# Patient Record
Sex: Male | Born: 1960 | Race: White | Hispanic: No | Marital: Married
Health system: Southern US, Community
[De-identification: ages and names within clinical notes are randomized; demographics above are authoritative.]

## PROBLEM LIST (undated history)

## (undated) DIAGNOSIS — C189 Malignant neoplasm of colon, unspecified: Secondary | ICD-10-CM

## (undated) DIAGNOSIS — E668 Other obesity: Secondary | ICD-10-CM

## (undated) DIAGNOSIS — E669 Obesity, unspecified: Secondary | ICD-10-CM

## (undated) DIAGNOSIS — M1990 Unspecified osteoarthritis, unspecified site: Secondary | ICD-10-CM

## (undated) DIAGNOSIS — I10 Essential (primary) hypertension: Secondary | ICD-10-CM

## (undated) DIAGNOSIS — K219 Gastro-esophageal reflux disease without esophagitis: Secondary | ICD-10-CM

## (undated) DIAGNOSIS — Z9289 Personal history of other medical treatment: Secondary | ICD-10-CM

## (undated) DIAGNOSIS — Z87442 Personal history of urinary calculi: Secondary | ICD-10-CM

## (undated) DIAGNOSIS — D869 Sarcoidosis, unspecified: Secondary | ICD-10-CM

## (undated) DIAGNOSIS — G629 Polyneuropathy, unspecified: Secondary | ICD-10-CM

## (undated) DIAGNOSIS — D709 Neutropenia, unspecified: Secondary | ICD-10-CM

## (undated) DIAGNOSIS — R109 Unspecified abdominal pain: Secondary | ICD-10-CM

## (undated) DIAGNOSIS — M545 Low back pain: Secondary | ICD-10-CM

## (undated) DIAGNOSIS — D500 Iron deficiency anemia secondary to blood loss (chronic): Secondary | ICD-10-CM

## (undated) DIAGNOSIS — G64 Other disorders of peripheral nervous system: Secondary | ICD-10-CM

## (undated) DIAGNOSIS — C4490 Unspecified malignant neoplasm of skin, unspecified: Secondary | ICD-10-CM

## (undated) DIAGNOSIS — Z9889 Other specified postprocedural states: Secondary | ICD-10-CM

## (undated) DIAGNOSIS — R112 Nausea with vomiting, unspecified: Secondary | ICD-10-CM

## (undated) HISTORY — DX: Other obesity: E66.8

## (undated) HISTORY — DX: Low back pain: M54.5

## (undated) HISTORY — DX: Sarcoidosis, unspecified: D86.9

## (undated) HISTORY — PX: CARDIAC CATHETERIZATION: SHX172

## (undated) HISTORY — DX: Obesity, unspecified: E66.9

## (undated) HISTORY — DX: Other disorders of peripheral nervous system: G64

---

## 2012-04-26 ENCOUNTER — Ambulatory Visit: Payer: Self-pay | Admitting: Cardiology

## 2012-10-14 DIAGNOSIS — D869 Sarcoidosis, unspecified: Secondary | ICD-10-CM

## 2012-10-14 HISTORY — DX: Sarcoidosis, unspecified: D86.9

## 2012-11-06 ENCOUNTER — Emergency Department: Payer: Self-pay | Admitting: Emergency Medicine

## 2012-11-06 LAB — COMPREHENSIVE METABOLIC PANEL
Albumin: 3.1 g/dL — ABNORMAL LOW (ref 3.4–5.0)
Anion Gap: 9 (ref 7–16)
BUN: 8 mg/dL (ref 7–18)
Bilirubin,Total: 0.7 mg/dL (ref 0.2–1.0)
Calcium, Total: 8.3 mg/dL — ABNORMAL LOW (ref 8.5–10.1)
Co2: 24 mmol/L (ref 21–32)
EGFR (African American): >60
EGFR (Non-African Amer.): >60
Glucose: 95 mg/dL (ref 65–99)
Osmolality: 276 (ref 275–301)
Potassium: 3 mmol/L — ABNORMAL LOW (ref 3.5–5.1)
SGPT (ALT): 29 U/L (ref 12–78)

## 2012-11-06 LAB — URINALYSIS, COMPLETE
Bacteria: NONE SEEN
Bilirubin,UR: NEGATIVE
Blood: NEGATIVE
Leukocyte Esterase: NEGATIVE
Protein: 30
Specific Gravity: 1.015 (ref 1.003–1.030)
Squamous Epithelial: <1
WBC UR: 3 /HPF (ref 0–5)

## 2012-11-06 LAB — CBC WITH DIFFERENTIAL/PLATELET
Basophil #: 0.1 10*3/uL (ref 0.0–0.1)
Basophil %: 0.6 %
Eosinophil #: 0.1 10*3/uL (ref 0.0–0.7)
Eosinophil %: 0.6 %
HCT: 39 % — ABNORMAL LOW (ref 40.0–52.0)
Lymphocyte #: 0.9 10*3/uL — ABNORMAL LOW (ref 1.0–3.6)
Lymphocyte %: 8.9 %
MCHC: 34.3 g/dL (ref 32.0–36.0)
MCV: 91 fL (ref 80–100)
Monocyte #: 1.1 x10 3/mm — ABNORMAL HIGH (ref 0.2–1.0)
Neutrophil %: 78.2 %
RBC: 4.28 10*6/uL — ABNORMAL LOW (ref 4.40–5.90)
RDW: 12.8 % (ref 11.5–14.5)
WBC: 9.7 10*3/uL (ref 3.8–10.6)

## 2012-11-20 ENCOUNTER — Inpatient Hospital Stay: Payer: Self-pay | Admitting: Internal Medicine

## 2012-11-20 LAB — CBC
HCT: 38.6 % — ABNORMAL LOW (ref 40.0–52.0)
MCH: 30.7 pg (ref 26.0–34.0)
MCHC: 34.1 g/dL (ref 32.0–36.0)
Platelet: 368 10*3/uL (ref 150–440)
RDW: 12.6 % (ref 11.5–14.5)
WBC: 8.7 10*3/uL (ref 3.8–10.6)

## 2012-11-20 LAB — CK TOTAL AND CKMB (NOT AT ARMC): CK-MB: <0.5 ng/mL — ABNORMAL LOW (ref 0.5–3.6)

## 2012-11-20 LAB — BASIC METABOLIC PANEL
Anion Gap: 7 (ref 7–16)
Calcium, Total: 8.1 mg/dL — ABNORMAL LOW (ref 8.5–10.1)
Chloride: 104 mmol/L (ref 98–107)
Co2: 27 mmol/L (ref 21–32)
Creatinine: 0.91 mg/dL (ref 0.60–1.30)
EGFR (Non-African Amer.): >60
Potassium: 3.2 mmol/L — ABNORMAL LOW (ref 3.5–5.1)
Sodium: 138 mmol/L (ref 136–145)

## 2012-11-20 LAB — HEPATIC FUNCTION PANEL A (ARMC)
Alkaline Phosphatase: 123 U/L (ref 50–136)
SGOT(AST): 24 U/L (ref 15–37)
SGPT (ALT): 38 U/L (ref 12–78)

## 2012-11-20 LAB — SEDIMENTATION RATE: Erythrocyte Sed Rate: 69 mm/hr — ABNORMAL HIGH (ref 0–20)

## 2012-11-29 ENCOUNTER — Ambulatory Visit: Payer: Self-pay | Admitting: Internal Medicine

## 2012-12-01 LAB — PATHOLOGY REPORT

## 2012-12-20 LAB — CULTURE, FUNGUS WITHOUT SMEAR

## 2012-12-21 DIAGNOSIS — D869 Sarcoidosis, unspecified: Secondary | ICD-10-CM | POA: Insufficient documentation

## 2012-12-21 DIAGNOSIS — E668 Other obesity: Secondary | ICD-10-CM

## 2012-12-21 DIAGNOSIS — E669 Obesity, unspecified: Secondary | ICD-10-CM

## 2012-12-21 HISTORY — DX: Sarcoidosis, unspecified: D86.9

## 2012-12-21 HISTORY — DX: Obesity, unspecified: E66.9

## 2012-12-21 HISTORY — DX: Other obesity: E66.8

## 2013-04-26 DIAGNOSIS — G4733 Obstructive sleep apnea (adult) (pediatric): Secondary | ICD-10-CM | POA: Insufficient documentation

## 2013-04-26 DIAGNOSIS — R21 Rash and other nonspecific skin eruption: Secondary | ICD-10-CM | POA: Insufficient documentation

## 2013-09-25 DIAGNOSIS — H04129 Dry eye syndrome of unspecified lacrimal gland: Secondary | ICD-10-CM | POA: Insufficient documentation

## 2014-05-28 DIAGNOSIS — M545 Low back pain, unspecified: Secondary | ICD-10-CM

## 2014-05-28 DIAGNOSIS — G64 Other disorders of peripheral nervous system: Secondary | ICD-10-CM

## 2014-05-28 HISTORY — DX: Other disorders of peripheral nervous system: G64

## 2014-05-28 HISTORY — DX: Low back pain, unspecified: M54.50

## 2014-11-28 ENCOUNTER — Inpatient Hospital Stay: Payer: Self-pay | Admitting: Surgery

## 2014-12-17 ENCOUNTER — Ambulatory Visit
Admit: 2014-12-17 | Disposition: A | Discharge status: Home / Self Care | Payer: Self-pay | Attending: Surgery | Admitting: Surgery

## 2015-01-02 ENCOUNTER — Other Ambulatory Visit
Admit: 2015-01-02 | Disposition: A | Discharge status: Home / Self Care | Payer: Self-pay | Attending: Surgery | Admitting: Surgery

## 2015-01-02 LAB — CBC WITH DIFFERENTIAL/PLATELET
BASOS ABS: 0.1 10*3/uL (ref 0.0–0.1)
Basophil %: 1 %
Eosinophil #: 0.1 10*3/uL (ref 0.0–0.7)
Eosinophil %: 0.8 %
HCT: 34.7 % — ABNORMAL LOW (ref 40.0–52.0)
HGB: 10.6 g/dL — ABNORMAL LOW (ref 13.0–18.0)
Lymphocyte #: 2.7 10*3/uL (ref 1.0–3.6)
Lymphocyte %: 20.7 %
MCH: 22.7 pg — ABNORMAL LOW (ref 26.0–34.0)
MCHC: 30.5 g/dL — ABNORMAL LOW (ref 32.0–36.0)
MCV: 75 fL — AB (ref 80–100)
MONOS PCT: 9.2 %
Monocyte #: 1.2 x10 3/mm — ABNORMAL HIGH (ref 0.2–1.0)
NEUTROS ABS: 8.9 10*3/uL — AB (ref 1.4–6.5)
Neutrophil %: 68.3 %
Platelet: 507 10*3/uL — ABNORMAL HIGH (ref 150–440)
RBC: 4.65 10*6/uL (ref 4.40–5.90)
RDW: 20.9 % — AB (ref 11.5–14.5)
WBC: 13 10*3/uL — ABNORMAL HIGH (ref 3.8–10.6)

## 2015-01-02 LAB — COMPREHENSIVE METABOLIC PANEL
ALK PHOS: 125 U/L
Albumin: 4 g/dL
Anion Gap: 7 (ref 7–16)
BILIRUBIN TOTAL: 0.5 mg/dL
BUN: 9 mg/dL
CO2: 22 mmol/L
Calcium, Total: 8.8 mg/dL — ABNORMAL LOW
Chloride: 109 mmol/L
Creatinine: 1.04 mg/dL
EGFR (African American): >60
EGFR (Non-African Amer.): >60
GLUCOSE: 110 mg/dL — AB
Potassium: 3.7 mmol/L
SGOT(AST): 39 U/L
SGPT (ALT): 55 U/L
Sodium: 138 mmol/L
TOTAL PROTEIN: 8.2 g/dL — AB

## 2015-01-03 ENCOUNTER — Ambulatory Visit
Admit: 2015-01-03 | Disposition: A | Discharge status: Home / Self Care | Payer: Self-pay | Attending: Surgery | Admitting: Surgery

## 2015-01-03 NOTE — Consult Note (Signed)
Brief Consult Note: Orders entered.   Comments: subacute illness with DOE, pulmonary infiltrates, adenopathy, fevers,violaceous nodules over ant leg and left triceps c/w e nodosum, elevated esr(neg RF and FANA)    Lymphoma vs sarcoid vs other malignancy vs chronic infection. ....Marland KitchenMarland Kitchen rec lung biopsy.  Electronic Signatures: Leeanne Mannan., Eugene Gavia (MD)  (Signed 11-Mar-14 17:08)  Authored: Brief Consult Note   Last Updated: 11-Mar-14 17:08 by Leeanne Mannan., Eugene Gavia (MD)

## 2015-01-03 NOTE — Consult Note (Signed)
PATIENT NAME:  [name], [name] MR#:  [id number] DATE OF BIRTH:  03-22-1961  DATE OF CONSULTATION:  11/21/2012  REFERRING PHYSICIAN:  Sona A. Posey Pronto, MD CONSULTING PHYSICIAN:  Emmaline Kluver., MD  REASON FOR CONSULTATION: Rule out connective tissue disease.   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 Over the last year, he has had shortness of breath and fatigue. He was seen by Dr. Kary Kos and then by Dr. Ubaldo Glassing, had catheterization which was unremarkable. He has had some dyspnea with exertion to where he has had to back off his work. He had a recent episode of fever with some cough in February, went to 104. He took amoxicillin, then Z-Pak and then Tamiflu. Around that time, he had fevers and sweats with some nausea and vomiting. He developed erythematous violaceous rash on his shin and over his left elbow. Has had even new rash since then. Went to the Emergency Room. Thought he had infection, erythema nodosum. He had a mildly elevated sedimentation rate, was seen by Dr. Kary Kos. Rheumatoid factor, ANA negative. He lost power recently. Had been taking doxycycline. Had to go to [place] so he would not be cold and developed more nausea and vomiting. Had a good deal of coughing. Developed erythema of the left eye, which ophthalmology said it was just from coughing, not an inflammatory in illness. With more dyspnea, he came to the Emergency Room where he had infiltrate on chest x-ray but on CT had adenopathy, both peripherally and centrally, with hilar adenopathy. He was given some steroid with improvement. Has not had fever since. Off of any antibiotic. Liver functions normal. Calcium down at 18.1. White count 8000, hemoglobin 13. Sedimentation rate 69. Oxygenation 95. Recent urinalysis showed 4 red cells, 3 white cells, protein of 13. He said he had his joints hurt when he had fever, but he has not really had any joint swelling. Has not had any numbness or tingling. Feels like his glands  in his axilla have been swelling.   PAST MEDICAL HISTORY: Hypertension, depression.   SOCIAL HISTORY: No recent cigarettes or alcohol.   FAMILY HISTORY: Negative for connective tissue disease.   REVIEW OF SYSTEMS: As above.   PHYSICAL EXAMINATION:  GENERAL: Pleasant male, no acute distress.  VITAL SIGNS: Blood pressure 133/81, pulse 90, temperature 97, oxygen 95 (not on oxygen).  SKIN: He has erythematous macules over the forearm. Almost looks like he has a  blister, but he has nodules that are violaceous over the left elbow, over the triceps on the extensor surface and then several on his shin down to his ankles and a cluster around his left knee, not particularly tender.  HEENT: Left sclera is mildly injected. Oropharynx clear.  LYMPHATIC: No cervical adenopathy. Fullness in the axilla. I do not feel a discrete node. LUNGS: Distant lung sounds.  HEART: No murmur.  ABDOMEN: No visceromegaly.  EXTREMITIES: No significant edema.  MUSCULOSKELETAL: Good range of motion of neck and shoulders. Hands, knees, ankles and toes without synovitis. Symmetric reflexes.   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 Differential with his constitutional symptoms of fevers, sweats and dyspnea would include lymphoma, other malignancy, chronic infection or sarcoid.   RECOMMENDATIONS: Will send ANCA level, but this is less likely a vasculitis. Agree with lung biopsy. I do not think other labs are going to help sort this out without tissue.   ____________________________ Emmaline Kluver., MD gwk:jm D: 11/21/2012 17:14:11 ET T: 11/21/2012 17:38:12 ET JOB#:  [id number]  cc: Emmaline Kluver., MD, <Dictator> Irven Easterly. Kary Kos, MD Javier Docker Ubaldo Glassing, MD Heinz Knuckles Blocker, MD Ovidio Hanger MD ELECTRONICALLY SIGNED 11/22/2012 16:40
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
## 2015-01-03 NOTE — Consult Note (Signed)
Impression:     54yo male w/o sig PMHX admitted with prolonged SOB and recent fevers, chills and malaise and probable drug rash.     He has had SOB and easy fatiguability for about a year.  His symptoms have worsened over the past 3 months.  His CT shows some LAN and pleural based infiltrates.  He has been started on steroids and is doing much better.    He developed a rash over the legs and arms after taking tamiflu and azithromycin.  This is likely a drug reaction rather than related to his pulmonary disease.    Dermatology to see him, but a biopsy of the skin would not likely provide evidence for the pulmonary process.  Will likely need a tissue diagnosis from the lung.    Chronic viral infections, such as Hep B, Hep C and HIV, could cause the LAN, fever and malaise.  His only risk factors are tattoos, but he has had HIV testing since having the tattoos.  Will send serologies.    Fungal infection would be unlikely, but he has had extensive traveling.  Will send fungal serology and Histo Ag. 6)      Sarcoidosis, rheumatologic or malignancy are more likely than infectious etiologies.    Would hold antibiotics for now.  Electronic Signatures: Blocker MPH, Heinz Knuckles (MD) (Signed on 11-Mar-14 15:40)  Authored   Last Updated: 11-Mar-14 15:55 by Blocker MPH, Heinz Knuckles (MD)

## 2015-01-03 NOTE — H&P (Signed)
PATIENT NAME:  [name], [name] MR#:  [id number] DATE OF BIRTH:  Jul 23, 1961  DATE OF ADMISSION:  11/20/2012  PRIMARY CARE PHYSICIAN:  Irven Easterly. Kary Kos, MD  CHIEF COMPLAINT:  Shortness of breath, cough and fever with sweats for the past 3 weeks.   HISTORY OF PRESENT ILLNESS:  The patient is a pleasant 54 year old morbidly obese Caucasian gentleman with significant history of hypertension not on any medications currently who comes to the Emergency Room after he started having about 3 to 4 weeks of cough which is mainly dry with occasional phlegm and increasing shortness of breath and arthralgias with body aches. He was diagnosed with possible bronchitis along with possible flu treated with Z-Pak. He completed the Z-Pak and thereafter, he started having a patchy papular rash over his lower extremities, was seen in the Emergency Room and was diagnosed "with erythema nodosum and possible pneumonia" and given a course of doxycycline. The patient continued to get worse in the form of his breathing was worsening to the point he was not able to complete his sentence without having bouts of coughing along with fever with sweats and arthralgias. He also continued to have more of the patchy rash on his lower extremities which spread to the upper extremities and came to the Emergency Room. Next in the ER, the patient's D-dimer was elevated to 1.73. His sats were in the lower 90s. He underwent CT scan of the chest which was negative for PE; however, it showed bilateral hilar bulky lymphadenopathy with bilateral patchy involvement of the lungs. His ESR was elevated to 69. He is being admitted for further evaluation for respiratory distress and bilateral hilar lymphadenopathy.   PAST MEDICAL HISTORY:   1.  Hypertension.  2.  Anxiety.   MEDICATIONS:  Systane Balance 1 drop to left eye 4 times a day.   ALLERGIES:  No known drug allergies.   FAMILY HISTORY:  Father died from massive CVA. Mother has an irregular heart  rhythm.   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 He does have some exposure to asbestos many years ago. He used to drink alcohol initially for a few years however quit and does not smoke. He lives with his wife.   REVIEW OF SYSTEMS:  CONSTITUTIONAL: Positive for fever, fatigue, weakness.  EYES: No blurred or double vision. Positive for inflammation in the left eye.  ENT: No tinnitus, ear pain, hearing loss.  RESPIRATORY: Positive for cough, shortness of breath.  CARDIOVASCULAR: No chest pain, orthopnea, edema. Positive for hypertension.  GASTROINTESTINAL: No nausea, vomiting, diarrhea, abdominal pain or GERD.  GENITOURINARY: No dysuria or hematuria.  ENDOCRINE: No polyuria, nocturia or thyroid problems.  HEMATOLOGY: No anemia or easy bruising.  SKIN: Positive for erythema nodosum on both the lower extremities and upper extremities.  MUSCULOSKELETAL: Positive for arthritis. No CVA or TIA.  PSYCHIATRIC: No anxiety or depression. All other systems reviewed and are negative.   PHYSICAL EXAMINATION:   GENERAL: The patient is awake, alert, oriented x 3 not in acute distress. He is morbidly obese.  VITAL SIGNS: Afebrile, pulse 76, blood pressure 145/80 and sats 94% on room air.  HEENT: Atraumatic, normocephalic. PERRLA. The patient has left eye conjunctival injection. No eye discharge noted. In the oral mucosa, no ulcers noted.  NECK: Supple. No JVD. No carotid bruit.  LUNGS: Clear to auscultation bilaterally. No rales, rhonchi, respiratory distress or labored breathing. There are decreased breath sounds in the bases.  ABDOMEN: Obese, soft,  nontender. No organomegaly. Positive bowel sounds.  NEUROLOGIC: Cranial nerves II through XII grossly intact. No motor or sensory deficit.  SKIN: Warm and dry. The patient does have erythema nodosum on both lower extremities and a few patches on the upper extremities.  PSYCHIATRIC: The patient is awake, alert, oriented x  3.   DIAGNOSTIC DATA:  CT of the lungs shows bilateral bulky hilar lymphadenopathy. There is no evidence of PE. There are patchy alveolar densities of both the lungs which may reflect infectious or inflammatory process. Cardiac enzymes first set negative. CBC is within normal limits. Basic metabolic panel is within normal limits except for potassium of 3.2. ESR is 69. D-dimer is 1.73. LFTs are within normal limits.   ASSESSMENT:   33.  A 55 year old male with history of hypertension who comes in with increasing shortness of breath, fever with sweats, cough, arthralgia, erythema nodosum, elevated ESR and CT evidence of bilateral bulky hilar/mediastinal lymphadenopathy that appears to be due to sarcoidosis. Other causes of bulky hilar lymphadenopathy need to be ruled out. We will admit the patient to the medical floor. We will check ACE level. The case was discussed with Dr. Mortimer Fries. Consider getting biopsy from erythema nodosum lesion. Again, sensitivity of it may not be much specific; however, we will get a dermatology consult. The patient may end up getting a lymph node biopsy. We will start prednisone. Oxygen if needed.  2.  History of hypertension. Monitor the patient's blood pressure. He is currently not on any medications at home. If it remains elevated, consider adding blood pressure meds.  3.  Morbid obesity.  4.  Deep vein thrombosis prophylaxis. We will give subcutaneous heparin.  5.  Further workup according to the patient's clinical course. Hospital admission plan was discussed with patient, the patient's wife and Dr. Mortimer Fries.   TIME SPENT:  45 minutes.    ____________________________ Hart Rochester Posey Pronto, MD sap:si D: 11/20/2012 21:02:00 ET T: 11/20/2012 22:20:48 ET JOB#: [id number]  cc: Irven Easterly. Kary Kos, MD Sona A. Posey Pronto, MD, <Dictator> Mariane Duval, MD   Ilda Basset MD ELECTRONICALLY SIGNED 11/23/2012 15:09

## 2015-01-03 NOTE — Discharge Summary (Signed)
PATIENT NAME:  [name], [name] MR#:  [id number] DATE OF BIRTH:  1961-05-29  DATE OF ADMISSION:  11/20/2012 DATE OF DISCHARGE:  11/22/2012  PRESENTING COMPLAINT: Shortness of breath and fever with arthralgias.   DISCHARGE DIAGNOSES:  1.  Bilateral hilar/mediastinal bulky lymphadenopathy with fever, arthralgias and erythema nodosum. Workup in progress. The patient needs further workup as an outpatient.  2.  Hypertension.  3.  Morbid obesity.   CONDITION ON DISCHARGE: Fair. Sats 92% on room air.   PROCEDURES: Skin biopsy done by Dr. Evorn Gong in dermatology, not suggestive of sarcoidosis.   CODE STATUS: Full code.   MEDICATIONS:  1.  Systane 1 drop to the left eye 4 times a day.  2.  Prednisone 60 mg daily.  3.  Amlodipine 5 mg daily.  4.  Ambien 10 mg at bedtime.   DIET: Low sodium, regular.   FOLLOWUP:  1.  Dr. Kary Kos 1 to 2 weeks.  2.  Dr. Mortimer Fries to set up outpatient bronchoscopy with hilar lymph node biopsy. The patient recommended to get in touch with Dr. Mortimer Fries if not contacted in the next few days.    CONSULTATIONS:  1.  Rheumatology, Dr. Jefm Bryant.  2.  Dr. Clayborn Bigness, infectious disease.  3.  Dermatology, Dr. Evorn Gong.  4.  Dr. Mortimer Fries, pulmonology.   LABS AT DISCHARGE: HBsAg is negative. ACE level74 units/L. Cardiac enzymes negative. CBC within normal limits. Basic metabolic panel within normal limits except potassium of 3.2. ESR of 69. D-dimer was 1.73. CT of the chest shows there is no evidence of PE. No acute thoracic aortic pathology. There is bulky mediastinal hilar and hilar and retrosternal lymphadenopathy. Patchy alveolar densities in both the lungs, which may reflect infectious vs inflammatory process. No pleural pericardial effusion.   The patient is a 54 year old, morbidly obese, Caucasian gentleman with history of hypertension, not on any medications, came to the Emergency Room with:  1.  Increasing shortness of breath, fever, arthralgias and bilateral lower extremity patchy  rash consistent with erythema nodosum. He was found to have elevated ESR. The patient underwent CT scan of the chest, which showed bilateral bulky hilar/mediastinal lymphadenopathy. Differential diagnosis was sarcoidosis and need to rule out lymphoma or other chronic infectious process. The patient received a dose of IV Solu-Medrol and then started on p.o. prednisone. He started feeling better. The patient underwent skin biopsy, which was done by Dr. Evorn Gong and was not suggestive of sarcoidosis .The patient was seen by Dr. Jefm Bryant, Dr. Clayborn Bigness and Dr. Mortimer Fries and recommendations were to get tissue diagnosis from the hilar lymph node. The patient was scheduled to get lymph node biopsy by bronchoscopy by Dr. Mortimer Fries on 03/13; however, the patient refused to get the biopsy done and wanted to do it at a later date as an outpatient. The patient will be informed of outpatient biopsy. I also recommended the patient to get in touch with Dr. Kary Kos, to get the outpatient biopsy done by bronchoscopy by pulmonology.  2.  History of hypertension. Blood pressure remained somewhat elevated, likely could be contributed because of steroids and the patient's anxious personality. P.o. Norvasc was started.  3.  Morbid obesity. Advised weight reduction.  4.  Deep vein thrombosis prophylaxis. The patient was on subcutaneous heparin. Hospital stay otherwise remained stable.   CODE STATUS: The patient remained a full code.   TIME SPENT: 40 minutes.   ____________________________ Hart Rochester Posey Pronto, MD sap:aw D: 11/23/2012 06:41:33 ET T: 11/23/2012 07:26:06 ET JOB#: [id number]  cc: Sona A. Posey Pronto,  MD, <Dictator> Ilda Basset MD ELECTRONICALLY SIGNED 11/23/2012 15:13

## 2015-01-10 ENCOUNTER — Ambulatory Visit: Admit: 2015-01-10 | Disposition: A | Discharge status: Home / Self Care | Payer: Self-pay | Admitting: Surgery

## 2015-01-12 NOTE — Discharge Summary (Signed)
PATIENT NAME:  [name], [name] MR#:  [id number] DATE OF BIRTH:  20-Jul-1961  DATE OF ADMISSION:  11/28/2014 DATE OF DISCHARGE:  12/04/2014  FINAL DIAGNOSES:  1.  Complicated perforated descending colon diverticulitis.  2.  Morbid obesity.  3.  Sarcoidosis.   PRINCIPAL PROCEDURES:  1.  CT scan of the abdomen.  2.  Sterile abdominal examination. 3.  Intravenous antibiotics.   HOSPITAL COURSE SUMMARY: The patient was admitted with significant pain, leukocytosis, and descending colon diverticulitis. Over the course of his hospitalization, he did have slow improvement. He was treated nonoperatively. He did have resolution of his colonic ileus. On the 21st, the patient continued to have some discomfort. Overall he was doing better. His white count had fallen to 12.8. On the 22nd, the patient had a bowel movement, felt like he was making progress and pain control. He was stable for discharge on the 23rd of March on course of Keflex and Flagyl for a total of 7 more days. He will follow up in the office in 1 to 2 weeks' time. He understands that he will need a colonoscopy in the future. Call with any questions or concerns. Medication reconciliation form was performed.   ____________________________ Jeannette How. Marina Gravel, MD mab:sb D: 12/05/2014 08:48:50 ET T: 12/05/2014 14:15:34 ET JOB#: [id number]  cc: Elta Guadeloupe A. Marina Gravel, MD, <Dictator> Hortencia Conradi MD ELECTRONICALLY SIGNED 12/09/2014 23:17

## 2015-01-12 NOTE — H&P (Signed)
PATIENT NAME:  [name], PAULE MR#:  [id number] DATE OF BIRTH:  01/31/1961  DATE OF ADMISSION:  11/28/2014  ADMITTING DIAGNOSES:  1. Microperforation, sigmoid diverticulitis.  2. Obesity.  3. Hypertension.  4. Sarcoidosis.   HISTORY: This is a 54 year old morbidly obese white male with a history of sarcoidosis and hypertension, who presents with a several-day history of right flank pain migrating now into his abdomen. He initially thought he had kidney stones, for which he has a history of. Workup in the Emergency Room was suggestive more of diverticulitis with microperforation seen on CT scan.   The patient denies any type of abdominal pain like this in the past. He had no nausea, no vomiting. No diarrhea. No bloody stools. No fever. No jaundice. No previous intra-abdominal operations.   ALLERGIES: FENTANYL AND MIDAZOLAM.    MEDICATIONS: Lisinopril 10 mg by mouth once a day, Lyrica 150 mg by mouth 1 cap b.i.d., omeprazole 40 mg by mouth once a day, Ambien 10 mg by mouth once a day at bedtime.   PAST MEDICAL HISTORY: Significant for hypertension, sarcoidosis, diverticulosis, kidney stones, obesity, BMI of 46.8. He receives his care for sarcoidosis at [facility].   SOCIAL HISTORY: The patient does not smoke. Does not drink. He is unemployed secondary to sarcoid and peripheral nerve problems.   FAMILY HISTORY: Significant for hypertension.   PHYSICAL EXAMINATION:  VITAL SIGNS: Temperature is 97.9, pulse 113, blood pressure is 137/88, pulse oximetry is 94% on room air.  GENERAL:  The patient is alert and oriented, in no obvious distress.  PSYCHIATRIC: Affect is normal. Facies are symmetrical. Neck is obese.  LUNGS: Clear bilaterally.  HEART: Regular rate and rhythm.  GASTROINTESTINAL: Abdomen is protuberant, soft with mild tenderness in the left lower quadrant and periumbilical region. He is also tender somewhat in the epigastrium.  EXTREMITIES: Warm and well perfused.  NEUROLOGIC  EXAMINATION: Normal.    LABORATORY VALUES: Reviewed. White count is 25.3, hemoglobin 9.7, hematocrit 31.4, platelet count 130,000. Liver function tests unremarkable. Albumin 3.0. Electrolytes are unremarkable. Glucose is 146, calcium is 8.2, lipase 46., potassium 3.7.   I personally reviewed the CT scan images on the PACS monitor. Non-contrasted CT scan was performed, which demonstrates colitis involving the distal descending colon with evidence of microperforations, suspected to be diverticular in nature. No renal stones were seen.   IMPRESSION: This is a 54 year old obese white male with hypertension and sarcoidosis, presenting with microperforation of sigmoid diverticulitis. I see no indication for acute surgical intervention at this time.  PLAN: The patient will be admitted, hydrated, started on intravenous antibiotics consisting of Rocephin and Flagyl. He will be made n.p.o. Serial abdominal examinations will undertaken. He understands the risk of needing operation, as well as ostomy, which be very difficult given his [place] appearance.  I will continue his antihypertensive po.   ____________________________ Jeannette How. Marina Gravel, MD mab:ap D: 11/29/2014 08:57:31 ET T: 11/29/2014 09:16:28 ET JOB#: [id number]  cc: Elta Guadeloupe A. Marina Gravel, MD, <Dictator> Hortencia Conradi MD ELECTRONICALLY SIGNED 11/29/2014 12:58

## 2015-01-13 ENCOUNTER — Inpatient Hospital Stay
Admission: EM | Admit: 2015-01-13 | Discharge: 2015-01-16 | DRG: 391 | Disposition: A | Discharge status: Home / Self Care | Payer: BLUE CROSS/BLUE SHIELD | Attending: Surgery | Admitting: Surgery

## 2015-01-13 ENCOUNTER — Inpatient Hospital Stay: Payer: BLUE CROSS/BLUE SHIELD

## 2015-01-13 ENCOUNTER — Encounter: Payer: Self-pay | Admitting: Emergency Medicine

## 2015-01-13 DIAGNOSIS — K651 Peritoneal abscess: Secondary | ICD-10-CM | POA: Diagnosis present

## 2015-01-13 DIAGNOSIS — Z823 Family history of stroke: Secondary | ICD-10-CM

## 2015-01-13 DIAGNOSIS — G629 Polyneuropathy, unspecified: Secondary | ICD-10-CM | POA: Diagnosis present

## 2015-01-13 DIAGNOSIS — D869 Sarcoidosis, unspecified: Secondary | ICD-10-CM | POA: Diagnosis present

## 2015-01-13 DIAGNOSIS — K5732 Diverticulitis of large intestine without perforation or abscess without bleeding: Principal | ICD-10-CM | POA: Diagnosis present

## 2015-01-13 DIAGNOSIS — G4733 Obstructive sleep apnea (adult) (pediatric): Secondary | ICD-10-CM | POA: Diagnosis present

## 2015-01-13 DIAGNOSIS — R109 Unspecified abdominal pain: Secondary | ICD-10-CM

## 2015-01-13 DIAGNOSIS — N200 Calculus of kidney: Secondary | ICD-10-CM | POA: Diagnosis present

## 2015-01-13 DIAGNOSIS — I10 Essential (primary) hypertension: Secondary | ICD-10-CM | POA: Diagnosis present

## 2015-01-13 DIAGNOSIS — K5720 Diverticulitis of large intestine with perforation and abscess without bleeding: Secondary | ICD-10-CM

## 2015-01-13 DIAGNOSIS — Z79899 Other long term (current) drug therapy: Secondary | ICD-10-CM

## 2015-01-13 DIAGNOSIS — Z452 Encounter for adjustment and management of vascular access device: Secondary | ICD-10-CM

## 2015-01-13 HISTORY — DX: Sarcoidosis, unspecified: D86.9

## 2015-01-13 HISTORY — DX: Essential (primary) hypertension: I10

## 2015-01-13 HISTORY — DX: Polyneuropathy, unspecified: G62.9

## 2015-01-13 LAB — CBC WITH DIFFERENTIAL/PLATELET
Basophils Absolute: 0.1 10*3/uL (ref 0–0.1)
Eosinophils Absolute: 0.1 10*3/uL (ref 0–0.7)
HCT: 32.4 % — ABNORMAL LOW (ref 40.0–52.0)
Hemoglobin: 10 g/dL — ABNORMAL LOW (ref 13.0–18.0)
Lymphs Abs: 1.8 10*3/uL (ref 1.0–3.6)
MCH: 22.6 pg — AB (ref 26.0–34.0)
MCHC: 31 g/dL — ABNORMAL LOW (ref 32.0–36.0)
MCV: 73 fL — ABNORMAL LOW (ref 80.0–100.0)
Monocytes Absolute: 1 10*3/uL (ref 0.2–1.0)
Monocytes Relative: 9 %
NEUTROS ABS: 9 10*3/uL — AB (ref 1.4–6.5)
Platelets: 478 10*3/uL — ABNORMAL HIGH (ref 150–440)
RBC: 4.44 MIL/uL (ref 4.40–5.90)
RDW: 20.5 % — ABNORMAL HIGH (ref 11.5–14.5)
WBC: 11.9 10*3/uL — ABNORMAL HIGH (ref 3.8–10.6)

## 2015-01-13 LAB — COMPREHENSIVE METABOLIC PANEL
ALT: 40 U/L (ref 17–63)
ANION GAP: 7 (ref 5–15)
AST: 34 U/L (ref 15–41)
Albumin: 3.8 g/dL (ref 3.5–5.0)
Alkaline Phosphatase: 101 U/L (ref 38–126)
BUN: 9 mg/dL (ref 6–20)
CHLORIDE: 107 mmol/L (ref 101–111)
CO2: 24 mmol/L (ref 22–32)
CREATININE: 0.89 mg/dL (ref 0.61–1.24)
Calcium: 8.7 mg/dL — ABNORMAL LOW (ref 8.9–10.3)
GFR calc Af Amer: >60 mL/min (ref >60)
GFR calc non Af Amer: >60 mL/min (ref >60)
Glucose, Bld: 106 mg/dL — ABNORMAL HIGH (ref 65–99)
Potassium: 3.8 mmol/L (ref 3.5–5.1)
Sodium: 138 mmol/L (ref 135–145)
TOTAL PROTEIN: 7.9 g/dL (ref 6.5–8.1)
Total Bilirubin: 0.4 mg/dL (ref 0.3–1.2)

## 2015-01-13 LAB — URINALYSIS COMPLETE WITH MICROSCOPIC (ARMC ONLY)
BILIRUBIN URINE: NEGATIVE
Bacteria, UA: NONE SEEN
GLUCOSE, UA: NEGATIVE mg/dL
HGB URINE DIPSTICK: NEGATIVE
KETONES UR: NEGATIVE mg/dL
NITRITE: NEGATIVE
PH: 6 (ref 5.0–8.0)
Protein, ur: NEGATIVE mg/dL
Specific Gravity, Urine: 1.017 (ref 1.005–1.030)

## 2015-01-13 LAB — CBC
HCT: 31.3 % — ABNORMAL LOW (ref 40.0–52.0)
HEMOGLOBIN: 9.7 g/dL — AB (ref 13.0–18.0)
MCH: 22.8 pg — AB (ref 26.0–34.0)
MCHC: 31 g/dL — ABNORMAL LOW (ref 32.0–36.0)
MCV: 73.5 fL — ABNORMAL LOW (ref 80.0–100.0)
Platelets: 452 10*3/uL — ABNORMAL HIGH (ref 150–440)
RBC: 4.26 MIL/uL — ABNORMAL LOW (ref 4.40–5.90)
RDW: 20.6 % — AB (ref 11.5–14.5)
WBC: 13.2 10*3/uL — ABNORMAL HIGH (ref 3.8–10.6)

## 2015-01-13 LAB — LIPASE, BLOOD: Lipase: 28 U/L (ref 22–51)

## 2015-01-13 LAB — CREATININE, SERUM
CREATININE: 0.94 mg/dL (ref 0.61–1.24)
GFR calc Af Amer: >60 mL/min (ref >60)

## 2015-01-13 MED ORDER — MORPHINE SULFATE 2 MG/ML IJ SOLN
2.0000 mg | INTRAMUSCULAR | Status: DC | PRN
  Administered 2015-01-13: 4 mg via INTRAVENOUS
  Filled 2015-01-13: qty 2

## 2015-01-13 MED ORDER — IOHEXOL 240 MG/ML SOLN
25.0000 mL | INTRAMUSCULAR | Status: AC
  Administered 2015-01-13 (×2): 25 mL via ORAL

## 2015-01-13 MED ORDER — HYDROMORPHONE HCL 1 MG/ML IJ SOLN
1.0000 mg | Freq: Once | INTRAMUSCULAR | Status: AC
  Administered 2015-01-13: 1 mg via INTRAVENOUS

## 2015-01-13 MED ORDER — IOHEXOL 300 MG/ML  SOLN
125.0000 mL | Freq: Once | INTRAMUSCULAR | Status: AC | PRN
  Administered 2015-01-13: 125 mL via INTRAVENOUS

## 2015-01-13 MED ORDER — ONDANSETRON HCL 4 MG/2ML IJ SOLN: 4.0000 mg | Freq: Four times a day (QID) | INTRAMUSCULAR | Status: DC | PRN

## 2015-01-13 MED ORDER — ENOXAPARIN SODIUM 40 MG/0.4ML ~~LOC~~ SOLN
40.0000 mg | SUBCUTANEOUS | Status: DC
  Administered 2015-01-13 – 2015-01-15 (×3): 40 mg via SUBCUTANEOUS
  Filled 2015-01-13 (×3): qty 0.4

## 2015-01-13 MED ORDER — HYDROMORPHONE HCL 1 MG/ML IJ SOLN
INTRAMUSCULAR | Status: AC
  Filled 2015-01-13: qty 1

## 2015-01-13 MED ORDER — KCL IN DEXTROSE-NACL 20-5-0.45 MEQ/L-%-% IV SOLN
INTRAVENOUS | Status: DC
  Administered 2015-01-13 – 2015-01-15 (×5): via INTRAVENOUS
  Filled 2015-01-13 (×12): qty 1000

## 2015-01-13 MED ORDER — ZOLPIDEM TARTRATE 5 MG PO TABS
10.0000 mg | ORAL_TABLET | Freq: Every evening | ORAL | Status: DC | PRN
  Administered 2015-01-13 – 2015-01-15 (×3): 10 mg via ORAL
  Filled 2015-01-13 (×3): qty 2

## 2015-01-13 MED ORDER — CLINDAMYCIN PHOSPHATE 600 MG/50ML IV SOLN
600.0000 mg | Freq: Three times a day (TID) | INTRAVENOUS | Status: DC
  Administered 2015-01-13 – 2015-01-16 (×8): 600 mg via INTRAVENOUS
  Filled 2015-01-13 (×12): qty 50

## 2015-01-13 MED ORDER — ONDANSETRON HCL 4 MG PO TABS: 4.0000 mg | ORAL_TABLET | Freq: Four times a day (QID) | ORAL | Status: DC | PRN

## 2015-01-13 MED ORDER — CEFTRIAXONE SODIUM IN DEXTROSE 20 MG/ML IV SOLN
1.0000 g | INTRAVENOUS | Status: DC
  Administered 2015-01-13 – 2015-01-15 (×3): 1 g via INTRAVENOUS
  Filled 2015-01-13 (×5): qty 50

## 2015-01-13 NOTE — H&P (Signed)
H&P dictated Admit for IV ABx and repeat CT scan

## 2015-01-13 NOTE — ED Provider Notes (Signed)
[facility] Emergency Department Provider Note    ____________________________________________  Time seen: Time is 1400 p.m.   HISTORY  Chief Complaint Diverticulitis       HPI [name] is a 54 y.o. male who presents with right-sided abdominal pain. Patient states she was told to come here by Dr. Felton Clinton. So he could be admitted and have IV antibiotics. He points a dull right-sided abdominal pain. But most of his abdomen is tender to touch he states. Day 8 of antibiotics and has bruising be treated with antibiotics as well for this. Movement coughing or pressure in the abdomen makes it worse. Pain is severe.     Past Medical History  Diagnosis Date  . Hypertension   . Sarcoid   . Neuropathy     There are no active problems to display for this patient.   No past surgical history on file.  Current Outpatient Rx  Name  Route  Sig  Dispense  Refill  . ciprofloxacin (CIPRO) 500 MG tablet   Oral   Take 500 mg by mouth 2 (two) times daily.         . metroNIDAZOLE (FLAGYL) 500 MG tablet   Oral   Take 500 mg by mouth 3 (three) times daily.         Marland Kitchen oxyCODONE-acetaminophen (PERCOCET/ROXICET) 5-325 MG per tablet   Oral   Take 1 tablet by mouth every 4 (four) hours as needed for severe pain.         Marland Kitchen zolpidem (AMBIEN) 10 MG tablet   Oral   Take 10 mg by mouth at bedtime as needed for sleep.           Allergies Review of patient's allergies indicates not on file.  History reviewed. No pertinent family history.  Social History History  Substance Use Topics  . Smoking status: Never Smoker   . Smokeless tobacco: Not on file  . Alcohol Use: No    Review of Systems  Constitutional: Negative for fever. Eyes: Negative for visual changes. ENT: Negative for sore throat. Cardiovascular: Negative for chest pain. Respiratory: Negative for shortness of breath. Gastrointestinal: Positive for abdominal pain, negative for vomiting and  diarrhea. Genitourinary: Negative for dysuria. Musculoskeletal: Negative for back pain. Skin: Negative for rash. Neurological: Negative for headaches, focal weakness or numbness. {  10-point ROS otherwise negative.  ____________________________________________   PHYSICAL EXAM:  VITAL SIGNS: ED Triage Vitals  Enc Vitals Group     BP 01/13/15 1030 132/84 mmHg     Pulse Rate 01/13/15 1030 98     Resp 01/13/15 1030 18     Temp 01/13/15 1030 98.4 F (36.9 C)     Temp Source 01/13/15 1030 Oral     SpO2 01/13/15 1030 97 %     Weight 01/13/15 1030 320 lb (145.151 kg)     Height 01/13/15 1030 5\' 11"  (1.803 m)     Head Cir --      Peak Flow --      Pain Score 01/13/15 1031 4     Pain Loc --      Pain Edu? --      Excl. in [place]? --      Constitutional: Alert and oriented. Well appearing and in no distress. Eyes: Conjunctivae are normal. PERRL. Normal extraocular movements. ENT   Head: Normocephalic and atraumatic.   Nose: No congestion/rhinnorhea.   Mouth/Throat: Mucous membranes are moist.   Neck: No stridor. Hematological/Lymphatic/Immunilogical: No cervical lymphadenopathy. Cardiovascular:  Normal rate, regular rhythm. Normal and symmetric distal pulses are present in all extremities. No murmurs, rubs, or gallops. Respiratory: Normal respiratory effort without tachypnea nor retractions. Breath sounds are clear and equal bilaterally. No wheezes/rales/rhonchi. Gastrointestinal: Right-sided abdominal tenderness with possibly some guarding there. Bowel sounds  Musculoskeletal: Nontender with normal range of motion in all extremities. No joint effusions.  No lower extremity tenderness nor edema. Neurologic:  Normal speech and language. No gross focal neurologic deficits are appreciated. Speech is normal. No gait instability. Skin:  Skin is warm, dry and intact. No rash noted. Psychiatric: Mood and affect are normal. Speech and behavior are normal. Patient exhibits  appropriate insight and judgment.  ____________________________________________   EKG  ED ECG REPORT   Date: 01/13/2015  EKG Time: 10:52 AM  Rate: 113  Rhythm: normal EKG, normal sinus rhythm, unchanged from previous tracings, sinus tachycardia  Axis: Normal  Intervals:none  ST&T Change: None   ____________________________________________    RADIOLOGY  None  ____________________________________________   PROCEDURES  Procedure(s) performed: None  Critical Care performed: No  ____________________________________________   INITIAL IMPRESSION / ASSESSMENT AND PLAN / ED COURSE  Pertinent labs & imaging results that were available during my care of the patient were reviewed by me and considered in my medical decision making (see chart for details).    Right-sided diverticulitis. Plan will be for consultation of surgery. Spoke with Dr. Leanora Cover on the phone who is agreed to come evaluate the patient ER. Will need admission  ____________________________________________   FINAL CLINICAL IMPRESSION(S) / ED DIAGNOSES  diverticulitis  Earleen Newport, MD 01/13/15 [id number]

## 2015-01-13 NOTE — ED Notes (Signed)
Pt reports that he was suppose to be admitted this weekend for diverticulitis but was to sick to come in, but is here to be admitted today. Pt appears pale.

## 2015-01-13 NOTE — ED Notes (Signed)
Diverticulitis.  Being txby dr bird.  Says he was told to come to ED so they can admit him for IV antibiotics.

## 2015-01-14 LAB — CBC
HCT: 28.2 % — ABNORMAL LOW (ref 40.0–52.0)
HEMOGLOBIN: 9.1 g/dL — AB (ref 13.0–18.0)
MCH: 23.7 pg — ABNORMAL LOW (ref 26.0–34.0)
MCHC: 32.2 g/dL (ref 32.0–36.0)
MCV: 73.4 fL — ABNORMAL LOW (ref 80.0–100.0)
Platelets: 407 10*3/uL (ref 150–440)
RBC: 3.84 MIL/uL — AB (ref 4.40–5.90)
RDW: 21 % — ABNORMAL HIGH (ref 11.5–14.5)
WBC: 9.8 10*3/uL (ref 3.8–10.6)

## 2015-01-14 LAB — COMPREHENSIVE METABOLIC PANEL
ALBUMIN: 3.3 g/dL — AB (ref 3.5–5.0)
ALK PHOS: 80 U/L (ref 38–126)
ALT: 33 U/L (ref 17–63)
AST: 27 U/L (ref 15–41)
Anion gap: 6 (ref 5–15)
BUN: 6 mg/dL (ref 6–20)
CHLORIDE: 108 mmol/L (ref 101–111)
CO2: 25 mmol/L (ref 22–32)
Calcium: 8.3 mg/dL — ABNORMAL LOW (ref 8.9–10.3)
Creatinine, Ser: 0.8 mg/dL (ref 0.61–1.24)
GFR calc Af Amer: >60 mL/min (ref >60)
Glucose, Bld: 115 mg/dL — ABNORMAL HIGH (ref 65–99)
Potassium: 3.7 mmol/L (ref 3.5–5.1)
SODIUM: 139 mmol/L (ref 135–145)
Total Bilirubin: 0.3 mg/dL (ref 0.3–1.2)
Total Protein: 7 g/dL (ref 6.5–8.1)

## 2015-01-14 LAB — PROTIME-INR
INR: 1.2
PROTHROMBIN TIME: 15.4 s — AB (ref 11.4–15.0)

## 2015-01-14 LAB — APTT: aPTT: 34 seconds (ref 24–36)

## 2015-01-14 MED ORDER — ACETAMINOPHEN 325 MG PO TABS
650.0000 mg | ORAL_TABLET | Freq: Four times a day (QID) | ORAL | Status: DC | PRN
  Administered 2015-01-14 – 2015-01-16 (×5): 650 mg via ORAL
  Filled 2015-01-14 (×4): qty 2

## 2015-01-14 MED ORDER — HYDRALAZINE HCL 20 MG/ML IJ SOLN: 10.0000 mg | Freq: Four times a day (QID) | INTRAMUSCULAR | Status: DC | PRN

## 2015-01-14 MED ORDER — METOPROLOL TARTRATE 25 MG PO TABS
25.0000 mg | ORAL_TABLET | Freq: Two times a day (BID) | ORAL | Status: DC
  Administered 2015-01-14: 25 mg via ORAL
  Filled 2015-01-14: qty 1

## 2015-01-14 NOTE — Progress Notes (Signed)
In urinal

## 2015-01-14 NOTE — Progress Notes (Signed)
Pt refused to be awoke for VS at night

## 2015-01-14 NOTE — H&P (Signed)
NAME:  [name], [name] NO.:  [id number]  MEDICAL RECORD NO.:  [id number]  LOCATION:  222A                         FACILITY:  [facility]  PHYSICIAN:  Molly Maduro, MD   DATE OF BIRTH:  March 16, 1961  DATE OF ADMISSION:  01/13/2015 DATE OF DISCHARGE:                            HISTORY AND PHYSICAL   HISTORY OF PRESENT ILLNESS:  [name] is a 54 year old white male who has had recurrent and/or persistent acute diverticulitis associated with small intraabdominal abscesses for a number of weeks.  He has been in and out of the hospital and in and out of the [facility] and has been advised to be admitted to the hospital last Thursday.  He took it upon himself not to take the advice of the physicians but come into the hospital on Monday instead.  Apparently, he has a CT scan which shows a pancake abscess in the right upper quadrant that is fairly thin but has enlarged in size.  Since being at home he has constant abdominal pain but his pain improves at times, and he has had a low grade fever up to 100.5 degrees Fahrenheit.  He does not have any nausea or vomiting or anorexia and he has 2 loose bowel movements per day typically.  PAST MEDICAL HISTORY: 1. Morbid obesity. 2. Sarcoidosis. 3. Hypertension.  MEDICATIONS: 1. Systane 1 drop to left eye 4 times a day. 2. Prednisone 60 mg daily. 3. Amlodipine 5 mg daily. 4. Ambien 10 mg q.h.s.  FAMILY HISTORY:  The patient's father died in his 31s of a stroke.  The patient's mother is in her mid 91s and recently had a back fracture and is now bedridden.  SOCIAL HISTORY:  The patient is disabled, he says because of 3 or 4 different problems.  He does not smoke cigarettes and never has and he does not drink alcohol.  He is married.  PHYSICAL EXAMINATION:  GENERAL:  Morbidly obese patient lying comfortably in the emergency department stretcher. VITAL SIGNS:  Temperature 98.4, pulse 94, respirations 16, blood pressure  148/79, oxygen saturation 98% on room air, weight 145 kg (320 pounds). HEENT:  Pupils equally round, reactive to light.  Extraocular movements intact.  Sclerae nonicteric.  Oropharynx clear.  Mucous membranes moist. Hearing intact to voice. NECK:  Supple with no lymphadenopathy, tracheal deviation or jugular venous distention. HEART:  Regular rate and rhythm with normal heart sounds bilaterally and no murmurs or rubs. LUNGS:  Clear to auscultation bilaterally with normal respiratory effort. ABDOMEN: Obese but nondistended and he has some tenderness on the right side particularly the right lower quadrant where he has mild rebound peritonitis.  On the left, there is no rebound.  There is no tympany. EXTREMITIES:  No edema with normal capillary refill bilaterally. NEUROLOGICAL: Cranial nerves II through XII.  Motor and sensation grossly intact. PSYCHIATRIC:  Alert and oriented x4.  Appropriate affect.  OTHER STUDIES:  White blood cell count 11.9, hemoglobin 10, hematocrit 32.4%, platelet count 478,000.  Glucose is 106.  Creatinine 0.9.  ASSESSMENT:  Recurrent or persistent diverticulitis over the course of the last 2 months or so.  PLAN:  Admit to the  hospital for IV fluids and IV antimitotics, CT scan of the abdomen and pelvis with and without IV and oral contrast.          ______________________________ Molly Maduro, MD     WM/MEDQ  D:  01/13/2015  T:  01/14/2015  Job:  [id number]

## 2015-01-14 NOTE — Progress Notes (Addendum)
Subjective: Feels better Hungry   Vital signs in last 24 hours: Temp:  [97.7 F (36.5 C)-98.5 F (36.9 C)] 98.2 F (36.8 C) (05/03 0829) Pulse Rate:  [73-94] 73 (05/03 0829) Resp:  [16-22] 19 (05/03 0829) BP: (148-170)/(60-105) 170/105 mmHg (05/03 0829) SpO2:  [98 %-100 %] 99 % (05/03 0829)    Intake/Output from previous day: 05/02 0701 - 05/03 0700 In: 1129 [IV Piggyback:1129] Out: 1000 [Urine:1000]  PERTINENT EXAM: Abdomen still tender in RLQ, but perhaps less so  Lab Results:  CBC  Recent Labs  01/13/15 1801 01/14/15 0604  WBC 13.2* 9.8  HGB 9.7* 9.1*  HCT 31.3* 28.2*  PLT 452* 407   CMP     Component Value Date/Time   NA 139 01/14/2015 0604   NA 138 01/02/2015 1555   K 3.7 01/14/2015 0604   K 3.7 01/02/2015 1555   CL 108 01/14/2015 0604   CL 109 01/02/2015 1555   CO2 25 01/14/2015 0604   CO2 22 01/02/2015 1555   GLUCOSE 115* 01/14/2015 0604   GLUCOSE 110* 01/02/2015 1555   BUN 6 01/14/2015 0604   BUN 9 01/02/2015 1555   CREATININE 0.80 01/14/2015 0604   CREATININE 1.04 01/02/2015 1555   CALCIUM 8.3* 01/14/2015 0604   CALCIUM 8.8* 01/02/2015 1555   PROT 7.0 01/14/2015 0604   PROT 8.2* 01/02/2015 1555   ALBUMIN 3.3* 01/14/2015 0604   ALBUMIN 4.0 01/02/2015 1555   AST 27 01/14/2015 0604   AST 39 01/02/2015 1555   ALT 33 01/14/2015 0604   ALT 55 01/02/2015 1555   ALKPHOS 80 01/14/2015 0604   ALKPHOS 125 01/02/2015 1555   BILITOT 0.3 01/14/2015 0604   GFRNONAA >60 01/14/2015 0604   GFRNONAA >60 01/02/2015 1555   GFRAA >60 01/14/2015 0604   GFRAA >60 01/02/2015 1555   PT/INR  Recent Labs  01/14/15 0604  LABPROT 15.4*  INR 1.20    Studies/Results: Ct Abdomen Pelvis W Contrast  01/13/2015   CLINICAL DATA:  Abdominal pain, right upper quadrant. Elevated white count. Followup CT 01/03/2015  EXAM: CT ABDOMEN AND PELVIS WITH CONTRAST  TECHNIQUE: Multidetector CT imaging of the abdomen and pelvis was performed using the standard protocol  following bolus administration of intravenous contrast.  CONTRAST:  122mL OMNIPAQUE IOHEXOL 300 MG/ML  SOLN  COMPARISON:  01/03/2015  FINDINGS: BODY WALL: No contributory findings.  LOWER CHEST: No contributory findings.  ABDOMEN/PELVIS:  Liver: No focal abnormality.  Hepatic steatosis.  Biliary: No evidence of biliary obstruction or stone.  Pancreas: Unremarkable.  Spleen: Unremarkable.  Adrenals: Unremarkable.  Kidneys and ureters: Numerous nonobstructing bilateral renal calculi, up to 5 mm in the bilateral interpolar region. No hydronephrosis or ureteral stone. Presumed sub cm cyst in the interpolar left kidney.  Bladder: Unremarkable.  Reproductive: No pathologic findings.  Bowel: Continued decrease in distal descending colon pericolonic fat inflammation, although circumferential wall thickening persists. It is also notable that there is no other diverticula. The tubular gas and fluid collection extending medially from the inflamed segment has partially decompressed. The unusual collection has unchanged morphology, extending across the midline deep to the abdominal wall and terminating in the right upper quadrant. The collection measures up to 8 mm in diameter, previously 15 mm. This collection is discrete from the appendix. No free perforation. No obstruction.  Retroperitoneum: No mass or adenopathy.  Peritoneum: No ascites or pneumoperitoneum.  Vascular: No acute abnormality.  OSSEOUS: No acute abnormalities.  IMPRESSION: 1. Partial decompression of the tubular abscess/fistula from the descending  colon, although unchanged in extent. Oral contrast has not yet reached the affected colon and is not useful in excluding ongoing communication with the colonic lumen. 2. Persistent focal wall thickening at the site of prior perforation. If surgical therapy is not planned, endoscopic follow-up is recommended. 3. Numerous bilateral renal calculi.   Electronically Signed   By: Monte Fantasia M.D.   On: 01/13/2015 21:23     Assessment/Plan: Con't IV Abx Low fiber diet Dietary consult for education IM consult for HTN, etc.

## 2015-01-14 NOTE — Consult Note (Signed)
[facility] [place] NAME: [name]    MR#:  [id number]  DATE OF BIRTH:  [birth year]  SUBJECTIVE:  CHIEF COMPLAINT:   Chief Complaint  Patient presents with  . Diverticulitis    54 y/o m with PMhx HTN who presents with abdominal pain found to have diverticulitis. Hospitalist consulted for medical management. Patient says he stopped taking his blood pressure medications 7 weeks ago when he fell ill.  No other issues. He is tolerating his diet well.  REVIEW OF SYSTEMS:  CONSTITUTIONAL: No fever, fatigue or weakness. +snoring EYES: No blurred or double vision.  EARS, NOSE, AND THROAT: No tinnitus or ear pain.  RESPIRATORY: No cough, shortness of breath, wheezing or hemoptysis.  CARDIOVASCULAR: No chest pain, orthopnea, edema.  GASTROINTESTINAL: No nausea, vomiting, diarrhea resolved abdominal pain.  GENITOURINARY: No dysuria, hematuria.  ENDOCRINE: No polyuria, nocturia,  HEMATOLOGY: No anemia, easy bruising or bleeding SKIN: No rash or lesion. MUSCULOSKELETAL: No joint pain or arthritis.   NEUROLOGIC: No tingling, numbness, weakness.  PSYCHIATRY: No anxiety or depression.   DRUG ALLERGIES:  No Known Allergies  VITALS:  Blood pressure 142/88, pulse 73, temperature 98.2 F (36.8 C), temperature source Oral, resp. rate 19, height 5\' 11"  (1.803 m), weight 145.151 kg (320 lb), SpO2 99 %.  PHYSICAL EXAMINATION:  GENERAL:  54 y.o.-year-old patient lying in the bed with no acute distress. Obese EYES: Pupils equal, round, reactive to light and accommodation. No scleral icterus. Extraocular muscles intact.  HEENT: Head atraumatic, normocephalic. Oropharynx and nasopharynx clear.  NECK:  Supple, no jugular venous distention. No thyroid enlargement, no tenderness.  LUNGS: Normal breath sounds bilaterally, no wheezing, rales,rhonchi or crepitation. No use of accessory muscles of respiration.  CARDIOVASCULAR: S1, S2 normal. No murmurs,  rubs, or gallops.  ABDOMEN: Soft, nontender, nondistended. Bowel sounds present. No organomegaly or mass. No rebound/guarding EXTREMITIES: No pedal edema, cyanosis, or clubbing.  NEUROLOGIC: Cranial nerves II through XII are intact. Muscle strength 5/5 in all extremities. Sensation intact. Gait not checked.  PSYCHIATRIC: The patient is alert and oriented x 3.  SKIN: No obvious rash, lesion, or ulcer.    LABORATORY PANEL:   CBC  Recent Labs Lab 01/14/15 0604  WBC 9.8  HGB 9.1*  HCT 28.2*  PLT 407   ------------------------------------------------------------------------------------------------------------------  Chemistries   Recent Labs Lab 01/14/15 0604  NA 139  K 3.7  CL 108  CO2 25  GLUCOSE 115*  BUN 6  CREATININE 0.80  CALCIUM 8.3*  AST 27  ALT 33  ALKPHOS 80  BILITOT 0.3   ------------------------------------------------------------------------------------------------------------------   ------------------------------------------------------------------------------------------------------------------  RADIOLOGY:  Ct Abdomen Pelvis W Contrast  01/13/2015   IMPRESSION: 1. Partial decompression of the tubular abscess/fistula from the descending colon, although unchanged in extent. Oral contrast has not yet reached the affected colon and is not useful in excluding ongoing communication with the colonic lumen. 2. Persistent focal wall thickening at the site of prior perforation. If surgical therapy is not planned, endoscopic follow-up is recommended. 3. Numerous bilateral renal calculi.   Electronically Signed   By: Monte Fantasia M.D.   On: 01/13/2015 21:23    EKG:     ASSESSMENT AND PLAN:  1. Hypertension: Will start Metoprolol and Hydralazine PRN. 2. Obesity with likely OSA: Patient needs outpatient sleep evaluation. 3. Diverticulits: Management per surgery 4 Anemia: Check iron studies 5. Hx Sarcoid stable 6. Neuropathy: Stable  Thank you will follow  CODE STATUS:FUll TOTAL TIME TAKING CARE OF THIS PATIENT: 45 minutes.     Hermilo Dutter M.D on 01/14/2015 at 12:38 PM  Between 7am to 6pm - Pager - [phone]  After 6pm go to [email or web address] - password [email or web address] [facility]  [facility]  Office  [phone]  CC: Primary care physician; [name], MD

## 2015-01-15 LAB — IRON AND TIBC
IRON: 19 ug/dL — AB (ref 45–182)
SATURATION RATIOS: 5 % — AB (ref 17.9–39.5)
TIBC: 362 ug/dL (ref 250–450)
UIBC: 344 ug/dL

## 2015-01-15 LAB — FERRITIN: Ferritin: 7 ng/mL — ABNORMAL LOW (ref 24–336)

## 2015-01-15 MED ORDER — LISINOPRIL 5 MG PO TABS
5.0000 mg | ORAL_TABLET | Freq: Every day | ORAL | Status: DC
  Administered 2015-01-15 – 2015-01-16 (×2): 5 mg via ORAL
  Filled 2015-01-15 (×2): qty 1

## 2015-01-15 NOTE — Plan of Care (Signed)
Problem: Phase II Progression Outcomes Goal: IV changed to normal saline lock Outcome: Not Applicable Date Met:  77/37/50 Continuous IVF at this time.

## 2015-01-15 NOTE — Plan of Care (Signed)
Problem: Discharge Progression Outcomes Goal: Other Discharge Outcomes/Goals Outcome: Not Applicable Date Met:  44/92/01 No additional Phase Outcome/Goals identified at this time.

## 2015-01-15 NOTE — Progress Notes (Signed)
[facility] [place] NAME: [name]    MR#:  [id number]  DATE OF BIRTH:  [birth year]  SUBJECTIVE:  Doing well this am BP improved Takes lisinopril 5 mg at home  REVIEW OF SYSTEMS:  CONSTITUTIONAL: No fever, fatigue or weakness.  RESPIRATORY: No cough, shortness of breath, wheezing or hemoptysis.  CARDIOVASCULAR: No chest pain, orthopnea, edema.  GASTROINTESTINAL: No nausea, vomiting, diarrhea or abdominal pain.  GENITOURINARY: No dysuria, hematuria.  MUSCULOSKELETAL: No joint pain or arthritis.   NEUROLOGIC: No tingling, numbness, weakness.    DRUG ALLERGIES:  No Known Allergies  VITALS:  Blood pressure 160/84, pulse 81, temperature 98.4 F (36.9 C), temperature source Oral, resp. rate 22, height 5\' 11"  (1.803 m), weight 145.151 kg (320 lb), SpO2 98 %.  PHYSICAL EXAMINATION:  GENERAL:  54 y.o.-year-old patient lying in the bed with no acute distress.  EYES: Pupils equal, round, reactive to light and accommodation. No scleral icterus. Extraocular muscles intact.  HEENT: Head atraumatic, normocephalic. Oropharynx and nasopharynx clear.  NECK:  Supple, no jugular venous distention. No thyroid enlargement, no tenderness.  LUNGS: Normal breath sounds bilaterally, no wheezing, rales,rhonchi or crepitation. No use of accessory muscles of respiration.  CARDIOVASCULAR: S1, S2 normal. No murmurs, rubs, or gallops.  ABDOMEN: Soft, nontender, nondistended. Bowel sounds present.obese EXTREMITIES: No pedal edema, cyanosis, or clubbing.  NEUROLOGIC: Cranial nerves II through XII are intact. Muscle strength 5/5 in all extremities. Sensation intact. Gait not checked.  PSYCHIATRIC: The patient is alert and oriented x 3.  SKIN: No obvious rash, lesion, or ulcer.    LABORATORY PANEL:   CBC  Recent Labs Lab 01/14/15 0604  WBC 9.8  HGB 9.1*  HCT 28.2*  PLT 407    ------------------------------------------------------------------------------------------------------------------  Chemistries   Recent Labs Lab 01/14/15 0604  NA 139  K 3.7  CL 108  CO2 25  GLUCOSE 115*  BUN 6  CREATININE 0.80  CALCIUM 8.3*  AST 27  ALT 33  ALKPHOS 80  BILITOT 0.3   ------------------------------------------------------------------------------------------------------------------  Cardiac Enzymes No results for input(s): TROPONINI in the last 168 hours. ------------------------------------------------------------------------------------------------------------------  RADIOLOGY:  No results found.    ASSESSMENT AND PLAN:   1. Hypertension: Patient takes Lisinopril at home 5 mg . Will restart this and d/c Metoprolol and continue Hydralazine PRN.  2. Obesity with likely OSA: Patient needs outpatient sleep evaluation. I discussed this with him. 3. Diverticulits: Management per surgery 4 Anemia: Iron studies are pending  5. Hx Sarcoid stable 6. Neuropathy: Stable     All the records are reviewed and case discussed with Care Management/Social Workerr. Management plans discussed with the patient and Dr. Leanora Cover and he is in agreement.  CODE STATUS: full  TOTAL TIME TAKING CARE OF THIS PATIENT: 25 minutes.   WILL SIGN OFF call if you have further questions   Alisea Matte M.D on 01/15/2015 at 12:35 PM  Between 7am to 6pm - Pager - [phone]  After 6pm go to [email or web address] - password [email or web address] [facility]  [facility]  Office  [phone]  CC: Primary care physician; [name], MD

## 2015-01-15 NOTE — Plan of Care (Signed)
Problem: Phase I Progression Outcomes Goal: Other Phase I Outcomes/Goals Outcome: Not Applicable Date Met:  01/15/15 No additional Phase Outcome/Goals identified at this time.     

## 2015-01-15 NOTE — Progress Notes (Signed)
Subjective: Pt feels much better on IV Abx and questions if he should have had a low fiber diet originally. No pain meds in 24 hours  Vital signs in last 24 hours: Temp:  [98.3 F (36.8 C)-98.4 F (36.9 C)] 98.4 F (36.9 C) (05/04 0819) Pulse Rate:  [72-81] 81 (05/04 0819) Resp:  [18-22] 22 (05/04 0819) BP: (135-160)/(52-84) 160/84 mmHg (05/04 0819) SpO2:  [98 %-99 %] 98 % (05/04 0819) Last BM Date: 01/14/15  Intake/Output from previous day: 05/03 0701 - 05/04 0700 In: 240 [P.O.:240] Out: 1425 [Urine:1425]  PERTINENT EXAM: Abdomen soft, NT  Lab Results:  CBC  Recent Labs  01/13/15 1801 01/14/15 0604  WBC 13.2* 9.8  HGB 9.7* 9.1*  HCT 31.3* 28.2*  PLT 452* 407   CMP     Component Value Date/Time   NA 139 01/14/2015 0604   NA 138 01/02/2015 1555   K 3.7 01/14/2015 0604   K 3.7 01/02/2015 1555   CL 108 01/14/2015 0604   CL 109 01/02/2015 1555   CO2 25 01/14/2015 0604   CO2 22 01/02/2015 1555   GLUCOSE 115* 01/14/2015 0604   GLUCOSE 110* 01/02/2015 1555   BUN 6 01/14/2015 0604   BUN 9 01/02/2015 1555   CREATININE 0.80 01/14/2015 0604   CREATININE 1.04 01/02/2015 1555   CALCIUM 8.3* 01/14/2015 0604   CALCIUM 8.8* 01/02/2015 1555   PROT 7.0 01/14/2015 0604   PROT 8.2* 01/02/2015 1555   ALBUMIN 3.3* 01/14/2015 0604   ALBUMIN 4.0 01/02/2015 1555   AST 27 01/14/2015 0604   AST 39 01/02/2015 1555   ALT 33 01/14/2015 0604   ALT 55 01/02/2015 1555   ALKPHOS 80 01/14/2015 0604   ALKPHOS 125 01/02/2015 1555   BILITOT 0.3 01/14/2015 0604   GFRNONAA >60 01/14/2015 0604   GFRNONAA >60 01/02/2015 1555   GFRAA >60 01/14/2015 0604   GFRAA >60 01/02/2015 1555   PT/INR  Recent Labs  01/14/15 0604  LABPROT 15.4*  INR 1.20    Studies/Results: Ct Abdomen Pelvis W Contrast  01/13/2015   CLINICAL DATA:  Abdominal pain, right upper quadrant. Elevated white count. Followup CT 01/03/2015  EXAM: CT ABDOMEN AND PELVIS WITH CONTRAST  TECHNIQUE: Multidetector CT imaging  of the abdomen and pelvis was performed using the standard protocol following bolus administration of intravenous contrast.  CONTRAST:  151mL OMNIPAQUE IOHEXOL 300 MG/ML  SOLN  COMPARISON:  01/03/2015  FINDINGS: BODY WALL: No contributory findings.  LOWER CHEST: No contributory findings.  ABDOMEN/PELVIS:  Liver: No focal abnormality.  Hepatic steatosis.  Biliary: No evidence of biliary obstruction or stone.  Pancreas: Unremarkable.  Spleen: Unremarkable.  Adrenals: Unremarkable.  Kidneys and ureters: Numerous nonobstructing bilateral renal calculi, up to 5 mm in the bilateral interpolar region. No hydronephrosis or ureteral stone. Presumed sub cm cyst in the interpolar left kidney.  Bladder: Unremarkable.  Reproductive: No pathologic findings.  Bowel: Continued decrease in distal descending colon pericolonic fat inflammation, although circumferential wall thickening persists. It is also notable that there is no other diverticula. The tubular gas and fluid collection extending medially from the inflamed segment has partially decompressed. The unusual collection has unchanged morphology, extending across the midline deep to the abdominal wall and terminating in the right upper quadrant. The collection measures up to 8 mm in diameter, previously 15 mm. This collection is discrete from the appendix. No free perforation. No obstruction.  Retroperitoneum: No mass or adenopathy.  Peritoneum: No ascites or pneumoperitoneum.  Vascular: No acute abnormality.  OSSEOUS: No acute abnormalities.  IMPRESSION: 1. Partial decompression of the tubular abscess/fistula from the descending colon, although unchanged in extent. Oral contrast has not yet reached the affected colon and is not useful in excluding ongoing communication with the colonic lumen. 2. Persistent focal wall thickening at the site of prior perforation. If surgical therapy is not planned, endoscopic follow-up is recommended. 3. Numerous bilateral renal calculi.    Electronically Signed   By: Monte Fantasia M.D.   On: 01/13/2015 21:23    Assessment/Plan: PICC D/C tomorrow on low fiber diet, IV Abx with HHN

## 2015-01-15 NOTE — Plan of Care (Signed)
Problem: Food- and Nutrition-Related Knowledge Deficit (NB-1.1) Goal: Nutrition education Formal process to instruct or train a patient/client in a skill or to impart knowledge to help patients/clients voluntarily manage or modify food choices and eating behavior to maintain or improve health. Outcome: Completed/Met Date Met:  01/15/15 Nutrition Education Note  RD consulted for nutrition education regarding a Low Fiber Nutrition therapy  RD provided "Fiber Restricted Nutrition Therapy" handout from the Academy of [employer]. Reviewed patient's dietary recall. Provided examples on ways to decrease fiber intake in diet. Examples given with recommended list of foods and not recommended list. Teach back method used.  Expect good compliance.  Current diet order is Soft, patient is consuming approximately >50% of meals at this time. Labs and medications reviewed. No further nutrition interventions warranted at this time. RD contact information provided. If additional nutrition issues arise, please re-consult RD.  [name], [place], LDN Pager [phone]

## 2015-01-15 NOTE — Progress Notes (Addendum)
[facility]  Patient Status: Anticipated discharge tomorrow  [facility] is providing the following services: IV antibiotics and nursing arranged with Edward Qualia for home administration. Pt agreeable to POC.   If patient discharges after hours, please call [phone].   Jolly Mango 01/15/2015, 1:19 PM  Pt lives at home with his wife, uses a cane. He is on disability with a hx of sarcoidosis, diverticulitis, HTN and obesity. Denies issues obtaining meds, copays or transportation.

## 2015-01-15 NOTE — Plan of Care (Signed)
Problem: Phase II Progression Outcomes Goal: Other Phase II Outcomes/Goals Outcome: Not Applicable Date Met:  51/61/44 No additional Phase Outcome/Goals identified at this time.

## 2015-01-15 NOTE — Plan of Care (Signed)
Problem: Phase I Progression Outcomes Goal: OOB as tolerated unless otherwise ordered Outcome: Completed/Met Date Met:  01/15/15 Pt ambulatory and independent.

## 2015-01-15 NOTE — Plan of Care (Signed)
Problem: Phase I Progression Outcomes Goal: Initial discharge plan identified Outcome: Not Applicable Date Met:  91/06/81 No discharge note/encounter noted in documents at this time.

## 2015-01-15 NOTE — Plan of Care (Signed)
Problem: Phase III Progression Outcomes Goal: Other Phase III Outcomes/Goals Outcome: Not Applicable Date Met:  01/15/15 No additional Phase Outcome/Goals identified at this time.        

## 2015-01-15 NOTE — Plan of Care (Signed)
Problem: Phase III Progression Outcomes Goal: IV/normal saline lock discontinued Outcome: Not Applicable Date Met:  97/67/34 Continuous IVF at this time.

## 2015-01-16 ENCOUNTER — Inpatient Hospital Stay: Payer: BLUE CROSS/BLUE SHIELD

## 2015-01-16 MED ORDER — CEFTRIAXONE SODIUM IN DEXTROSE 20 MG/ML IV SOLN: 1.0000 g | INTRAVENOUS | Status: AC

## 2015-01-16 MED ORDER — CLINDAMYCIN PHOSPHATE 600 MG/50ML IV SOLN: 600.0000 mg | Freq: Three times a day (TID) | INTRAVENOUS | Status: AC

## 2015-01-16 NOTE — Progress Notes (Signed)
Pt d/c to home today w/PICC line placed by [facility].  IV removed intact.  Pt d/c instructions reviewed and all questions and concerns addressed.  Rx printed and given to case management for [facility] administration of antibiotics.  Medication Education printed on D/C paperwork.

## 2015-01-16 NOTE — Discharge Summary (Signed)
[name] MRN: [id number] DOB/AGE: 1960-10-13 53 y.o.  Admit date: 01/13/2015 Discharge date: 01/16/2015  Discharge Diagnoses:  Persistent acute diverticulitis of large intestine with perforation and abscess  Procedures Performed: None  Discharged Condition: stable  Hospital Course: The patient was admitted to the hospital on a limited clear liquid diet and was started on IV antibiotics. His diet was advanced to low fiber and he had a dietary consult for educational purposes and they also described a low fiber diet to him. He had a PICC line placed and was discharged home with home health nursing therapy for IV antibiotics to continue for an additional 8 days (until May 13).  Discharge Orders: Discharge Instructions    Continue PICC at discharge    Complete by:  As directed   Flush PICC line per home health policy:  Yes           Disposition:   Discharge Medications:  Current facility-administered medications:  .  acetaminophen (TYLENOL) tablet 650 mg, 650 mg, Oral, Q6H PRN, Sherri Rad, MD, 650 mg at 01/16/15 0748 .  cefTRIAXone (ROCEPHIN) 1 g in dextrose 5 % 50 mL IVPB - Premix, 1 g, Intravenous, Q24H, Molly Maduro, MD, 1 g at 01/15/15 1719 .  clindamycin (CLEOCIN) IVPB 600 mg, 600 mg, Intravenous, 3 times per day, Molly Maduro, MD, 600 mg at 01/16/15 0850 .  dextrose 5 % and 0.45 % NaCl with KCl 20 mEq/L infusion, , Intravenous, Continuous, Molly Maduro, MD, Last Rate: 125 mL/hr at 01/16/15 0636 .  enoxaparin (LOVENOX) injection 40 mg, 40 mg, Subcutaneous, Q24H, Molly Maduro, MD, 40 mg at 01/15/15 1719 .  hydrALAZINE (APRESOLINE) injection 10 mg, 10 mg, Intravenous, Q6H PRN, Bettey Costa, MD .  lisinopril (PRINIVIL,ZESTRIL) tablet 5 mg, 5 mg, Oral, Daily, Bettey Costa, MD, 5 mg at 01/16/15 0958 .  morphine 2 MG/ML injection 2-6 mg, 2-6 mg, Intravenous, Q2H PRN, Molly Maduro, MD, 4 mg at 01/13/15 1851 .  ondansetron (ZOFRAN) tablet 4 mg, 4 mg, Oral,  Q6H PRN **OR** ondansetron (ZOFRAN) injection 4 mg, 4 mg, Intravenous, Q6H PRN, Molly Maduro, MD .  zolpidem Lorrin Mais) tablet 10 mg, 10 mg, Oral, QHS PRN, Molly Maduro, MD, 10 mg at 01/15/15 2225  Follwup:  with Dr. Sherri Rad.  Signed: Consuela Mimes 01/16/2015, 12:27 PM

## 2015-01-16 NOTE — Discharge Instructions (Signed)
PICC Home Guide A peripherally inserted central catheter (PICC) is a long, thin, flexible tube that is inserted into a vein in the upper arm. It is a form of intravenous (IV) access. It is considered to be a "central" line because the tip of the PICC ends in a large vein in your chest. This large vein is called the superior vena cava (SVC). The PICC tip ends in the SVC because there is a lot of blood flow in the SVC. This allows medicines and IV fluids to be quickly distributed throughout the body. The PICC is inserted using a sterile technique by a specially trained nurse or physician. After the PICC is inserted, a chest X-ray exam is done to be sure it is in the correct place.  A PICC may be placed for different reasons, such as:  To give medicines and liquid nutrition that can only be given through a central line. Examples are:  Certain antibiotic treatments.  Chemotherapy.  Total parenteral nutrition (TPN).  To take frequent blood samples.  To give IV fluids and blood products.  If there is difficulty placing a peripheral intravenous (PIV) catheter. If taken care of properly, a PICC can remain in place for several months. A PICC can also allow a person to go home from the hospital early. Medicine and PICC care can be managed at home by a family member or home health care team. WHAT PROBLEMS CAN HAPPEN WHEN I HAVE A PICC? Problems with a PICC can occasionally occur. These may include the following:  A blood clot (thrombus) forming in or at the tip of the PICC. This can cause the PICC to become clogged. A clot-dissolving medicine called tissue plasminogen activator (tPA) can be given through the PICC to help break up the clot.  Inflammation of the vein (phlebitis) in which the PICC is placed. Signs of inflammation may include redness, pain at the insertion site, red streaks, or being able to feel a "cord" in the vein where the PICC is located.  Infection in the PICC or at the insertion  site. Signs of infection may include fever, chills, redness, swelling, or pus drainage from the PICC insertion site.  PICC movement (malposition). The PICC tip may move from its original position due to excessive physical activity, forceful coughing, sneezing, or vomiting.  A break or cut in the PICC. It is important to not use scissors near the PICC.  Nerve or tendon irritation or injury during PICC insertion. WHAT SHOULD I KEEP IN MIND ABOUT ACTIVITIES WHEN I HAVE A PICC?  You may bend your arm and move it freely. If your PICC is near or at the bend of your elbow, avoid activity with repeated motion at the elbow.  Rest at home for the remainder of the day following PICC line insertion.  Avoid lifting heavy objects as instructed by your health care provider.  Avoid using a crutch with the arm on the same side as your PICC. You may need to use a walker. WHAT SHOULD I KNOW ABOUT MY PICC DRESSING?  Keep your PICC bandage (dressing) clean and dry to prevent infection.  Ask your health care provider when you may shower. Ask your health care provider to teach you how to wrap the PICC when you do take a shower.  Change the PICC dressing as instructed by your health care provider.  Change your PICC dressing if it becomes loose or wet. WHAT SHOULD I KNOW ABOUT PICC CARE?  Check the PICC insertion site   daily for leakage, redness, swelling, or pain.  Do not take a bath, swim, or use hot tubs when you have a PICC. Cover PICC line with clear plastic wrap and tape to keep it dry while showering.  Flush the PICC as directed by your health care provider. Let your health care provider know right away if the PICC is difficult to flush or does not flush. Do not use force to flush the PICC.  Do not use a syringe that is less than 10 mL to flush the PICC.  Never pull or tug on the PICC.  Avoid blood pressure checks on the arm with the PICC.  Keep your PICC identification card with you at all  times.  Do not take the PICC out yourself. Only a trained clinical professional should remove the PICC. SEEK IMMEDIATE MEDICAL CARE IF:  Your PICC is accidentally pulled all the way out. If this happens, cover the insertion site with a bandage or gauze dressing. Do not throw the PICC away. Your health care provider will need to inspect it.  Your PICC was tugged or pulled and has partially come out. Do not  push the PICC back in.  There is any type of drainage, redness, or swelling where the PICC enters the skin.  You cannot flush the PICC, it is difficult to flush, or the PICC leaks around the insertion site when it is flushed.  You hear a "flushing" sound when the PICC is flushed.  You have pain, discomfort, or numbness in your arm, shoulder, or jaw on the same side as the PICC.  You feel your heart "racing" or skipping beats.  You notice a hole or tear in the PICC.  You develop chills or a fever. MAKE SURE YOU:   Understand these instructions.  Will watch your condition.  Will get help right away if you are not doing well or get worse. Document Released: 03/06/2003 Document Revised: 01/14/2014 Document Reviewed: 05/07/2013 [facility] Patient Information 2015 [place], [place]. This information is not intended to replace advice given to you by your health care provider. Make sure you discuss any questions you have with your health care provider. Ceftriaxone injection Clindamycin injection What is this medicine? CLINDAMYCIN ([place] sin) is a lincosamide antibiotic. It is used to treat certain kinds of bacterial infections. It will not work for colds, flu, or other viral infections. This medicine may be used for other purposes; ask your health care provider or pharmacist if you have questions. COMMON BRAND NAME(S): Cleocin What should I tell my health care provider before I take this medicine? They need to know if you have any of these conditions: -asthma, eczema, hayfever, or  sinusitis -liver disease -stomach problems like colitis -an unusual or allergic reaction to clindamycin, lincomycin, benzyl alcohol, other medicines, foods, dyes or preservatives -pregnant or trying to get pregnant -breast-feeding How should I use this medicine? This medicine is for injection into a muscle or into a vein. It is usually given by a health care professional in a hospital or clinic setting. If you get this medicine at home, you will be taught how to prepare and give this medicine. Use exactly as directed. Take your medicine at regular intervals. Do not take your medicine more often than directed. It is important that you put your used needles and syringes in a special sharps container. Do not put them in a trash can. If you do not have a sharps container, call your pharmacist or healthcare provider to  get one. Talk to your pediatrician regarding the use of this medicine in children. Special care may be needed. This medicine is not for use in premature infants. Overdosage: If you think you have taken too much of this medicine contact a poison control center or emergency room at once. NOTE: This medicine is only for you. Do not share this medicine with others. What if I miss a dose? If you miss a dose, use it as soon as you can. If it is almost time for your next dose, use only that dose. Do not use double or extra doses. What may interact with this medicine? -birth control pills -erythromycin -some medicines used during surgery This list may not describe all possible interactions. Give your health care provider a list of all the medicines, herbs, non-prescription drugs, or dietary supplements you use. Also tell them if you smoke, drink alcohol, or use illegal drugs. Some items may interact with your medicine. What should I watch for while using this medicine? Tell your doctor or health care professional if your symptoms do not improve or if you get new symptoms. Do not treat  diarrhea with over-the-counter medicines. Contact your doctor if you have diarrhea that lasts more than 2 days, or is severe and watery. What side effects may I notice from receiving this medicine? Side effects that you should report to your doctor or health care professional as soon as possible: -allergic reactions like skin rash, itching or hives, swelling of the face, lips, or tongue -change in amount or color of urine -chest pain -low blood pressure -redness, blistering, peeling or loosening of the skin, including inside the mouth -unusual bleeding, bruising -unusually weak or tired -yellowing of eyes, skin Side effects that usually do not require medical attention (report to your doctor or health care professional if they continue or are bothersome): -body aches, pain -metallic, bad taste -nausea, vomiting -pain where injected -stomach pain -vaginal irritation, itching This list may not describe all possible side effects. Call your doctor for medical advice about side effects. You may report side effects to FDA at 1-800-FDA-1088. Where should I keep my medicine? Keep out of the reach of children. If you are using this medicine at home, you will be instructed on how to store this medicine. Throw away any unused medicine after the expiration date on the label. NOTE: This sheet is a summary. It may not cover all possible information. If you have questions about this medicine, talk to your doctor, pharmacist, or health care provider.  2015, [employer] Standard. (2013-04-05 16:13:43)  What is this medicine? CEFTRIAXONE (sef try AX one) is a cephalosporin antibiotic. It is used to treat certain kinds of bacterial infections. It will not work for colds, flu, or other viral infections. This medicine may be used for other purposes; ask your health care provider or pharmacist if you have questions. COMMON BRAND NAME(S): Rocephin What should I tell my health care provider before I take  this medicine? They need to know if you have any of these conditions: -any chronic illness -bowel disease, like colitis -both kidney and liver disease -high bilirubin level in newborn patients -an unusual or allergic reaction to ceftriaxone, other cephalosporin or penicillin antibiotics, foods, dyes or preservatives -pregnant or trying to get pregnant -breast-feeding How should I use this medicine? This medicine is injected into a muscle or infused it into a vein. It is usually given in a medical office or clinic. If you are to give this medicine you will  be taught how to inject it. Follow instructions carefully. Use your doses at regular intervals. Do not take your medicine more often than directed. Do not skip doses or stop your medicine early even if you feel better. Do not stop taking except on your doctor's advice. Talk to your pediatrician regarding the use of this medicine in children. Special care may be needed. Overdosage: If you think you have taken too much of this medicine contact a poison control center or emergency room at once. NOTE: This medicine is only for you. Do not share this medicine with others. What if I miss a dose? If you miss a dose, take it as soon as you can. If it is almost time for your next dose, take only that dose. Do not take double or extra doses. What may interact with this medicine? Do not take this medicine with any of the following medications: -intravenous calcium This medicine may also interact with the following medications: -birth control pills This list may not describe all possible interactions. Give your health care provider a list of all the medicines, herbs, non-prescription drugs, or dietary supplements you use. Also tell them if you smoke, drink alcohol, or use illegal drugs. Some items may interact with your medicine. What should I watch for while using this medicine? Tell your doctor or health care professional if your symptoms do not  improve or if they get worse. Do not treat diarrhea with over the counter products. Contact your doctor if you have diarrhea that lasts more than 2 days or if it is severe and watery. If you are being treated for a sexually transmitted disease, avoid sexual contact until you have finished your treatment. Having sex can infect your sexual partner. Calcium may bind to this medicine and cause lung or kidney problems. Avoid calcium products while taking this medicine and for 48 hours after taking the last dose of this medicine. What side effects may I notice from receiving this medicine? Side effects that you should report to your doctor or health care professional as soon as possible: -allergic reactions like skin rash, itching or hives, swelling of the face, lips, or tongue -breathing problems -fever, chills -irregular heartbeat -pain when passing urine -seizures -stomach pain, cramps -unusual bleeding, bruising -unusually weak or tired Side effects that usually do not require medical attention (report to your doctor or health care professional if they continue or are bothersome): -diarrhea -dizzy, drowsy -headache -nausea, vomiting -pain, swelling, irritation where injected -stomach upset -sweating This list may not describe all possible side effects. Call your doctor for medical advice about side effects. You may report side effects to FDA at 1-800-FDA-1088. Where should I keep my medicine? Keep out of the reach of children. Store at room temperature below 25 degrees C (77 degrees F). Protect from light. Throw away any unused vials after the expiration date. NOTE: This sheet is a summary. It may not cover all possible information. If you have questions about this medicine, talk to your doctor, pharmacist, or health care provider.  2015, [employer]/Gold Standard. (2013-04-05 15:59:53)  Ceftriaxone injection Clindamycin capsules What is this medicine? CLINDAMYCIN ([place] sin) is a  lincosamide antibiotic. It is used to treat certain kinds of bacterial infections. It will not work for colds, flu, or other viral infections. This medicine may be used for other purposes; ask your health care provider or pharmacist if you have questions. COMMON BRAND NAME(S): Cleocin What should I tell my health care provider before I  take this medicine? They need to know if you have any of these conditions: -kidney disease -liver disease -stomach problems like colitis -an unusual or allergic reaction to clindamycin, lincomycin, or other medicines, foods, dyes like tartrazine or preservatives -pregnant or trying to get pregnant -breast-feeding How should I use this medicine? Take this medicine by mouth with a full glass of water. Follow the directions on the prescription label. You can take this medicine with food or on an empty stomach. If the medicine upsets your stomach, take it with food. Take your medicine at regular intervals. Do not take your medicine more often than directed. Take all of your medicine as directed even if you think your are better. Do not skip doses or stop your medicine early. Talk to your pediatrician regarding the use of this medicine in children. Special care may be needed. Overdosage: If you think you have taken too much of this medicine contact a poison control center or emergency room at once. NOTE: This medicine is only for you. Do not share this medicine with others. What if I miss a dose? If you miss a dose, take it as soon as you can. If it is almost time for your next dose, take only that dose. Do not take double or extra doses. What may interact with this medicine? -birth control pills -chloramphenicol -erythromycin -kaolin products This list may not describe all possible interactions. Give your health care provider a list of all the medicines, herbs, non-prescription drugs, or dietary supplements you use. Also tell them if you smoke, drink alcohol, or use  illegal drugs. Some items may interact with your medicine. What should I watch for while using this medicine? Tell your doctor or healthcare professional if your symptoms do not start to get better or if they get worse. Do not treat diarrhea with over the counter products. Contact your doctor if you have diarrhea that lasts more than 2 days or if it is severe and watery. What side effects may I notice from receiving this medicine? Side effects that you should report to your doctor or health care professional as soon as possible: -allergic reactions like skin rash, itching or hives, swelling of the face, lips, or tongue -dark urine -pain on swallowing -redness, blistering, peeling or loosening of the skin, including inside the mouth -unusual bleeding or bruising -unusually weak or tired -yellowing of eyes or skin Side effects that usually do not require medical attention (report to your doctor or health care professional if they continue or are bothersome): -diarrhea -itching in the rectal or genital area -joint pain -nausea, vomiting -stomach pain This list may not describe all possible side effects. Call your doctor for medical advice about side effects. You may report side effects to FDA at 1-800-FDA-1088. Where should I keep my medicine? Keep out of the reach of children. Store at room temperature between 20 and 25 degrees C (68 and 77 degrees F). Throw away any unused medicine after the expiration date. NOTE: This sheet is a summary. It may not cover all possible information. If you have questions about this medicine, talk to your doctor, pharmacist, or health care provider.  2015, [employer] Standard. (2013-04-05 16:12:32)  What is this medicine? CEFTRIAXONE (sef try AX one) is a cephalosporin antibiotic. It is used to treat certain kinds of bacterial infections. It will not work for colds, flu, or other viral infections. This medicine may be used for other purposes; ask your  health care provider or pharmacist if you  have questions. COMMON BRAND NAME(S): Rocephin What should I tell my health care provider before I take this medicine? They need to know if you have any of these conditions: -any chronic illness -bowel disease, like colitis -both kidney and liver disease -high bilirubin level in newborn patients -an unusual or allergic reaction to ceftriaxone, other cephalosporin or penicillin antibiotics, foods, dyes or preservatives -pregnant or trying to get pregnant -breast-feeding How should I use this medicine? This medicine is injected into a muscle or infused it into a vein. It is usually given in a medical office or clinic. If you are to give this medicine you will be taught how to inject it. Follow instructions carefully. Use your doses at regular intervals. Do not take your medicine more often than directed. Do not skip doses or stop your medicine early even if you feel better. Do not stop taking except on your doctor's advice. Talk to your pediatrician regarding the use of this medicine in children. Special care may be needed. Overdosage: If you think you have taken too much of this medicine contact a poison control center or emergency room at once. NOTE: This medicine is only for you. Do not share this medicine with others. What if I miss a dose? If you miss a dose, take it as soon as you can. If it is almost time for your next dose, take only that dose. Do not take double or extra doses. What may interact with this medicine? Do not take this medicine with any of the following medications: -intravenous calcium This medicine may also interact with the following medications: -birth control pills This list may not describe all possible interactions. Give your health care provider a list of all the medicines, herbs, non-prescription drugs, or dietary supplements you use. Also tell them if you smoke, drink alcohol, or use illegal drugs. Some items may interact  with your medicine. What should I watch for while using this medicine? Tell your doctor or health care professional if your symptoms do not improve or if they get worse. Do not treat diarrhea with over the counter products. Contact your doctor if you have diarrhea that lasts more than 2 days or if it is severe and watery. If you are being treated for a sexually transmitted disease, avoid sexual contact until you have finished your treatment. Having sex can infect your sexual partner. Calcium may bind to this medicine and cause lung or kidney problems. Avoid calcium products while taking this medicine and for 48 hours after taking the last dose of this medicine. What side effects may I notice from receiving this medicine? Side effects that you should report to your doctor or health care professional as soon as possible: -allergic reactions like skin rash, itching or hives, swelling of the face, lips, or tongue -breathing problems -fever, chills -irregular heartbeat -pain when passing urine -seizures -stomach pain, cramps -unusual bleeding, bruising -unusually weak or tired Side effects that usually do not require medical attention (report to your doctor or health care professional if they continue or are bothersome): -diarrhea -dizzy, drowsy -headache -nausea, vomiting -pain, swelling, irritation where injected -stomach upset -sweating This list may not describe all possible side effects. Call your doctor for medical advice about side effects. You may report side effects to FDA at 1-800-FDA-1088. Where should I keep my medicine? Keep out of the reach of children. Store at room temperature below 25 degrees C (77 degrees F). Protect from light. Throw away any unused vials after the expiration  date. NOTE: This sheet is a summary. It may not cover all possible information. If you have questions about this medicine, talk to your doctor, pharmacist, or health care provider.  2015,  [employer] Standard. (2013-04-05 15:59:53)

## 2015-01-23 ENCOUNTER — Other Ambulatory Visit: Payer: Self-pay | Admitting: Surgery

## 2015-01-23 DIAGNOSIS — K5732 Diverticulitis of large intestine without perforation or abscess without bleeding: Secondary | ICD-10-CM

## 2015-01-23 DIAGNOSIS — R1084 Generalized abdominal pain: Secondary | ICD-10-CM

## 2015-02-07 ENCOUNTER — Ambulatory Visit
Admission: RE | Admit: 2015-02-07 | Discharge: 2015-02-07 | Disposition: A | Discharge status: Home / Self Care | Payer: BLUE CROSS/BLUE SHIELD | Source: Ambulatory Visit | Attending: Surgery | Admitting: Surgery

## 2015-02-07 DIAGNOSIS — K760 Fatty (change of) liver, not elsewhere classified: Secondary | ICD-10-CM | POA: Insufficient documentation

## 2015-02-07 DIAGNOSIS — K5732 Diverticulitis of large intestine without perforation or abscess without bleeding: Secondary | ICD-10-CM

## 2015-02-07 DIAGNOSIS — K449 Diaphragmatic hernia without obstruction or gangrene: Secondary | ICD-10-CM | POA: Diagnosis not present

## 2015-02-07 DIAGNOSIS — K5730 Diverticulosis of large intestine without perforation or abscess without bleeding: Secondary | ICD-10-CM | POA: Insufficient documentation

## 2015-02-07 DIAGNOSIS — R933 Abnormal findings on diagnostic imaging of other parts of digestive tract: Secondary | ICD-10-CM | POA: Insufficient documentation

## 2015-02-07 DIAGNOSIS — K5792 Diverticulitis of intestine, part unspecified, without perforation or abscess without bleeding: Secondary | ICD-10-CM | POA: Diagnosis present

## 2015-02-07 DIAGNOSIS — R1084 Generalized abdominal pain: Secondary | ICD-10-CM

## 2015-02-07 DIAGNOSIS — N200 Calculus of kidney: Secondary | ICD-10-CM | POA: Diagnosis not present

## 2015-02-07 MED ORDER — IOHEXOL 350 MG/ML SOLN
100.0000 mL | Freq: Once | INTRAVENOUS | Status: AC | PRN
  Administered 2015-02-07: 100 mL via INTRAVENOUS

## 2015-02-12 ENCOUNTER — Telehealth: Payer: Self-pay | Admitting: Surgery

## 2015-02-12 NOTE — Telephone Encounter (Signed)
na

## 2015-02-12 NOTE — Telephone Encounter (Signed)
Emailed Dr.bird waiting for response on how to proceed

## 2015-02-13 ENCOUNTER — Ambulatory Visit: Payer: Self-pay | Admitting: Surgery

## 2015-02-14 ENCOUNTER — Ambulatory Visit: Payer: Self-pay | Admitting: Surgery

## 2015-02-19 ENCOUNTER — Encounter: Payer: Self-pay | Admitting: Surgery

## 2015-02-19 ENCOUNTER — Ambulatory Visit (INDEPENDENT_AMBULATORY_CARE_PROVIDER_SITE_OTHER): Payer: BLUE CROSS/BLUE SHIELD | Admitting: Surgery

## 2015-02-19 ENCOUNTER — Ambulatory Visit: Payer: Self-pay | Admitting: Surgery

## 2015-02-19 VITALS — BP 146/77 | HR 96 | Temp 98.4°F | Resp 22 | Ht 71.0 in | Wt 320.0 lb

## 2015-02-19 DIAGNOSIS — K5732 Diverticulitis of large intestine without perforation or abscess without bleeding: Secondary | ICD-10-CM

## 2015-02-19 MED ORDER — PANTOPRAZOLE SODIUM 40 MG PO TBEC: 40.0000 mg | DELAYED_RELEASE_TABLET | Freq: Every day | ORAL | Status: DC

## 2015-02-19 NOTE — Progress Notes (Signed)
Doing well.  Min pain.  Still with loose stools.  Still with reflux symptoms.  Still with picc line.  CT shows persistent thickening of sigmoid colon.  Tolerating diet.  No fevers/chills, night sweats, shortness of breath, cough, chest pain, nausea/vomiting, dysuria/hematuria.  Blood pressure 146/77, pulse 96, temperature 98.4 F (36.9 C), temperature source Oral, resp. rate 22, height 5\' 11"  (1.803 m), weight 320 lb (145.151 kg). GEN: NAD/A&Ox3 FACE: no obvious facial trauma, normal external nose, normal external ears EYES: no scleral icterus, no conjunctivitis HEAD: normocephalic atraumatic CV: RRR, no MRG RESP: moving air well, lungs clear ABD: soft, nontender, nondistended EXT: moving all ext well, strength 5/5 NEURO: cnII-XII grossly intact, sensation intact all 4 ext  Labs: no new labs CT ABD: persistent colonic thickening and mild inflammation  A/P 54 yo with persistent diverticulitis vs neoplasm.  Much improved. Given Rx for protonix for reflux sx and referral to GI to discuss colonoscopy.

## 2015-02-24 ENCOUNTER — Ambulatory Visit: Payer: BLUE CROSS/BLUE SHIELD | Admitting: Gastroenterology

## 2015-03-21 DIAGNOSIS — I10 Essential (primary) hypertension: Secondary | ICD-10-CM | POA: Insufficient documentation

## 2015-03-21 DIAGNOSIS — Z87898 Personal history of other specified conditions: Secondary | ICD-10-CM | POA: Insufficient documentation

## 2015-03-21 DIAGNOSIS — E669 Obesity, unspecified: Secondary | ICD-10-CM | POA: Insufficient documentation

## 2015-03-21 HISTORY — DX: Obesity, unspecified: E66.9

## 2015-03-24 ENCOUNTER — Encounter: Payer: Self-pay | Admitting: Gastroenterology

## 2015-03-24 ENCOUNTER — Ambulatory Visit (INDEPENDENT_AMBULATORY_CARE_PROVIDER_SITE_OTHER): Payer: BLUE CROSS/BLUE SHIELD | Admitting: Gastroenterology

## 2015-03-24 VITALS — BP 152/84 | HR 117 | Temp 98.4°F | Ht 71.0 in | Wt 319.0 lb

## 2015-03-24 DIAGNOSIS — K625 Hemorrhage of anus and rectum: Secondary | ICD-10-CM

## 2015-03-24 DIAGNOSIS — K5721 Diverticulitis of large intestine with perforation and abscess with bleeding: Secondary | ICD-10-CM | POA: Diagnosis not present

## 2015-03-24 NOTE — Progress Notes (Signed)
Gastroenterology Consultation  Referring Provider:     Marlyce Huge,* Primary Care Physician:  [name], MD Primary Gastroenterologist:  Dr. Allen Norris     Reason for Consultation:     Diverticulitis        HPI:   [name] is a 54 y.o. y/o male referred for consultation & management of diverticulitis by Dr. [name], MD.  This patient comes today with a history of diverticulitis. The patient states that he was in the hospital for diverticulitis and was sent home. He shortly came back after being discharged and had abdominal pain and a repeat CT scan. The patient had a CT scan that showed him to have a small abscess and thickening of the colon wall suggestive of possible diverticulitis versus colon cancer. The patient's interval between the 2 colonoscopies was 2 months and showed the same thickening suspicious for possible colon adenocarcinoma. The patient states he has been feeling bloated and having abdominal discomfort with belching ever since his attack of diverticulitis. He now reports that he has some night sweats but is not having fevers and chills like he did when he had the diverticulitis. There is a report of weight loss of 50 pounds since this has all started. He does report that he has rectal bleeding and his most recent hemoglobin was around 9.  Past Medical History  Diagnosis Date  . Hypertension   . Sarcoid   . Neuropathy   . Obstructive apnea 04/26/2013  . Diverticulitis large intestine 01/13/2015  . Diverticulitis of colon 02/19/2015  . 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 Extreme obesity 12/21/2012  . LBP (low back pain) 05/28/2014  . H/O disease 03/21/2015  . Adiposity 03/21/2015    History reviewed. No pertinent past surgical history.  Prior to Admission medications   Medication Sig Start Date End Date Taking? Authorizing Provider  acetaminophen (TYLENOL) 325 MG tablet Take by mouth.    Historical  Provider, MD  AMBIEN 10 MG tablet  02/16/15   Historical Provider, MD  pantoprazole (PROTONIX) 40 MG tablet Take 1 tablet (40 mg total) by mouth daily. 02/19/15   Marlyce Huge, MD    Family History  Problem Relation Age of Onset  . Arthritis Mother   . Heart disease Mother   . Stroke Father      History  Substance Use Topics  . Smoking status: Never Smoker   . Smokeless tobacco: Never Used  . Alcohol Use: No    Allergies as of 03/24/2015 - Review Complete 03/24/2015  Allergen Reaction Noted  . Fentanyl Nausea And Vomiting 02/19/2015  . Versed [midazolam] Nausea And Vomiting 02/19/2015  . Gabapentin  01/16/2015  . Paba derivatives Nausea And Vomiting 01/16/2015    Review of Systems:    All systems reviewed and negative except where noted in HPI.   Physical Exam:  BP 152/84 mmHg  Pulse 117  Temp(Src) 98.4 F (36.9 C) (Oral)  Ht 5\' 11"  (1.803 m)  Wt 319 lb (144.697 kg)  BMI 44.51 kg/m2 No LMP for male patient. Psych:  Alert and cooperative. Normal mood and affect. General:   Alert,  Well-developed, obese, well-nourished, pleasant and cooperative in NAD Head:  Normocephalic and atraumatic. Eyes:  Sclera clear, no icterus.   Conjunctiva pink. Ears:  Normal auditory acuity. Nose:  No deformity, discharge, or lesions. Mouth:  No deformity or lesions,oropharynx pink & moist. Neck:  Supple; no masses or thyromegaly. Lungs:  Respirations even  and unlabored.  Clear throughout to auscultation.   No wheezes, crackles, or rhonchi. No acute distress. Heart:  Regular rate and rhythm; no murmurs, clicks, rubs, or gallops. Abdomen:  Normal bowel sounds.  No bruits.  Soft, non-tender and non-distended without masses, hepatosplenomegaly or hernias noted.  No guarding or rebound tenderness.  Negative Carnett sign.   Rectal:  Deferred.  Msk:  Symmetrical without gross deformities.  Good, equal movement & strength bilaterally. Pulses:  Normal pulses noted. Extremities:  No clubbing  or edema.  No cyanosis. Neurologic:  Alert and oriented x3;  grossly normal neurologically. Skin:  Intact without significant lesions or rashes.  No jaundice. Lymph Nodes:  No significant cervical adenopathy. Psych:  Alert and cooperative. Normal mood and affect.  Imaging Studies: No results found.  Assessment and Plan:   [name] is a 54 y.o. y/o male comes in with diverticulitis after being referred to me from surgery. The patient was found to have what was suggestive of a small abscess. The patient has not had a colonoscopy in the past. The patient continues to have symptoms of possible obstruction with bloating and burping. He also has abdominal distention and rectal bleeding. The patient denies ever having colonoscopy in the past and will be set up for a colonoscopy. We'll wait until the diverticulitis has resolved prior to doing the colonoscopy which should be about 6 weeks. The patient has been told that if his symptoms do not improve or he starts having worsening of his abdominal pain or bleeding he should go to the emergency room. The patient will also have his blood checked today for a CBC to see if his anemia has worsened. The patient and his wife have been explained the plan and agree with it.

## 2015-03-25 LAB — CBC WITH DIFFERENTIAL/PLATELET
BASOS ABS: 0.1 10*3/uL (ref 0.0–0.2)
Basos: 1 %
EOS (ABSOLUTE): 0.1 10*3/uL (ref 0.0–0.4)
Eos: 1 %
HEMOGLOBIN: 8.3 g/dL — AB (ref 12.6–17.7)
Hematocrit: 27.3 % — ABNORMAL LOW (ref 37.5–51.0)
IMMATURE GRANS (ABS): 0 10*3/uL (ref 0.0–0.1)
Immature Granulocytes: 0 %
Lymphocytes Absolute: 2.1 10*3/uL (ref 0.7–3.1)
Lymphs: 19 %
MCH: 21.1 pg — AB (ref 26.6–33.0)
MCHC: 30.4 g/dL — AB (ref 31.5–35.7)
MCV: 70 fL — AB (ref 79–97)
Monocytes Absolute: 1 10*3/uL — ABNORMAL HIGH (ref 0.1–0.9)
Monocytes: 9 %
NEUTROS PCT: 70 %
Neutrophils Absolute: 7.5 10*3/uL — ABNORMAL HIGH (ref 1.4–7.0)
PLATELETS: 521 10*3/uL — AB (ref 150–379)
RBC: 3.93 x10E6/uL — ABNORMAL LOW (ref 4.14–5.80)
RDW: 16.4 % — ABNORMAL HIGH (ref 12.3–15.4)
WBC: 10.8 10*3/uL (ref 3.4–10.8)

## 2015-03-26 ENCOUNTER — Telehealth: Payer: Self-pay

## 2015-03-26 ENCOUNTER — Other Ambulatory Visit: Payer: Self-pay

## 2015-03-26 NOTE — Telephone Encounter (Signed)
-----   Message from Lucilla Lame, MD sent at 03/25/2015  7:25 AM EDT ----- Danaysia Rader, Let the patient know that his blood count is a little lower then 2 months ago. He should have this followed by his PCP until his colonoscopy can be preformed in 6 weeks.

## 2015-03-26 NOTE — Telephone Encounter (Signed)
Pt notified of results. Advised per Dr. Allen Norris to follow up with Dr. Kary Kos before his colonoscopy.

## 2015-04-08 ENCOUNTER — Other Ambulatory Visit: Payer: Self-pay | Admitting: Gastroenterology

## 2015-04-08 DIAGNOSIS — K5792 Diverticulitis of intestine, part unspecified, without perforation or abscess without bleeding: Secondary | ICD-10-CM

## 2015-04-11 ENCOUNTER — Other Ambulatory Visit: Payer: BLUE CROSS/BLUE SHIELD

## 2015-04-14 ENCOUNTER — Ambulatory Visit
Admission: RE | Admit: 2015-04-14 | Discharge: 2015-04-14 | Disposition: A | Discharge status: Home / Self Care | Payer: BLUE CROSS/BLUE SHIELD | Source: Ambulatory Visit | Attending: Gastroenterology | Admitting: Gastroenterology

## 2015-04-14 DIAGNOSIS — K5792 Diverticulitis of intestine, part unspecified, without perforation or abscess without bleeding: Secondary | ICD-10-CM | POA: Insufficient documentation

## 2015-04-14 DIAGNOSIS — N200 Calculus of kidney: Secondary | ICD-10-CM | POA: Insufficient documentation

## 2015-04-14 DIAGNOSIS — K760 Fatty (change of) liver, not elsewhere classified: Secondary | ICD-10-CM | POA: Diagnosis not present

## 2015-04-14 MED ORDER — IOHEXOL 350 MG/ML SOLN
100.0000 mL | Freq: Once | INTRAVENOUS | Status: AC | PRN
  Administered 2015-04-14: 100 mL via INTRAVENOUS

## 2015-05-05 ENCOUNTER — Inpatient Hospital Stay (HOSPITAL_COMMUNITY)
Admission: EM | Admit: 2015-05-05 | Discharge: 2015-05-11 | DRG: 330 | Disposition: A | Discharge status: Home / Self Care | Payer: BLUE CROSS/BLUE SHIELD | Attending: Internal Medicine | Admitting: Internal Medicine

## 2015-05-05 ENCOUNTER — Emergency Department (HOSPITAL_COMMUNITY): Payer: BLUE CROSS/BLUE SHIELD

## 2015-05-05 ENCOUNTER — Encounter (HOSPITAL_COMMUNITY): Payer: Self-pay | Admitting: Emergency Medicine

## 2015-05-05 DIAGNOSIS — R112 Nausea with vomiting, unspecified: Secondary | ICD-10-CM | POA: Diagnosis present

## 2015-05-05 DIAGNOSIS — R109 Unspecified abdominal pain: Secondary | ICD-10-CM | POA: Diagnosis present

## 2015-05-05 DIAGNOSIS — Z823 Family history of stroke: Secondary | ICD-10-CM

## 2015-05-05 DIAGNOSIS — D638 Anemia in other chronic diseases classified elsewhere: Secondary | ICD-10-CM | POA: Diagnosis present

## 2015-05-05 DIAGNOSIS — D72829 Elevated white blood cell count, unspecified: Secondary | ICD-10-CM | POA: Diagnosis present

## 2015-05-05 DIAGNOSIS — Z8249 Family history of ischemic heart disease and other diseases of the circulatory system: Secondary | ICD-10-CM

## 2015-05-05 DIAGNOSIS — C186 Malignant neoplasm of descending colon: Secondary | ICD-10-CM | POA: Diagnosis present

## 2015-05-05 DIAGNOSIS — K5660 Unspecified intestinal obstruction: Secondary | ICD-10-CM | POA: Diagnosis present

## 2015-05-05 DIAGNOSIS — E876 Hypokalemia: Secondary | ICD-10-CM | POA: Diagnosis present

## 2015-05-05 DIAGNOSIS — C772 Secondary and unspecified malignant neoplasm of intra-abdominal lymph nodes: Secondary | ICD-10-CM

## 2015-05-05 DIAGNOSIS — D500 Iron deficiency anemia secondary to blood loss (chronic): Secondary | ICD-10-CM | POA: Diagnosis present

## 2015-05-05 DIAGNOSIS — D509 Iron deficiency anemia, unspecified: Secondary | ICD-10-CM | POA: Diagnosis present

## 2015-05-05 DIAGNOSIS — Z79899 Other long term (current) drug therapy: Secondary | ICD-10-CM

## 2015-05-05 DIAGNOSIS — G629 Polyneuropathy, unspecified: Secondary | ICD-10-CM | POA: Diagnosis present

## 2015-05-05 DIAGNOSIS — N200 Calculus of kidney: Secondary | ICD-10-CM | POA: Diagnosis present

## 2015-05-05 DIAGNOSIS — K660 Peritoneal adhesions (postprocedural) (postinfection): Secondary | ICD-10-CM | POA: Diagnosis present

## 2015-05-05 DIAGNOSIS — I10 Essential (primary) hypertension: Secondary | ICD-10-CM | POA: Diagnosis present

## 2015-05-05 DIAGNOSIS — K56609 Unspecified intestinal obstruction, unspecified as to partial versus complete obstruction: Secondary | ICD-10-CM

## 2015-05-05 DIAGNOSIS — G4733 Obstructive sleep apnea (adult) (pediatric): Secondary | ICD-10-CM | POA: Diagnosis present

## 2015-05-05 DIAGNOSIS — R100 Acute abdomen: Secondary | ICD-10-CM | POA: Diagnosis not present

## 2015-05-05 DIAGNOSIS — Z6841 Body Mass Index (BMI) 40.0 and over, adult: Secondary | ICD-10-CM

## 2015-05-05 DIAGNOSIS — D860 Sarcoidosis of lung: Secondary | ICD-10-CM | POA: Diagnosis present

## 2015-05-05 DIAGNOSIS — D473 Essential (hemorrhagic) thrombocythemia: Secondary | ICD-10-CM | POA: Diagnosis present

## 2015-05-05 DIAGNOSIS — E669 Obesity, unspecified: Secondary | ICD-10-CM | POA: Diagnosis present

## 2015-05-05 DIAGNOSIS — E662 Morbid (severe) obesity with alveolar hypoventilation: Secondary | ICD-10-CM | POA: Diagnosis present

## 2015-05-05 DIAGNOSIS — K5900 Constipation, unspecified: Secondary | ICD-10-CM

## 2015-05-05 DIAGNOSIS — K6389 Other specified diseases of intestine: Secondary | ICD-10-CM | POA: Diagnosis not present

## 2015-05-05 DIAGNOSIS — C786 Secondary malignant neoplasm of retroperitoneum and peritoneum: Secondary | ICD-10-CM | POA: Diagnosis present

## 2015-05-05 DIAGNOSIS — E668 Other obesity: Secondary | ICD-10-CM | POA: Diagnosis present

## 2015-05-05 DIAGNOSIS — K5732 Diverticulitis of large intestine without perforation or abscess without bleeding: Secondary | ICD-10-CM | POA: Diagnosis present

## 2015-05-05 HISTORY — DX: Iron deficiency anemia secondary to blood loss (chronic): D50.0

## 2015-05-05 HISTORY — DX: Other specified postprocedural states: R11.2

## 2015-05-05 HISTORY — DX: Other specified postprocedural states: Z98.890

## 2015-05-05 LAB — CBC
HCT: 29.4 % — ABNORMAL LOW (ref 39.0–52.0)
HEMOGLOBIN: 8.6 g/dL — AB (ref 13.0–17.0)
MCH: 20.8 pg — AB (ref 26.0–34.0)
MCHC: 29.3 g/dL — ABNORMAL LOW (ref 30.0–36.0)
MCV: 71 fL — ABNORMAL LOW (ref 78.0–100.0)
PLATELETS: 522 10*3/uL — AB (ref 150–400)
RBC: 4.14 MIL/uL — AB (ref 4.22–5.81)
RDW: 21.4 % — ABNORMAL HIGH (ref 11.5–15.5)
WBC: 11.3 10*3/uL — AB (ref 4.0–10.5)

## 2015-05-05 LAB — COMPREHENSIVE METABOLIC PANEL
ALK PHOS: 80 U/L (ref 38–126)
ALT: 25 U/L (ref 17–63)
ANION GAP: 6 (ref 5–15)
AST: 22 U/L (ref 15–41)
Albumin: 3.7 g/dL (ref 3.5–5.0)
BUN: 10 mg/dL (ref 6–20)
CALCIUM: 8.6 mg/dL — AB (ref 8.9–10.3)
CHLORIDE: 107 mmol/L (ref 101–111)
CO2: 25 mmol/L (ref 22–32)
CREATININE: 0.85 mg/dL (ref 0.61–1.24)
Glucose, Bld: 112 mg/dL — ABNORMAL HIGH (ref 65–99)
Potassium: 3.3 mmol/L — ABNORMAL LOW (ref 3.5–5.1)
SODIUM: 138 mmol/L (ref 135–145)
Total Bilirubin: 0.6 mg/dL (ref 0.3–1.2)
Total Protein: 7.8 g/dL (ref 6.5–8.1)

## 2015-05-05 LAB — URINALYSIS, ROUTINE W REFLEX MICROSCOPIC
BILIRUBIN URINE: NEGATIVE
Glucose, UA: NEGATIVE mg/dL
Hgb urine dipstick: NEGATIVE
KETONES UR: 40 mg/dL — AB
LEUKOCYTES UA: NEGATIVE
NITRITE: NEGATIVE
PH: 6.5 (ref 5.0–8.0)
PROTEIN: NEGATIVE mg/dL
Specific Gravity, Urine: >1.046 — ABNORMAL HIGH (ref 1.005–1.030)
UROBILINOGEN UA: 0.2 mg/dL (ref 0.0–1.0)

## 2015-05-05 LAB — LIPASE, BLOOD: LIPASE: 12 U/L — AB (ref 22–51)

## 2015-05-05 LAB — LACTIC ACID, PLASMA: LACTIC ACID, VENOUS: 1.2 mmol/L (ref 0.5–2.0)

## 2015-05-05 MED ORDER — POTASSIUM CHLORIDE 10 MEQ/100ML IV SOLN
10.0000 meq | Freq: Once | INTRAVENOUS | Status: AC
  Administered 2015-05-05: 10 meq via INTRAVENOUS
  Filled 2015-05-05: qty 100

## 2015-05-05 MED ORDER — IOHEXOL 300 MG/ML  SOLN
50.0000 mL | Freq: Once | INTRAMUSCULAR | Status: DC | PRN
  Administered 2015-05-05: 50 mL via ORAL
  Filled 2015-05-05: qty 50

## 2015-05-05 MED ORDER — SODIUM CHLORIDE 0.9 % IV SOLN
INTRAVENOUS | Status: DC
  Administered 2015-05-05 – 2015-05-06 (×2): via INTRAVENOUS

## 2015-05-05 MED ORDER — ONDANSETRON HCL 4 MG/2ML IJ SOLN
4.0000 mg | Freq: Four times a day (QID) | INTRAMUSCULAR | Status: DC | PRN
  Administered 2015-05-05 – 2015-05-06 (×3): 4 mg via INTRAVENOUS
  Filled 2015-05-05 (×3): qty 2

## 2015-05-05 MED ORDER — ONDANSETRON HCL 4 MG PO TABS: 4.0000 mg | ORAL_TABLET | Freq: Four times a day (QID) | ORAL | Status: DC | PRN

## 2015-05-05 MED ORDER — ZOLPIDEM TARTRATE 10 MG PO TABS
10.0000 mg | ORAL_TABLET | Freq: Every day | ORAL | Status: DC
  Administered 2015-05-05 – 2015-05-10 (×6): 10 mg via ORAL
  Filled 2015-05-05 (×6): qty 1

## 2015-05-05 MED ORDER — POTASSIUM CHLORIDE CRYS ER 20 MEQ PO TBCR
40.0000 meq | EXTENDED_RELEASE_TABLET | Freq: Once | ORAL | Status: AC
  Administered 2015-05-05: 40 meq via ORAL
  Filled 2015-05-05: qty 2

## 2015-05-05 MED ORDER — HYDROMORPHONE HCL 1 MG/ML IJ SOLN
1.0000 mg | INTRAMUSCULAR | Status: DC | PRN
  Administered 2015-05-05 – 2015-05-07 (×13): 1 mg via INTRAVENOUS
  Filled 2015-05-05 (×15): qty 1

## 2015-05-05 MED ORDER — SODIUM CHLORIDE 0.9 % IV BOLUS (SEPSIS)
1000.0000 mL | Freq: Once | INTRAVENOUS | Status: AC
  Administered 2015-05-05: 1000 mL via INTRAVENOUS

## 2015-05-05 MED ORDER — ENOXAPARIN SODIUM 40 MG/0.4ML ~~LOC~~ SOLN
40.0000 mg | SUBCUTANEOUS | Status: DC
  Administered 2015-05-05 – 2015-05-10 (×6): 40 mg via SUBCUTANEOUS
  Filled 2015-05-05 (×7): qty 0.4

## 2015-05-05 MED ORDER — IOHEXOL 300 MG/ML  SOLN
100.0000 mL | Freq: Once | INTRAMUSCULAR | Status: AC | PRN
  Administered 2015-05-05: 100 mL via INTRAVENOUS

## 2015-05-05 MED ORDER — ONDANSETRON HCL 4 MG/2ML IJ SOLN
4.0000 mg | Freq: Once | INTRAMUSCULAR | Status: AC
  Administered 2015-05-05: 4 mg via INTRAVENOUS
  Filled 2015-05-05: qty 2

## 2015-05-05 MED ORDER — MORPHINE SULFATE (PF) 4 MG/ML IV SOLN
8.0000 mg | Freq: Once | INTRAVENOUS | Status: AC
  Administered 2015-05-05: 8 mg via INTRAVENOUS
  Filled 2015-05-05: qty 2

## 2015-05-05 NOTE — ED Notes (Signed)
Pt c/o of N/V stomach pain for 6 month. Increased pain over the last week. Unable to keep anything down for 4 days. No rx to assist with N/V. Attempt to get RX from GI doctor however was referred here for evaluation. Pt is seen by [facility]. Colonoscopy scheduled next Tuesday. Last CT scan 2-3 weeks ago.

## 2015-05-05 NOTE — Consult Note (Signed)
Reason for Consult:  Nausea and vomiting for 6 months, nausea and vomiting for 4 days. Referring Physician: Dr. Micheline Chapman (ED)  [name] is an 54 y.o. male.  HPI:  Pt presents to Ed via EMS, with severe pain LUQ, unable to eat, nausea and vomiting for 4 days. Hospitalized at [facility] with diverticulitis back in March 2016, on and off antibiotics since that time. Never recovered enough to undergo colonoscopy. Discharge summary 5/2-01/16/15, for: Persistent acute diverticulitis of large intestine with perforation and abscess, treated with IV antibiotics till 01/24/15.  GI consult 03/24/15 Dr. Lucilla Lame found to have diverticulitis vs possible colon cancer, some rectal bleeding with some ongoing anemia;  50 pound weight loss.  They planned colonoscopy but told if he had worsening abdominal pain or bleeding to go to the ED. He has subsequently seen Dr. Penelope Coop 04/14/15, [facility], and Dr. Leighton Ruff 0/9/47, [facility] for evaluations with ongoing discomfort. He presents now with the abdominal pain that he says is very sharpe, nausea and vomiting some hours after eating or drinking any thing.  This has been ongoing for 4 days.  He and his wife are very frustrated with the whole process.  He  called Dr. Estell Harpin office for something for nausea this AM, and they referred him to [facility] for evaluation for his current symptoms Work up in the Ed shows he is anemic, with elevated WBC, and low K+.  CT scan of the abdomen shows: There appears to be moderate to severe dilatation of the right colon, transverse colon and proximal descending colon with persistent narrowing seen in the distal portion of the descending colon. There appears to be a soft tissue mass in this area concerning for possible neoplasm. This measures 3.4 x 3.1 cm. It appears to extend to an adjacent loop of small bowel, but does not appear to cause small bowel obstruction. Urinary bladder appears normal. There is also a new soft tissue mass in the anterior  peritoneal implant, measuring 29 x 11 mm that is new. This appears to to extend to a loop of small bowel.  Nothing on the current CT to indicate ongoing diverticulitis  We are ask to see.    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  BMI 41 03/21/2015          History reviewed. No pertinent past surgical history.  Family History  Problem Relation Age of Onset  . Arthritis Mother   . Heart disease Mother   . Stroke Father     Social History:  reports that he has never smoked. He has never used smokeless tobacco. He reports that he does not drink alcohol or use illicit drugs.  Allergies:  Allergies  Allergen Reactions  . Fentanyl Nausea And Vomiting  . Versed [Midazolam] Nausea And Vomiting  . Gabapentin     Swallowing problems  . Paba Derivatives Nausea And Vomiting    Pt states he is allergic to unknown anesthesia. Pt has nausea and vomiting with anesthesia.     Prior to Admission medications   Medication Sig Start Date End Date Taking? Authorizing  Provider  AMBIEN 10 MG tablet Take 10 mg by mouth at bedtime as needed for sleep.  02/16/15  Yes Historical Provider, MD  ciprofloxacin (CIPRO) 500 MG tablet Take 1 tablet by mouth 2 (two) times daily. For 14 days, starting 04/22/15.  (2nd course. Initial course started on 04/08/15.) 04/22/15  Yes Historical Provider, MD  metroNIDAZOLE (FLAGYL) 500 MG tablet Take 1 tablet by mouth 2 (two) times daily. For 14 days, starting 04/22/2015. 04/22/15  Yes Historical Provider, MD  pantoprazole (PROTONIX) 40 MG tablet Take 1 tablet (40 mg total) by mouth daily. Patient not taking: Reported  on 05/05/2015 02/19/15   Marlyce Huge, MD     Results for orders placed or performed during the hospital encounter of 05/05/15 (from the past 48 hour(s))  Lipase, blood     Status: Abnormal   Collection Time: 05/05/15  1:28 PM  Result Value Ref Range   Lipase 12 (L) 22 - 51 U/L  Comprehensive metabolic panel     Status: Abnormal   Collection Time: 05/05/15  1:28 PM  Result Value Ref Range   Sodium 138 135 - 145 mmol/L   Potassium 3.3 (L) 3.5 - 5.1 mmol/L   Chloride 107 101 - 111 mmol/L   CO2 25 22 - 32 mmol/L   Glucose, Bld 112 (H) 65 - 99 mg/dL   BUN 10 6 - 20 mg/dL   Creatinine, Ser 0.85 0.61 - 1.24 mg/dL   Calcium 8.6 (L) 8.9 - 10.3 mg/dL   Total Protein 7.8 6.5 - 8.1 g/dL   Albumin 3.7 3.5 - 5.0 g/dL   AST 22 15 - 41 U/L   ALT 25 17 - 63 U/L   Alkaline Phosphatase 80 38 - 126 U/L   Total Bilirubin 0.6 0.3 - 1.2 mg/dL   GFR calc non Af Amer >60 >60 mL/min   GFR calc Af Amer >60 >60 mL/min    Comment: (NOTE) The eGFR has been calculated using the CKD EPI equation. This calculation has not been validated in all clinical situations. eGFR's persistently <60 mL/min signify possible Chronic Kidney Disease.    Anion gap 6 5 - 15  CBC     Status: Abnormal   Collection Time: 05/05/15  1:28 PM  Result Value Ref Range   WBC 11.3 (H) 4.0 - 10.5 K/uL   RBC 4.14 (L) 4.22 - 5.81 MIL/uL   Hemoglobin 8.6 (L) 13.0 - 17.0 g/dL   HCT 29.4 (L) 39.0 - 52.0 %   MCV 71.0 (L) 78.0 - 100.0 fL   MCH 20.8 (L) 26.0 - 34.0 pg   MCHC 29.3 (L) 30.0 - 36.0 g/dL   RDW 21.4 (H) 11.5 - 15.5 %   Platelets 522 (H) 150 - 400 K/uL  Lactic acid, plasma     Status: None   Collection Time: 05/05/15  3:13 PM  Result Value Ref Range   Lactic Acid, Venous 1.2 0.5 - 2.0 mmol/L    Ct Abdomen Pelvis W Contrast  05/05/2015   CLINICAL DATA:  Acute generalized abdominal pain.  EXAM: CT ABDOMEN AND PELVIS WITH CONTRAST  TECHNIQUE: Multidetector CT imaging of the abdomen and pelvis was performed using the  standard protocol following bolus administration of intravenous contrast.  CONTRAST:  172m OMNIPAQUE IOHEXOL 300 MG/ML  SOLN  COMPARISON:  None.  FINDINGS: Visualized lung bases appear normal. No significant osseous abnormality is noted.  No gallstones are noted. The liver, spleen and pancreas appear normal. Adrenal glands appear normal. Bilateral nonobstructive nephrolithiasis  is noted. No hydronephrosis or renal obstruction is noted. No ureteral calculi are noted. There appears to be moderate to severe dilatation of the right colon, transverse colon and proximal descending colon with persistent narrowing seen in the distal portion of the descending colon. There appears to be a soft tissue mass in this area concerning for possible neoplasm. This measures 3.4 x 3.1 cm. It appears to extend to an adjacent loop of small bowel, but does not appear to cause small bowel obstruction. Urinary bladder appears normal. No significant adenopathy is noted. Mass is noted along the anterior portion the peritoneal measuring 29 x 11 mm concerning for carcinomatosis, best seen on image number 61 of series 2.  IMPRESSION: Bilateral nonobstructive nephrolithiasis.  Obstruction of the ascending, transverse and proximal descending colon is noted secondary to probable mass in distal portion of descending colon. This soft tissue abnormality appears to extend to adjacent loop of small bowel. 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 Colonoscopy is recommended to evaluate for possible colonic malignancy.   Electronically Signed   By: Marijo Conception, M.D.   On: 05/05/2015 16:14    Review of Systems  Constitutional: Positive for fever (low grade fevers, around 100 in evenings), chills and weight loss (he says 50 pounds this year).  HENT: Positive for hearing loss.   Eyes:       Glasses   Respiratory: Positive for shortness of breath.        He denies  sleep apnea, sounds like he had a study and was borderline at one time.  Cardiovascular: Positive for claudication. Negative for chest pain, palpitations, orthopnea, leg swelling and PND.  Gastrointestinal: Positive for nausea, vomiting and abdominal pain (diffuse no one place, but says it's very sharpe when it occurs.). Negative for diarrhea, constipation, blood in stool (no recent blood in stools noted.) and melena.       No BM since 04/30/15   Genitourinary: Negative.   Musculoskeletal: Positive for back pain.       Pain in left arm and workup led to sarcoid diagnosis  Skin: Negative.   Neurological: Positive for headaches.  Endo/Heme/Allergies: Negative.   Psychiatric/Behavioral: Negative.    Blood pressure 149/86, pulse 80, temperature 98.2 F (36.8 C), temperature source Oral, resp. rate 20, SpO2 100 %. Physical Exam  Constitutional: He is oriented to person, place, and time. He appears well-developed.  Obese WM, currently in no distress.  He and his wife very frustrated with ongoing issue since 11/2014.  He has been to multiple surgeons/GI doctors in [place] and most recently to Dr. Penelope Coop, [facility], 09/18/03, and Dr. Leighton Ruff 02/19/78. BMI 41  HENT:  Head: Normocephalic and atraumatic.  Eyes: Conjunctivae and EOM are normal. Right eye exhibits no discharge. Left eye exhibits no discharge. No scleral icterus.  Neck: Normal range of motion. Neck supple. No JVD present. No tracheal deviation present. No thyromegaly present.  Cardiovascular: Normal rate, regular rhythm, normal heart sounds and intact distal pulses.   No murmur heard. Respiratory: Effort normal and breath sounds normal. No respiratory distress. He has no wheezes. He has no rales. He exhibits no tenderness.  GI: Soft. Bowel sounds are normal. He exhibits no distension and no mass. There is tenderness (minimal currently, he said right side hurt when I first went in to talk with them.). There is no rebound and no guarding.   Musculoskeletal: He exhibits no edema.  Lymphadenopathy:  He has no cervical adenopathy.  Neurological: He is alert and oriented to person, place, and time. No cranial nerve deficit.  Skin: Skin is warm and dry. No rash noted. No erythema. No pallor.  Psychiatric: He has a normal mood and affect. His behavior is normal. Judgment and thought content normal.    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  Last treatment Sept 2015 - prednisone and methotrexate  Besnier-Boeck disease Hypertension Left arm neuropathy Hx of OSA - Pt says he was never offered Rx BMI 41, with 50 pound weight loss this year Anemia with hx of rectal bleeding, none recently Low back pain  Plan:  Medicine to admit; he needs to be hydrated.  He will need some cleansing enemas, and GI to see and perform colonoscopy for tissue diagnosis of this process. He will need to see Pulmonary for evaluation of his Sarcoid. I would keep him NPO for now and if he has further nausea and vomiting he may need an NG for decompression.  I will defer to GI enema's and bowel prep.  We will follow with you.  Dr. Zella Richer has reviewed his films and examined the patient with me in the ED.  Kesa Birky 05/05/2015, 4:33 PM

## 2015-05-05 NOTE — ED Notes (Signed)
RN pulled Lactic acid

## 2015-05-05 NOTE — ED Notes (Signed)
Bed: SB97 Expected date:  Expected time:  Means of arrival:  Comments: EMS- abdominal pain, bedridden

## 2015-05-05 NOTE — ED Provider Notes (Signed)
CSN: [id number]     Arrival date & time 05/05/15  1248 History   First MD Initiated Contact with Patient 05/05/15 1342     Chief Complaint  Patient presents with  . Abdominal Pain     (Consider location/radiation/quality/duration/timing/severity/associated sxs/prior Treatment) Patient is a 54 y.o. male presenting with abdominal pain. The history is provided by the patient.  Abdominal Pain Pain location:  LUQ Pain quality: sharp, shooting and squeezing   Pain radiates to:  Does not radiate Pain severity:  Severe Onset quality:  Gradual Duration:  2 days Timing:  Constant Progression:  Worsening Chronicity:  Recurrent Relieved by:  Nothing Worsened by:  Position changes Ineffective treatments:  None tried Associated symptoms: anorexia, constipation, nausea and vomiting   Associated symptoms: no chest pain, no chills, no diarrhea, no fever, no flatus and no shortness of breath   Risk factors: obesity   Risk factors: has not had multiple surgeries    54 yo M  with a chief complaint of abdominal pain. Patient states is been off and on problem for the past 9 months or so. Patient has been seen by GI multiple times. Patient has a possible diverticulitis at this been treated with antibiotics and has persisted. There is some concern that this is a primary colon cancer the patient is being followed by GI as well as surgery to evaluate whether or not he needs surgery based on a colonoscopy. However been waiting for the inflammation to resolve to perform this.  Pain recurred yesterday sudden sharp worsening. Without bowel movement since this. Denies flatus. Significant amount of belching has vomited some dark colored material. Patient denies bilious or bloody. Unable to tolerate by mouth  Past Medical History  Diagnosis Date  . Hypertension   . Sarcoid   . Neuropathy   . Obstructive apnea 04/26/2013  . Diverticulitis large intestine 01/13/2015  . Diverticulitis of colon 02/19/2015  . 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 Extreme obesity 12/21/2012  . LBP (low back pain) 05/28/2014  . H/O disease 03/21/2015  . Adiposity 03/21/2015   History reviewed. No pertinent past surgical history. Family History  Problem Relation Age of Onset  . Arthritis Mother   . Heart disease Mother   . Stroke Father    Social History  Substance Use Topics  . Smoking status: Never Smoker   . Smokeless tobacco: Never Used  . Alcohol Use: No    Review of Systems  Constitutional: Negative for fever and chills.  HENT: Negative for congestion and facial swelling.   Eyes: Negative for discharge and visual disturbance.  Respiratory: Negative for shortness of breath.   Cardiovascular: Negative for chest pain and palpitations.  Gastrointestinal: Positive for nausea, vomiting, abdominal pain, constipation and anorexia. Negative for diarrhea and flatus.  Musculoskeletal: Negative for myalgias and arthralgias.  Skin: Negative for color change and rash.  Neurological: Negative for tremors, syncope and headaches.  Psychiatric/Behavioral: Negative for confusion and dysphoric mood.      Allergies  Fentanyl; Versed; Gabapentin; and Paba derivatives  Home Medications   Prior to Admission medications   Medication Sig Start Date End Date Taking? Authorizing Provider  AMBIEN 10 MG tablet Take 10 mg by mouth at bedtime as needed for sleep.  02/16/15  Yes Historical Provider, MD  ciprofloxacin (CIPRO) 500 MG tablet Take 1 tablet by mouth 2 (two) times daily. For 14 days, starting 04/22/15.  (2nd course. Initial course started on 04/08/15.) 04/22/15  Yes Historical Provider, MD  metroNIDAZOLE (FLAGYL) 500 MG tablet Take 1 tablet by mouth 2 (two) times daily. For 14 days, starting 04/22/2015. 04/22/15  Yes Historical Provider, MD  pantoprazole (PROTONIX) 40 MG tablet Take 1 tablet (40 mg total) by mouth daily. Patient not taking: Reported on 05/05/2015 02/19/15   Marlyce Huge, MD   BP 149/86 mmHg  Pulse 80  Temp(Src) 98.2 F (36.8 C) (Oral)  Resp 20  SpO2 100% Physical Exam  Constitutional: He is oriented to person, place, and time. He appears well-developed and well-nourished.  HENT:  Head: Normocephalic and atraumatic.  Eyes: EOM are normal. Pupils are equal, round, and reactive to light.  Neck: Normal range of motion. Neck supple. No JVD present.  Cardiovascular: Normal rate and regular rhythm.  Exam reveals no gallop and no friction rub.   No murmur heard. Pulmonary/Chest: No respiratory distress. He has no wheezes.  Abdominal: He exhibits no distension. There is tenderness (worst in epigastrium and LUQ). There is no rebound and no guarding.  Musculoskeletal: Normal range of motion.  Neurological: He is alert and oriented to person, place, and time.  Skin: No rash noted. No pallor.  Psychiatric: He has a normal mood and affect. His behavior is normal.    ED Course  Procedures (including critical care time) Labs Review Labs Reviewed  LIPASE, BLOOD - Abnormal; Notable for the following:    Lipase 12 (*)    All other components within normal limits  COMPREHENSIVE METABOLIC PANEL - Abnormal; Notable for the following:    Potassium 3.3 (*)    Glucose, Bld 112 (*)    Calcium 8.6 (*)    All other components within normal limits  CBC - Abnormal; Notable for the following:    WBC 11.3 (*)    RBC 4.14 (*)    Hemoglobin 8.6 (*)    HCT 29.4 (*)    MCV 71.0 (*)    MCH 20.8 (*)    MCHC 29.3 (*)    RDW 21.4 (*)    Platelets 522 (*)    All other components within normal limits  LACTIC ACID, PLASMA  LACTIC ACID, PLASMA  URINALYSIS, ROUTINE W REFLEX MICROSCOPIC (NOT AT [facility])    Imaging Review Ct Abdomen Pelvis W Contrast  05/05/2015   CLINICAL DATA:  Acute generalized abdominal pain.  EXAM: CT ABDOMEN AND PELVIS WITH CONTRAST  TECHNIQUE: Multidetector CT imaging of the abdomen and pelvis was performed using the standard protocol following  bolus administration of intravenous contrast.  CONTRAST:  158mL OMNIPAQUE IOHEXOL 300 MG/ML  SOLN  COMPARISON:  None.  FINDINGS: Visualized lung bases appear normal. No significant osseous abnormality is noted.  No gallstones are noted. The liver, spleen and pancreas appear normal. Adrenal glands appear normal. Bilateral nonobstructive nephrolithiasis is noted. No hydronephrosis or renal obstruction is noted. No ureteral calculi are noted. There appears to be moderate to severe dilatation of the right colon, transverse colon and proximal descending colon with persistent narrowing seen in the distal portion of the descending colon. There appears to be a soft tissue mass in this area concerning for possible neoplasm. This measures 3.4 x 3.1 cm. It appears to extend to an adjacent loop of small bowel, but does not appear to cause small bowel obstruction. Urinary bladder appears normal. No significant adenopathy is noted. Mass is noted along the anterior portion the peritoneal measuring 29 x 11 mm concerning for carcinomatosis, best seen on image number 61 of series 2.  IMPRESSION:  Bilateral nonobstructive nephrolithiasis.  Obstruction of the ascending, transverse and proximal descending colon is noted secondary to probable mass in distal portion of descending colon. This soft tissue abnormality appears to extend to adjacent loop of small bowel. 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 Colonoscopy is recommended to evaluate for possible colonic malignancy.   Electronically Signed   By: Marijo Conception, M.D.   On: 05/05/2015 16:14   I have personally reviewed and evaluated these images and lab results as part of my medical decision-making.   EKG Interpretation None      MDM   Final diagnoses:  Large bowel obstruction  Nausea and vomiting, vomiting of unspecified type  Constipation, unspecified constipation type    54 yo M with  a chief complaint of abdominal pain. Concern for possible bowel obstruction will obtain a CT scan with contrast. CBC CMP lipase lactate.  Patient with unremarkable labs.  Awaiting CT abd and pelvis.    Turned over to Dr. Thomasene Lot.    Deno Etienne, DO 05/05/15 1751

## 2015-05-05 NOTE — ED Notes (Addendum)
Increased weakness and abdominal pain has been worsening over last 6 months. Called PCP for Rx and was told to report to ED.  Diverticulitis and mass in stomach, was scheduled to have colonoscopy when 'stomach healed' Has infection that is resistant to antibiotics, possibly in lungs.

## 2015-05-05 NOTE — H&P (Addendum)
[facility] History and Physical  [name] DVV:[id number] DOB: 30-Jan-1961 DOA: 05/05/2015  Referring physician: ED PA, [name]  PCP: [name] Pink, MD   Chief Complaint: abd pain   HPI:  54 y.o. Male with multiple medical conditions including HTN, obesity, sarcoid, presented to [facility] ED with main concern of of 4-5 days duration of progressively worsening LUQ pain, constant, occasionally but not consistently radiating to epigastric area, worse with eating and drinking, no specific alleviating factors, associated with nausea and non bloody vomiting. Pt explains he has been hospitalized in March at [facility] for similar problem and has been on and off ABX since that time. Pt was also hospitalized in May 2016 for management of acute diverticulitis with perforation and abscess and was treated with IV ABX> Pt reports he has never really fully recovered. He called his GI doctor, [name] and was told to go to ED for further evaluation.   Work up in the Ed notable for WBC 11.3, K 3.3, Hg 8.6. CT scan of the abdomen shows moderate to severe dilatation of the right colon, transverse colon and proximal descending colon with persistent narrowing in the distal portion of the descending colon, concern of possible neoplasm, 3.4 x 3.1 cm. Surgery and GI team consulted.   Assessment and Plan: Principal Problem:   Abdominal pain, acute, LUQ - please note multiple findings on CT including colon obstruction with ? Mass in the distal colon - provide IVF, keep NPO - if he develops nausea or vomiting, will consider NGT - awaiting GI input, colonoscopy is recommended for further evaluation  - provide analgesia and antiemetics as needed   Active Problems:   Leukocytosis - most likely secondary to the above - repeat CBC in AM    Non obstructive nephrolithiasis - no indication for urologic intervention at this time     Hypokalemia - secondary to vomiting - supplement and repeat BMP in AM   Anemia of chronic disease - review of records indicated Hg ~ 9 in May 2016 - will repeat CBC i AM    Morbid obesity - Body mass index is 41.16 kg/(m^2).   DVT prophylaxis - Lovenox SQ  Radiological Exams on Admission: Ct Abdomen Pelvis W Contrast 05/05/2015    Bilateral nonobstructive nephrolithiasis.  Obstruction of the ascending, transverse and proximal descending colon is noted secondary to probable mass in distal portion of descending colon. This soft tissue abnormality appears to extend to adjacent loop of small bowel. 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 Colonoscopy is recommended to evaluate for possible colonic malignancy.  Code Status: Full Family Communication: Pt and family at bedside Disposition Plan: Admit for further evaluation    [name] [facility] [phone]  Review of Systems:  Constitutional: Negative for fever, chills. Negative for diaphoresis.  HENT: Negative for hearing loss, ear pain, nosebleeds, neck pain, tinnitus and ear discharge.   Eyes: Negative for blurred vision, double vision, photophobia, pain, discharge and redness.  Respiratory: Negative for cough, hemoptysis, sputum production, shortness of breath, wheezing and stridor.   Cardiovascular: Negative for chest pain, palpitations, orthopnea, claudication and leg swelling.  Gastrointestinal: Per HPI Genitourinary: Negative for dysuria, urgency, frequency, hematuria and flank pain.  Musculoskeletal: Negative for myalgias, back pain, joint pain and falls.  Skin: Negative for itching and rash.  Neurological: Negative for dizziness and weakness.  Endo/Heme/Allergies: Negative for environmental allergies and polydipsia. Does not bruise/bleed easily.  Psychiatric/Behavioral: Negative for suicidal ideas. The  patient is not nervous/anxious.      Past Medical History  Diagnosis Date  . Hypertension   . Sarcoid   . Neuropathy   .  Obstructive apnea 04/26/2013  . Diverticulitis large intestine 01/13/2015  . Diverticulitis of colon 02/19/2015  . 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 Extreme obesity 12/21/2012  . LBP (low back pain) 05/28/2014  . H/O disease 03/21/2015  . Adiposity 03/21/2015    History reviewed. No pertinent past surgical history.  Social History:  reports that he has never smoked. He has never used smokeless tobacco. He reports that he does not drink alcohol or use illicit drugs.  Allergies  Allergen Reactions  . Fentanyl Nausea And Vomiting  . Versed [Midazolam] Nausea And Vomiting  . Gabapentin     Swallowing problems  . Paba Derivatives Nausea And Vomiting    Pt states he is allergic to unknown anesthesia. Pt has nausea and vomiting with anesthesia.     Family History  Problem Relation Age of Onset  . Arthritis Mother   . Heart disease Mother   . Stroke Father     Prior to Admission medications   Medication Sig Start Date End Date Taking? Authorizing Provider  AMBIEN 10 MG tablet Take 10 mg by mouth at bedtime as needed for sleep.  02/16/15  Yes Historical Provider, MD  ciprofloxacin (CIPRO) 500 MG tablet Take 1 tablet by mouth 2 (two) times daily. For 14 days, starting 04/22/15.  (2nd course. Initial course started on 04/08/15.) 04/22/15  Yes Historical Provider, MD  metroNIDAZOLE (FLAGYL) 500 MG tablet Take 1 tablet by mouth 2 (two) times daily. For 14 days, starting 04/22/2015. 04/22/15  Yes Historical Provider, MD  pantoprazole (PROTONIX) 40 MG tablet Take 1 tablet (40 mg total) by mouth daily. Patient not taking: Reported on 05/05/2015 02/19/15   Marlyce Huge, MD    Physical Exam: Filed Vitals:   05/05/15 1251 05/05/15 1513  BP: 151/54 149/86  Pulse: 85 80  Temp: 98.2 F (36.8 C)   TempSrc: Oral   Resp: 20 20  SpO2: 99% 100%    Physical Exam  Constitutional: Appears well-developed and well-nourished. No distress.  HENT:  Normocephalic. External right and left ear normal. Dry MM Eyes: Conjunctivae and EOM are normal. PERRLA, no scleral icterus.  Neck: Normal ROM. Neck supple. No JVD. No tracheal deviation. No thyromegaly.  CVS: RRR, S1/S2 +, no gallops, no carotid bruit.  Pulmonary: Effort and breath sounds normal, no stridor, rhonchi, wheezes, rales.  Abdominal: Soft. BS +,  no distension, tenderness in upper abd quadrants, no rebound or guarding.  Musculoskeletal: Normal range of motion. No edema and no tenderness.  Lymphadenopathy: No lymphadenopathy noted, cervical, inguinal. Neuro: Alert. Normal reflexes, muscle tone coordination. No cranial nerve deficit. Skin: Skin is warm and dry. No rash noted. Not diaphoretic. No erythema. No pallor.  Psychiatric: Normal mood and affect. Behavior, judgment, thought content normal.   Labs on Admission:  Basic Metabolic Panel:  Recent Labs Lab 05/05/15 1328  NA 138  K 3.3*  CL 107  CO2 25  GLUCOSE 112*  BUN 10  CREATININE 0.85  CALCIUM 8.6*   Liver Function Tests:  Recent Labs Lab 05/05/15 1328  AST 22  ALT 25  ALKPHOS 80  BILITOT 0.6  PROT 7.8  ALBUMIN 3.7    Recent Labs Lab 05/05/15 1328  LIPASE 12*   No results for input(s): AMMONIA in the last 168 hours. CBC:  Recent Labs Lab 05/05/15 1328  WBC 11.3*  HGB 8.6*  HCT 29.4*  MCV 71.0*  PLT 522*   EKG: pending    If 7PM-7AM, please contact night-coverage [email or web address] Password [facility] 05/05/2015, 4:48 PM

## 2015-05-06 DIAGNOSIS — R52 Pain, unspecified: Secondary | ICD-10-CM

## 2015-05-06 DIAGNOSIS — D860 Sarcoidosis of lung: Secondary | ICD-10-CM | POA: Diagnosis present

## 2015-05-06 DIAGNOSIS — D72829 Elevated white blood cell count, unspecified: Secondary | ICD-10-CM

## 2015-05-06 DIAGNOSIS — R100 Acute abdomen: Secondary | ICD-10-CM

## 2015-05-06 DIAGNOSIS — R109 Unspecified abdominal pain: Secondary | ICD-10-CM

## 2015-05-06 DIAGNOSIS — D638 Anemia in other chronic diseases classified elsewhere: Secondary | ICD-10-CM

## 2015-05-06 LAB — BASIC METABOLIC PANEL
ANION GAP: 6 (ref 5–15)
BUN: 10 mg/dL (ref 6–20)
CO2: 25 mmol/L (ref 22–32)
Calcium: 8.4 mg/dL — ABNORMAL LOW (ref 8.9–10.3)
Chloride: 109 mmol/L (ref 101–111)
Creatinine, Ser: 0.85 mg/dL (ref 0.61–1.24)
Glucose, Bld: 89 mg/dL (ref 65–99)
POTASSIUM: 3.6 mmol/L (ref 3.5–5.1)
SODIUM: 140 mmol/L (ref 135–145)

## 2015-05-06 LAB — PREPARE RBC (CROSSMATCH)

## 2015-05-06 LAB — ABO/RH: ABO/RH(D): O POS

## 2015-05-06 LAB — CBC
HEMATOCRIT: 27.3 % — AB (ref 39.0–52.0)
Hemoglobin: 7.6 g/dL — ABNORMAL LOW (ref 13.0–17.0)
MCH: 20.2 pg — ABNORMAL LOW (ref 26.0–34.0)
MCHC: 27.8 g/dL — ABNORMAL LOW (ref 30.0–36.0)
MCV: 72.4 fL — ABNORMAL LOW (ref 78.0–100.0)
PLATELETS: 478 10*3/uL — AB (ref 150–400)
RBC: 3.77 MIL/uL — AB (ref 4.22–5.81)
RDW: 21.3 % — AB (ref 11.5–15.5)
WBC: 10.1 10*3/uL (ref 4.0–10.5)

## 2015-05-06 MED ORDER — FLEET ENEMA 7-19 GM/118ML RE ENEM
1.0000 | ENEMA | RECTAL | Status: AC
  Administered 2015-05-06 (×2): 1 via RECTAL
  Filled 2015-05-06 (×2): qty 1

## 2015-05-06 MED ORDER — SODIUM CHLORIDE 0.9 % IV SOLN
Freq: Once | INTRAVENOUS | Status: AC
  Administered 2015-05-06: 15:00:00 via INTRAVENOUS

## 2015-05-06 NOTE — Consult Note (Signed)
[facility] CONSULT Reason for consult: obstructedcolon Referring Physician: [facility]. PCP: Dr. Kary Kos. Primary G.I.: Dr. Harlin Rain [name] is an 54 y.o. male.  HPI: the patient self referred to Dr. Penelope Coop recently for follow-up diverticulitis. He was 1st diagnosed in March this year when he was admitted [facility]. He was treated with antibiotics and sent home with oral antibiotics. CT scan from 3/16 showed thickening of the colonic wall in the mid-descending colon with diverticuli with pericolonic  stranding consistent with diverticulitis. Apparently there was a question of micro perforation and this was while he was given antibiotics. He saw surgeons, gastroenterologist in [place] and a colonoscopy was plan but was delayed due to concerns about perforating: in the case of active diverticulitis. And he was continued on antibiotics. He became sicker and never really felt that he was getting well. He has had several CT scans which apparently showed continued diverticulitis. He saw Dr. Penelope Coop in the office because he and his wife felt that he was not getting better. The time that he saw Dr. Penelope Coop a couple weeks ago his abdominal exam is not that remarkable. Elective outpatient colonoscopy was arranged. It was scheduled for next week. The patient called and that he was becoming more distended and having worsening pain and he was told to come to the emergency room where repeat CT scan showed a colonic mass in the descending colon possibly involving the adjacent loop of small bowel with markedly dilated proximal colon. There was also what was felt to be a peritoneal implant. The patient has had very little bowel movement or flatus but lots of burping and distention. Patient has been seen by surgery. Concern is that this could be a tumor rather than diverticulitis. He also has a history of sarcoid and not really sure exactly what this means.  Past Medical History  Diagnosis Date  .  Hypertension   . Sarcoid   . Neuropathy   . Obstructive apnea 04/26/2013  . Diverticulitis large intestine 01/13/2015  . Diverticulitis of colon 02/19/2015  . 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 Extreme obesity 12/21/2012  . LBP (low back pain) 05/28/2014  . H/O disease 03/21/2015  . Adiposity 03/21/2015    History reviewed. No pertinent past surgical history.  Family History  Problem Relation Age of Onset  . Arthritis Mother   . Heart disease Mother   . Stroke Father     Social History:  reports that he has never smoked. He has never used smokeless tobacco. He reports that he does not drink alcohol or use illicit drugs.  Allergies:  Allergies  Allergen Reactions  . Fentanyl Nausea And Vomiting  . Versed [Midazolam] Nausea And Vomiting  . Gabapentin     Swallowing problems  . Paba Derivatives Nausea And Vomiting    Pt states he is allergic to unknown anesthesia. Pt has nausea and vomiting with anesthesia.     Medications; Prior to Admission medications   Medication Sig Start Date End Date Taking? Authorizing Provider  AMBIEN 10 MG tablet Take 10 mg by mouth at bedtime as needed for sleep.  02/16/15  Yes Historical Provider, MD  ciprofloxacin (CIPRO) 500 MG tablet Take 1 tablet by mouth 2 (two) times daily. For 14 days, starting 04/22/15.  (2nd course. Initial course started on 04/08/15.) 04/22/15  Yes Historical Provider, MD  metroNIDAZOLE (FLAGYL) 500 MG tablet Take 1 tablet by mouth 2 (two) times daily. For 14 days, starting  04/22/2015. 04/22/15  Yes Historical Provider, MD  pantoprazole (PROTONIX) 40 MG tablet Take 1 tablet (40 mg total) by mouth daily. Patient not taking: Reported on 05/05/2015 02/19/15   Marlyce Huge, MD   . enoxaparin (LOVENOX) injection  40 mg Subcutaneous Q24H  . sodium phosphate  1 enema Rectal Q4H  . zolpidem  10 mg Oral QHS   PRN Meds HYDROmorphone (DILAUDID) injection, ondansetron **OR** ondansetron  (ZOFRAN) IV Results for orders placed or performed during the hospital encounter of 05/05/15 (from the past 48 hour(s))  Lipase, blood     Status: Abnormal   Collection Time: 05/05/15  1:28 PM  Result Value Ref Range   Lipase 12 (L) 22 - 51 U/L  Comprehensive metabolic panel     Status: Abnormal   Collection Time: 05/05/15  1:28 PM  Result Value Ref Range   Sodium 138 135 - 145 mmol/L   Potassium 3.3 (L) 3.5 - 5.1 mmol/L   Chloride 107 101 - 111 mmol/L   CO2 25 22 - 32 mmol/L   Glucose, Bld 112 (H) 65 - 99 mg/dL   BUN 10 6 - 20 mg/dL   Creatinine, Ser 0.85 0.61 - 1.24 mg/dL   Calcium 8.6 (L) 8.9 - 10.3 mg/dL   Total Protein 7.8 6.5 - 8.1 g/dL   Albumin 3.7 3.5 - 5.0 g/dL   AST 22 15 - 41 U/L   ALT 25 17 - 63 U/L   Alkaline Phosphatase 80 38 - 126 U/L   Total Bilirubin 0.6 0.3 - 1.2 mg/dL   GFR calc non Af Amer >60 >60 mL/min   GFR calc Af Amer >60 >60 mL/min    Comment: (NOTE) The eGFR has been calculated using the CKD EPI equation. This calculation has not been validated in all clinical situations. eGFR's persistently <60 mL/min signify possible Chronic Kidney Disease.    Anion gap 6 5 - 15  CBC     Status: Abnormal   Collection Time: 05/05/15  1:28 PM  Result Value Ref Range   WBC 11.3 (H) 4.0 - 10.5 K/uL   RBC 4.14 (L) 4.22 - 5.81 MIL/uL   Hemoglobin 8.6 (L) 13.0 - 17.0 g/dL   HCT 29.4 (L) 39.0 - 52.0 %   MCV 71.0 (L) 78.0 - 100.0 fL   MCH 20.8 (L) 26.0 - 34.0 pg   MCHC 29.3 (L) 30.0 - 36.0 g/dL   RDW 21.4 (H) 11.5 - 15.5 %   Platelets 522 (H) 150 - 400 K/uL  Lactic acid, plasma     Status: None   Collection Time: 05/05/15  3:13 PM  Result Value Ref Range   Lactic Acid, Venous 1.2 0.5 - 2.0 mmol/L  Urinalysis, Routine w reflex microscopic (not at [facility])     Status: Abnormal   Collection Time: 05/05/15  5:19 PM  Result Value Ref Range   Color, Urine YELLOW YELLOW   APPearance CLEAR CLEAR   Specific Gravity, Urine >1.046 (H) 1.005 - 1.030   pH 6.5 5.0 - 8.0    Glucose, UA NEGATIVE NEGATIVE mg/dL   Hgb urine dipstick NEGATIVE NEGATIVE   Bilirubin Urine NEGATIVE NEGATIVE   Ketones, ur 40 (A) NEGATIVE mg/dL   Protein, ur NEGATIVE NEGATIVE mg/dL   Urobilinogen, UA 0.2 0.0 - 1.0 mg/dL   Nitrite NEGATIVE NEGATIVE   Leukocytes, UA NEGATIVE NEGATIVE    Comment: MICROSCOPIC NOT DONE ON URINES WITH NEGATIVE PROTEIN, BLOOD, LEUKOCYTES, NITRITE, OR GLUCOSE <1000 mg/dL.  Basic metabolic panel  Status: Abnormal   Collection Time: 05/06/15  4:08 AM  Result Value Ref Range   Sodium 140 135 - 145 mmol/L   Potassium 3.6 3.5 - 5.1 mmol/L   Chloride 109 101 - 111 mmol/L   CO2 25 22 - 32 mmol/L   Glucose, Bld 89 65 - 99 mg/dL   BUN 10 6 - 20 mg/dL   Creatinine, Ser 0.85 0.61 - 1.24 mg/dL   Calcium 8.4 (L) 8.9 - 10.3 mg/dL   GFR calc non Af Amer >60 >60 mL/min   GFR calc Af Amer >60 >60 mL/min    Comment: (NOTE) The eGFR has been calculated using the CKD EPI equation. This calculation has not been validated in all clinical situations. eGFR's persistently <60 mL/min signify possible Chronic Kidney Disease.    Anion gap 6 5 - 15  CBC     Status: Abnormal   Collection Time: 05/06/15  4:08 AM  Result Value Ref Range   WBC 10.1 4.0 - 10.5 K/uL   RBC 3.77 (L) 4.22 - 5.81 MIL/uL   Hemoglobin 7.6 (L) 13.0 - 17.0 g/dL   HCT 27.3 (L) 39.0 - 52.0 %   MCV 72.4 (L) 78.0 - 100.0 fL   MCH 20.2 (L) 26.0 - 34.0 pg   MCHC 27.8 (L) 30.0 - 36.0 g/dL   RDW 21.3 (H) 11.5 - 15.5 %   Platelets 478 (H) 150 - 400 K/uL    Ct Abdomen Pelvis W Contrast  05/05/2015   CLINICAL DATA:  Acute generalized abdominal pain.  EXAM: CT ABDOMEN AND PELVIS WITH CONTRAST  TECHNIQUE: Multidetector CT imaging of the abdomen and pelvis was performed using the standard protocol following bolus administration of intravenous contrast.  CONTRAST:  121m OMNIPAQUE IOHEXOL 300 MG/ML  SOLN  COMPARISON:  None.  FINDINGS: Visualized lung bases appear normal. No significant osseous abnormality is  noted.  No gallstones are noted. The liver, spleen and pancreas appear normal. Adrenal glands appear normal. Bilateral nonobstructive nephrolithiasis is noted. No hydronephrosis or renal obstruction is noted. No ureteral calculi are noted. There appears to be moderate to severe dilatation of the right colon, transverse colon and proximal descending colon with persistent narrowing seen in the distal portion of the descending colon. There appears to be a soft tissue mass in this area concerning for possible neoplasm. This measures 3.4 x 3.1 cm. It appears to extend to an adjacent loop of small bowel, but does not appear to cause small bowel obstruction. Urinary bladder appears normal. No significant adenopathy is noted. Mass is noted along the anterior portion the peritoneal measuring 29 x 11 mm concerning for carcinomatosis, best seen on image number 61 of series 2.  IMPRESSION: Bilateral nonobstructive nephrolithiasis.  Obstruction of the ascending, transverse and proximal descending colon is noted secondary to probable mass in distal portion of descending colon. This soft tissue abnormality appears to extend to adjacent loop of small bowel. 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 Colonoscopy is recommended to evaluate for possible colonic malignancy.   Electronically Signed   By: JMarijo Conception M.D.   On: 05/05/2015 16:14              Blood pressure 141/78, pulse 64, temperature 97.5 F (36.4 C), temperature source Oral, resp. rate 20, height 5' 11"  (1.803 m), weight 136.986 kg (302 lb), SpO2 98 %.  Physical exam:   General-- alert oriented white male, obese ENT-- nonicteric Neck-- obese neck  Heart-- regular rate and rhythm without murmurs or gallops Lungs-- grossly clear Abdomen-- obese and slightly distended with good bowel sounds. There is some upper abdominal tenderness.  Assessment: 1. Colonic obstruction.  There is mass effect at the descending colon that may well be a colonic tumor rather than diverticulitis. It's possible this could involve adjacent small bowel. I agree we need to evaluate this endoscopically. I think he clearly will need surgery. He has been treated for diverticulitis for several months now and has gotten progressively worse in spite of appropriate treatment for diverticulitis.  Plan: 1. Agree with keeping him NPO now. 2. Aggressive enema therapy and will plan on sigmoidoscopy tomorrow at 830. Have discussed this with the patient and he is agreeable.   Carlota Philley JR,Iliyah Bui L 05/06/2015, 9:42 AM   Pager: [phone] If no answer or after hours call [phone]

## 2015-05-06 NOTE — Progress Notes (Addendum)
Subjective: Still having abdominal pain which is controlled with IV analgesic.  No vomiting.  No BM.  Objective: Vital signs in last 24 hours: Temp:  [97.5 F (36.4 C)-98.3 F (36.8 C)] 97.5 F (36.4 C) (08/23 0658) Pulse Rate:  [64-85] 64 (08/23 0658) Resp:  [20] 20 (08/23 0658) BP: (135-151)/(54-86) 141/78 mmHg (08/23 0658) SpO2:  [97 %-100 %] 98 % (08/23 0658) Weight:  [133.811 kg (295 lb)-136.986 kg (302 lb)] 136.986 kg (302 lb) (08/23 0704) Last BM Date: 05/01/15  Intake/Output from previous day:   Intake/Output this shift:    PE: General- In NAD Abdomen-soft, obese, not tender, no palpable mass  Lab Results:   Recent Labs  05/05/15 1328 05/06/15 0408  WBC 11.3* 10.1  HGB 8.6* 7.6*  HCT 29.4* 27.3*  PLT 522* 478*   BMET  Recent Labs  05/05/15 1328 05/06/15 0408  NA 138 140  K 3.3* 3.6  CL 107 109  CO2 25 25  GLUCOSE 112* 89  BUN 10 10  CREATININE 0.85 0.85  CALCIUM 8.6* 8.4*   PT/INR No results for input(s): LABPROT, INR in the last 72 hours. Comprehensive Metabolic Panel:    Component Value Date/Time   NA 140 05/06/2015 0408   NA 138 05/05/2015 1328   NA 138 01/02/2015 1555   NA 138 11/20/2012 1410   K 3.6 05/06/2015 0408   K 3.3* 05/05/2015 1328   K 3.7 01/02/2015 1555   K 3.2* 11/20/2012 1410   CL 109 05/06/2015 0408   CL 107 05/05/2015 1328   CL 109 01/02/2015 1555   CL 104 11/20/2012 1410   CO2 25 05/06/2015 0408   CO2 25 05/05/2015 1328   CO2 22 01/02/2015 1555   CO2 27 11/20/2012 1410   BUN 10 05/06/2015 0408   BUN 10 05/05/2015 1328   BUN 9 01/02/2015 1555   BUN 9 11/20/2012 1410   CREATININE 0.85 05/06/2015 0408   CREATININE 0.85 05/05/2015 1328   CREATININE 1.04 01/02/2015 1555   CREATININE 0.91 11/20/2012 1410   GLUCOSE 89 05/06/2015 0408   GLUCOSE 112* 05/05/2015 1328   GLUCOSE 110* 01/02/2015 1555   GLUCOSE 95 11/20/2012 1410   CALCIUM 8.4* 05/06/2015 0408   CALCIUM 8.6* 05/05/2015 1328   CALCIUM 8.8*  01/02/2015 1555   CALCIUM 8.1* 11/20/2012 1410   AST 22 05/05/2015 1328   AST 27 01/14/2015 0604   AST 39 01/02/2015 1555   AST 24 11/20/2012 1410   ALT 25 05/05/2015 1328   ALT 33 01/14/2015 0604   ALT 55 01/02/2015 1555   ALT 38 11/20/2012 1410   ALKPHOS 80 05/05/2015 1328   ALKPHOS 80 01/14/2015 0604   ALKPHOS 125 01/02/2015 1555   ALKPHOS 123 11/20/2012 1410   BILITOT 0.6 05/05/2015 1328   BILITOT 0.3 01/14/2015 0604   BILITOT 0.5 01/02/2015 1555   BILITOT 0.5 11/20/2012 1410   PROT 7.8 05/05/2015 1328   PROT 7.0 01/14/2015 0604   PROT 8.2* 01/02/2015 1555   PROT 7.7 11/20/2012 1410   ALBUMIN 3.7 05/05/2015 1328   ALBUMIN 3.3* 01/14/2015 0604   ALBUMIN 4.0 01/02/2015 1555   ALBUMIN 3.1* 11/20/2012 1410     Studies/Results: Ct Abdomen Pelvis W Contrast  05/05/2015   CLINICAL DATA:  Acute generalized abdominal pain.  EXAM: CT ABDOMEN AND PELVIS WITH CONTRAST  TECHNIQUE: Multidetector CT imaging of the abdomen and pelvis was performed using the standard protocol following bolus administration of intravenous contrast.  CONTRAST:  130mL OMNIPAQUE IOHEXOL 300  MG/ML  SOLN  COMPARISON:  None.  FINDINGS: Visualized lung bases appear normal. No significant osseous abnormality is noted.  No gallstones are noted. The liver, spleen and pancreas appear normal. Adrenal glands appear normal. Bilateral nonobstructive nephrolithiasis is noted. No hydronephrosis or renal obstruction is noted. No ureteral calculi are noted. There appears to be moderate to severe dilatation of the right colon, transverse colon and proximal descending colon with persistent narrowing seen in the distal portion of the descending colon. There appears to be a soft tissue mass in this area concerning for possible neoplasm. This measures 3.4 x 3.1 cm. It appears to extend to an adjacent loop of small bowel, but does not appear to cause small bowel obstruction. Urinary bladder appears normal. No significant adenopathy is  noted. Mass is noted along the anterior portion the peritoneal measuring 29 x 11 mm concerning for carcinomatosis, best seen on image number 61 of series 2.  IMPRESSION: Bilateral nonobstructive nephrolithiasis.  Obstruction of the ascending, transverse and proximal descending colon is noted secondary to probable mass in distal portion of descending colon. This soft tissue abnormality appears to extend to adjacent loop of small bowel. 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 Colonoscopy is recommended to evaluate for possible colonic malignancy.   Electronically Signed   By: Marijo Conception, M.D.   On: 05/05/2015 16:14    Anti-infectives: Anti-infectives    None      Assessment 1.  Obstructing distal colon lesion involving small bowel on CT-cannot tell if this is an extrinsic or intrinsic process; also has a peritoneal mass suggesting the possibility of carcinomatosis 2.  Sarcoidosis 3.  Anemia-hemoglobin down to 7.6  LOS: 1 day   Rec:  GI consult for colonoscopy.  Pulmonary consult to evaluate sarcoidosis and operative risk.  Suspect he will require a Hartmann procedure later this week which may be palliative is he has carcinomatosis.  Will order tumor markers.   Bentleigh Waren Lenna Sciara 05/06/2015

## 2015-05-06 NOTE — Consult Note (Signed)
PULMONARY / CRITICAL CARE MEDICINE   Name: ERMON SAGAN MRN: [id number] DOB: 1961-01-13    ADMISSION DATE:  05/05/2015 CONSULTATION DATE:  05/06/2015  REFERRING MD :  [facility]  CHIEF COMPLAINT:  Pulmonary evaluation preoperatively for sarcoidosis  INITIAL PRESENTATION: 54 year old male admitted on 05/05/2015 with a CT scan worrisome for a colonic mass. Pulmonary and critical care medicine was consulted on 05/06/2015 for evaluation of perioperative pulmonary risk.  STUDIES:  CT abdomen pelvis August 22: Bilateral nonobstructive nephrolithiasis, obstruction of the ascending, transverse, and proximal descending colon noted secondary to a probable mass in the distal portion of the descending colon. Also findings worrisome for carcinomatosis  SIGNIFICANT EVENTS:    HISTORY OF PRESENT ILLNESS: This is a 54 year old male with a past medical history significant for pulmonary sarcoidosis who was admitted to the [facility] on 05/05/2015 with abdominal pain and a CT scan which was worrisome for obstruction of his colon secondary to a mass. He tells me that he was diagnosed with sarcoidosis when it flared up in 2014. Upon my review of his records through [facility] I have been able to see that he was diagnosed with a transbronchial needle aspiration of a lymph node in 2014 which showed noncaseating granulomas. He was treated initially with prednisone which she said helped his shortness of breath and fatigue. However, after the prednisone was discontinued he had persistence of fatigue as well as numbness throughout his arms and legs. He was treated with methotrexate. He says that the neurologic symptoms did not improve and so he went to [facility] clinic for further evaluation in January 2015. I was able to review the records from there as well and it sounds as if they were not convinced that there was neurologic sarcoid but felt that he may be depressed and have obstructive sleep apnea.  He was not treated with anything more aggressive there. He said he had another "flareup" in September or October 2015And was treated with about a month of prednisone. Since then his pulmonary symptoms have been stable. He says he does not have shortness of breath, cough, or other pulmonary complaints. 2016 has been marked more by abdominal pains and problems with diverticulitis rather than significant respiratory issues. He does not exercise routinely. He tells me that he does not have obstructive sleep apnea. He said he was tested in 2009 and apparently this test was negative. He says he does not have daytime sleepiness, morning headaches. He says he snores but he has never been told that he has witnessed apneas.  He came to the emergency department on 05/05/2015 complaining of progressive left lower quadrant abdominal pain nausea and vomiting.    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  has no past surgical history on file. Prior to Admission medications   Medication Sig Start Date End Date Taking? Authorizing Provider  AMBIEN 10 MG tablet Take 10 mg by mouth at bedtime as needed for sleep.  02/16/15  Yes Historical Provider, MD  ciprofloxacin (CIPRO) 500 MG tablet Take 1 tablet by mouth 2 (two) times daily. For 14 days, starting 04/22/15.  (2nd course. Initial course started on 04/08/15.) 04/22/15  Yes Historical Provider, MD  metroNIDAZOLE (FLAGYL) 500 MG tablet Take 1 tablet by mouth  2 (two) times daily. For 14 days, starting 04/22/2015. 04/22/15  Yes Historical Provider, MD  pantoprazole (PROTONIX) 40 MG tablet Take 1 tablet (40 mg total) by mouth  daily. Patient not taking: Reported on 05/05/2015 02/19/15   Marlyce Huge, MD   Allergies  Allergen Reactions  . Fentanyl Nausea And Vomiting  . Versed [Midazolam] Nausea And Vomiting  . Gabapentin     Swallowing problems  . Paba Derivatives Nausea And Vomiting    Pt states he is allergic to unknown anesthesia. Pt has nausea and vomiting with anesthesia.     FAMILY HISTORY:  has no family status information on file.  SOCIAL HISTORY:  reports that he has never smoked. He has never used smokeless tobacco. He reports that he does not drink alcohol or use illicit drugs.  REVIEW OF SYSTEMS:  Gen: Denies fever, chills, weight change, fatigue, night sweats HEENT: Denies blurred vision, double vision, hearing loss, tinnitus, sinus congestion, rhinorrhea, sore throat, neck stiffness, dysphagia PULM: Denies shortness of breath, cough, sputum production, hemoptysis, wheezing CV: Denies chest pain, edema, orthopnea, paroxysmal nocturnal dyspnea, palpitations GI: per HPI GU: Denies dysuria, hematuria, polyuria, oliguria, urethral discharge Endocrine: Denies hot or cold intolerance, polyuria, polyphagia or appetite change Derm: Denies rash, dry skin, scaling or peeling skin change Heme: Denies easy bruising, bleeding, bleeding gums Neuro: Denies headache, numbness, weakness, slurred speech, loss of memory or consciousness   SUBJECTIVE:   VITAL SIGNS: Temp:  [97.5 F (36.4 C)-98.3 F (36.8 C)] 97.5 F (36.4 C) (08/23 0658) Pulse Rate:  [64-85] 64 (08/23 0658) Resp:  [20] 20 (08/23 0658) BP: (135-151)/(54-86) 141/78 mmHg (08/23 0658) SpO2:  [97 %-100 %] 98 % (08/23 0658) Weight:  [295 lb (133.811 kg)-302 lb (136.986 kg)] 302 lb (136.986 kg) (08/23 0704) HEMODYNAMICS:   VENTILATOR SETTINGS:   INTAKE / OUTPUT: No intake or output data in the 24 hours ending 05/06/15 1208  PHYSICAL EXAMINATION: General:   OBESE, NO ACUTE DISTRESS Neuro: Awake alert in bed, moves all 4  extremities HENT:  NCAT, EOM Cardiovascular:  RRR, no mgr Lungs:  Clear to auscultation bilaterally GI: BS+, soft, nontender Musculoskeletal: Normal bulk and tone Skin:  No rash or skin breakdown  LABS:  CBC  Recent Labs Lab 05/05/15 1328 05/06/15 0408  WBC 11.3* 10.1  HGB 8.6* 7.6*  HCT 29.4* 27.3*  PLT 522* 478*   Coag's No results for input(s): APTT, INR in the last 168 hours. BMET  Recent Labs Lab 05/05/15 1328 05/06/15 0408  NA 138 140  K 3.3* 3.6  CL 107 109  CO2 25 25  BUN 10 10  CREATININE 0.85 0.85  GLUCOSE 112* 89   Electrolytes  Recent Labs Lab 05/05/15 1328 05/06/15 0408  CALCIUM 8.6* 8.4*   Sepsis Markers  Recent Labs Lab 05/05/15 1513  LATICACIDVEN 1.2   ABG No results for input(s): PHART, PCO2ART, PO2ART in the last 168 hours. Liver Enzymes  Recent Labs Lab 05/05/15 1328  AST 22  ALT 25  ALKPHOS 80  BILITOT 0.6  ALBUMIN 3.7   Cardiac Enzymes No results for input(s): TROPONINI, PROBNP in the last 168 hours. Glucose No results for input(s): GLUCAP in the last 168 hours.  Imaging Ct Abdomen Pelvis W Contrast  05/05/2015   CLINICAL DATA:  Acute generalized abdominal pain.  EXAM: CT ABDOMEN AND PELVIS WITH CONTRAST  TECHNIQUE: Multidetector CT imaging of the abdomen and pelvis was performed using the standard protocol following bolus administration of intravenous contrast.  CONTRAST:  175mL OMNIPAQUE IOHEXOL 300 MG/ML  SOLN  COMPARISON:  None.  FINDINGS: Visualized lung bases appear normal. No significant osseous abnormality is noted.  No gallstones are noted. The liver, spleen and pancreas appear normal. Adrenal glands appear normal. Bilateral nonobstructive nephrolithiasis is noted. No hydronephrosis or renal obstruction is noted. No ureteral calculi are noted. There appears to be moderate to severe dilatation of the right colon, transverse colon and proximal descending colon with persistent narrowing seen in the distal portion of  the descending colon. There appears to be a soft tissue mass in this area concerning for possible neoplasm. This measures 3.4 x 3.1 cm. It appears to extend to an adjacent loop of small bowel, but does not appear to cause small bowel obstruction. Urinary bladder appears normal. No significant adenopathy is noted. Mass is noted along the anterior portion the peritoneal measuring 29 x 11 mm concerning for carcinomatosis, best seen on image number 61 of series 2.  IMPRESSION: Bilateral nonobstructive nephrolithiasis.  Obstruction of the ascending, transverse and proximal descending colon is noted secondary to probable mass in distal portion of descending colon. This soft tissue abnormality appears to extend to adjacent loop of small bowel. 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 Colonoscopy is recommended to evaluate for possible colonic malignancy.   Electronically Signed   By: Marijo Conception, M.D.   On: 05/05/2015 16:25 Jan 2015 chest x-ray images personally reviewed showing normal pulmonary parenchyma, slightly low lung volumes, mild cardiomegaly  ASSESSMENT / PLAN:  PULMONARY We have been asked to consult regarding his perioperative risk considering his underlying diagnosis of pulmonary sarcoidosis. I have personally reviewed the records from his office visits at [facility] as well as at the [facility] clinic. His most recent pulmonary function test was in January 2015 which showed a normal diffusion capacity and only moderate reduction in his FVC. Further, his most recent CT chest in 2015 showed no evidence of pulmonary parenchymal abnormality. He does not use oxygen and he has had no respiratory complaints lately. Therefore his perioperative risk from a sarcoid standpoint is minimal.  He carries a diagnosis of obstructive sleep apnea but he denies having this after being tested in 2008 or 2009. However, he has a high STOP-BANG  questionnaire score and his physical exam is suggestive of obstructive sleep apnea. Care should be taken by the anesthesiologist when considering intubation and extubation. He may need perioperative CPAP while sedated.   Plan: Proceed with surgery Out of bed as soon as possible after surgery Incentive spirometry after surgery May need CPAP perioperatively considering high likelihood of obstructive sleep apnea O2 as needed post operatively to maintain O2 saturation > 92 %  05/06/2015, 12:08 PM

## 2015-05-06 NOTE — Progress Notes (Signed)
Initial Nutrition Assessment  DOCUMENTATION CODES:   Morbid obesity  INTERVENTION:   Diet advancement per MD When diet advanced, recommend Boost Breeze po TID, each supplement provides 250 kcal and 9 grams of protein RD to continue to monitor  NUTRITION DIAGNOSIS:   Inadequate oral intake related to inability to eat as evidenced by NPO status.  GOAL:   Patient will meet greater than or equal to 90% of their needs  MONITOR:   Diet advancement, Labs, Weight trends, Skin, I & O's  REASON FOR ASSESSMENT:   Malnutrition Screening Tool    ASSESSMENT:   54 year old male admitted on 05/05/2015 with a CT scan worrisome for a colonic mass. Pulmonary and critical care medicine was consulted on 05/06/2015 for evaluation of perioperative pulmonary risk.  Pt reports poor appetite since March 2016. Pt states that he has steadily lost weight without intention. However, weight loss is insignificant given time frame. Pt with no PO x 4-5 days PTA d/t N/V. Pt currently NPO for procedures. Possible surgery on Friday per pt.   Pt would like nutritional supplement when diet is advanced.  Nutrition focused physical exam shows no sign of depletion of muscle mass or body fat.  Labs reviewed.  Diet Order:  Diet NPO time specified Except for: Ice Chips  Skin:  Reviewed, no issues  Last BM:  8/23  Height:   Ht Readings from Last 1 Encounters:  05/05/15 5\' 11"  (1.803 m)    Weight:   Wt Readings from Last 1 Encounters:  05/06/15 302 lb (136.986 kg)    Ideal Body Weight:  78.2 kg  BMI:  Body mass index is 42.14 kg/(m^2).  Estimated Nutritional Needs:   Kcal:  7948-0165  Protein:  90-100g  Fluid:  2.4L/day  EDUCATION NEEDS:   No education needs identified at this time  Clayton Bibles, MS, RD, LDN Pager: [phone] After Hours Pager: [phone]

## 2015-05-06 NOTE — Progress Notes (Addendum)
[name], male   DOB: 1960-10-11, 54 y.o.   MRN: [id number]  [facility] PROGRESS NOTE  [name] EHU:[id number] DOB: 1960-10-11 DOA: 05/05/2015 PCP: [name], MD   Brief narrative:    54 y.o. Male with multiple medical conditions including HTN, obesity, sarcoid, presented to [facility] ED with main concern of of 4-5 days duration of progressively worsening LUQ pain, constant, occasionally but not consistently radiating to epigastric area, worse with eating and drinking, no specific alleviating factors, associated with nausea and non bloody vomiting. Pt explains he has been hospitalized in March at [facility] for similar problem and has been on and off ABX since that time. Pt was also hospitalized in May 2016 for management of acute diverticulitis with perforation and abscess and was treated with IV ABX> Pt reports he has never really fully recovered. He called his GI doctor, Dr. Penelope Coop and was told to go to ED for further evaluation.   Work up in the Ed notable for WBC 11.3, K 3.3, Hg 8.6. CT scan of the abdomen shows moderate to severe dilatation of the right colon, transverse colon and proximal descending colon with persistent narrowing in the distal portion of the descending colon, concern of possible neoplasm, 3.4 x 3.1 cm. Surgery and GI team consulted.   Assessment/Plan:    Principal Problem:  Abdominal pain, acute, LUQ - please note multiple findings on CT including colon obstruction with mass effect on the descending colon - GI team recommends aggressive enema therapy with plan on sigmoidoscopy tomorrow at 8:30 - keep NPO for now  - if he develops nausea or vomiting, will consider NGT - provide analgesia and antiemetics as needed  - surgery team anticipates need for Hartmann's procedure sometimes this weeks - PCCM consulted for clearance   Active Problems:  Leukocytosis - most likely secondary to the above - resolved  - repeat CBC in AM   Non  obstructive nephrolithiasis - no indication for urologic intervention at this time    Hypokalemia - secondary to vomiting - supplemented and WNL this AM    Anemia acute bleeding with anemia of chronic disease - review of records indicated Hg ~ 9 in May 2016 - drop in Hg overnight, 8.6 -> 7.6, transfuse one unit PRBC today  - will repeat CBC i AM   Morbid obesity - Body mass index is 41.16 kg/(m^2)   Thrombocytosis - possibly reactive in the setting ou acute illness outlined above - CBC in AM  DVT prophylaxis - Lovenox SQ  Code Status: Full.  Family Communication:  plan of care discussed with the patient Disposition Plan: Home when stable.   IV access:  Peripheral IV  Procedures and diagnostic studies:    Ct Abdomen Pelvis W Contrast  05/05/2015   CLINICAL DATA:  Acute generalized abdominal pain.  EXAM: CT ABDOMEN AND PELVIS WITH CONTRAST  TECHNIQUE: Multidetector CT imaging of the abdomen and pelvis was performed using the standard protocol following bolus administration of intravenous contrast.  CONTRAST:  137mL OMNIPAQUE IOHEXOL 300 MG/ML  SOLN  COMPARISON:  None.  FINDINGS: Visualized lung bases appear normal. No significant osseous abnormality is noted.  No gallstones are noted. The liver, spleen and pancreas appear normal. Adrenal glands appear normal. Bilateral nonobstructive nephrolithiasis is noted. No hydronephrosis or renal obstruction is noted. No ureteral calculi are noted. There appears to be moderate to severe dilatation of the right colon, transverse colon and proximal descending colon with persistent narrowing seen in the  distal portion of the descending colon. There appears to be a soft tissue mass in this area concerning for possible neoplasm. This measures 3.4 x 3.1 cm. It appears to extend to an adjacent loop of small bowel, but does not appear to cause small bowel obstruction. Urinary bladder appears normal. No significant adenopathy is noted. Mass is noted along  the anterior portion the peritoneal measuring 29 x 11 mm concerning for carcinomatosis, best seen on image number 61 of series 2.  IMPRESSION: Bilateral nonobstructive nephrolithiasis.  Obstruction of the ascending, transverse and proximal descending colon is noted secondary to probable mass in distal portion of descending colon. This soft tissue abnormality appears to extend to adjacent loop of small bowel. 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 Colonoscopy is recommended to evaluate for possible colonic malignancy.   Electronically Signed   By: Marijo Conception, M.D.   On: 05/05/2015 16:14   Ct Abdomen Pelvis W Contrast  04/14/2015   CLINICAL DATA:  Left lower quadrant pain. Severe nausea and vomiting. Diverticulitis. Nephrolithiasis.  EXAM: CT ABDOMEN AND PELVIS WITH CONTRAST  TECHNIQUE: Multidetector CT imaging of the abdomen and pelvis was performed using the standard protocol following bolus administration of intravenous contrast.  CONTRAST:  161mL OMNIPAQUE IOHEXOL 350 MG/ML SOLN  COMPARISON:  02/07/2015 and 12/17/2014  FINDINGS: Lower Chest: No acute findings.  Hepatobiliary: Diffuse hepatic steatosis again demonstrated. No liver masses are identified. Gallbladder is unremarkable.  Pancreas: No mass, inflammatory changes, or other significant abnormality identified.  Spleen:  Within normal limits in size and appearance.  Adrenals:  No masses identified.  Kidneys/Urinary Tract: No evidence of masses or hydronephrosis. Small bilateral less than 5 mm nonobstructive renal calculi are again seen.  Stomach/Bowel/Peritoneum: Short segment concentric wall thickening is again seen involving the distal descending colon, with soft tissue stranding in the adjacent pericolonic fat. There is also wall thickening and involving an adjacent loop of small bowel. As this shows no significant change compared to several previous exams, differential  diagnosis includes both colon carcinoma and refractory diverticulitis. There is no evidence of colonic or small bowel obstruction. No evidence of abscess or free fluid.  Vascular/Lymphatic: No pathologically enlarged lymph nodes identified. No abdominal aortic aneurysm or other significant retroperitoneal abnormality demonstrated.  Reproductive:  No mass or other significant abnormality identified.  Other:  None.  Musculoskeletal:  No suspicious bone lesions identified.  IMPRESSION: Persistent short segment concentric wall thickening involving the distal descending colon, with pericolonic stranding and wall thickening involving an adjacent small bowel loop. Differential diagnosis includes both colon carcinoma and refractory diverticulitis. Colonoscopy or surgical evaluation should be considered.  No evidence of abscess or bowel obstruction.  No evidence of metastatic disease.  Diffuse hepatic steatosis.  Bilateral nonobstructive nephrolithiasis.   Electronically Signed   By: Earle Gell M.D.   On: 04/14/2015 16:04    Medical Consultants:  PCCM Surgery GI  Other Consultants:  None  IAnti-Infectives:   None  Faye Ramsay, MD  [facility] Pager [phone]  If 7PM-7AM, please contact night-coverage [email or web address] Password [email or web address] 05/06/2015, 8:46 AM   LOS: 1 day   HPI/Subjective: No events overnight.   Objective: Filed Vitals:   05/05/15 1814 05/05/15 2045 05/06/15 0658 05/06/15 0704  BP: 135/79 139/66 141/78   Pulse: 70 75 64   Temp: 98.3 F (36.8 C) 97.9 F (36.6 C) 97.5 F (36.4 C)   TempSrc: Oral Oral Oral   Resp: 20 20 20  Height: 5\' 11"  (1.803 m)     Weight: 133.811 kg (295 lb)   136.986 kg (302 lb)  SpO2: 99% 97% 98%    No intake or output data in the 24 hours ending 05/06/15 0846  Exam:   General:  Pt is alert, follows commands appropriately, not in acute distress  Cardiovascular: Regular rate and rhythm, no rubs, no gallops  Respiratory: Clear to auscultation  bilaterally, no wheezing, diminished breath sounds at bases   Abdomen: Soft, non tender, distended, bowel sounds present, no guarding  Extremities: No edema, pulses DP and PT palpable bilaterally  Neuro: Grossly nonfocal  Data Reviewed: Basic Metabolic Panel:  Recent Labs Lab 05/05/15 1328 05/06/15 0408  NA 138 140  K 3.3* 3.6  CL 107 109  CO2 25 25  GLUCOSE 112* 89  BUN 10 10  CREATININE 0.85 0.85  CALCIUM 8.6* 8.4*   Liver Function Tests:  Recent Labs Lab 05/05/15 1328  AST 22  ALT 25  ALKPHOS 80  BILITOT 0.6  PROT 7.8  ALBUMIN 3.7    Recent Labs Lab 05/05/15 1328  LIPASE 12*   CBC:  Recent Labs Lab 05/05/15 1328 05/06/15 0408  WBC 11.3* 10.1  HGB 8.6* 7.6*  HCT 29.4* 27.3*  MCV 71.0* 72.4*  PLT 522* 478*    Scheduled Meds: . enoxaparin (LOVENOX) injection  40 mg Subcutaneous Q24H  . sodium phosphate  1 enema Rectal Q4H  . zolpidem  10 mg Oral QHS   Continuous Infusions: . sodium chloride 75 mL/hr at 05/06/15 [phone]

## 2015-05-07 ENCOUNTER — Encounter (HOSPITAL_COMMUNITY)
Admission: EM | Disposition: A | Discharge status: Home / Self Care | Payer: Self-pay | Source: Home / Self Care | Attending: Internal Medicine

## 2015-05-07 ENCOUNTER — Inpatient Hospital Stay (HOSPITAL_COMMUNITY): Payer: BLUE CROSS/BLUE SHIELD | Admitting: Registered Nurse

## 2015-05-07 ENCOUNTER — Encounter (HOSPITAL_COMMUNITY): Payer: Self-pay

## 2015-05-07 DIAGNOSIS — R112 Nausea with vomiting, unspecified: Secondary | ICD-10-CM | POA: Diagnosis present

## 2015-05-07 DIAGNOSIS — K6389 Other specified diseases of intestine: Secondary | ICD-10-CM

## 2015-05-07 DIAGNOSIS — D509 Iron deficiency anemia, unspecified: Secondary | ICD-10-CM | POA: Diagnosis present

## 2015-05-07 DIAGNOSIS — E876 Hypokalemia: Secondary | ICD-10-CM

## 2015-05-07 HISTORY — PX: FLEXIBLE SIGMOIDOSCOPY: SHX5431

## 2015-05-07 HISTORY — PX: LAPAROSCOPIC PARTIAL COLECTOMY: SHX5907

## 2015-05-07 LAB — CBC
HCT: 30.4 % — ABNORMAL LOW (ref 39.0–52.0)
Hemoglobin: 8.8 g/dL — ABNORMAL LOW (ref 13.0–17.0)
MCH: 21.5 pg — ABNORMAL LOW (ref 26.0–34.0)
MCHC: 28.9 g/dL — AB (ref 30.0–36.0)
MCV: 74.1 fL — ABNORMAL LOW (ref 78.0–100.0)
Platelets: 453 10*3/uL — ABNORMAL HIGH (ref 150–400)
RBC: 4.1 MIL/uL — ABNORMAL LOW (ref 4.22–5.81)
RDW: 21.1 % — AB (ref 11.5–15.5)
WBC: 9.3 10*3/uL (ref 4.0–10.5)

## 2015-05-07 LAB — BASIC METABOLIC PANEL
ANION GAP: 9 (ref 5–15)
BUN: 9 mg/dL (ref 6–20)
CALCIUM: 8.1 mg/dL — AB (ref 8.9–10.3)
CO2: 22 mmol/L (ref 22–32)
CREATININE: 0.86 mg/dL (ref 0.61–1.24)
Chloride: 108 mmol/L (ref 101–111)
GFR calc non Af Amer: >60 mL/min (ref >60)
Glucose, Bld: 81 mg/dL (ref 65–99)
Potassium: 3.6 mmol/L (ref 3.5–5.1)
SODIUM: 139 mmol/L (ref 135–145)

## 2015-05-07 LAB — IRON AND TIBC
IRON: 13 ug/dL — AB (ref 45–182)
Saturation Ratios: 4 % — ABNORMAL LOW (ref 17.9–39.5)
TIBC: 356 ug/dL (ref 250–450)
UIBC: 343 ug/dL

## 2015-05-07 LAB — CEA: CEA: 1.5 ng/mL (ref 0.0–4.7)

## 2015-05-07 LAB — SURGICAL PCR SCREEN
MRSA, PCR: NEGATIVE
Staphylococcus aureus: NEGATIVE

## 2015-05-07 LAB — CANCER ANTIGEN 19-9

## 2015-05-07 LAB — FERRITIN: FERRITIN: 8 ng/mL — AB (ref 24–336)

## 2015-05-07 SURGERY — SIGMOIDOSCOPY, FLEXIBLE
Anesthesia: Moderate Sedation

## 2015-05-07 SURGERY — LAPAROSCOPIC PARTIAL COLECTOMY
Anesthesia: General | Site: Abdomen

## 2015-05-07 MED ORDER — DEXTROSE 5 % IV SOLN
1.0000 g | Freq: Two times a day (BID) | INTRAVENOUS | Status: AC
  Administered 2015-05-07 – 2015-05-08 (×2): 1 g via INTRAVENOUS
  Filled 2015-05-07 (×2): qty 1

## 2015-05-07 MED ORDER — NEOSTIGMINE METHYLSULFATE 10 MG/10ML IV SOLN
INTRAVENOUS | Status: AC
  Filled 2015-05-07: qty 1

## 2015-05-07 MED ORDER — FENTANYL CITRATE (PF) 100 MCG/2ML IJ SOLN
INTRAMUSCULAR | Status: DC | PRN
  Administered 2015-05-07 (×4): 25 ug via INTRAVENOUS

## 2015-05-07 MED ORDER — SUGAMMADEX SODIUM 200 MG/2ML IV SOLN
INTRAVENOUS | Status: AC
  Filled 2015-05-07: qty 2

## 2015-05-07 MED ORDER — ONDANSETRON HCL 4 MG/2ML IJ SOLN
INTRAMUSCULAR | Status: DC | PRN
  Administered 2015-05-07: 4 mg via INTRAVENOUS

## 2015-05-07 MED ORDER — ROCURONIUM BROMIDE 100 MG/10ML IV SOLN
INTRAVENOUS | Status: AC
  Filled 2015-05-07: qty 1

## 2015-05-07 MED ORDER — DIPHENHYDRAMINE HCL 12.5 MG/5ML PO ELIX: 12.5000 mg | ORAL_SOLUTION | Freq: Four times a day (QID) | ORAL | Status: DC | PRN

## 2015-05-07 MED ORDER — MIDAZOLAM HCL 5 MG/ML IJ SOLN
INTRAMUSCULAR | Status: AC
  Filled 2015-05-07: qty 1

## 2015-05-07 MED ORDER — PANTOPRAZOLE SODIUM 40 MG IV SOLR
40.0000 mg | Freq: Every day | INTRAVENOUS | Status: DC
  Administered 2015-05-07 – 2015-05-10 (×4): 40 mg via INTRAVENOUS
  Filled 2015-05-07 (×5): qty 40

## 2015-05-07 MED ORDER — MORPHINE SULFATE 1 MG/ML IV SOLN
INTRAVENOUS | Status: DC
  Administered 2015-05-07: 18.81 mg via INTRAVENOUS
  Administered 2015-05-07: 21:00:00 via INTRAVENOUS
  Administered 2015-05-07: 18 mg via INTRAVENOUS
  Administered 2015-05-08: 15:00:00 via INTRAVENOUS
  Administered 2015-05-08: 7.3 mg via INTRAVENOUS
  Administered 2015-05-08: 7.5 mg via INTRAVENOUS
  Administered 2015-05-08: 18 mg via INTRAVENOUS
  Administered 2015-05-08: 29.79 mg via INTRAVENOUS
  Administered 2015-05-08: 10.5 mg via INTRAVENOUS
  Administered 2015-05-08 (×2): via INTRAVENOUS
  Administered 2015-05-08: 14.77 mg via INTRAVENOUS
  Administered 2015-05-09 (×2): 10.5 mg via INTRAVENOUS
  Administered 2015-05-09: 20:00:00 via INTRAVENOUS
  Administered 2015-05-09: 12 mg via INTRAVENOUS
  Administered 2015-05-09: 19.32 mg via INTRAVENOUS
  Administered 2015-05-09: 11:00:00 via INTRAVENOUS
  Administered 2015-05-09: 1.5 mg via INTRAVENOUS
  Administered 2015-05-10: 9 mg via INTRAVENOUS
  Administered 2015-05-10 (×3): 3 mg via INTRAVENOUS
  Filled 2015-05-07 (×7): qty 25

## 2015-05-07 MED ORDER — MORPHINE SULFATE 1 MG/ML IV SOLN
INTRAVENOUS | Status: AC
  Administered 2015-05-07: 18:00:00
  Filled 2015-05-07: qty 25

## 2015-05-07 MED ORDER — CEFOTETAN DISODIUM-DEXTROSE 2-2.08 GM-% IV SOLR
INTRAVENOUS | Status: AC
  Filled 2015-05-07: qty 50

## 2015-05-07 MED ORDER — GLYCOPYRROLATE 0.2 MG/ML IJ SOLN
INTRAMUSCULAR | Status: AC
  Filled 2015-05-07: qty 1

## 2015-05-07 MED ORDER — LIDOCAINE HCL (CARDIAC) 20 MG/ML IV SOLN
INTRAVENOUS | Status: AC
  Filled 2015-05-07: qty 5

## 2015-05-07 MED ORDER — GLYCOPYRROLATE 0.2 MG/ML IJ SOLN
INTRAMUSCULAR | Status: AC
  Filled 2015-05-07: qty 4

## 2015-05-07 MED ORDER — FENTANYL CITRATE (PF) 100 MCG/2ML IJ SOLN
INTRAMUSCULAR | Status: AC
  Filled 2015-05-07: qty 4

## 2015-05-07 MED ORDER — BUPIVACAINE HCL (PF) 0.5 % IJ SOLN
INTRAMUSCULAR | Status: AC
  Filled 2015-05-07: qty 30

## 2015-05-07 MED ORDER — SUCCINYLCHOLINE CHLORIDE 20 MG/ML IJ SOLN
INTRAMUSCULAR | Status: DC | PRN
  Administered 2015-05-07: 120 mg via INTRAVENOUS

## 2015-05-07 MED ORDER — PROPOFOL 10 MG/ML IV BOLUS
INTRAVENOUS | Status: AC
  Filled 2015-05-07: qty 20

## 2015-05-07 MED ORDER — NALOXONE HCL 0.4 MG/ML IJ SOLN: 0.4000 mg | INTRAMUSCULAR | Status: DC | PRN

## 2015-05-07 MED ORDER — HYDROMORPHONE HCL 1 MG/ML IJ SOLN
INTRAMUSCULAR | Status: AC
  Filled 2015-05-07: qty 1

## 2015-05-07 MED ORDER — LACTATED RINGERS IV SOLN
INTRAVENOUS | Status: DC | PRN
  Administered 2015-05-07 (×2): via INTRAVENOUS

## 2015-05-07 MED ORDER — DIPHENHYDRAMINE HCL 50 MG/ML IJ SOLN: 12.5000 mg | Freq: Four times a day (QID) | INTRAMUSCULAR | Status: DC | PRN

## 2015-05-07 MED ORDER — SODIUM CHLORIDE 0.9 % IJ SOLN: 9.0000 mL | INTRAMUSCULAR | Status: DC | PRN

## 2015-05-07 MED ORDER — DEXAMETHASONE SODIUM PHOSPHATE 10 MG/ML IJ SOLN
INTRAMUSCULAR | Status: AC
  Filled 2015-05-07: qty 1

## 2015-05-07 MED ORDER — LIDOCAINE HCL (CARDIAC) 20 MG/ML IV SOLN
INTRAVENOUS | Status: DC | PRN
  Administered 2015-05-07: 100 mg via INTRAVENOUS

## 2015-05-07 MED ORDER — LACTATED RINGERS IV SOLN
INTRAVENOUS | Status: DC
Start: 2015-05-07 — End: 2015-05-07

## 2015-05-07 MED ORDER — FENTANYL CITRATE (PF) 100 MCG/2ML IJ SOLN
INTRAMUSCULAR | Status: DC | PRN
  Administered 2015-05-07 (×7): 50 ug via INTRAVENOUS

## 2015-05-07 MED ORDER — DEXAMETHASONE SODIUM PHOSPHATE 10 MG/ML IJ SOLN
INTRAMUSCULAR | Status: DC | PRN
  Administered 2015-05-07: 10 mg via INTRAVENOUS

## 2015-05-07 MED ORDER — SUGAMMADEX SODIUM 500 MG/5ML IV SOLN
INTRAVENOUS | Status: DC | PRN
Start: 2015-05-07 — End: 2015-05-07
  Administered 2015-05-07: 200 mg via INTRAVENOUS

## 2015-05-07 MED ORDER — ROCURONIUM BROMIDE 100 MG/10ML IV SOLN
INTRAVENOUS | Status: DC | PRN
  Administered 2015-05-07: 30 mg via INTRAVENOUS
  Administered 2015-05-07: 10 mg via INTRAVENOUS
  Administered 2015-05-07 (×2): 20 mg via INTRAVENOUS
  Administered 2015-05-07 (×2): 10 mg via INTRAVENOUS

## 2015-05-07 MED ORDER — POLYVINYL ALCOHOL 1.4 % OP SOLN
2.0000 [drp] | OPHTHALMIC | Status: DC | PRN
  Filled 2015-05-07: qty 15

## 2015-05-07 MED ORDER — GLYCOPYRROLATE 0.2 MG/ML IJ SOLN
INTRAMUSCULAR | Status: DC | PRN
  Administered 2015-05-07: 0.2 mg via INTRAVENOUS

## 2015-05-07 MED ORDER — ONDANSETRON HCL 4 MG/2ML IJ SOLN
INTRAMUSCULAR | Status: AC
  Filled 2015-05-07: qty 2

## 2015-05-07 MED ORDER — PROPOFOL 10 MG/ML IV BOLUS
INTRAVENOUS | Status: DC | PRN
  Administered 2015-05-07: 250 mg via INTRAVENOUS

## 2015-05-07 MED ORDER — LACTATED RINGERS IR SOLN
Status: DC | PRN
  Administered 2015-05-07: 1000 mL

## 2015-05-07 MED ORDER — 0.9 % SODIUM CHLORIDE (POUR BTL) OPTIME
TOPICAL | Status: DC | PRN
  Administered 2015-05-07: 2000 mL

## 2015-05-07 MED ORDER — KCL IN DEXTROSE-NACL 20-5-0.9 MEQ/L-%-% IV SOLN
INTRAVENOUS | Status: DC
  Administered 2015-05-07 – 2015-05-10 (×5): via INTRAVENOUS
  Filled 2015-05-07 (×8): qty 1000

## 2015-05-07 MED ORDER — HYDROMORPHONE HCL 1 MG/ML IJ SOLN
0.2500 mg | INTRAMUSCULAR | Status: DC | PRN
  Administered 2015-05-07 (×4): 0.5 mg via INTRAVENOUS

## 2015-05-07 MED ORDER — EPHEDRINE SULFATE 50 MG/ML IJ SOLN
INTRAMUSCULAR | Status: DC | PRN
  Administered 2015-05-07: 5 mg via INTRAVENOUS

## 2015-05-07 MED ORDER — ONDANSETRON HCL 4 MG PO TABS
4.0000 mg | ORAL_TABLET | Freq: Four times a day (QID) | ORAL | Status: DC | PRN
Start: 2015-05-07 — End: 2015-05-11

## 2015-05-07 MED ORDER — FENTANYL CITRATE (PF) 250 MCG/5ML IJ SOLN
INTRAMUSCULAR | Status: AC
  Filled 2015-05-07: qty 25

## 2015-05-07 MED ORDER — BUPIVACAINE HCL (PF) 0.5 % IJ SOLN
INTRAMUSCULAR | Status: DC | PRN
  Administered 2015-05-07: 10 mL

## 2015-05-07 MED ORDER — ONDANSETRON HCL 4 MG/2ML IJ SOLN
4.0000 mg | INTRAMUSCULAR | Status: DC | PRN
  Filled 2015-05-07: qty 2

## 2015-05-07 MED ORDER — FENTANYL CITRATE (PF) 100 MCG/2ML IJ SOLN
INTRAMUSCULAR | Status: AC
  Filled 2015-05-07: qty 2

## 2015-05-07 MED ORDER — METOCLOPRAMIDE HCL 5 MG/ML IJ SOLN
INTRAMUSCULAR | Status: AC
  Filled 2015-05-07: qty 2

## 2015-05-07 MED ORDER — MIDAZOLAM HCL 10 MG/2ML IJ SOLN
INTRAMUSCULAR | Status: DC | PRN
  Administered 2015-05-07: 1 mg via INTRAVENOUS
  Administered 2015-05-07 (×2): 2 mg via INTRAVENOUS

## 2015-05-07 MED ORDER — ONDANSETRON HCL 4 MG/2ML IJ SOLN
4.0000 mg | Freq: Four times a day (QID) | INTRAMUSCULAR | Status: DC | PRN
  Administered 2015-05-08 – 2015-05-09 (×2): 4 mg via INTRAVENOUS
  Filled 2015-05-07: qty 2

## 2015-05-07 MED ORDER — DEXTROSE 5 % IV SOLN
2.0000 g | INTRAVENOUS | Status: AC
  Administered 2015-05-07: 2 g via INTRAVENOUS

## 2015-05-07 SURGICAL SUPPLY — 78 items
APPLIER CLIP 5 13 M/L LIGAMAX5 (MISCELLANEOUS) ×3
APPLIER CLIP ROT 10 11.4 M/L (STAPLE)
BLADE EXTENDED COATED 6.5IN (ELECTRODE) ×3 IMPLANT
CABLE HIGH FREQUENCY MONO STRZ (ELECTRODE) ×3 IMPLANT
CELLS DAT CNTRL 66122 CELL SVR (MISCELLANEOUS) IMPLANT
CHLORAPREP W/TINT 26ML (MISCELLANEOUS) ×3 IMPLANT
CLIP APPLIE 5 13 M/L LIGAMAX5 (MISCELLANEOUS) ×1 IMPLANT
CLIP APPLIE ROT 10 11.4 M/L (STAPLE) IMPLANT
COUNTER NEEDLE 20 DBL MAG RED (NEEDLE) ×3 IMPLANT
COVER MAYO STAND STRL (DRAPES) ×12 IMPLANT
COVER SURGICAL LIGHT HANDLE (MISCELLANEOUS) IMPLANT
DECANTER SPIKE VIAL GLASS SM (MISCELLANEOUS) ×3 IMPLANT
DISSECTOR BLUNT TIP ENDO 5MM (MISCELLANEOUS) IMPLANT
DRAIN CHANNEL 19F RND (DRAIN) IMPLANT
DRAPE CAMERA CLOSED 9X96 (DRAPES) IMPLANT
DRAPE LAPAROSCOPIC ABDOMINAL (DRAPES) ×3 IMPLANT
DRAPE LG THREE QUARTER DISP (DRAPES) IMPLANT
DRAPE SURG IRRIG POUCH 19X23 (DRAPES) IMPLANT
DRAPE UTILITY XL STRL (DRAPES) ×3 IMPLANT
DRSG OPSITE POSTOP 4X10 (GAUZE/BANDAGES/DRESSINGS) IMPLANT
DRSG OPSITE POSTOP 4X6 (GAUZE/BANDAGES/DRESSINGS) IMPLANT
DRSG OPSITE POSTOP 4X8 (GAUZE/BANDAGES/DRESSINGS) ×3 IMPLANT
DRSG TEGADERM 2-3/8X2-3/4 SM (GAUZE/BANDAGES/DRESSINGS) IMPLANT
ELECT PENCIL ROCKER SW 15FT (MISCELLANEOUS) IMPLANT
ELECT REM PT RETURN 15FT ADLT (MISCELLANEOUS) ×3 IMPLANT
EVACUATOR SILICONE 100CC (DRAIN) IMPLANT
FILTER SMOKE EVAC LAPAROSHD (FILTER) IMPLANT
GAUZE SPONGE 2X2 8PLY STRL LF (GAUZE/BANDAGES/DRESSINGS) ×1 IMPLANT
GAUZE SPONGE 4X4 12PLY STRL (GAUZE/BANDAGES/DRESSINGS) IMPLANT
GLOVE ECLIPSE 8.0 STRL XLNG CF (GLOVE) ×6 IMPLANT
GLOVE INDICATOR 8.0 STRL GRN (GLOVE) ×6 IMPLANT
GOWN STRL REUS W/TWL XL LVL3 (GOWN DISPOSABLE) ×18 IMPLANT
LEGGING LITHOTOMY PAIR STRL (DRAPES) ×3 IMPLANT
LIGASURE IMPACT 36 18CM CVD LR (INSTRUMENTS) ×6 IMPLANT
MANIFOLD NEPTUNE II (INSTRUMENTS) ×3 IMPLANT
PACK COLON (CUSTOM PROCEDURE TRAY) ×3 IMPLANT
PAD POSITIONING PINK XL (MISCELLANEOUS) IMPLANT
PORT LAP GEL ALEXIS MED 5-9CM (MISCELLANEOUS) IMPLANT
RELOAD PROXIMATE 75MM BLUE (ENDOMECHANICALS) ×9 IMPLANT
RTRCTR WOUND ALEXIS 18CM MED (MISCELLANEOUS)
SCISSORS LAP 5X35 DISP (ENDOMECHANICALS) ×3 IMPLANT
SET IRRIG TUBING LAPAROSCOPIC (IRRIGATION / IRRIGATOR) IMPLANT
SHEARS HARMONIC ACE PLUS 36CM (ENDOMECHANICALS) ×3 IMPLANT
SHEARS HARMONIC ACE PLUS 45CM (MISCELLANEOUS) IMPLANT
SLEEVE XCEL OPT CAN 5 100 (ENDOMECHANICALS) ×15 IMPLANT
SPONGE GAUZE 2X2 STER 10/PKG (GAUZE/BANDAGES/DRESSINGS) ×2
SPONGE LAP 18X18 X RAY DECT (DISPOSABLE) ×3 IMPLANT
STAPLER CUT CVD 40MM BLUE (STAPLE) ×3 IMPLANT
STAPLER CUT RELOAD BLUE (STAPLE) ×3 IMPLANT
STAPLER GUN LINEAR PROX 60 (STAPLE) ×3 IMPLANT
STAPLER PROXIMATE 75MM BLUE (STAPLE) ×3 IMPLANT
STAPLER VISISTAT 35W (STAPLE) ×3 IMPLANT
SUCTION POOLE TIP (SUCTIONS) IMPLANT
SUT ETHILON 3 0 PS 1 (SUTURE) IMPLANT
SUT PDS AB 1 CTX 36 (SUTURE) IMPLANT
SUT PDS AB 1 TP1 96 (SUTURE) ×6 IMPLANT
SUT PROLENE 2 0 KS (SUTURE) IMPLANT
SUT PROLENE 2 0 SH DA (SUTURE) ×3 IMPLANT
SUT SILK 2 0 (SUTURE) ×2
SUT SILK 2 0 SH CR/8 (SUTURE) ×3 IMPLANT
SUT SILK 2-0 18XBRD TIE 12 (SUTURE) ×1 IMPLANT
SUT SILK 3 0 (SUTURE) ×2
SUT SILK 3 0 SH CR/8 (SUTURE) ×6 IMPLANT
SUT SILK 3-0 18XBRD TIE 12 (SUTURE) ×1 IMPLANT
SUT VIC AB 3-0 SH 18 (SUTURE) ×6 IMPLANT
SUT VICRYL 2 0 18  UND BR (SUTURE)
SUT VICRYL 2 0 18 UND BR (SUTURE) IMPLANT
SYS LAPSCP GELPORT 120MM (MISCELLANEOUS) ×3
SYSTEM LAPSCP GELPORT 120MM (MISCELLANEOUS) ×1 IMPLANT
TAPE CLOTH SURG 4X10 WHT LF (GAUZE/BANDAGES/DRESSINGS) ×3 IMPLANT
TOWEL OR 17X26 10 PK STRL BLUE (TOWEL DISPOSABLE) IMPLANT
TOWEL OR NON WOVEN STRL DISP B (DISPOSABLE) ×3 IMPLANT
TRAY FOLEY W/METER SILVER 14FR (SET/KITS/TRAYS/PACK) IMPLANT
TRAY FOLEY W/METER SILVER 16FR (SET/KITS/TRAYS/PACK) ×3 IMPLANT
TROCAR BLADELESS OPT 5 100 (ENDOMECHANICALS) ×3 IMPLANT
TROCAR XCEL BLUNT TIP 100MML (ENDOMECHANICALS) IMPLANT
TROCAR XCEL NON-BLD 11X100MML (ENDOMECHANICALS) IMPLANT
TUBING FILTER THERMOFLATOR (ELECTROSURGICAL) ×3 IMPLANT

## 2015-05-07 NOTE — Progress Notes (Signed)
[name], male   DOB: [birth year], 54 y.o.   MRN: [id number]     [facility]      [address] [place], [place] [address]    Phone: [phone] FAX: [phone]     Subjective: Continued pain.  No flatus.  Little response with enemas. Afebrile.  Normal WBC.  H&h stable.    Objective:  Vital signs:  Filed Vitals:   05/06/15 1715 05/06/15 2146 05/07/15 0205 05/07/15 0510  BP: 154/83 138/80 140/87 144/85  Pulse: 65 77 73 76  Temp: 97.6 F (36.4 C) 98.1 F (36.7 C) 98.3 F (36.8 C) 97.9 F (36.6 C)  TempSrc: Oral Oral Oral Oral  Resp: 20 20 20 16   Height:      Weight:    136.986 kg (302 lb)  SpO2: 98% 98% 97% 93%    Last BM Date: 05/06/15  Intake/Output   Yesterday:  08/23 0701 - 08/24 0700 In: 4142 [I.V.:900; Blood:335] Out: -  This shift:    I/O last 3 completed shifts: In: 3953 [I.V.:900; Blood:335] Out: -     Physical Exam: General: Pt awake/alert/oriented x4 in no acute distress Abdomen: +BS, soft, distended.  Mild TTP, most appreciated to epigastrum/LUQ.      Problem List:   Principal Problem:   Abdominal pain, acute Active Problems:   Leukocytosis   Hypokalemia   Anemia of chronic disease   Pulmonary sarcoidosis    Results:   Labs: Results for orders placed or performed during the hospital encounter of 05/05/15 (from the past 48 hour(s))  Lipase, blood     Status: Abnormal   Collection Time: 05/05/15  1:28 PM  Result Value Ref Range   Lipase 12 (L) 22 - 51 U/L  Comprehensive metabolic panel     Status: Abnormal   Collection Time: 05/05/15  1:28 PM  Result Value Ref Range   Sodium 138 135 - 145 mmol/L   Potassium 3.3 (L) 3.5 - 5.1 mmol/L   Chloride 107 101 - 111 mmol/L   CO2 25 22 - 32 mmol/L   Glucose, Bld 112 (H) 65 - 99 mg/dL   BUN 10 6 - 20 mg/dL   Creatinine, Ser 0.85 0.61 - 1.24 mg/dL   Calcium 8.6 (L) 8.9 - 10.3 mg/dL   Total Protein 7.8 6.5 - 8.1 g/dL   Albumin  3.7 3.5 - 5.0 g/dL   AST 22 15 - 41 U/L   ALT 25 17 - 63 U/L   Alkaline Phosphatase 80 38 - 126 U/L   Total Bilirubin 0.6 0.3 - 1.2 mg/dL   GFR calc non Af Amer >60 >60 mL/min   GFR calc Af Amer >60 >60 mL/min    Comment: (NOTE) The eGFR has been calculated using the CKD EPI equation. This calculation has not been validated in all clinical situations. eGFR's persistently <60 mL/min signify possible Chronic Kidney Disease.    Anion gap 6 5 - 15  CBC     Status: Abnormal   Collection Time: 05/05/15  1:28 PM  Result Value Ref Range   WBC 11.3 (H) 4.0 - 10.5 K/uL   RBC 4.14 (L) 4.22 - 5.81 MIL/uL   Hemoglobin 8.6 (L) 13.0 - 17.0 g/dL   HCT 29.4 (L) 39.0 - 52.0 %   MCV 71.0 (L) 78.0 - 100.0 fL   MCH 20.8 (L) 26.0 - 34.0 pg   MCHC 29.3 (L) 30.0 - 36.0 g/dL   RDW  21.4 (H) 11.5 - 15.5 %   Platelets 522 (H) 150 - 400 K/uL  Lactic acid, plasma     Status: None   Collection Time: 05/05/15  3:13 PM  Result Value Ref Range   Lactic Acid, Venous 1.2 0.5 - 2.0 mmol/L  Urinalysis, Routine w reflex microscopic (not at [facility])     Status: Abnormal   Collection Time: 05/05/15  5:19 PM  Result Value Ref Range   Color, Urine YELLOW YELLOW   APPearance CLEAR CLEAR   Specific Gravity, Urine >1.046 (H) 1.005 - 1.030   pH 6.5 5.0 - 8.0   Glucose, UA NEGATIVE NEGATIVE mg/dL   Hgb urine dipstick NEGATIVE NEGATIVE   Bilirubin Urine NEGATIVE NEGATIVE   Ketones, ur 40 (A) NEGATIVE mg/dL   Protein, ur NEGATIVE NEGATIVE mg/dL   Urobilinogen, UA 0.2 0.0 - 1.0 mg/dL   Nitrite NEGATIVE NEGATIVE   Leukocytes, UA NEGATIVE NEGATIVE    Comment: MICROSCOPIC NOT DONE ON URINES WITH NEGATIVE PROTEIN, BLOOD, LEUKOCYTES, NITRITE, OR GLUCOSE <1000 mg/dL.  Basic metabolic panel     Status: Abnormal   Collection Time: 05/06/15  4:08 AM  Result Value Ref Range   Sodium 140 135 - 145 mmol/L   Potassium 3.6 3.5 - 5.1 mmol/L   Chloride 109 101 - 111 mmol/L   CO2 25 22 - 32 mmol/L   Glucose, Bld 89 65 - 99 mg/dL    BUN 10 6 - 20 mg/dL   Creatinine, Ser 0.85 0.61 - 1.24 mg/dL   Calcium 8.4 (L) 8.9 - 10.3 mg/dL   GFR calc non Af Amer >60 >60 mL/min   GFR calc Af Amer >60 >60 mL/min    Comment: (NOTE) The eGFR has been calculated using the CKD EPI equation. This calculation has not been validated in all clinical situations. eGFR's persistently <60 mL/min signify possible Chronic Kidney Disease.    Anion gap 6 5 - 15  CBC     Status: Abnormal   Collection Time: 05/06/15  4:08 AM  Result Value Ref Range   WBC 10.1 4.0 - 10.5 K/uL   RBC 3.77 (L) 4.22 - 5.81 MIL/uL   Hemoglobin 7.6 (L) 13.0 - 17.0 g/dL   HCT 27.3 (L) 39.0 - 52.0 %   MCV 72.4 (L) 78.0 - 100.0 fL   MCH 20.2 (L) 26.0 - 34.0 pg   MCHC 27.8 (L) 30.0 - 36.0 g/dL   RDW 21.3 (H) 11.5 - 15.5 %   Platelets 478 (H) 150 - 400 K/uL  CEA     Status: None   Collection Time: 05/06/15  9:26 AM  Result Value Ref Range   CEA 1.5 0.0 - 4.7 ng/mL    Comment: (NOTE)       Roche ECLIA methodology       Nonsmokers  <3.9                                     Smokers     <5.6 Performed At: [facility] [place], [place] [id number] Lindon Romp MD JG:[id number]   Cancer antigen 19-9     Status: None   Collection Time: 05/06/15  9:26 AM  Result Value Ref Range   CA 19-9 <1 0 - 35 U/mL    Comment: (NOTE) Roche ECLIA methodology Performed At: [facility] [place], [place] [id number] Lindon Romp MD FK:[id number]  Prepare RBC     Status: None   Collection Time: 05/06/15 11:30 AM  Result Value Ref Range   Order Confirmation ORDER PROCESSED BY BLOOD BANK   Type and screen     Status: None (Preliminary result)   Collection Time: 05/06/15 12:10 PM  Result Value Ref Range   ABO/RH(D) O POS    Antibody Screen NEG    Sample Expiration 05/09/2015    Unit Number [id number]    Blood Component Type RED CELLS,LR    Unit division 00    Status of Unit ISSUED    Transfusion Status OK TO TRANSFUSE     Crossmatch Result Compatible   ABO/Rh     Status: None   Collection Time: 05/06/15 12:10 PM  Result Value Ref Range   ABO/RH(D) O POS   CBC     Status: Abnormal   Collection Time: 05/07/15  4:02 AM  Result Value Ref Range   WBC 9.3 4.0 - 10.5 K/uL   RBC 4.10 (L) 4.22 - 5.81 MIL/uL   Hemoglobin 8.8 (L) 13.0 - 17.0 g/dL   HCT 30.4 (L) 39.0 - 52.0 %   MCV 74.1 (L) 78.0 - 100.0 fL   MCH 21.5 (L) 26.0 - 34.0 pg   MCHC 28.9 (L) 30.0 - 36.0 g/dL   RDW 21.1 (H) 11.5 - 15.5 %   Platelets 453 (H) 150 - 400 K/uL  Basic metabolic panel     Status: Abnormal   Collection Time: 05/07/15  4:02 AM  Result Value Ref Range   Sodium 139 135 - 145 mmol/L   Potassium 3.6 3.5 - 5.1 mmol/L   Chloride 108 101 - 111 mmol/L   CO2 22 22 - 32 mmol/L   Glucose, Bld 81 65 - 99 mg/dL   BUN 9 6 - 20 mg/dL   Creatinine, Ser 0.86 0.61 - 1.24 mg/dL   Calcium 8.1 (L) 8.9 - 10.3 mg/dL   GFR calc non Af Amer >60 >60 mL/min   GFR calc Af Amer >60 >60 mL/min    Comment: (NOTE) The eGFR has been calculated using the CKD EPI equation. This calculation has not been validated in all clinical situations. eGFR's persistently <60 mL/min signify possible Chronic Kidney Disease.    Anion gap 9 5 - 15    Imaging / Studies: Ct Abdomen Pelvis W Contrast  05/05/2015   CLINICAL DATA:  Acute generalized abdominal pain.  EXAM: CT ABDOMEN AND PELVIS WITH CONTRAST  TECHNIQUE: Multidetector CT imaging of the abdomen and pelvis was performed using the standard protocol following bolus administration of intravenous contrast.  CONTRAST:  176m OMNIPAQUE IOHEXOL 300 MG/ML  SOLN  COMPARISON:  None.  FINDINGS: Visualized lung bases appear normal. No significant osseous abnormality is noted.  No gallstones are noted. The liver, spleen and pancreas appear normal. Adrenal glands appear normal. Bilateral nonobstructive nephrolithiasis is noted. No hydronephrosis or renal obstruction is noted. No ureteral calculi are noted. There appears to be  moderate to severe dilatation of the right colon, transverse colon and proximal descending colon with persistent narrowing seen in the distal portion of the descending colon. There appears to be a soft tissue mass in this area concerning for possible neoplasm. This measures 3.4 x 3.1 cm. It appears to extend to an adjacent loop of small bowel, but does not appear to cause small bowel obstruction. Urinary bladder appears normal. No significant adenopathy is noted. Mass is noted along the anterior portion the peritoneal measuring 29 x 11 mm  concerning for carcinomatosis, best seen on image number 61 of series 2.  IMPRESSION: Bilateral nonobstructive nephrolithiasis.  Obstruction of the ascending, transverse and proximal descending colon is noted secondary to probable mass in distal portion of descending colon. This soft tissue abnormality appears to extend to adjacent loop of small bowel. 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 Colonoscopy is recommended to evaluate for possible colonic malignancy.   Electronically Signed   By: Marijo Conception, M.D.   On: 05/05/2015 16:14    Medications / Allergies:  Scheduled Meds: . enoxaparin (LOVENOX) injection  40 mg Subcutaneous Q24H  . zolpidem  10 mg Oral QHS   Continuous Infusions: . sodium chloride 75 mL/hr at 05/06/15 1800   PRN Meds:.HYDROmorphone (DILAUDID) injection, ondansetron **OR** ondansetron (ZOFRAN) IV  Antibiotics: Anti-infectives    None        Assessment/Plan Obstructing distal colon lesion involving small bowel-flex sig today, need to determine whether its intrinsic or extrinsic lesion.  Likely surgery tomorrow/friday pending results. Pulmonary Sarcoidosis -add incentive spirometry, pulmonary following.  Anemia-h&h stable  Erby Pian, [facility] Pager [phone]([id number]:30P)   05/07/2015 7:46 AM

## 2015-05-07 NOTE — Progress Notes (Signed)
Progress Note   [name]:[id number] DOB: 1961-01-05 DOA: 05/05/2015 PCP: [name], MD   Brief Narrative:   [name] is an 54 y.o. male the PMH of diverticulitis with perforation/abscess, hypertension, obesity, and sarcoid who was admitted 05/05/15 with LUQ pain. CT of the abdomen showed moderate-severe dilatation of the right colon, transverse colon and proximal descending colon persistent narrowing in the distal portion of the descending colon concerning for possible neoplasm. Surgery and GI subsequently consulted.  Assessment/Plan:   Principal Problem:   Acute abdominal pain with colonic mass, rule out neoplasm - GI and surgery consulted on admission after CT scan showed findings consistent with a colonic mass. - Sigmoidoscopy done 05/07/15: Obstructing mass found.  For surgical resection today.  Active Problems:   Hypokalemia secondary to nausea and vomiting - Supplemented.    Recent diverticulitis large intestine - Has completed multiple courses of antibiotics.    Morbid obesity with probable obesity hypoventilation syndrome - Body mass index is 41.16 kg/(m^2). - Denies a history of OSA after being tested in 2009.    Leukocytosis - Mild, likely reflective of hemoconcentration. Normalized with hydration.    Microcytic anemia - Concerning for chronic blood loss given colonic mass. Check iron studies.    Pulmonary sarcoidosis - Seen by pulmonologist for preoperative clearance. Pulmonary risk felt to be minimal. - Most recent PFTs 1/ 2015 showed a normal diffusion capacity and only moderate reduction in FVC.  - Most recent CT chest in 2015 showed no evidence of pulmonary parenchymal abnormality.  - He does not use oxygen and he has had no respiratory complaints lately.    DVT Prophylaxis - Lovenox ordered.  Family Communication: Dawn, sister, updated at the bedside. Disposition Plan: Home when stable. Code Status:     Code Status Orders        Start     Ordered   05/05/15 1815  Full code   Continuous     05/05/15 1814        IV Access:    Peripheral IV   Procedures and diagnostic studies:   Ct Abdomen Pelvis W Contrast  05/05/2015   CLINICAL DATA:  Acute generalized abdominal pain.  EXAM: CT ABDOMEN AND PELVIS WITH CONTRAST  TECHNIQUE: Multidetector CT imaging of the abdomen and pelvis was performed using the standard protocol following bolus administration of intravenous contrast.  CONTRAST:  135mL OMNIPAQUE IOHEXOL 300 MG/ML  SOLN  COMPARISON:  None.  FINDINGS: Visualized lung bases appear normal. No significant osseous abnormality is noted.  No gallstones are noted. The liver, spleen and pancreas appear normal. Adrenal glands appear normal. Bilateral nonobstructive nephrolithiasis is noted. No hydronephrosis or renal obstruction is noted. No ureteral calculi are noted. There appears to be moderate to severe dilatation of the right colon, transverse colon and proximal descending colon with persistent narrowing seen in the distal portion of the descending colon. There appears to be a soft tissue mass in this area concerning for possible neoplasm. This measures 3.4 x 3.1 cm. It appears to extend to an adjacent loop of small bowel, but does not appear to cause small bowel obstruction. Urinary bladder appears normal. No significant adenopathy is noted. Mass is noted along the anterior portion the peritoneal measuring 29 x 11 mm concerning for carcinomatosis, best seen on image number 61 of series 2.  IMPRESSION: Bilateral nonobstructive nephrolithiasis.  Obstruction of the ascending, transverse and proximal descending colon is noted secondary to probable mass in distal portion  of descending colon. This soft tissue abnormality appears to extend to adjacent loop of small bowel. 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 Colonoscopy is recommended to evaluate for  possible colonic malignancy.   Electronically Signed   By: Marijo Conception, M.D.   On: 05/05/2015 16:14     Medical Consultants:    Pulmonology: Juanito Doom, MD  Gastroenterology: Laurence Spates, MD  Surgery: [name], MD [name], MD   Anti-Infectives:    None.  Subjective:   [name] has not had any vomiting today, but remains nauseated.  Had enemas yesterday, so he moved his bowels then.  Describes lower abdominal pain, 8-10/10 and eases off with pain meds.   Objective:    Filed Vitals:   05/06/15 1715 05/06/15 2146 05/07/15 0205 05/07/15 0510  BP: 154/83 138/80 140/87 144/85  Pulse: 65 77 73 76  Temp: 97.6 F (36.4 C) 98.1 F (36.7 C) 98.3 F (36.8 C) 97.9 F (36.6 C)  TempSrc: Oral Oral Oral Oral  Resp: 20 20 20 16   Height:      Weight:    136.986 kg (302 lb)  SpO2: 98% 98% 97% 93%    Intake/Output Summary (Last 24 hours) at 05/07/15 0759 Last data filed at 05/06/15 1800  Gross per 24 hour  Intake   1235 ml  Output      0 ml  Net   1235 ml    Exam: Gen:  NAD, obese Cardiovascular:  RRR, No M/R/G Respiratory:  Lungs CTAB Gastrointestinal:  Abdomen soft, mildly tender lower abdomen, + BS Extremities:  No C/E/C   Data Reviewed:    Labs: Basic Metabolic Panel:  Recent Labs Lab 05/05/15 1328 05/06/15 0408 05/07/15 0402  NA 138 140 139  K 3.3* 3.6 3.6  CL 107 109 108  CO2 25 25 22   GLUCOSE 112* 89 81  BUN 10 10 9   CREATININE 0.85 0.85 0.86  CALCIUM 8.6* 8.4* 8.1*   GFR Estimated Creatinine Clearance: 137.3 mL/min (by C-G formula based on Cr of 0.86). Liver Function Tests:  Recent Labs Lab 05/05/15 1328  AST 22  ALT 25  ALKPHOS 80  BILITOT 0.6  PROT 7.8  ALBUMIN 3.7    Recent Labs Lab 05/05/15 1328  LIPASE 12*   CBC:  Recent Labs Lab 05/05/15 1328 05/06/15 0408 05/07/15 0402  WBC 11.3* 10.1 9.3  HGB 8.6* 7.6* 8.8*  HCT 29.4* 27.3* 30.4*  MCV 71.0* 72.4* 74.1*  PLT 522* 478* 453*    Sepsis Labs:  Recent Labs Lab 05/05/15 1328 05/05/15 1513 05/06/15 0408 05/07/15 0402  WBC 11.3*  --  10.1 9.3  LATICACIDVEN  --  1.2  --   --    Microbiology No results found for this or any previous visit (from the past 240 hour(s)).   Medications:   . enoxaparin (LOVENOX) injection  40 mg Subcutaneous Q24H  . zolpidem  10 mg Oral QHS   Continuous Infusions: . sodium chloride 75 mL/hr at 05/06/15 1800    Time spent: 35 minutes with > 50% of time discussing current diagnostic test results, clinical impression and plan of care.    LOS: 2 days   Bri Wakeman  [facility] Pager [phone]. If unable to reach me by pager, please call my cell phone at [phone].  *Please refer to [email or web address], password [email or web address] to get updated schedule on who will round on this patient, as hospitalists switch teams weekly.  If 7PM-7AM, please contact night-coverage at [email or web address], password [email or web address] for any overnight needs.  05/07/2015, 7:59 AM

## 2015-05-07 NOTE — Anesthesia Procedure Notes (Signed)
Procedure Name: Intubation Date/Time: 05/07/2015 12:45 PM Performed by: Carleene Cooper A Pre-anesthesia Checklist: Patient identified, Emergency Drugs available, Patient being monitored, Timeout performed and Suction available Patient Re-evaluated:Patient Re-evaluated prior to inductionOxygen Delivery Method: Circle system utilized Preoxygenation: Pre-oxygenation with 100% oxygen Intubation Type: IV induction, Rapid sequence and Cricoid Pressure applied Laryngoscope Size: Mac and 4 Grade View: Grade I Tube type: Oral Tube size: 7.5 mm Number of attempts: 1 (RSI with cricoid pressure by Dr. Landry Dyke. ATOI.) Airway Equipment and Method: Stylet Placement Confirmation: ETT inserted through vocal cords under direct vision,  positive ETCO2 and breath sounds checked- equal and bilateral Secured at: 21 cm Tube secured with: Tape Dental Injury: Teeth and Oropharynx as per pre-operative assessment

## 2015-05-07 NOTE — Transfer of Care (Signed)
Immediate Anesthesia Transfer of Care Note  Patient: [name]  Procedure(s) Performed: Procedure(s): LAPAROSCOPIC ASSITED PARTIAL COLECTOMY WITH COLOSTOMY, SMALL BOWEL RESECTION, EXCISION OF PERITONEAL NODULE (N/A)  Patient Location: PACU  Anesthesia Type:General  Level of Consciousness:  sedated, patient cooperative and responds to stimulation  Airway & Oxygen Therapy:Patient Spontanous Breathing and Patient connected to face mask oxgen  Post-op Assessment:  Report given to PACU RN and Post -op Vital signs reviewed and stable  Post vital signs:  Reviewed and stable  Last Vitals:  Filed Vitals:   05/07/15 1715  BP: 144/68  Pulse: 84  Temp:   Resp: 10    Complications: No apparent anesthesia complications

## 2015-05-07 NOTE — Consult Note (Signed)
WOC ostomy consult note Patient seen for preoperative stoma site selection per request of Dr. Zella Richer.  Patient is uncomfortable, but is able to sit on the side of the bed briefly for me to mark. 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  I am unable to palpate an abdominal rectus muscle and cannot see one when the patient is lying down and raises his head. Two sites are provided: RUQ site is 8.5cm to the right of the umbilicus and 3cm above.    LUQ site is 9cm to the left of the umbilicus and 3.9QZ above.  Sites are marked with a surgical marking pen and covered with a thin film transparent dressing.  Patient is aware that he may have a colostomy created intraoperatively and that if so, he would be working with a nurse specially trained for directing the care of the patient with a stoma. Sister is with patient this morning prior to surgery. [place] nursing team will not follow, but will remain available to this patient, the nursing, surgical and medical teams.  Please re-consult if an ostomy is created intraoperatively.. Thanks, Maudie Flakes, MSN, RN, [place], [place], [place] [phone])

## 2015-05-07 NOTE — H&P (View-Only) (Signed)
[facility] CONS[facility] Reason for consult: obstructedcolon Referring Physician: [facility]. PCP: Dr. Kary Kos. Primary G.I.: Dr. Harlin Rain [name] is an 54 y.o. male.  HPI: the patient self referred to Dr. Penelope Coop recently for follow-up diverticulitis. He was 1st diagnosed in March this year when he was admitted [facility]. He was treated with antibiotics and sent home with oral antibiotics. CT scan from 3/16 showed thickening of the colonic wall in the mid-descending colon with diverticuli with pericolonic  stranding consistent with diverticulitis. Apparently there was a question of micro perforation and this was while he was given antibiotics. He saw surgeons, gastroenterologist in [place] and a colonoscopy was plan but was delayed due to concerns about perforating: in the case of active diverticulitis. And he was continued on antibiotics. He became sicker and never really felt that he was getting well. He has had several CT scans which apparently showed continued diverticulitis. He saw Dr. Penelope Coop in the office because he and his wife felt that he was not getting better. The time that he saw Dr. Penelope Coop a couple weeks ago his abdominal exam is not that remarkable. Elective outpatient colonoscopy was arranged. It was scheduled for next week. The patient called and that he was becoming more distended and having worsening pain and he was told to come to the emergency room where repeat CT scan showed a colonic mass in the descending colon possibly involving the adjacent loop of small bowel with markedly dilated proximal colon. There was also what was felt to be a peritoneal implant. The patient has had very little bowel movement or flatus but lots of burping and distention. Patient has been seen by surgery. Concern is that this could be a tumor rather than diverticulitis. He also has a history of sarcoid and not really sure exactly what this means.  Past Medical History  Diagnosis Date  .  Hypertension   . Sarcoid   . Neuropathy   . Obstructive apnea 04/26/2013  . Diverticulitis large intestine 01/13/2015  . Diverticulitis of colon 02/19/2015  . 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 Extreme obesity 12/21/2012  . LBP (low back pain) 05/28/2014  . H/O disease 03/21/2015  . Adiposity 03/21/2015    History reviewed. No pertinent past surgical history.  Family History  Problem Relation Age of Onset  . Arthritis Mother   . Heart disease Mother   . Stroke Father     Social History:  reports that he has never smoked. He has never used smokeless tobacco. He reports that he does not drink alcohol or use illicit drugs.  Allergies:  Allergies  Allergen Reactions  . Fentanyl Nausea And Vomiting  . Versed [Midazolam] Nausea And Vomiting  . Gabapentin     Swallowing problems  . Paba Derivatives Nausea And Vomiting    Pt states he is allergic to unknown anesthesia. Pt has nausea and vomiting with anesthesia.     Medications; Prior to Admission medications   Medication Sig Start Date End Date Taking? Authorizing Provider  AMBIEN 10 MG tablet Take 10 mg by mouth at bedtime as needed for sleep.  02/16/15  Yes Historical Provider, MD  ciprofloxacin (CIPRO) 500 MG tablet Take 1 tablet by mouth 2 (two) times daily. For 14 days, starting 04/22/15.  (2nd course. Initial course started on 04/08/15.) 04/22/15  Yes Historical Provider, MD  metroNIDAZOLE (FLAGYL) 500 MG tablet Take 1 tablet by mouth 2 (two) times daily. For 14 days, starting  04/22/2015. 04/22/15  Yes Historical Provider, MD  pantoprazole (PROTONIX) 40 MG tablet Take 1 tablet (40 mg total) by mouth daily. Patient not taking: Reported on 05/05/2015 02/19/15   Marlyce Huge, MD   . enoxaparin (LOVENOX) injection  40 mg Subcutaneous Q24H  . sodium phosphate  1 enema Rectal Q4H  . zolpidem  10 mg Oral QHS   PRN Meds HYDROmorphone (DILAUDID) injection, ondansetron **OR** ondansetron  (ZOFRAN) IV Results for orders placed or performed during the hospital encounter of 05/05/15 (from the past 48 hour(s))  Lipase, blood     Status: Abnormal   Collection Time: 05/05/15  1:28 PM  Result Value Ref Range   Lipase 12 (L) 22 - 51 U/L  Comprehensive metabolic panel     Status: Abnormal   Collection Time: 05/05/15  1:28 PM  Result Value Ref Range   Sodium 138 135 - 145 mmol/L   Potassium 3.3 (L) 3.5 - 5.1 mmol/L   Chloride 107 101 - 111 mmol/L   CO2 25 22 - 32 mmol/L   Glucose, Bld 112 (H) 65 - 99 mg/dL   BUN 10 6 - 20 mg/dL   Creatinine, Ser 0.85 0.61 - 1.24 mg/dL   Calcium 8.6 (L) 8.9 - 10.3 mg/dL   Total Protein 7.8 6.5 - 8.1 g/dL   Albumin 3.7 3.5 - 5.0 g/dL   AST 22 15 - 41 U/L   ALT 25 17 - 63 U/L   Alkaline Phosphatase 80 38 - 126 U/L   Total Bilirubin 0.6 0.3 - 1.2 mg/dL   GFR calc non Af Amer >60 >60 mL/min   GFR calc Af Amer >60 >60 mL/min    Comment: (NOTE) The eGFR has been calculated using the CKD EPI equation. This calculation has not been validated in all clinical situations. eGFR's persistently <60 mL/min signify possible Chronic Kidney Disease.    Anion gap 6 5 - 15  CBC     Status: Abnormal   Collection Time: 05/05/15  1:28 PM  Result Value Ref Range   WBC 11.3 (H) 4.0 - 10.5 K/uL   RBC 4.14 (L) 4.22 - 5.81 MIL/uL   Hemoglobin 8.6 (L) 13.0 - 17.0 g/dL   HCT 29.4 (L) 39.0 - 52.0 %   MCV 71.0 (L) 78.0 - 100.0 fL   MCH 20.8 (L) 26.0 - 34.0 pg   MCHC 29.3 (L) 30.0 - 36.0 g/dL   RDW 21.4 (H) 11.5 - 15.5 %   Platelets 522 (H) 150 - 400 K/uL  Lactic acid, plasma     Status: None   Collection Time: 05/05/15  3:13 PM  Result Value Ref Range   Lactic Acid, Venous 1.2 0.5 - 2.0 mmol/L  Urinalysis, Routine w reflex microscopic (not at [facility])     Status: Abnormal   Collection Time: 05/05/15  5:19 PM  Result Value Ref Range   Color, Urine YELLOW YELLOW   APPearance CLEAR CLEAR   Specific Gravity, Urine >1.046 (H) 1.005 - 1.030   pH 6.5 5.0 - 8.0    Glucose, UA NEGATIVE NEGATIVE mg/dL   Hgb urine dipstick NEGATIVE NEGATIVE   Bilirubin Urine NEGATIVE NEGATIVE   Ketones, ur 40 (A) NEGATIVE mg/dL   Protein, ur NEGATIVE NEGATIVE mg/dL   Urobilinogen, UA 0.2 0.0 - 1.0 mg/dL   Nitrite NEGATIVE NEGATIVE   Leukocytes, UA NEGATIVE NEGATIVE    Comment: MICROSCOPIC NOT DONE ON URINES WITH NEGATIVE PROTEIN, BLOOD, LEUKOCYTES, NITRITE, OR GLUCOSE <1000 mg/dL.  Basic metabolic panel  Status: Abnormal   Collection Time: 05/06/15  4:08 AM  Result Value Ref Range   Sodium 140 135 - 145 mmol/L   Potassium 3.6 3.5 - 5.1 mmol/L   Chloride 109 101 - 111 mmol/L   CO2 25 22 - 32 mmol/L   Glucose, Bld 89 65 - 99 mg/dL   BUN 10 6 - 20 mg/dL   Creatinine, Ser 0.85 0.61 - 1.24 mg/dL   Calcium 8.4 (L) 8.9 - 10.3 mg/dL   GFR calc non Af Amer >60 >60 mL/min   GFR calc Af Amer >60 >60 mL/min    Comment: (NOTE) The eGFR has been calculated using the CKD EPI equation. This calculation has not been validated in all clinical situations. eGFR's persistently <60 mL/min signify possible Chronic Kidney Disease.    Anion gap 6 5 - 15  CBC     Status: Abnormal   Collection Time: 05/06/15  4:08 AM  Result Value Ref Range   WBC 10.1 4.0 - 10.5 K/uL   RBC 3.77 (L) 4.22 - 5.81 MIL/uL   Hemoglobin 7.6 (L) 13.0 - 17.0 g/dL   HCT 27.3 (L) 39.0 - 52.0 %   MCV 72.4 (L) 78.0 - 100.0 fL   MCH 20.2 (L) 26.0 - 34.0 pg   MCHC 27.8 (L) 30.0 - 36.0 g/dL   RDW 21.3 (H) 11.5 - 15.5 %   Platelets 478 (H) 150 - 400 K/uL    Ct Abdomen Pelvis W Contrast  05/05/2015   CLINICAL DATA:  Acute generalized abdominal pain.  EXAM: CT ABDOMEN AND PELVIS WITH CONTRAST  TECHNIQUE: Multidetector CT imaging of the abdomen and pelvis was performed using the standard protocol following bolus administration of intravenous contrast.  CONTRAST:  177m OMNIPAQUE IOHEXOL 300 MG/ML  SOLN  COMPARISON:  None.  FINDINGS: Visualized lung bases appear normal. No significant osseous abnormality is  noted.  No gallstones are noted. The liver, spleen and pancreas appear normal. Adrenal glands appear normal. Bilateral nonobstructive nephrolithiasis is noted. No hydronephrosis or renal obstruction is noted. No ureteral calculi are noted. There appears to be moderate to severe dilatation of the right colon, transverse colon and proximal descending colon with persistent narrowing seen in the distal portion of the descending colon. There appears to be a soft tissue mass in this area concerning for possible neoplasm. This measures 3.4 x 3.1 cm. It appears to extend to an adjacent loop of small bowel, but does not appear to cause small bowel obstruction. Urinary bladder appears normal. No significant adenopathy is noted. Mass is noted along the anterior portion the peritoneal measuring 29 x 11 mm concerning for carcinomatosis, best seen on image number 61 of series 2.  IMPRESSION: Bilateral nonobstructive nephrolithiasis.  Obstruction of the ascending, transverse and proximal descending colon is noted secondary to probable mass in distal portion of descending colon. This soft tissue abnormality appears to extend to adjacent loop of small bowel. 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 Colonoscopy is recommended to evaluate for possible colonic malignancy.   Electronically Signed   By: JMarijo Conception M.D.   On: 05/05/2015 16:14              Blood pressure 141/78, pulse 64, temperature 97.5 F (36.4 C), temperature source Oral, resp. rate 20, height 5' 11"  (1.803 m), weight 136.986 kg (302 lb), SpO2 98 %.  Physical exam:   General-- alert oriented white male, obese ENT-- nonicteric Neck-- obese neck  Heart-- regular rate and rhythm without murmurs or gallops Lungs-- grossly clear Abdomen-- obese and slightly distended with good bowel sounds. There is some upper abdominal tenderness.  Assessment: 1. Colonic obstruction.  There is mass effect at the descending colon that may well be a colonic tumor rather than diverticulitis. It's possible this could involve adjacent small bowel. I agree we need to evaluate this endoscopically. I think he clearly will need surgery. He has been treated for diverticulitis for several months now and has gotten progressively worse in spite of appropriate treatment for diverticulitis.  Plan: 1. Agree with keeping him NPO now. 2. Aggressive enema therapy and will plan on sigmoidoscopy tomorrow at 830. Have discussed this with the patient and he is agreeable.   Hardeep Reetz JR,Stesha Neyens L 05/06/2015, 9:42 AM   Pager: [phone] If no answer or after hours call [phone]

## 2015-05-07 NOTE — Op Note (Signed)
[facility] [place], [id number]   FLEXIBLE SIGMOIDOSCOPY PROCEDURE REPORT  PATIENT: [name], [name]  MR#: [id number] BIRTHDATE: February 15, 1961 , 83  yrs. old GENDER: male ENDOSCOPIST: Laurence Spates, MD REFERRED BY: [facility]. PROCEDURE DATE:  05/07/2015 PROCEDURE: ASA CLASS:   class II INDICATIONS:obstructed: on CT scan MEDICATIONS: fentanyl 100 g, versed 5 mg,  Zofran 4 mg IV  DESCRIPTION OF PROCEDURE:   After the risks benefits and alternatives of the procedure were thoroughly explained, informed consent was obtained.  Digital exam revealed no abnormalities of the rectum. The pediatric colonoscope       endoscope was introduced through the anus  and advanced to the level of obstruction. This was approximately 50 cm from the anal verge and felt to be at the junction of the descending and sigmoid colon. The patient has been prepped with tap water enemas and the exam was quite clean until that point. And obstructing mass was encountered which completely filled the lumen and prevented passage of the scope. It was hard friable and biopsies were pain. It was impossible to pass through the mass.      , The exam was Without limitations.    The quality of the prep was quite good up until the level of obstruction      . Estimated blood loss is zero unless otherwise noted in this procedure report. The instrument was then slowly withdrawn as the mucosa was fully examined.. The patient tolerated the procedure well.       The scope was then withdrawn from the patient and the procedure terminated.  COMPLICATIONS: There were no immediate complications.  ENDOSCOPIC IMPRESSION: 1. Obstructing Mass. Appears to be intrinsic and likely is a tumor located at the junction of the descending and sigmoid colon. This is completely obstructing and prevents passage of the scope.  RECOMMENDATIONS: will keep the patient NPO. He will need resection of this in  the very near future.  REPEAT EXAM:  eSigned:  Laurence Spates, MD 05/07/2015 9:07 AM   CC:Dr. [name] MD, [name] M.D.

## 2015-05-07 NOTE — Care Management Note (Signed)
Case Management Note  Patient Details  Name: [name] MRN: [id number] Date of Birth: [birth year]  Subjective/Objective:       54 yo admitted with abdominal pain             Action/Plan: From home with spouse  Expected Discharge Date:                  Expected Discharge Plan:  Home/Self Care  In-House Referral:     Discharge planning Services  CM Consult  Post Acute Care Choice:    Choice offered to:     DME Arranged:    DME Agency:     HH Arranged:    HH Agency:     Status of Service:  In process, will continue to follow  Medicare Important Message Given:    Date Medicare IM Given:    Medicare IM give by:    Date Additional Medicare IM Given:    Additional Medicare Important Message give by:     If discussed at [place] of Stay Meetings, dates discussed:    Additional Comments: Chart reviewed and CM following for DC needs. Lynnell Catalan, RN 05/07/2015, 3:13 PM

## 2015-05-07 NOTE — Anesthesia Preprocedure Evaluation (Addendum)
Anesthesia Evaluation  Patient identified by MRN, date of birth, ID band Patient awake    Reviewed: Allergy & Precautions, H&P , NPO status , Patient's Chart, lab work & pertinent test results  History of Anesthesia Complications (+) PONV  Airway Mallampati: III  TM Distance: >3 FB Neck ROM: full    Dental  (+) Dental Advisory Given, Poor Dentition, Chipped, Missing Missing and broken front lateral teeth upper.  All teeth in bad shape:   Pulmonary sleep apnea ,  sarcoid breath sounds clear to auscultation  Pulmonary exam normal       Cardiovascular Exercise Tolerance: Good hypertension, negative cardio ROS Normal cardiovascular examRhythm:regular Rate:Normal  PACs   Neuro/Psych  Neuromuscular disease negative psych ROS   GI/Hepatic negative GI ROS, Neg liver ROS,   Endo/Other  Morbid obesity  Renal/GU negative Renal ROS  negative genitourinary   Musculoskeletal   Abdominal   Peds  Hematology negative hematology ROS (+) anemia , hgb 8.8   Anesthesia Other Findings   Reproductive/Obstetrics negative OB ROS                         Anesthesia Physical Anesthesia Plan  ASA: III  Anesthesia Plan: General   Post-op Pain Management:    Induction: Intravenous, Rapid sequence and Cricoid pressure planned  Airway Management Planned: Oral ETT  Additional Equipment:   Intra-op Plan:   Post-operative Plan: Extubation in OR  Informed Consent: I have reviewed the patients History and Physical, chart, labs and discussed the procedure including the risks, benefits and alternatives for the proposed anesthesia with the patient or authorized representative who has indicated his/her understanding and acceptance.   Dental Advisory Given  Plan Discussed with: CRNA and Surgeon  Anesthesia Plan Comments:         Anesthesia Quick Evaluation

## 2015-05-07 NOTE — Interval H&P Note (Signed)
History and Physical Interval Note:  05/07/2015 8:16 AM  [name]  has presented today for surgery, with the diagnosis of obstructed colon  The various methods of treatment have been discussed with the patient and family. After consideration of risks, benefits and other options for treatment, the patient has consented to  Procedure(s): FLEXIBLE SIGMOIDOSCOPY (N/A) as a surgical intervention .  The patient's history has been reviewed, patient examined, no change in status, stable for surgery.  I have reviewed the patient's chart and labs.  Questions were answered to the patient's satisfaction.    Pt states that he had N+V with meds for cardiac cath but no SOB, flushing etc will give some zofran.   [name],[name]

## 2015-05-07 NOTE — Op Note (Signed)
Operative Note  [name] male 54 y.o. 05/07/2015  PREOPERATIVE DX:  Obstructing descending colon tumor  POSTOPERATIVE DX:  Same with metastatic adenocarcinoma peritoneal implant  PROCEDURE:   Laparoscopic hand-assisted partial colectomy, small bowel resection, excision of peritoneal implant from anterior abdominal wall, colostomy.         Surgeon: Odis Hollingshead   Assistants: Erby Pian, NP  Anesthesia: General endotracheal anesthesia  Indications:   This is a 54 year old male admitted with a large bowel obstruction due to a tumor of the descending colon. Colonoscopy demonstrated a completely obstructing intraluminal lesion. CT scan suggested a peritoneal nodule and small bowel involvement. He is now brought to the operating room.    Procedure Detail:  He was brought to the operating room placed supine on the operating table in the general anesthetic was given. A nasogastric tube was inserted. A Foley catheter was inserted. The hair on the abdominal wall was clipped. The abdominal wall was widely sterilely prepped and draped.  A small incision was made above the umbilicus through the skin and subcutaneous tissue. An incision was made in the fascia and peritoneum. A pursestring suture of 0 Vicryl was placed around the edges of the fascia. A Hassan trocar was introduced into the peritoneal cavity and pneumoperitoneum was graded by insufflation of CO2 gas. The laparoscope was introduced and there is no evidence of underlying organ injury or bleeding but there were adhesions of the omentum to the lower abdominal wall and left lateral abdominal wall. A 5 mm trocar was placed in the right upper quadrant. A 5 mm trocar was placed in the right lower quadrant. Using sharp dissections, I lysed the adhesions to the anterior abdominal wall. I noticed a firm nodule on the anterior peritoneum lateral to the umbilicus on the left side. I excised this sharply and sent it for frozen section. Frozen  section results were consistent with metastatic adenocarcinoma.  I identified the mid descending colon lesion which was adherent to the abdominal wall as well as a segment of small bowel. A 5 mm trocar was placed in the left upper quadrant. A 5 mm trocar was placed in the right upper quadrant. I mobilized the sigmoid colon distal to the lesion and the descending colon proximal to the lesion by dividing lateral attachments and keeping my plane of dissection above the gonadal vessels. I subsequently removed the [facility] trocar and made an extraction site incision and put a GelPort in here. Using hand assistance, I dissected the segment of colon and tumor off the lateral abdominal wall including some abdominal wall with the specimen. This was at the level of the left anterior superior iliac spine. Hemoclips were placed at this site.  Next I externalized the tumor which was adherent to the small bowel. I divided the small bowel proximal and distal to the area of adherence using the linear cutting stapler. I then performed a side-to-side small bowel anastomosis using the linear cutting stapler. The internal staple line was hemostatic. The common defect was closed with a linear noncutting stapler. This area was then oversewed with 3-0 silk Lembert type sutures. The mesenteric defect was closed with interrupted 3-0 silk sutures. A crotch stitch of 3-0 silk was placed. The anastomosis was patent, viable, and under no tension.  I then performed a segmental left colon resection dividing the colon proximal and distal to the tumor with the linear cutting stapler.  Small bowel and colon mesentery was divided and resected with the LigaSure. Specimen was then  handed off the field.  The distal colon stump was then marked with 2-0 Prolene sutures. A previously marked colostomy site in the left abdominal wall was approached. A circular incision was made in the skin and subcutaneous tissue. A cruciate incision was then made in  the anterior and posterior fascia.  The descending colon stump was then brought up through the incision. I mobilized it further to give me more length.  Following this, the abdominal cavity was copiously irrigated with saline solution. There is no evidence of bleeding or organ injury. The extraction site incision fascia was closed with a running #1 double looped PDS suture. The subcutaneous tissue was irrigated and the skin was closed with staples. The colostomy was then matured using interrupted 3-0 Vicryl sutures. Repeat laparoscopy was performed. The fascial closure was solid. There is no evidence of bleeding or organ injury. The remaining trochars were removed after the release of the pneumoperitoneum.  Trocar site skin incisions were closed with staples. A colostomy appliance was applied. Sterile dressings were then placed on all wounds.  He tolerated the procedure well without any apparent complications and was taken to the recovery room in satisfactory condition.  Estimated Blood Loss:  200 mL    Specimens: Segment of left colon with small bowel segment. Peritoneal nodule.        Complications:  * No complications entered in OR log *         Disposition: PACU - hemodynamically stable.         Condition: stable

## 2015-05-08 ENCOUNTER — Encounter (HOSPITAL_COMMUNITY): Payer: Self-pay | Admitting: Internal Medicine

## 2015-05-08 DIAGNOSIS — D500 Iron deficiency anemia secondary to blood loss (chronic): Secondary | ICD-10-CM

## 2015-05-08 DIAGNOSIS — K5660 Unspecified intestinal obstruction: Secondary | ICD-10-CM

## 2015-05-08 DIAGNOSIS — K56609 Unspecified intestinal obstruction, unspecified as to partial versus complete obstruction: Secondary | ICD-10-CM

## 2015-05-08 HISTORY — DX: Iron deficiency anemia secondary to blood loss (chronic): D50.0

## 2015-05-08 LAB — BASIC METABOLIC PANEL
ANION GAP: 7 (ref 5–15)
BUN: 10 mg/dL (ref 6–20)
CALCIUM: 8.5 mg/dL — AB (ref 8.9–10.3)
CO2: 24 mmol/L (ref 22–32)
Chloride: 109 mmol/L (ref 101–111)
Creatinine, Ser: 0.99 mg/dL (ref 0.61–1.24)
GFR calc Af Amer: >60 mL/min (ref >60)
Glucose, Bld: 153 mg/dL — ABNORMAL HIGH (ref 65–99)
POTASSIUM: 4.1 mmol/L (ref 3.5–5.1)
SODIUM: 140 mmol/L (ref 135–145)

## 2015-05-08 LAB — CBC
HEMATOCRIT: 29.4 % — AB (ref 39.0–52.0)
HEMOGLOBIN: 8.5 g/dL — AB (ref 13.0–17.0)
MCH: 21.3 pg — ABNORMAL LOW (ref 26.0–34.0)
MCHC: 28.9 g/dL — ABNORMAL LOW (ref 30.0–36.0)
MCV: 73.5 fL — ABNORMAL LOW (ref 78.0–100.0)
Platelets: 476 10*3/uL — ABNORMAL HIGH (ref 150–400)
RBC: 4 MIL/uL — ABNORMAL LOW (ref 4.22–5.81)
RDW: 21.2 % — AB (ref 11.5–15.5)
WBC: 15.3 10*3/uL — AB (ref 4.0–10.5)

## 2015-05-08 MED ORDER — KETOROLAC TROMETHAMINE 30 MG/ML IJ SOLN
30.0000 mg | Freq: Four times a day (QID) | INTRAMUSCULAR | Status: AC
  Administered 2015-05-08 – 2015-05-09 (×6): 30 mg via INTRAVENOUS
  Filled 2015-05-08 (×5): qty 1

## 2015-05-08 MED ORDER — BOOST / RESOURCE BREEZE PO LIQD
1.0000 | Freq: Three times a day (TID) | ORAL | Status: DC
  Administered 2015-05-08 – 2015-05-10 (×5): 1 via ORAL

## 2015-05-08 NOTE — Progress Notes (Signed)
Progress Note   [name] KKX:[id number] DOB: 04-07-1961 DOA: 05/05/2015 PCP: [name], MD   Brief Narrative:   [name] is an 54 y.o. male the PMH of diverticulitis with perforation/abscess, hypertension, obesity, and sarcoid who was admitted 05/05/15 with LUQ pain. CT of the abdomen showed moderate-severe dilatation of the right colon, transverse colon and proximal descending colon persistent narrowing in the distal portion of the descending colon concerning for possible neoplasm. Surgery and GI subsequently consulted.  Assessment/Plan:   Principal Problem:   Acute abdominal pain with colonic mass, rule out neoplasm - GI and surgery consulted on admission after CT scan showed findings consistent with a colonic mass. - Sigmoidoscopy done 05/07/15: Obstructing mass found. Surgically resected 05/07/15.  Active Problems:   Hypokalemia secondary to nausea and vomiting - Supplemented. Potassium within normal limits today.    Recent diverticulitis large intestine - Has completed multiple courses of antibiotics.    Morbid obesity with probable obesity hypoventilation syndrome - Body mass index is 41.16 kg/(m^2). - Denies a history of OSA after being tested in 2009.    Leukocytosis - Mild, likely reflective of hemoconcentration. Initially normalized with hydration. - Postoperative leukocytosis likely reflective of inflammation.    Iron deficiency anemia due to chronic blood loss  - Iron deficiency anemia likely secondary to chronic blood loss from mass in colon. - We'll place on iron supplementation when diet advanced. - Hemoglobin stable at 8.5 mg/dL postoperatively.    Pulmonary sarcoidosis - Seen by pulmonologist for preoperative clearance. Pulmonary risk felt to be minimal. - Most recent PFTs 1/ 2015 showed a normal diffusion capacity and only moderate reduction in FVC.  - Most recent CT chest in 2015 showed no evidence of pulmonary parenchymal abnormality.  - He  does not use oxygen and he has had no respiratory complaints lately.    DVT Prophylaxis - Lovenox ordered.  Family Communication: Dawn, sister, updated at the bedside 05/09/15, no family present today. Disposition Plan: Home when stable and diet advanced, likely another 2 days. Code Status:     Code Status Orders        Start     Ordered   05/05/15 1815  Full code   Continuous     05/05/15 1814        IV Access:    Peripheral IV   Procedures and diagnostic studies:   No results found.   Medical Consultants:    Pulmonology: Juanito Doom, MD  Gastroenterology: Laurence Spates, MD  Surgery: [name], MD [name], MD   Anti-Infectives:    None.  Subjective:   [name] reports postoperative soreness/pain.  No nausea or vomiting.  No dyspnea or cough.  Objective:    Filed Vitals:   05/07/15 2033 05/07/15 2327 05/08/15 0311 05/08/15 0534  BP:    143/73  Pulse:    73  Temp:    98 F (36.7 C)  TempSrc:    Oral  Resp: 13 10 14 20   Height:      Weight:      SpO2: 96% 94% 100% 96%    Intake/Output Summary (Last 24 hours) at 05/08/15 1696 Last data filed at 05/08/15 0253  Gross per 24 hour  Intake   3700 ml  Output   1150 ml  Net   2550 ml    Exam: Gen:  NAD, obese Cardiovascular:  RRR, No M/R/G Respiratory:  Lungs diminished Gastrointestinal:  Abdomen soft, tender, new ostomy  LLQ with bloody drainage in bag Extremities:  No C/E/C   Data Reviewed:    Labs: Basic Metabolic Panel:  Recent Labs Lab 05/05/15 1328 05/06/15 0408 05/07/15 0402 05/08/15 0408  NA 138 140 139 140  K 3.3* 3.6 3.6 4.1  CL 107 109 108 109  CO2 25 25 22 24   GLUCOSE 112* 89 81 153*  BUN 10 10 9 10   CREATININE 0.85 0.85 0.86 0.99  CALCIUM 8.6* 8.4* 8.1* 8.5*   GFR Estimated Creatinine Clearance: 119.2 mL/min (by C-G formula based on Cr of 0.99). Liver Function Tests:  Recent Labs Lab 05/05/15 1328  AST 22  ALT 25  ALKPHOS 80   BILITOT 0.6  PROT 7.8  ALBUMIN 3.7    Recent Labs Lab 05/05/15 1328  LIPASE 12*   CBC:  Recent Labs Lab 05/05/15 1328 05/06/15 0408 05/07/15 0402 05/08/15 0408  WBC 11.3* 10.1 9.3 15.3*  HGB 8.6* 7.6* 8.8* 8.5*  HCT 29.4* 27.3* 30.4* 29.4*  MCV 71.0* 72.4* 74.1* 73.5*  PLT 522* 478* 453* 476*   Sepsis Labs:  Recent Labs Lab 05/05/15 1328 05/05/15 1513 05/06/15 0408 05/07/15 0402 05/08/15 0408  WBC 11.3*  --  10.1 9.3 15.3*  LATICACIDVEN  --  1.2  --   --   --    Iron/TIBC/Ferritin/ %Sat    Component Value Date/Time   IRON 13* 05/07/2015 0945   TIBC 356 05/07/2015 0945   FERRITIN 8* 05/07/2015 0945   IRONPCTSAT 4* 05/07/2015 0945   Microbiology Recent Results (from the past 240 hour(s))  Surgical pcr screen     Status: None   Collection Time: 05/07/15 11:43 AM  Result Value Ref Range Status   MRSA, PCR NEGATIVE NEGATIVE Final   Staphylococcus aureus NEGATIVE NEGATIVE Final    Comment:        The Xpert SA Assay (FDA approved for NASAL specimens in patients over 48 years of age), is one component of a comprehensive surveillance program.  Test performance has been validated by [facility] for patients greater than or equal to 6 year old. It is not intended to diagnose infection nor to guide or monitor treatment.      Medications:   . cefoTEtan (CEFOTAN) IV  1 g Intravenous Q12H  . enoxaparin (LOVENOX) injection  40 mg Subcutaneous Q24H  . morphine   Intravenous 6 times per day  . pantoprazole (PROTONIX) IV  40 mg Intravenous QHS  . zolpidem  10 mg Oral QHS   Continuous Infusions: . sodium chloride 75 mL/hr at 05/06/15 1800  . dextrose 5 % and 0.9 % NaCl with KCl 20 mEq/L 100 mL/hr at 05/07/15 2101    Time spent: 25 minutes.    LOS: 3 days   [place] [facility] Pager [phone]. If unable to reach me by pager, please call my cell phone at [phone].  *Please refer to [email or web address], password [email or web address] to get updated schedule on  who will round on this patient, as hospitalists switch teams weekly. If 7PM-7AM, please contact night-coverage at [email or web address], password [email or web address] for any overnight needs.  05/08/2015, 7:22 AM

## 2015-05-08 NOTE — Progress Notes (Signed)
1 Day Post-Op  Subjective: Fair pain control with PCA Morphine  Objective: Vital signs in last 24 hours: Temp:  [97.9 F (36.6 C)-99.1 F (37.3 C)] 98 F (36.7 C) (08/25 0534) Pulse Rate:  [61-89] 73 (08/25 0534) Resp:  [10-20] 20 (08/25 0534) BP: (102-159)/(49-91) 143/73 mmHg (08/25 0534) SpO2:  [92 %-100 %] 96 % (08/25 0534) Last BM Date: 05/06/15  Intake/Output from previous day: 08/24 0701 - 08/25 0700 In: 3700 [I.V.:3700] Out: 1150 [Urine:1050; Blood:100] Intake/Output this shift:    PE: General- In NAD Abdomen-soft, dressings dry, colostomy purple and edematous with bloody output, hypoactive bowel sounds  Lab Results:   Recent Labs  05/07/15 0402 05/08/15 0408  WBC 9.3 15.3*  HGB 8.8* 8.5*  HCT 30.4* 29.4*  PLT 453* 476*   BMET  Recent Labs  05/07/15 0402 05/08/15 0408  NA 139 140  K 3.6 4.1  CL 108 109  CO2 22 24  GLUCOSE 81 153*  BUN 9 10  CREATININE 0.86 0.99  CALCIUM 8.1* 8.5*   PT/INR No results for input(s): LABPROT, INR in the last 72 hours. Comprehensive Metabolic Panel:    Component Value Date/Time   NA 140 05/08/2015 0408   NA 139 05/07/2015 0402   NA 138 01/02/2015 1555   NA 138 11/20/2012 1410   K 4.1 05/08/2015 0408   K 3.6 05/07/2015 0402   K 3.7 01/02/2015 1555   K 3.2* 11/20/2012 1410   CL 109 05/08/2015 0408   CL 108 05/07/2015 0402   CL 109 01/02/2015 1555   CL 104 11/20/2012 1410   CO2 24 05/08/2015 0408   CO2 22 05/07/2015 0402   CO2 22 01/02/2015 1555   CO2 27 11/20/2012 1410   BUN 10 05/08/2015 0408   BUN 9 05/07/2015 0402   BUN 9 01/02/2015 1555   BUN 9 11/20/2012 1410   CREATININE 0.99 05/08/2015 0408   CREATININE 0.86 05/07/2015 0402   CREATININE 1.04 01/02/2015 1555   CREATININE 0.91 11/20/2012 1410   GLUCOSE 153* 05/08/2015 0408   GLUCOSE 81 05/07/2015 0402   GLUCOSE 110* 01/02/2015 1555   GLUCOSE 95 11/20/2012 1410   CALCIUM 8.5* 05/08/2015 0408   CALCIUM 8.1* 05/07/2015 0402   CALCIUM 8.8*  01/02/2015 1555   CALCIUM 8.1* 11/20/2012 1410   AST 22 05/05/2015 1328   AST 27 01/14/2015 0604   AST 39 01/02/2015 1555   AST 24 11/20/2012 1410   ALT 25 05/05/2015 1328   ALT 33 01/14/2015 0604   ALT 55 01/02/2015 1555   ALT 38 11/20/2012 1410   ALKPHOS 80 05/05/2015 1328   ALKPHOS 80 01/14/2015 0604   ALKPHOS 125 01/02/2015 1555   ALKPHOS 123 11/20/2012 1410   BILITOT 0.6 05/05/2015 1328   BILITOT 0.3 01/14/2015 0604   BILITOT 0.5 01/02/2015 1555   BILITOT 0.5 11/20/2012 1410   PROT 7.8 05/05/2015 1328   PROT 7.0 01/14/2015 0604   PROT 8.2* 01/02/2015 1555   PROT 7.7 11/20/2012 1410   ALBUMIN 3.7 05/05/2015 1328   ALBUMIN 3.3* 01/14/2015 0604   ALBUMIN 4.0 01/02/2015 1555   ALBUMIN 3.1* 11/20/2012 1410     Studies/Results: No results found.  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  Clear liquids.  Add Toradol.   [name] [name] 05/08/2015

## 2015-05-08 NOTE — Consult Note (Addendum)
WOC ostomy consult note Stoma type/location: LMQ colostomy Stomal assessment/size: 1 inch x 2 inches, slightly budded in crease.  Red, moist Peristomal assessment: intact, with dried serosanguinous effluent surrounding skin Treatment options for stomal/peristomal skin: cleanse, place skin barrier ring and 1 piece flat flexible pouching system, Output serosanguinous Ostomy pouching: 1pc.Kellie Simmering [phone]) with skin barrier ring [phone] Education provided: patient is using PCA and has just had visit with his pastor (who has laryngeal cancer).  He is emotional.  Taught patient and his wife that I will be assisting them in this journey. Educational booklet provided.  Taught that pouches are odor free and that he will be clean and dry.  Appreciative of visit today.  Enrolled patient in [place] program: No WOC nursing team will follow, and will remain available to this patient, the nursing, surgical and medical teams.   Thanks, Maudie Flakes, MSN, RN, [place], [place], [place] [phone])

## 2015-05-08 NOTE — Care Management Note (Signed)
Case Management Note  Patient Details  Name: [name] MRN: [id number] Date of Birth: 1961-06-19  Subjective/Objective:      54 yo admitted with large bowel obstruction              Action/Plan: From home with wife  Expected Discharge Date:                  Expected Discharge Plan:  [place]  In-House Referral:  NA  Discharge planning Services  CM Consult  Post Acute Care Choice:  Home Health Choice offered to:  Patient, Spouse  DME Arranged:    DME Agency:     HH Arranged:  RN [place] Agency:  [place]  Status of Service:  In process, will continue to follow  Medicare Important Message Given:    Date Medicare IM Given:    Medicare IM give by:    Date Additional Medicare IM Given:    Additional Medicare Important Message give by:     If discussed at [place] of Stay Meetings, dates discussed:    Additional Comments: This CM met with pt and wife at bedside for disposition planning. With new ostomy, a HHRN could be beneficial to see pt at home for a period of time to reinforce ostomy teaching. Pt and wife offered choice for [facility] services. Pt has used [facility] previously and would like to use them again. [facility] rep contacted for referral. Will need HHRN order at DC. CM will continue to follow. Lynnell Catalan, RN 05/08/2015, 2:16 PM

## 2015-05-08 NOTE — Anesthesia Postprocedure Evaluation (Signed)
  Anesthesia Post-op Note  Patient: [name]  Procedure(s) Performed: Procedure(s) (LRB): LAPAROSCOPIC ASSITED PARTIAL COLECTOMY WITH COLOSTOMY, SMALL BOWEL RESECTION, EXCISION OF PERITONEAL NODULE (N/A)  Patient Location: PACU  Anesthesia Type: General  Level of Consciousness: awake and alert   Airway and Oxygen Therapy: Patient Spontanous Breathing  Post-op Pain: mild  Post-op Assessment: Post-op Vital signs reviewed, Patient's Cardiovascular Status Stable, Respiratory Function Stable, Patent Airway and No signs of Nausea or vomiting  Last Vitals:  Filed Vitals:   05/08/15 0534  BP: 143/73  Pulse: 73  Temp: 36.7 C  Resp: 20    Post-op Vital Signs: stable   Complications: No apparent anesthesia complications

## 2015-05-09 LAB — BASIC METABOLIC PANEL
Anion gap: 3 — ABNORMAL LOW (ref 5–15)
BUN: 11 mg/dL (ref 6–20)
CALCIUM: 8.2 mg/dL — AB (ref 8.9–10.3)
CHLORIDE: 111 mmol/L (ref 101–111)
CO2: 28 mmol/L (ref 22–32)
CREATININE: 0.89 mg/dL (ref 0.61–1.24)
Glucose, Bld: 124 mg/dL — ABNORMAL HIGH (ref 65–99)
Potassium: 4.6 mmol/L (ref 3.5–5.1)
SODIUM: 142 mmol/L (ref 135–145)

## 2015-05-09 LAB — CBC
HCT: 24.2 % — ABNORMAL LOW (ref 39.0–52.0)
HEMOGLOBIN: 7 g/dL — AB (ref 13.0–17.0)
MCH: 21.4 pg — ABNORMAL LOW (ref 26.0–34.0)
MCHC: 28.9 g/dL — AB (ref 30.0–36.0)
MCV: 74 fL — ABNORMAL LOW (ref 78.0–100.0)
Platelets: 332 10*3/uL (ref 150–400)
RBC: 3.27 MIL/uL — ABNORMAL LOW (ref 4.22–5.81)
RDW: 21.6 % — ABNORMAL HIGH (ref 11.5–15.5)
WBC: 11.3 10*3/uL — ABNORMAL HIGH (ref 4.0–10.5)

## 2015-05-09 MED ORDER — SODIUM CHLORIDE 0.9 % IV SOLN
510.0000 mg | Freq: Once | INTRAVENOUS | Status: AC
  Administered 2015-05-09: 510 mg via INTRAVENOUS
  Filled 2015-05-09: qty 17

## 2015-05-09 NOTE — Progress Notes (Signed)
Progress Note   [name] [id number] DOB: [birth year] DOA: 05/05/2015 PCP: [name], MD   Brief Narrative:   [name] is an 54 y.o. male the PMH of diverticulitis with perforation/abscess, hypertension, obesity, and sarcoid who was admitted 05/05/15 with LUQ pain. CT of the abdomen showed moderate-severe dilatation of the right colon, transverse colon and proximal descending colon persistent narrowing in the distal portion of the descending colon concerning for possible neoplasm. Surgery and GI subsequently consulted.  Assessment/Plan:   Principal Problem:   Acute abdominal pain with colonic mass, rule out neoplasm - GI and surgery consulted on admission after CT scan showed findings consistent with a colonic mass. - Sigmoidoscopy done 05/07/15: Obstructing mass found. Surgically resected 05/07/15. - Pathology consistent with adenocarcinoma. Will need oncology outpatient follow-up.  Active Problems:   Hypokalemia secondary to nausea and vomiting - Supplemented. Potassium remains within normal limits.    Morbid obesity with probable obesity hypoventilation syndrome - Body mass index is 41.16 kg/(m^2). - Denies a history of OSA after being tested in 2009.    Leukocytosis - Mild, likely reflective of hemoconcentration. Initially normalized with hydration. - Postoperative leukocytosis likely reflective of inflammation, WBC trending down.    Iron deficiency anemia due to chronic blood loss  - Iron deficiency anemia likely secondary to chronic blood loss from mass in colon. - 1.5 g drop in hemoglobin overnight. We'll give IV iron.    Pulmonary sarcoidosis - Seen by pulmonologist for preoperative clearance. Pulmonary risk felt to be minimal. - Most recent PFTs 1/ 2015 showed a normal diffusion capacity and only moderate reduction in FVC.  - Most recent CT chest in 2015 showed no evidence of pulmonary parenchymal abnormality.  - He does not use oxygen and he has had no  respiratory complaints lately.    DVT Prophylaxis - Lovenox ordered.  Family Communication: Dawn, sister, updated at the bedside 05/09/15, no family present today. Disposition Plan: Home when stable and diet advanced, likely another 2 days. Code Status:     Code Status Orders        Start     Ordered   05/05/15 1815  Full code   Continuous     05/05/15 1814        IV Access:    Peripheral IV   Procedures and diagnostic studies:   No results found.   Medical Consultants:    Pulmonology: Juanito Doom, MD  Gastroenterology: Laurence Spates, MD  Surgery: [name], MD [name], MD   Anti-Infectives:    None.  Subjective:   [name] reports ongoing postoperative soreness/pain. Had a little nausea today, which she thinks may have been from the IV iron.  No dyspnea or cough.  Objective:    Filed Vitals:   05/08/15 2041 05/09/15 0302 05/09/15 0516 05/09/15 0723  BP: 140/50  139/76   Pulse: 68  74   Temp: 98 F (36.7 C)  97.7 F (36.5 C)   TempSrc: Oral  Oral   Resp: 18 10 14 12   Height:      Weight:      SpO2: 100% 97% 100% 100%    Intake/Output Summary (Last 24 hours) at 05/09/15 0751 Last data filed at 05/08/15 1900  Gross per 24 hour  Intake    560 ml  Output    550 ml  Net     10 ml    Exam: Gen:  NAD, obese, sitting up in chair  today Cardiovascular:  RRR, No M/R/G Respiratory:  Lungs diminished Gastrointestinal:  Abdomen soft, tender, new ostomy LLQ with minimal bloody drainage in bag Extremities:  No C/E/C   Data Reviewed:    Labs: Basic Metabolic Panel:  Recent Labs Lab 05/05/15 1328 05/06/15 0408 05/07/15 0402 05/08/15 0408 05/09/15 0435  NA 138 140 139 140 142  K 3.3* 3.6 3.6 4.1 4.6  CL 107 109 108 109 111  CO2 25 25 22 24 28   GLUCOSE 112* 89 81 153* 124*  BUN 10 10 9 10 11   CREATININE 0.85 0.85 0.86 0.99 0.89  CALCIUM 8.6* 8.4* 8.1* 8.5* 8.2*   GFR Estimated Creatinine Clearance: 132.6  mL/min (by C-G formula based on Cr of 0.89). Liver Function Tests:  Recent Labs Lab 05/05/15 1328  AST 22  ALT 25  ALKPHOS 80  BILITOT 0.6  PROT 7.8  ALBUMIN 3.7    Recent Labs Lab 05/05/15 1328  LIPASE 12*   CBC:  Recent Labs Lab 05/05/15 1328 05/06/15 0408 05/07/15 0402 05/08/15 0408 05/09/15 0435  WBC 11.3* 10.1 9.3 15.3* 11.3*  HGB 8.6* 7.6* 8.8* 8.5* 7.0*  HCT 29.4* 27.3* 30.4* 29.4* 24.2*  MCV 71.0* 72.4* 74.1* 73.5* 74.0*  PLT 522* 478* 453* 476* 332   Sepsis Labs:  Recent Labs Lab 05/05/15 1513 05/06/15 0408 05/07/15 0402 05/08/15 0408 05/09/15 0435  WBC  --  10.1 9.3 15.3* 11.3*  LATICACIDVEN 1.2  --   --   --   --    Iron/TIBC/Ferritin/ %Sat    Component Value Date/Time   IRON 13* 05/07/2015 0945   TIBC 356 05/07/2015 0945   FERRITIN 8* 05/07/2015 0945   IRONPCTSAT 4* 05/07/2015 0945   Microbiology Recent Results (from the past 240 hour(s))  Surgical pcr screen     Status: None   Collection Time: 05/07/15 11:43 AM  Result Value Ref Range Status   MRSA, PCR NEGATIVE NEGATIVE Final   Staphylococcus aureus NEGATIVE NEGATIVE Final    Comment:        The Xpert SA Assay (FDA approved for NASAL specimens in patients over 89 years of age), is one component of a comprehensive surveillance program.  Test performance has been validated by [facility] for patients greater than or equal to 38 year old. It is not intended to diagnose infection nor to guide or monitor treatment.      Medications:   . enoxaparin (LOVENOX) injection  40 mg Subcutaneous Q24H  . feeding supplement  1 Container Oral TID BM  . ketorolac  30 mg Intravenous 4 times per day  . morphine   Intravenous 6 times per day  . pantoprazole (PROTONIX) IV  40 mg Intravenous QHS  . zolpidem  10 mg Oral QHS   Continuous Infusions: . dextrose 5 % and 0.9 % NaCl with KCl 20 mEq/L 100 mL/hr at 05/08/15 1756    Time spent: 25 minutes.    LOS: 4 days    [place] [facility] Pager [phone]. If unable to reach me by pager, please call my cell phone at [phone].  *Please refer to [email or web address], password [email or web address] to get updated schedule on who will round on this patient, as hospitalists switch teams weekly. If 7PM-7AM, please contact night-coverage at [email or web address], password [email or web address] for any overnight needs.  05/09/2015, 7:51 AM

## 2015-05-09 NOTE — Consult Note (Addendum)
WOC ostomy follow-up consult note Stoma type/location: LMQ colostomy Stomal assessment/size: Red and moist, flush with skin level.  Dried red blood surrounding dusky stoma, 11/2 inches and oval. Peristomal assessment: Intact skin surrounding stoma Output: Mod amt semiformed brown stool Ostomy pouching: 1pc.Kellie Simmering [phone] with skin barrier ring [phone] Education provided: Demonstrated pouch change using barrier ring and one piece pouch.  Pt is able to open and close velcro to empty.  Discussed pouching routines.  Educational booklet at bedside, supplies ordered for staff nurse use. Pt asks appropriate questions. Enrolled patient in [place] program: Yes [place] team will continue to follow for further educational sessions. Julien Girt MSN, RN, [place], [place], [place]

## 2015-05-09 NOTE — Progress Notes (Signed)
2 Days Post-Op  Subjective: Less sore today.  Tolerating clear liquids.  In speaking with him, he noticed decreased stool caliber beginning September 2015 with intermittent burgundy stools.  Objective: Vital signs in last 24 hours: Temp:  [97.7 F (36.5 C)-98 F (36.7 C)] 97.7 F (36.5 C) (08/26 0516) Pulse Rate:  [68-74] 74 (08/26 0516) Resp:  [10-19] 12 (08/26 0723) BP: (121-140)/(50-76) 139/76 mmHg (08/26 0516) SpO2:  [96 %-100 %] 100 % (08/26 0723) Last BM Date: 05/06/15  Intake/Output from previous day: 08/25 0701 - 08/26 0700 In: 560 [P.O.:560] Out: 550 [Urine:550] Intake/Output this shift:    PE: General- In NAD Abdomen-soft, dressings dry, colostomy purple and edematous with minimal bloody output, hypoactive bowel sounds  Lab Results:   Recent Labs  05/08/15 0408 05/09/15 0435  WBC 15.3* 11.3*  HGB 8.5* 7.0*  HCT 29.4* 24.2*  PLT 476* 332   BMET  Recent Labs  05/08/15 0408 05/09/15 0435  NA 140 142  K 4.1 4.6  CL 109 111  CO2 24 28  GLUCOSE 153* 124*  BUN 10 11  CREATININE 0.99 0.89  CALCIUM 8.5* 8.2*   PT/INR No results for input(s): LABPROT, INR in the last 72 hours. Comprehensive Metabolic Panel:    Component Value Date/Time   NA 142 05/09/2015 0435   NA 140 05/08/2015 0408   NA 138 01/02/2015 1555   NA 138 11/20/2012 1410   K 4.6 05/09/2015 0435   K 4.1 05/08/2015 0408   K 3.7 01/02/2015 1555   K 3.2* 11/20/2012 1410   CL 111 05/09/2015 0435   CL 109 05/08/2015 0408   CL 109 01/02/2015 1555   CL 104 11/20/2012 1410   CO2 28 05/09/2015 0435   CO2 24 05/08/2015 0408   CO2 22 01/02/2015 1555   CO2 27 11/20/2012 1410   BUN 11 05/09/2015 0435   BUN 10 05/08/2015 0408   BUN 9 01/02/2015 1555   BUN 9 11/20/2012 1410   CREATININE 0.89 05/09/2015 0435   CREATININE 0.99 05/08/2015 0408   CREATININE 1.04 01/02/2015 1555   CREATININE 0.91 11/20/2012 1410   GLUCOSE 124* 05/09/2015 0435   GLUCOSE 153* 05/08/2015 0408   GLUCOSE 110*  01/02/2015 1555   GLUCOSE 95 11/20/2012 1410   CALCIUM 8.2* 05/09/2015 0435   CALCIUM 8.5* 05/08/2015 0408   CALCIUM 8.8* 01/02/2015 1555   CALCIUM 8.1* 11/20/2012 1410   AST 22 05/05/2015 1328   AST 27 01/14/2015 0604   AST 39 01/02/2015 1555   AST 24 11/20/2012 1410   ALT 25 05/05/2015 1328   ALT 33 01/14/2015 0604   ALT 55 01/02/2015 1555   ALT 38 11/20/2012 1410   ALKPHOS 80 05/05/2015 1328   ALKPHOS 80 01/14/2015 0604   ALKPHOS 125 01/02/2015 1555   ALKPHOS 123 11/20/2012 1410   BILITOT 0.6 05/05/2015 1328   BILITOT 0.3 01/14/2015 0604   BILITOT 0.5 01/02/2015 1555   BILITOT 0.5 11/20/2012 1410   PROT 7.8 05/05/2015 1328   PROT 7.0 01/14/2015 0604   PROT 8.2* 01/02/2015 1555   PROT 7.7 11/20/2012 1410   ALBUMIN 3.7 05/05/2015 1328   ALBUMIN 3.3* 01/14/2015 0604   ALBUMIN 4.0 01/02/2015 1555   ALBUMIN 3.1* 11/20/2012 1410     Studies/Results:  Pathology from colonoscopy biopsy:  Adenocarcinoma  Anti-infectives: Anti-infectives    Start     Dose/Rate Route Frequency Ordered Stop   05/07/15 2200  cefoTEtan (CEFOTAN) 1 g in dextrose 5 % 50 mL IVPB  1 g 100 mL/hr over 30 Minutes Intravenous Every 12 hours 05/07/15 2035 05/08/15 1025   05/07/15 1045  cefoTEtan (CEFOTAN) 2 g in dextrose 5 % 50 mL IVPB     2 g 100 mL/hr over 30 Minutes Intravenous 30 min pre-op 05/07/15 1038 05/07/15 1249      Assessment Principal Problem:   Large bowel obstruction secondary to 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  Full liquids.  Outpatient Medical Oncology follow up.  [facility] for colostomy  care ([facility]) arranged by Case Management   [name] 05/09/2015

## 2015-05-09 NOTE — Progress Notes (Signed)
Foley catheter removed per order at 0520. Hortencia Conradi RN

## 2015-05-10 ENCOUNTER — Encounter (HOSPITAL_COMMUNITY): Payer: Self-pay | Admitting: Surgery

## 2015-05-10 DIAGNOSIS — C186 Malignant neoplasm of descending colon: Secondary | ICD-10-CM

## 2015-05-10 DIAGNOSIS — C772 Secondary and unspecified malignant neoplasm of intra-abdominal lymph nodes: Secondary | ICD-10-CM

## 2015-05-10 LAB — TYPE AND SCREEN
ABO/RH(D): O POS
Antibody Screen: NEGATIVE
UNIT DIVISION: 0
UNIT DIVISION: 0
Unit division: 0

## 2015-05-10 LAB — CBC
HEMATOCRIT: 24.2 % — AB (ref 39.0–52.0)
HEMOGLOBIN: 7 g/dL — AB (ref 13.0–17.0)
MCH: 21.4 pg — AB (ref 26.0–34.0)
MCHC: 28.9 g/dL — AB (ref 30.0–36.0)
MCV: 74 fL — AB (ref 78.0–100.0)
Platelets: 360 10*3/uL (ref 150–400)
RBC: 3.27 MIL/uL — ABNORMAL LOW (ref 4.22–5.81)
RDW: 22.1 % — ABNORMAL HIGH (ref 11.5–15.5)
WBC: 8.7 10*3/uL (ref 4.0–10.5)

## 2015-05-10 MED ORDER — HYDROCODONE-ACETAMINOPHEN 5-325 MG PO TABS
1.0000 | ORAL_TABLET | ORAL | Status: DC | PRN
  Administered 2015-05-10 (×2): 2 via ORAL
  Administered 2015-05-10 (×2): 1 via ORAL
  Administered 2015-05-11 (×2): 2 via ORAL
  Filled 2015-05-10: qty 2
  Filled 2015-05-10: qty 1
  Filled 2015-05-10 (×2): qty 2
  Filled 2015-05-10: qty 1
  Filled 2015-05-10: qty 2

## 2015-05-10 MED ORDER — MORPHINE SULFATE (PF) 2 MG/ML IV SOLN
1.0000 mg | Freq: Three times a day (TID) | INTRAVENOUS | Status: DC | PRN
  Administered 2015-05-10: 1 mg via INTRAVENOUS
  Filled 2015-05-10: qty 1

## 2015-05-10 NOTE — Progress Notes (Signed)
General Surgery Note  LOS: 5 days  POD -  3 Days Post-Op  Assessment/Plan: 1.  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  To stop PCA and get on oral meds.  If he continues to do this well, probably home Sunday or Monday.  2.  Pulmonary sarcoidosis 3.  Morbid obesity 4.  Anemia -   Hgb - 7.0 - 05/10/2015  5.  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  Minimal abdominal pain.  In the room by himself. Objective:   Filed Vitals:   05/10/15 0802  BP:   Pulse:   Temp:   Resp: 16     Intake/Output from previous day:  08/26 0701 - 08/27 0700 In: 1642 [P.O.:480; I.V.:1162] Out: 1850 [Urine:1675; Stool:175]  Intake/Output this shift:  Total I/O In: -  Out: 300 [Urine:300]   Physical Exam:   General: WN obese WM who is alert and oriented.    HEENT: Normal. Pupils equal. .   Lungs: Clear.   Abdomen: Soft (though obese)   Wound: Colostomy is almost central abdomen. Wounds look good.   Lab Results:    Recent Labs  05/09/15 0435 05/10/15 0418  WBC 11.3* 8.7  HGB 7.0* 7.0*  HCT 24.2* 24.2*  PLT 332 360    BMET   Recent Labs  05/08/15 0408 05/09/15 0435  NA 140 142  K 4.1 4.6  CL 109 111  CO2 24 28  GLUCOSE 153* 124*  BUN 10 11  CREATININE 0.99 0.89  CALCIUM 8.5* 8.2*    PT/INR  No results for input(s): LABPROT, INR in the last 72 hours.  ABG  No results for input(s): PHART, HCO3 in the last 72 hours.  Invalid input(s):  PCO2, PO2   Studies/Results:  No results found.   Anti-infectives:   Anti-infectives    Start     Dose/Rate Route Frequency Ordered Stop   05/07/15 2200  cefoTEtan (CEFOTAN) 1 g in dextrose 5 % 50 mL IVPB     1 g 100 mL/hr over 30 Minutes Intravenous Every 12 hours 05/07/15 2035 05/08/15 1025   05/07/15 1045  cefoTEtan (CEFOTAN) 2 g in dextrose 5 % 50 mL IVPB     2 g 100 mL/hr over 30 Minutes Intravenous 30 min pre-op 05/07/15 1038 05/07/15 1249      Alphonsa Overall, MD, FACS Pager: [place] [facility] [facility] Office: [phone] 05/10/2015

## 2015-05-10 NOTE — Progress Notes (Signed)
Progress Note   [name] TIR:[id number] DOB: 08/14/1961 DOA: 05/05/2015 PCP: [name], MD   Brief Narrative:   [name] is an 54 y.o. male the PMH of diverticulitis with perforation/abscess, hypertension, obesity, and sarcoid who was admitted 05/05/15 with LUQ pain. CT of the abdomen showed moderate-severe dilatation of the right colon, transverse colon and proximal descending colon persistent narrowing in the distal portion of the descending colon concerning for possible neoplasm. Surgery and GI subsequently consulted.  Assessment/Plan:   Principal Problem:   Acute abdominal pain with colonic mass, rule out neoplasm - GI and surgery consulted on admission after CT scan showed findings consistent with a colonic mass. - Sigmoidoscopy done 05/07/15: Obstructing mass found. Surgically resected 05/07/15. - Pathology consistent with adenocarcinoma. Will need oncology outpatient follow-up. - Diet advanced, mobilize.  Active Problems:   Hypokalemia secondary to nausea and vomiting - Supplemented. Potassium remains within normal limits.    Morbid obesity with probable obesity hypoventilation syndrome - Body mass index is 41.16 kg/(m^2). - Denies a history of OSA after being tested in 2009.    Leukocytosis - Mild, likely reflective of hemoconcentration. Initially normalized with hydration. - Postoperative leukocytosis likely reflective of inflammation, WBC trending down.    Iron deficiency anemia due to chronic blood loss  - Iron deficiency anemia likely secondary to chronic blood loss from mass in colon. - Hemoglobin remains stable at 7 mg/dL after an initial postoperative drop. Received IV iron 05/09/15.    Pulmonary sarcoidosis - Seen by pulmonologist for preoperative clearance. Pulmonary risk felt to be minimal. - Most recent PFTs 1/ 2015 showed a normal diffusion capacity and only moderate reduction in FVC.  - Most recent CT chest in 2015 showed no evidence of  pulmonary parenchymal abnormality.  - He does not use oxygen and he has had no respiratory complaints lately.    DVT Prophylaxis - Lovenox ordered.  Family Communication: Dawn, sister, updated at the bedside 05/09/15, no family present today. Disposition Plan: Home when stable and diet advanced, likely another 2 days. Code Status:     Code Status Orders        Start     Ordered   05/05/15 1815  Full code   Continuous     05/05/15 1814        IV Access:    Peripheral IV   Procedures and diagnostic studies:   No results found.   Medical Consultants:    Pulmonology: Juanito Doom, MD  Gastroenterology: Laurence Spates, MD  Surgery: [name], MD [name], MD   Anti-Infectives:    None.  Subjective:   Dionte Blaustein Carnegie reports decreased soreness/pain. No nausea today.  No dyspnea or cough.  Feels better.  Objective:    Filed Vitals:   05/09/15 2343 05/10/15 0400 05/10/15 0535 05/10/15 0629  BP:   130/73   Pulse:   74   Temp:   97.7 F (36.5 C)   TempSrc:   Oral   Resp: 18 16 16    Height:      Weight:    145.786 kg (321 lb 6.4 oz)  SpO2: 98% 100% 100%     Intake/Output Summary (Last 24 hours) at 05/10/15 0800 Last data filed at 05/10/15 0630  Gross per 24 hour  Intake   1642 ml  Output   1850 ml  Net   -208 ml    Exam: Gen:  NAD Cardiovascular:  RRR, No M/R/G Respiratory:  Lungs  diminished Gastrointestinal:  Abdomen soft, tender, ostomy LLQ with liquid brown stool in bag Extremities:  No C/E/C   Data Reviewed:    Labs: Basic Metabolic Panel:  Recent Labs Lab 05/05/15 1328 05/06/15 0408 05/07/15 0402 05/08/15 0408 05/09/15 0435  NA 138 140 139 140 142  K 3.3* 3.6 3.6 4.1 4.6  CL 107 109 108 109 111  CO2 25 25 22 24 28   GLUCOSE 112* 89 81 153* 124*  BUN 10 10 9 10 11   CREATININE 0.85 0.85 0.86 0.99 0.89  CALCIUM 8.6* 8.4* 8.1* 8.5* 8.2*   GFR Estimated Creatinine Clearance: 137.3 mL/min (by C-G formula based  on Cr of 0.89). Liver Function Tests:  Recent Labs Lab 05/05/15 1328  AST 22  ALT 25  ALKPHOS 80  BILITOT 0.6  PROT 7.8  ALBUMIN 3.7    Recent Labs Lab 05/05/15 1328  LIPASE 12*   CBC:  Recent Labs Lab 05/06/15 0408 05/07/15 0402 05/08/15 0408 05/09/15 0435 05/10/15 0418  WBC 10.1 9.3 15.3* 11.3* 8.7  HGB 7.6* 8.8* 8.5* 7.0* 7.0*  HCT 27.3* 30.4* 29.4* 24.2* 24.2*  MCV 72.4* 74.1* 73.5* 74.0* 74.0*  PLT 478* 453* 476* 332 360   Sepsis Labs:  Recent Labs Lab 05/05/15 1513  05/07/15 0402 05/08/15 0408 05/09/15 0435 05/10/15 0418  WBC  --   < > 9.3 15.3* 11.3* 8.7  LATICACIDVEN 1.2  --   --   --   --   --   < > = values in this interval not displayed. Iron/TIBC/Ferritin/ %Sat    Component Value Date/Time   IRON 13* 05/07/2015 0945   TIBC 356 05/07/2015 0945   FERRITIN 8* 05/07/2015 0945   IRONPCTSAT 4* 05/07/2015 0945   Microbiology Recent Results (from the past 240 hour(s))  Surgical pcr screen     Status: None   Collection Time: 05/07/15 11:43 AM  Result Value Ref Range Status   MRSA, PCR NEGATIVE NEGATIVE Final   Staphylococcus aureus NEGATIVE NEGATIVE Final    Comment:        The Xpert SA Assay (FDA approved for NASAL specimens in patients over 7 years of age), is one component of a comprehensive surveillance program.  Test performance has been validated by [facility] for patients greater than or equal to 85 year old. It is not intended to diagnose infection nor to guide or monitor treatment.      Medications:   . enoxaparin (LOVENOX) injection  40 mg Subcutaneous Q24H  . feeding supplement  1 Container Oral TID BM  . morphine   Intravenous 6 times per day  . pantoprazole (PROTONIX) IV  40 mg Intravenous QHS  . zolpidem  10 mg Oral QHS   Continuous Infusions: . dextrose 5 % and 0.9 % NaCl with KCl 20 mEq/L 100 mL/hr at 05/10/15 0100    Time spent: 25 minutes.    LOS: 5 days   [facility] Pager  [phone]. If unable to reach me by pager, please call my cell phone at [phone].  *Please refer to [email or web address], password [email or web address] to get updated schedule on who will round on this patient, as hospitalists switch teams weekly. If 7PM-7AM, please contact night-coverage at [email or web address], password [email or web address] for any overnight needs.  05/10/2015, 8:00 AM

## 2015-05-10 NOTE — Progress Notes (Signed)
MD, Patient has had a 20 lb weight gain from 08/24-8/27.  He was weighed this am on the stand up scale.  On 8/24 he was weighed using the same method.[name]

## 2015-05-11 LAB — CBC
HEMATOCRIT: 27.4 % — AB (ref 39.0–52.0)
HEMOGLOBIN: 7.9 g/dL — AB (ref 13.0–17.0)
MCH: 21.2 pg — AB (ref 26.0–34.0)
MCHC: 28.8 g/dL — ABNORMAL LOW (ref 30.0–36.0)
MCV: 73.5 fL — AB (ref 78.0–100.0)
PLATELETS: 414 10*3/uL — AB (ref 150–400)
RBC: 3.73 MIL/uL — AB (ref 4.22–5.81)
RDW: 22.6 % — ABNORMAL HIGH (ref 11.5–15.5)
WBC: 7.2 10*3/uL (ref 4.0–10.5)

## 2015-05-11 MED ORDER — PANTOPRAZOLE SODIUM 40 MG PO TBEC: 40.0000 mg | DELAYED_RELEASE_TABLET | Freq: Every day | ORAL | Status: DC

## 2015-05-11 MED ORDER — ONDANSETRON HCL 4 MG PO TABS: 4.0000 mg | ORAL_TABLET | Freq: Four times a day (QID) | ORAL | Status: DC | PRN

## 2015-05-11 MED ORDER — HYDROCODONE-ACETAMINOPHEN 5-325 MG PO TABS: 1.0000 | ORAL_TABLET | ORAL | Status: DC | PRN

## 2015-05-11 NOTE — Progress Notes (Signed)
General Surgery Note  LOS: 6 days  POD -  4 Days Post-Op  Assessment/Plan: 1.  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  Colostomy working.  Okay to go home from our standpoint.  He should see Dr. Zella Richer in 1 to 2 weeks for staple removal (will remove staples from small wounds now) and review of future plans.  It sounds like home health care has been arranged to help him with the colostomy   2.  Pulmonary sarcoidosis 3.  Morbid obesity 4.  Anemia -   Hgb - 7.9 - 05/11/2015  5.  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  Looks good.  Ready to go home.  In the room by himself. Objective:   Filed Vitals:   05/11/15 0436  BP: 128/56  Pulse: 69  Temp: 98.3 F (36.8 C)  Resp: 16     Intake/Output from previous day:  08/27 0701 - 08/28 0700 In: 600 [P.O.:600] Out: 2775 [Urine:2500; Stool:275]  Intake/Output this shift:      Physical Exam:   General: WN obese WM who is alert and oriented.    HEENT: Normal. Pupils equal. .   Lungs: Clear.   Abdomen: Soft (though obese)   Wound: Colostomy - left mid abdomen. Wounds look good.   Lab Results:     Recent Labs  05/10/15 0418 05/11/15 0430  WBC 8.7 7.2  HGB 7.0* 7.9*  HCT 24.2* 27.4*  PLT 360 414*    BMET    Recent Labs  05/09/15 0435  NA 142  K 4.6  CL 111  CO2 28  GLUCOSE 124*  BUN 11  CREATININE 0.89  CALCIUM 8.2*    PT/INR  No results for input(s): LABPROT, INR in the last 72  hours.  ABG  No results for input(s): PHART, HCO3 in the last 72 hours.  Invalid input(s): PCO2, PO2   Studies/Results:  No results found.   Anti-infectives:   Anti-infectives    Start     Dose/Rate Route Frequency Ordered Stop   05/07/15 2200  cefoTEtan (CEFOTAN) 1 g in dextrose 5 % 50 mL IVPB     1 g 100 mL/hr over 30 Minutes Intravenous Every 12 hours 05/07/15 2035 05/08/15 1025   05/07/15 1045  cefoTEtan (CEFOTAN) 2 g in dextrose 5 % 50 mL IVPB     2 g 100 mL/hr over 30 Minutes Intravenous 30 min pre-op 05/07/15 1038 05/07/15 1249      [name] Overall, MD, FACS Pager: [place] [facility] Office: [phone] 05/11/2015

## 2015-05-11 NOTE — Progress Notes (Signed)
Pharmacy IV to PO conversion  The patient is receiving PANTOPRAZOLE by the intravenous route.  Based on criteria approved by the Pharmacy and [facility], the medication is being converted to the equivalent oral dose form.   No active GI bleeding or impaired absorption  Not s/p esophagectomy  Documented ability to take oral medications for > 24 hr  Plan to continue treatment for at least 1 day  If you have any questions about this conversion, please contact the Pharmacy Department (ext [phone]).  Thank you.  Reuel Boom, PharmD Pager: [phone] 05/11/2015, 9:39 AM

## 2015-05-11 NOTE — Discharge Summary (Signed)
Physician Discharge Summary  [name] SHF:[id number] DOB: 11-13-1960 DOA: 05/05/2015  PCP: [name], MD  Admit date: 05/05/2015 Discharge date: 05/11/2015   Recommendations for Outpatient Follow-Up:   1. F/U pathology from colon cancer surgical resection with referral to oncology.   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    Discharge Condition: Improved.  Diet recommendation: Low sodium, heart healthy.    Wound care: Home health RN for ostomy care.   History of Present Illness:   [name] is an 54 y.o. male the PMH of diverticulitis with perforation/abscess, hypertension, obesity, and sarcoid who was admitted 05/05/15 with LUQ pain. CT of the abdomen showed moderate-severe dilatation of the right colon, transverse colon and proximal descending colon persistent narrowing in the distal portion of the descending colon concerning for possible neoplasm. Surgery and GI subsequently consulted.   Hospital Course by Problem:   Principal Problem:  Acute abdominal pain with colonic mass, rule out neoplasm - GI and surgery consulted on admission after CT scan showed findings consistent with a colonic mass. - Sigmoidoscopy done 05/07/15: Obstructing mass found. Surgically resected 05/07/15. - Pathology consistent with adenocarcinoma. Will need oncology outpatient follow-up. - Diet advanced, mobilized prior to D/C with good tolerance.  Active Problems:  Hypokalemia secondary to nausea and vomiting - Supplemented. Potassium remains within normal limits.   Morbid obesity with probable obesity hypoventilation syndrome - Body mass  index is 41.16 kg/(m^2). - Denies a history of OSA after being tested in 2009.   Leukocytosis - Mild, likely reflective of hemoconcentration. Initially normalized with hydration. - Postoperative leukocytosis likely reflective of inflammation, WBC now WNL.   Iron deficiency anemia due to chronic blood loss  - Iron deficiency anemia likely secondary to chronic blood loss from mass in colon. - Hemoglobin remains stable at 7.9 mg/dL after an initial postoperative drop. Received IV iron 05/09/15.   Pulmonary sarcoidosis - Seen by pulmonologist for preoperative clearance. Pulmonary risk felt to be minimal. - Most recent PFTs 1/ 2015 showed a normal diffusion capacity and only moderate reduction in FVC.  - Most recent CT chest in 2015 showed no evidence of pulmonary parenchymal abnormality.  - He does not use oxygen and he has had no respiratory complaints lately.    Medical Consultants:    Pulmonology: Juanito Doom, MD  Gastroenterology: Laurence Spates, MD  Surgery: [name], MD   Discharge Exam:   Filed Vitals:   05/11/15 0436  BP: 128/56  Pulse: 69  Temp: 98.3 F (36.8 C)  Resp: 16   Filed Vitals:   05/10/15 1404 05/10/15 1438 05/10/15 2023 05/11/15 0436  BP: 112/54 132/70 127/82 128/56  Pulse: 66  81 69  Temp: 98 F (36.7 C)  98 F (36.7 C) 98.3 F (36.8 C)  TempSrc: Oral  Oral Oral  Resp: 16  16 16   Height:      Weight:    142.747 kg (314 lb 11.2 oz)  SpO2: 99%  97% 97%    Gen:  NAD Cardiovascular:  RRR, No M/R/G Respiratory: Lungs CTAB Gastrointestinal: Abdomen soft, NT/ND with normal active  bowel sounds, ostomy with brown liquid stools Extremities: No C/E/C   The results of significant diagnostics from this hospitalization (including imaging, microbiology, ancillary and laboratory) are listed below for reference.     Procedures and Diagnostic Studies:   Ct Abdomen Pelvis W Contrast  05/05/2015   CLINICAL DATA:  Acute generalized  abdominal pain.  EXAM: CT ABDOMEN AND PELVIS WITH CONTRAST  TECHNIQUE: Multidetector CT imaging of the abdomen and pelvis was performed using the standard protocol following bolus administration of intravenous contrast.  CONTRAST:  18mL OMNIPAQUE IOHEXOL 300 MG/ML  SOLN  COMPARISON:  None.  FINDINGS: Visualized lung bases appear normal. No significant osseous abnormality is noted.  No gallstones are noted. The liver, spleen and pancreas appear normal. Adrenal glands appear normal. Bilateral nonobstructive nephrolithiasis is noted. No hydronephrosis or renal obstruction is noted. No ureteral calculi are noted. There appears to be moderate to severe dilatation of the right colon, transverse colon and proximal descending colon with persistent narrowing seen in the distal portion of the descending colon. There appears to be a soft tissue mass in this area concerning for possible neoplasm. This measures 3.4 x 3.1 cm. It appears to extend to an adjacent loop of small bowel, but does not appear to cause small bowel obstruction. Urinary bladder appears normal. No significant adenopathy is noted. Mass is noted along the anterior portion the peritoneal measuring 29 x 11 mm concerning for carcinomatosis, best seen on image number 61 of series 2.  IMPRESSION: Bilateral nonobstructive nephrolithiasis.  Obstruction of the ascending, transverse and proximal descending colon is noted secondary to probable mass in distal portion of descending colon. This soft tissue abnormality appears to extend to adjacent loop of small bowel. 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 Colonoscopy is recommended to evaluate for possible colonic malignancy.   Electronically Signed   By: Marijo Conception, M.D.   On: 05/05/2015 16:14   Ct Abdomen Pelvis W Contrast  04/14/2015   CLINICAL DATA:  Left lower quadrant pain. Severe nausea and vomiting. Diverticulitis.  Nephrolithiasis.  EXAM: CT ABDOMEN AND PELVIS WITH CONTRAST  TECHNIQUE: Multidetector CT imaging of the abdomen and pelvis was performed using the standard protocol following bolus administration of intravenous contrast.  CONTRAST:  141mL OMNIPAQUE IOHEXOL 350 MG/ML SOLN  COMPARISON:  02/07/2015 and 12/17/2014  FINDINGS: Lower Chest: No acute findings.  Hepatobiliary: Diffuse hepatic steatosis again demonstrated. No liver masses are identified. Gallbladder is unremarkable.  Pancreas: No mass, inflammatory changes, or other significant abnormality identified.  Spleen:  Within normal limits in size and appearance.  Adrenals:  No masses identified.  Kidneys/Urinary Tract: No evidence of masses or hydronephrosis. Small bilateral less than 5 mm nonobstructive renal calculi are again seen.  Stomach/Bowel/Peritoneum: Short segment concentric wall thickening is again seen involving the distal descending colon, with soft tissue stranding in the adjacent pericolonic fat. There is also wall thickening and involving an adjacent loop of small bowel. As this shows no significant change compared to several previous exams, differential diagnosis includes both colon carcinoma and refractory diverticulitis. There is no evidence of colonic or small bowel obstruction. No evidence of abscess or free fluid.  Vascular/Lymphatic: No pathologically enlarged lymph nodes identified. No abdominal aortic aneurysm or other significant retroperitoneal abnormality demonstrated.  Reproductive:  No mass or other significant abnormality identified.  Other:  None.  Musculoskeletal:  No suspicious bone lesions identified.  IMPRESSION: Persistent short segment concentric wall thickening involving the distal  descending colon, with pericolonic stranding and wall thickening involving an adjacent small bowel loop. Differential diagnosis includes both colon carcinoma and refractory diverticulitis. Colonoscopy or surgical evaluation should be considered.  No  evidence of abscess or bowel obstruction.  No evidence of metastatic disease.  Diffuse hepatic steatosis.  Bilateral nonobstructive nephrolithiasis.   Electronically Signed   By: Earle Gell M.D.   On: 04/14/2015 16:04   Flexible Sigmoidoscopy 05/07/15 ENDOSCOPIC IMPRESSION: 1. Obstructing Mass. Appears to be intrinsic and likely is a tumor located at the junction of the descending and sigmoid colon.  Laparoscopic hand-assisted partial colectomy, small bowel resection, excision of peritoneal implant from anterior abdominal wall, colostomy 05/07/15     Labs:   Basic Metabolic Panel:  Recent Labs Lab 05/05/15 1328 05/06/15 0408 05/07/15 0402 05/08/15 0408 05/09/15 0435  NA 138 140 139 140 142  K 3.3* 3.6 3.6 4.1 4.6  CL 107 109 108 109 111  CO2 25 25 22 24 28   GLUCOSE 112* 89 81 153* 124*  BUN 10 10 9 10 11   CREATININE 0.85 0.85 0.86 0.99 0.89  CALCIUM 8.6* 8.4* 8.1* 8.5* 8.2*   GFR Estimated Creatinine Clearance: 135.7 mL/min (by C-G formula based on Cr of 0.89). Liver Function Tests:  Recent Labs Lab 05/05/15 1328  AST 22  ALT 25  ALKPHOS 80  BILITOT 0.6  PROT 7.8  ALBUMIN 3.7    Recent Labs Lab 05/05/15 1328  LIPASE 12*   CBC:  Recent Labs Lab 05/07/15 0402 05/08/15 0408 05/09/15 0435 05/10/15 0418 05/11/15 0430  WBC 9.3 15.3* 11.3* 8.7 7.2  HGB 8.8* 8.5* 7.0* 7.0* 7.9*  HCT 30.4* 29.4* 24.2* 24.2* 27.4*  MCV 74.1* 73.5* 74.0* 74.0* 73.5*  PLT 453* 476* 332 360 414*   Microbiology Recent Results (from the past 240 hour(s))  Surgical pcr screen     Status: None   Collection Time: 05/07/15 11:43 AM  Result Value Ref Range Status   MRSA, PCR NEGATIVE NEGATIVE Final   Staphylococcus aureus NEGATIVE NEGATIVE Final    Comment:        The Xpert SA Assay (FDA approved for NASAL specimens in patients over 49 years of age), is one component of a comprehensive surveillance program.  Test performance has been validated by [facility] for patients  greater than or equal to 29 year old. It is not intended to diagnose infection nor to guide or monitor treatment.      Discharge Instructions:   Discharge Instructions    Call MD for:  persistant nausea and vomiting    Complete by:  As directed      Call MD for:  severe uncontrolled pain    Complete by:  As directed      Call MD for:  temperature >100.4    Complete by:  As directed      Diet general    Complete by:  As directed      Face-to-face encounter (required for Medicare/Medicaid patients)    Complete by:  As directed   I Jessly Lebeck certify that this patient is under my care and that I, or a nurse practitioner or physician's assistant working with me, had a face-to-face encounter that meets the physician face-to-face encounter requirements with this patient on 05/11/2015. The encounter with the patient was in whole, or in part for the following medical condition(s) which is the primary reason for home health care (List medical condition): S/P colon cancer resection new ostomy.  Needs ostomy RN to  bring supplies, teach ostomy care.  The encounter with the patient was in whole, or in part, for the following medical condition, which is the primary reason for home health care:  Colon mass s/p resection with new ostomy  I certify that, based on my findings, the following services are medically necessary home health services:  Nursing  Reason for Medically Necessary Home Health Services:   Skilled Nursing- Skilled Assessment/Observation Skilled Nursing- Teaching of Disease Process/Symptom Management Skilled Nursing- Assessment and Observation of Wound Status    My clinical findings support the need for the above services:  OTHER SEE COMMENTS  Further, I certify that my clinical findings support that this patient is homebound due to:  Pain interferes with ambulation/mobility     Home Health    Complete by:  As directed   To provide the following care/treatments:  RN     Increase  activity slowly    Complete by:  As directed             Medication List    STOP taking these medications        ciprofloxacin 500 MG tablet  Commonly known as:  CIPRO     metroNIDAZOLE 500 MG tablet  Commonly known as:  FLAGYL     pantoprazole 40 MG tablet  Commonly known as:  PROTONIX      TAKE these medications        AMBIEN 10 MG tablet  Generic drug:  zolpidem  Take 10 mg by mouth at bedtime as needed for sleep.     HYDROcodone-acetaminophen 5-325 MG per tablet  Commonly known as:  NORCO/VICODIN  Take 1-2 tablets by mouth every 4 (four) hours as needed for moderate pain.     ondansetron 4 MG tablet  Commonly known as:  ZOFRAN  Take 1 tablet (4 mg total) by mouth every 6 (six) hours as needed for nausea.           Follow-up Information    Follow up with [name], MD. Schedule an appointment as soon as possible for a visit in 1 week.   Specialty:  Family Medicine   Why:  Hospital follow up, follow up on pathology results, set up oncology follow up.   Contact information:   [address] [place] [address] [phone]       Follow up with Odis Hollingshead, MD. Schedule an appointment as soon as possible for a visit in 1 week.   Specialty:  General Surgery   Why:  Post surgical follow up.   Contact information:   [place] [address] [phone]        Time coordinating discharge: 35 minutes.  Signed:  Savanah Bayles  Pager [phone] [facility] 05/11/2015, 10:40 AM

## 2015-05-11 NOTE — Discharge Instructions (Signed)
[facility] - DISCHARGE INSTRUCTIONS TO PATIENT  Activity:  Driving - May drive in 3 or 4 days, if doing well   Lifting - No lifting more than 15 pounds for 3 weeks.  Wound Care:   May shower  Diet:  As tolerated  Follow up appointment:  Call Dr. Bertrum Sol office [facility]) at [phone] for an appointment in 1 to 2 weeks.  Call Dr. Zella Richer or his office  [phone]) if you have:  Temperature greater than 100.4,  Persistent nausea and vomiting,  Severe uncontrolled pain,  Redness, tenderness, or signs of infection (pain, swelling, redness, odor or green/yellow discharge around the site),  Any other questions or concerns you may have after discharge.  In an emergency, call 911 or go to an Emergency Department at a nearby hospital.    Colorectal Cancer Colorectal cancer is an abnormal growth of tissue (tumor) in the colon or rectum that is cancerous (malignant). Unlike noncancerous (benign) tumors, malignant tumors can spread to other parts of your body. The colon is the large bowel or large intestine. The rectum is the last several inches of the colon.  RISK FACTORS The exact cause of colorectal cancer is unknown. However, the following factors may increase your chances of getting colorectal cancer:   Age older than 80 years.   Abnormal growths (polyps) on the inner wall of the colon or rectum.   Diabetes.   African American race.   Family history of hereditary nonpolyposis colorectal cancer. This condition is caused by changes in the genes that are responsible for repairing mismatched DNA.   Personal history of cancer. A person who has already had colorectal cancer may develop it a second time. Also, women with a history of ovarian, uterine, or breast cancer are at a somewhat higher risk of developing colorectal cancer.  Certain hereditary conditions.  Eating a diet that is high in fat (especially animal fat) and low in fiber, fruits,  and vegetables.  Sedentary lifestyle.  Inflammatory bowel disease, including ulcerative colitis and Crohn's disease.   Smoking.   Excessive alcohol use.  SYMPTOMS Early colorectal cancer often does not cause symptoms. As the cancer grows, symptoms may include:   Changes in bowel habits.  Diarrhea.   Constipation.   Feeling like the bowel does not empty completely after a bowel movement.   Blood in the stool.   Stools that are narrower than usual.   Abdominal discomfort, pain, bloating, fullness, or cramps.  Frequent gas pain.   Unexplained weight loss.   Constant tiredness.   Nausea and vomiting.  DIAGNOSIS  Your health care provider will ask about your medical history. He or she may also perform a number of procedures, such as:   A physical exam.  A digital rectal exam.  A fecal occult blood test.  A barium enema.  Blood tests.   X-rays.   Imaging tests, such as CT scans or MRIs.   Taking a tissue sample (biopsy) from your colon or rectum to look for cancer cells.   A sigmoidoscopy to view the inside of the last part of your colon.   A colonoscopy to view the inside of your entire colon.   An endorectal ultrasound to see how deep a rectal tumor has grown and whether the cancer has spread to lymph nodes or other nearby tissues.  Your cancer will be staged to determine its severity and extent. Staging is a careful attempt to find out the size of the tumor, whether  the cancer has spread, and if so, to what parts of the body. You may need to have more tests to determine the stage of your cancer. The test results will help determine what treatment plan is best for you.   Stage 0. The cancer is found only in the innermost lining of the colon or rectum.   Stage I. The cancer has grown into the inner wall of the colon or rectum. The cancer has not yet reached the outer wall of the colon.   Stage II. The cancer extends more deeply into or  through the wall of the colon or rectum. It may have invaded nearby tissue, but cancer cells have not spread to the lymph nodes.   Stage III. The cancer has spread to nearby lymph nodes but not to other parts of the body.   Stage IV. The cancer has spread to other parts of the body, such as the liver or lungs.  Your health care provider may tell you the detailed stage of your cancer, which includes both a number and a letter.  TREATMENT  Depending on the type and stage, colorectal cancer may be treated with surgery, radiation therapy, chemotherapy, targeted therapy, or radiofrequency ablation. Some people have a combination of these therapies. Surgery may be done to remove the polyps from your colon. In early stages, your health care provider may be able to do this during a colonoscopy. In later stages, surgery may be done to remove part of your colon.  HOME CARE INSTRUCTIONS   Take medicines only as directed by your health care provider.   Maintain a healthy diet.   Consider joining a support group. This may help you learn to cope with the stress of having colorectal cancer.   Seek advice to help you manage treatment of side effects.   Keep all follow-up visits as directed by your health care provider.   Inform your cancer specialist if you are admitted to the hospital.  SEEK MEDICAL CARE IF:  Your diarrhea or constipation does not go away.   Your bowel habits change.  You have increased abdominal pain.   You notice new fatigue or weakness.  You lose weight. Document Released: 08/30/2005 Document Revised: 01/14/2014 Document Reviewed: 02/22/2013 [facility] Patient Information 2015 [place], [place]. This information is not intended to replace advice given to you by your health care provider. Make sure you discuss any questions you have with your health care provider.  Colostomy Surgery A colostomy surgery is a procedure that redirects a section of the large intestine  (colon) to an opening in the abdomen. This opening is called a stoma or ostomy. A bag is attached to the stoma on the outside of the body. This bag collects waste, since the waste can no longer travel through the rest of the colon. Where the stoma is located and what it looks like depends on the type of colostomy performed. A colostomy may be temporary or permanent. The hospital stay after this procedure is typically 3 to 7 days.  LET YOUR CAREGIVER KNOW ABOUT:  Allergies to food or medicine.  Medicines taken, including vitamins, health supplements, herbs, eyedrops, over-the-counter medicines, and creams.  Use of steroids (by mouth or creams).  Previous problems with anesthetics or numbing medicines.  History of bleeding problems or blood clots.  Previous surgery.  Other health problems, including diabetes and kidney problems.  Possibility of pregnancy, if this applies. RISKS AND COMPLICATIONS General surgical complications may include:  Reaction to anesthesia.  Damage to surrounding nerves, tissues, or structures.  Infection.  Blood clot.  Bleeding.  Scarring.  Pain that lasts longer than 3 months. Specific risks for colostomy, while rare, may include:  Intestinal blockage.  Skin irritation.  Wound opening.  Narrowing or collapsing of the stoma.  Hernia. BEFORE THE PROCEDURE It is important to follow your surgeon's instructions prior to your procedure to avoid complications. Steps before your procedure may include:  A physical exam, blood and urine tests, stool test, X-rays, and other procedures.  Chemotherapy or radiation therapy.  A review of the procedure, the anesthesia being used, and what to expect after the procedure. You may meet with an ostomy advisor. You may be asked to:  Stop taking certain medicines for several days prior to your procedure such as blood thinners (including aspirin).  Take certain medicines, such as antibiotics or stool  softeners.  Follow a special diet for several days prior to the procedure and to avoid eating and drinking after midnight the night before the procedure. This will help you to avoid complications from the anesthesia.  Take an antibacterial shower the night before, or the morning of, the procedure.  Quit smoking. Smoking increases the chances of an infection or a healing problem after your procedure. Arrange for someone to drive you home after surgery. You should also arrange to have someone help you with activities while you recover. PROCEDURE There are several types of colostomy procedures. The 2 main procedure types are loop colostomy or end colostomy. During a loop colostomy, the surgeon pulls 2 ends of the intestine out toward the abdomen, using 2 openings. During an end colostomy, the surgeon pulls 1 end of the intestine out toward the abdomen, through 1 opening. You will be given medicine that makes you sleep (general anesthetic).The procedure may be done as open surgery, with a large cut (incision), or as laparoscopic surgery, with several small incisions.  AFTER THE PROCEDURE  You will be given pain medicine.  You may be able to suck on ice. You may begin drinking clear fluids the next day and begin a normal diet after 2 days, or as directed by your surgeon.  Your stoma will be covered with bandages or a pouch.  Initial drainage from the stoma will be liquid.  The stoma may be dark-colored, swollen, and bruised until it has more time to heal. Document Released: 01/20/2011 Document Revised: 11/22/2011 Document Reviewed: 01/20/2011 [facility] Patient Information 2015 [place], [place]. This information is not intended to replace advice given to you by your health care provider. Make sure you discuss any questions you have with your health care provider.

## 2015-05-11 NOTE — Progress Notes (Signed)
Patient discharged to home with family, discharge instructions reviewed with patient including review with teach back of how to empty and change colostomy bag. Patient verbalized understanding. Ostomy supplies with printed instructions given to patient.

## 2015-05-13 ENCOUNTER — Ambulatory Visit
Admission: RE | Admit: 2015-05-13 | Payer: BLUE CROSS/BLUE SHIELD | Source: Ambulatory Visit | Admitting: Gastroenterology

## 2015-05-13 ENCOUNTER — Encounter: Admission: RE | Payer: Self-pay | Source: Ambulatory Visit

## 2015-05-13 SURGERY — COLONOSCOPY WITH PROPOFOL
Anesthesia: General

## 2015-05-14 ENCOUNTER — Inpatient Hospital Stay: Payer: BLUE CROSS/BLUE SHIELD | Admitting: Oncology

## 2015-05-20 ENCOUNTER — Inpatient Hospital Stay: Payer: BLUE CROSS/BLUE SHIELD | Attending: Oncology | Admitting: Oncology

## 2015-05-20 ENCOUNTER — Inpatient Hospital Stay: Payer: BLUE CROSS/BLUE SHIELD

## 2015-05-20 ENCOUNTER — Encounter: Payer: Self-pay | Admitting: Oncology

## 2015-05-20 VITALS — BP 119/75 | HR 85 | Temp 96.5°F | Wt 297.1 lb

## 2015-05-20 DIAGNOSIS — I10 Essential (primary) hypertension: Secondary | ICD-10-CM | POA: Diagnosis not present

## 2015-05-20 DIAGNOSIS — Z9049 Acquired absence of other specified parts of digestive tract: Secondary | ICD-10-CM | POA: Diagnosis not present

## 2015-05-20 DIAGNOSIS — Z5111 Encounter for antineoplastic chemotherapy: Secondary | ICD-10-CM | POA: Insufficient documentation

## 2015-05-20 DIAGNOSIS — Z79899 Other long term (current) drug therapy: Secondary | ICD-10-CM | POA: Insufficient documentation

## 2015-05-20 DIAGNOSIS — C186 Malignant neoplasm of descending colon: Secondary | ICD-10-CM | POA: Insufficient documentation

## 2015-05-20 DIAGNOSIS — Z933 Colostomy status: Secondary | ICD-10-CM

## 2015-05-20 LAB — CBC WITH DIFFERENTIAL/PLATELET
Basophils Absolute: 0.1 10*3/uL (ref 0–0.1)
Basophils Relative: 1 %
Eosinophils Absolute: 0.2 10*3/uL (ref 0–0.7)
Eosinophils Relative: 3 %
HEMATOCRIT: 30.7 % — AB (ref 40.0–52.0)
HEMOGLOBIN: 9.5 g/dL — AB (ref 13.0–18.0)
LYMPHS ABS: 1.9 10*3/uL (ref 1.0–3.6)
Lymphocytes Relative: 32 %
MCH: 23.2 pg — AB (ref 26.0–34.0)
MCHC: 31 g/dL — AB (ref 32.0–36.0)
MCV: 74.8 fL — ABNORMAL LOW (ref 80.0–100.0)
MONOS PCT: 10 %
Monocytes Absolute: 0.6 10*3/uL (ref 0.2–1.0)
NEUTROS ABS: 3.2 10*3/uL (ref 1.4–6.5)
NEUTROS PCT: 54 %
Platelets: 461 10*3/uL — ABNORMAL HIGH (ref 150–440)
RBC: 4.1 MIL/uL — ABNORMAL LOW (ref 4.40–5.90)
RDW: 28.9 % — ABNORMAL HIGH (ref 11.5–14.5)
WBC: 5.9 10*3/uL (ref 3.8–10.6)

## 2015-05-20 LAB — COMPREHENSIVE METABOLIC PANEL
ALK PHOS: 90 U/L (ref 38–126)
ALT: 47 U/L (ref 17–63)
ANION GAP: 7 (ref 5–15)
AST: 39 U/L (ref 15–41)
Albumin: 3.5 g/dL (ref 3.5–5.0)
BILIRUBIN TOTAL: 0.5 mg/dL (ref 0.3–1.2)
BUN: 10 mg/dL (ref 6–20)
CALCIUM: 8 mg/dL — AB (ref 8.9–10.3)
CO2: 24 mmol/L (ref 22–32)
Chloride: 101 mmol/L (ref 101–111)
Creatinine, Ser: 0.99 mg/dL (ref 0.61–1.24)
Glucose, Bld: 107 mg/dL — ABNORMAL HIGH (ref 65–99)
Potassium: 3.7 mmol/L (ref 3.5–5.1)
Sodium: 132 mmol/L — ABNORMAL LOW (ref 135–145)
TOTAL PROTEIN: 7.5 g/dL (ref 6.5–8.1)

## 2015-05-20 MED ORDER — HYDROCODONE-ACETAMINOPHEN 5-325 MG PO TABS: 1.0000 | ORAL_TABLET | ORAL | Status: DC | PRN

## 2015-05-20 MED ORDER — FENTANYL 12 MCG/HR TD PT72: 12.5000 ug | MEDICATED_PATCH | TRANSDERMAL | Status: DC

## 2015-05-20 NOTE — Progress Notes (Signed)
[facility] @ [facility] [phone]  Fax:([phone] [name]  JAICION LAURIE OB: 08/24/1961  MR#: [id number]  XNT#:[id number]  Patient Care Team: [name], MD as PCP - General (Family Medicine) [name], MD as Consulting Physician (General Surgery)  CHIEF COMPLAINT:  Chief Complaint  Patient presents with  . New Evaluation   1.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 Specimen integrity: Intact. Macroscopic tumor perforation: Not identified. Invasive tumor: Maximum size: 5.5 cm. Histologic type(s): Invasive adenocarcinoma. Histologic grade and differentiation: G1: well differentiated/low grade  Pathologic Staging: pT4b, pN1b, pM1b Ancillary studies: MMR and MSI will be performed. MSI stable -k-Ras pending VISIT DIAGNOSIS:     ICD-9-CM ICD-10-CM   1. 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    Oncology Flowsheet 05/05/2015 05/06/2015 05/06/2015 05/07/2015 05/08/2015 05/09/2015 05/10/2015  dexamethasone (DECADRON) IJ - - - - - - -  enoxaparin (LOVENOX) [place]   40 mg   40 mg 40 mg 40 mg 40 mg  ferumoxytol (FERAHEME) IV - - - - - 510 mg -  ondansetron (ZOFRAN) IJ - - - - - - -  ondansetron (ZOFRAN) IV 4 mg 4 mg 4 mg - 4 mg 4 mg -    INTERVAL HISTORY: 54 year old gentleman is somewhat depressed.  Started having increasing pain in the left lower abdominal area since March of 2016.  Patient was admitted in the hospital CT scan revealed possibility of diverticulitis versus obstructing lesion patient was treated with antibiotics. Colonoscopy was not done.  Patient condition continued to decline had a repeat CT scan in  August of 2016patient then was evaluated by surgeon at [facility] and underwent surgical intervention.. Patient had   Peritoneal  nodules which were resected.  2 lymph nodes were positive.. Continues to have abdominal pain.  Difficulty dealing with colostomy.  Wife is changing colostomy bag Appetite is poor and has lost approximately 40 pounds of weight since  March of 2016 Taking frequent hydrocodone medication Abdominal wound is still healing Stitches to be removed on September 14 Patient is here to discuss possibility of adjuvant treatmentREVIEW OF SYSTEMS:   Review of system: GENERAL:Patient is feeling somewhat depressed.  PERFORMANCE STATUS (ECOG):  01 HEENT:  No visual changes, runny nose, sore throat, mouth sores or tenderness. Lungs: No shortness of breath or cough.  No hemoptysis. Cardiac:  No chest pain, palpitations, orthopnea, or PND. GI:  No nausea, vomiting, diarrhea, constipation, melena or hematochezia. Has colostomy.  Difficulty dealing with colostomy.  Persistent abdominal pain requiring hydrocodone to control pain Musculoskeletal:  No back pain.  No joint pain.  No muscle tenderness. GU: No hematuria. Extremities:  No pain or swelling. Skin:  No rashes or skin changes. Neuro:  No headache, numbness or weakness, balance or coordination issues. Endocrine:  No diabetes, thyroid issues, hot flashes or night sweats. Psych:  No mood changes, depression or anxiety. Pain:  No focal pain. Patient has a history of sarcoidosis diagnosis in 2014 Was given Lyrica for neuropathy.  But patient has discontinued that because of progressive joint pains Review of systems:  All other systems reviewed and found to be negative.  As per HPI. Otherwise, a complete review of systems is negatve.  PAST MEDICAL HISTORY: Past Medical History  Diagnosis Date  . Hypertension   . Sarcoid 10/2012  . Neuropathy   . Obstructive apnea 04/26/2013  . Diverticulitis large intestine  01/13/2015  . Diverticulitis of colon 02/19/2015  . 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 Extreme obesity 12/21/2012  . LBP (low back pain) 05/28/2014  . H/O disease 03/21/2015  . Adiposity 03/21/2015  . PONV (postoperative nausea and vomiting)   . Nephrolithiasis   . Iron deficiency anemia due to chronic blood loss 05/08/2015  . 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 Cardiac catheterization    . Flexible sigmoidoscopy N/A 05/07/2015    Procedure: FLEXIBLE SIGMOIDOSCOPY;  Surgeon: Laurence Spates, MD;  Location: [facility];  Service: Endoscopy;  Laterality: N/A;  . Laparoscopic partial colectomy N/A 05/07/2015    Procedure: LAPAROSCOPIC ASSITED PARTIAL COLECTOMY WITH COLOSTOMY, SMALL BOWEL RESECTION, EXCISION OF PERITONEAL NODULE;  Surgeon: [name], MD;  Location: [facility];  Service: General;  Laterality: N/A;    FAMILY HISTORY Family History  Problem Relation Age of Onset  . Arthritis Mother   . Heart disease Mother   . Stroke Father         ADVANCED DIRECTIVES:  Patient does not have any living will or healthcare power of attorney.  Information was given .  Available resources had been discussed.  We will follow-up on subsequent appointments regarding this issue  HEALTH MAINTENANCE: Social History  Substance Use Topics  . Smoking status: Never Smoker   . Smokeless tobacco: Never Used  . Alcohol Use: No     Colonoscopy:  PAP:  Bone density:  Lipid panel:  Allergies  Allergen Reactions  . Fentanyl Nausea And Vomiting  . Versed [Midazolam] Nausea And Vomiting  . Gabapentin     Swallowing problems  . Paba Derivatives Nausea And Vomiting    Pt states he is allergic to unknown anesthesia. Pt has nausea and vomiting with anesthesia.     Current Outpatient Prescriptions  Medication Sig Dispense Refill    . AMBIEN 10 MG tablet Take 10 mg by mouth at bedtime as needed for sleep.   4  . HYDROcodone-acetaminophen (NORCO/VICODIN) 5-325 MG per tablet Take 1-2 tablets by mouth every 4 (four) hours as needed for moderate pain. 40 tablet 0  . ondansetron (ZOFRAN) 4 MG tablet Take 1 tablet (4 mg total) by mouth every 6 (six) hours as needed for nausea. 20 tablet 0  . fentaNYL (DURAGESIC - DOSED MCG/HR) 12 MCG/HR Place 1 patch (12.5 mcg total) onto the skin every 3 (three) days. 10 patch 0   No current facility-administered medications for this visit.    OBJECTIVE: PHYSICAL EXAM: GENERAL:  Well developed, well nourished, Patient is in somewhat pain and in distress. MENTAL STATUS:  Alert and oriented to person, place and time.   RESPIRATORY:  Clear to auscultation without rales, wheezes or rhonchi. CARDIOVASCULAR:  Regular rate and rhythm without murmur, rub or gallop.  ABDOMEN:  Soft, non-tender, with active bowel sounds, and no hepatosplenomegaly.  No masses. Wound is healing well Colostomy functioning well BACK:  No CVA tenderness.  No tenderness on percussion of the back or rib cage. SKIN:  No rashes, ulcers or lesions. EXTREMITIES: No edema, no skin discoloration or tenderness.  No palpable cords. LYMPH  NODES: No palpable cervical, supraclavicular, axillary or inguinal adenopathy  NEUROLOGICAL: Unremarkable. PSYCH:  depression  Filed Vitals:   05/20/15 1523  BP: 119/75  Pulse: 85  Temp: 96.5 F (35.8 C)     Body mass index is 41.46 kg/(m^2).    ECOG FS:1 - Symptomatic but completely ambulatory  LAB RESULTS:  Appointment on 05/20/2015  Component Date Value Ref Range Status  . WBC 05/20/2015 5.9  3.8 - 10.6 K/uL Final  . RBC 05/20/2015 4.10* 4.40 - 5.90 MIL/uL Final  . Hemoglobin 05/20/2015 9.5* 13.0 - 18.0 g/dL Final  . HCT 05/20/2015 30.7* 40.0 - 52.0 % Final  . MCV 05/20/2015 74.8* 80.0 - 100.0 fL Final  . MCH 05/20/2015 23.2* 26.0 - 34.0 pg Final  . MCHC 05/20/2015 31.0* 32.0  - 36.0 g/dL Final  . RDW 05/20/2015 28.9* 11.5 - 14.5 % Final  . Platelets 05/20/2015 461* 150 - 440 K/uL Final  . Neutrophils Relative % 05/20/2015 54   Final  . Neutro Abs 05/20/2015 3.2  1.4 - 6.5 K/uL Final  . Lymphocytes Relative 05/20/2015 32   Final  . Lymphs Abs 05/20/2015 1.9  1.0 - 3.6 K/uL Final  . Monocytes Relative 05/20/2015 10   Final  . Monocytes Absolute 05/20/2015 0.6  0.2 - 1.0 K/uL Final  . Eosinophils Relative 05/20/2015 3   Final  . Eosinophils Absolute 05/20/2015 0.2  0 - 0.7 K/uL Final  . Basophils Relative 05/20/2015 1   Final  . Basophils Absolute 05/20/2015 0.1  0 - 0.1 K/uL Final  . Sodium 05/20/2015 132* 135 - 145 mmol/L Final  . Potassium 05/20/2015 3.7  3.5 - 5.1 mmol/L Final  . Chloride 05/20/2015 101  101 - 111 mmol/L Final  . CO2 05/20/2015 24  22 - 32 mmol/L Final  . Glucose, Bld 05/20/2015 107* 65 - 99 mg/dL Final  . BUN 05/20/2015 10  6 - 20 mg/dL Final  . Creatinine, Ser 05/20/2015 0.99  0.61 - 1.24 mg/dL Final  . Calcium 05/20/2015 8.0* 8.9 - 10.3 mg/dL Final  . Total Protein 05/20/2015 7.5  6.5 - 8.1 g/dL Final  . Albumin 05/20/2015 3.5  3.5 - 5.0 g/dL Final  . AST 05/20/2015 39  15 - 41 U/L Final  . ALT 05/20/2015 47  17 - 63 U/L Final  . Alkaline Phosphatase 05/20/2015 90  38 - 126 U/L Final  . Total Bilirubin 05/20/2015 0.5  0.3 - 1.2 mg/dL Final  . GFR calc non Af Amer 05/20/2015 >60  >60 mL/min Final  . GFR calc Af Amer 05/20/2015 >60  >60 mL/min Final   Comment: (NOTE) The eGFR has been calculated using the CKD EPI equation. This calculation has not been validated in all clinical situations. eGFR's persistently <60 mL/min signify possible Chronic Kidney Disease.   . Anion gap 05/20/2015 7  5 - 15 Final     STUDIES: Ct Abdomen Pelvis W Contrast  05/05/2015   CLINICAL DATA:  Acute generalized abdominal pain.  EXAM: CT ABDOMEN AND PELVIS WITH CONTRAST  TECHNIQUE: Multidetector CT imaging of the abdomen and pelvis was performed using  the standard protocol following bolus administration of intravenous contrast.  CONTRAST:  1101m OMNIPAQUE IOHEXOL 300 MG/ML  SOLN  COMPARISON:  None.  FINDINGS: Visualized lung bases appear normal. No significant osseous abnormality is noted.  No gallstones are noted. The liver, spleen and pancreas appear normal. Adrenal glands appear normal. Bilateral nonobstructive nephrolithiasis is noted. No hydronephrosis or renal obstruction is noted. No  ureteral calculi are noted. There appears to be moderate to severe dilatation of the right colon, transverse colon and proximal descending colon with persistent narrowing seen in the distal portion of the descending colon. There appears to be a soft tissue mass in this area concerning for possible neoplasm. This measures 3.4 x 3.1 cm. It appears to extend to an adjacent loop of small bowel, but does not appear to cause small bowel obstruction. Urinary bladder appears normal. No significant adenopathy is noted. Mass is noted along the anterior portion the peritoneal measuring 29 x 11 mm concerning for carcinomatosis, best seen on image number 61 of series 2.  IMPRESSION: Bilateral nonobstructive nephrolithiasis.  Obstruction of the ascending, transverse and proximal descending colon is noted secondary to probable mass in distal portion of descending colon. This soft tissue abnormality appears to extend to adjacent loop of small bowel. 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 Colonoscopy is recommended to evaluate for possible colonic malignancy.   Electronically Signed   By: Marijo Conception, M.D.   On: 05/05/2015 16:14    ASSESSMENT:  Carcinoma of distal descending colon T4 b(small bowel involvement) N1 B (2 of 15 lymph nodes positive)   M1 B2 tumor(  Peritoneal nodules) 2.sarcoidosis with involvement of mediastinal lymph node and axillary lymph node by history. 3. No significant family  history of any colon cancer or any other malignancy 4. Preop CEA was less than 1 5.Preop CT scan has been reviewed does not show any evidence of liver metastases 6. Patient did not have colonoscopy 7. MSI stable 8. K-ras has been ordered   PLAN:   I had detailed discussion with patient and family All the records from the [facility] as well as from CT scan from local hospital has been reviewed Possibility of adjuvant chemotherapy with  FOLFOX  Is being discussed. Patient would need port placement can be done by his surgeon at [facility] or by vascular surgeon locally. Patient would attend  A chemotherapy class For pain control be started Patient on fentanyl patch with oxycodone breakthrough pain medication Patient was advised to gradually get off oxycodone followed by discontinuing fentanyl patch All the side effects of chemotherapy including myelosuppression, alopecia, nausea vomiting fatigue weakness.  Secondary infection, and   peripheral neuropathy .  Has been discussed in details. Informal consent has been obtained and will be documented by nurses in the chart Patient was explained all the side effects of chemotherapy.  And informed consent   Will be  Obtained. Patient may need antidepressant medication like Cymbalta or Celexa Recommended colostomy nurse evaluation for him to deal with colostomy  somewhat better.  Appointment has been made.     Patient expressed understanding and was in agreement with this plan. He also understands that He can call clinic at any time with any questions, concerns, or complaints.    No matching staging information was found for the patient.  Forest Gleason, MD   05/20/2015 4:49 PM

## 2015-05-20 NOTE — Progress Notes (Signed)
Patient does have living will.  Former smoker.  Patient here today for evaluation regarding colon cancer.  Referred by Dr. Kary Kos.  Patient needs prescription for Norco.  States he has  Abdominal pain 7/10.

## 2015-05-20 NOTE — Progress Notes (Signed)
  Oncology Nurse Navigator Documentation  Referral date to RadOnc/MedOnc: 05/20/15 (05/20/15 1600) Navigator Encounter Type: Initial MedOnc (05/20/15 1600) Patient Visit Type: Medonc (05/20/15 1600)                    Time Spent with Patient: 60 (05/20/15 1600)   Will facilitate getting KRAS done

## 2015-05-21 ENCOUNTER — Telehealth: Payer: Self-pay

## 2015-05-21 ENCOUNTER — Other Ambulatory Visit: Payer: Self-pay | Admitting: Oncology

## 2015-05-21 LAB — CEA: CEA: 1.1 ng/mL (ref 0.0–4.7)

## 2015-05-21 NOTE — Patient Instructions (Signed)

## 2015-05-21 NOTE — Telephone Encounter (Signed)
  Oncology Nurse Navigator Documentation    Navigator Encounter Type: Telephone (05/21/15 0900)                      Time Spent with Patient: 30 (05/21/15 0900)   Pathology at [facility] called and extended KRAS to be sent out today. Results will be back in 7-14 days. [place]  called and office to notify Dr Zella Richer to see if he would like to place port a cath or if he would like [place] to have it placed locally. Plan is to start chemo 9/19. They will return call and let us know.

## 2015-05-22 ENCOUNTER — Telehealth: Payer: Self-pay

## 2015-05-22 ENCOUNTER — Other Ambulatory Visit (HOSPITAL_COMMUNITY)
Admission: RE | Admit: 2015-05-22 | Discharge: 2015-05-22 | Disposition: A | Discharge status: Home / Self Care | Payer: BLUE CROSS/BLUE SHIELD | Source: Ambulatory Visit | Attending: Oncology | Admitting: Oncology

## 2015-05-22 ENCOUNTER — Ambulatory Visit: Payer: BLUE CROSS/BLUE SHIELD

## 2015-05-22 DIAGNOSIS — C186 Malignant neoplasm of descending colon: Secondary | ICD-10-CM | POA: Diagnosis present

## 2015-05-22 NOTE — Telephone Encounter (Signed)
  Oncology Nurse Navigator Documentation    Navigator Encounter Type: Telephone (05/22/15 1000)                      Time Spent with Patient: 15 (05/22/15 1000)   Not feeling well today. Chemo class rescheduled for 9/15. Voicemail left regarding. Also have not received call back from Dr Zella Richer office regarding port placement. Need to find out if they have called him or I need to recall them regarding or schedule local.

## 2015-05-22 NOTE — Progress Notes (Signed)
Please put orders in Epic surgery 05-27-15 pre op 05-26-15 Thanks

## 2015-05-23 ENCOUNTER — Encounter: Payer: Self-pay | Admitting: Oncology

## 2015-05-23 ENCOUNTER — Encounter: Payer: Self-pay | Admitting: General Surgery

## 2015-05-23 ENCOUNTER — Other Ambulatory Visit: Payer: Self-pay | Admitting: General Surgery

## 2015-05-23 NOTE — Patient Instructions (Addendum)
YOUR PROCEDURE IS SCHEDULED ON :  05/27/15  REPORT TO [place] MAIN ENTRANCE FOLLOW SIGNS TO EAST ELEVATOR - GO TO 3rd FLOOR CHECK IN AT 3 EAST NURSES STATION (SHORT STAY) AT: 12:15 PM  CALL THIS NUMBER IF YOU HAVE PROBLEMS THE MORNING OF SURGERY [phone]  REMEMBER:ONLY 1 PER PERSON MAY GO TO SHORT STAY WITH YOU TO GET READY THE MORNING OF YOUR SURGERY  DO NOT EAT FOOD  AFTER MIDNIGHT  MAY HAVE CLEAR LIQUIDS UNTIL 8:15 AM  TAKE THESE MEDICINES THE MORNING OF SURGERY:  MAY TAKE HYDROCODONE IF NEEDED  STOP ASPIRIN / IBUPROFEN / ALEVE / VITAMINS / HERBAL MEDS __7__ DAYS BEFORE SURGERY  YOU MAY NOT HAVE ANY METAL ON YOUR BODY INCLUDING HAIR PINS AND PIERCING'S. DO NOT WEAR JEWELRY, MAKEUP, LOTIONS, POWDERS OR PERFUMES. DO NOT WEAR NAIL POLISH. DO NOT SHAVE 48 HRS PRIOR TO SURGERY. MEN MAY SHAVE FACE AND NECK.  DO NOT [place]. [facility] IS NOT RESPONSIBLE FOR VALUABLES.  CONTACTS, DENTURES OR PARTIALS MAY NOT BE WORN TO SURGERY. LEAVE SUITCASE IN CAR. CAN BE BROUGHT TO ROOM AFTER SURGERY.  PATIENTS DISCHARGED THE DAY OF SURGERY WILL NOT BE ALLOWED TO DRIVE HOME.  PLEASE READ OVER THE FOLLOWING INSTRUCTION SHEETS _________________________________________________________________________________                                          [facility] - PREPARING FOR SURGERY  Before surgery, you can play an important role.  Because skin is not sterile, your skin needs to be as free of germs as possible.  You can reduce the number of germs on your skin by washing with CHG (chlorahexidine gluconate) soap before surgery.  CHG is an antiseptic cleaner which kills germs and bonds with the skin to continue killing germs even after washing. Please DO NOT use if you have an allergy to CHG or antibacterial soaps.  If your skin becomes reddened/irritated stop using the CHG and inform your nurse when you arrive at Short Stay. Do not shave (including legs  and underarms) for at least 48 hours prior to the first CHG shower.  You may shave your face. Please follow these instructions carefully:   1.  Shower with CHG Soap the night before surgery and the  morning of Surgery.   2.  If you choose to wash your hair, wash your hair first as usual with your  normal  Shampoo.   3.  After you shampoo, rinse your hair and body thoroughly to remove the  shampoo.                                         4.  Use CHG as you would any other liquid soap.  You can apply chg directly  to the skin and wash . Gently wash with scrungie or clean wascloth    5.  Apply the CHG Soap to your body ONLY FROM THE NECK DOWN.   Do not use on open                           Wound or open sores. Avoid contact with eyes, ears mouth and genitals (private parts).  Genitals (private parts) with your normal soap.              6.  Wash thoroughly, paying special attention to the area where your surgery  will be performed.   7.  Thoroughly rinse your body with warm water from the neck down.   8.  DO NOT shower/wash with your normal soap after using and rinsing off  the CHG Soap .                9.  Pat yourself dry with a clean towel.             10.  Wear clean night clothes to bed after shower             11.  Place clean sheets on your bed the night of your first shower and do not  sleep with pets.  Day of Surgery : Do not apply any lotions/deodorants the morning of surgery.  Please wear clean clothes to the hospital/surgery center.  FAILURE TO FOLLOW THESE INSTRUCTIONS MAY RESULT IN THE CANCELLATION OF YOUR SURGERY    PATIENT SIGNATURE_________________________________  ______________________________________________________________________             CLEAR LIQUID DIET   Foods Allowed                                                                     Foods Excluded  Coffee and tea, regular and decaf                              liquids that you cannot  Plain Jell-O in any flavor                                             see through such as: Fruit ices (not with fruit pulp)                                     milk, soups, orange juice  Iced Popsicles                                                 All solid food Carbonated beverages, regular and diet                                    Cranberry, grape and apple juices Sports drinks like Gatorade Lightly seasoned clear broth or consume(fat free) Sugar, honey syrup  _____________________________________________________________________

## 2015-05-23 NOTE — Progress Notes (Signed)
His oncologist wants to start him on chemotherapy and request that he have a Port-A-Cath.  I called him and discuss this with him. The procedure risks and aftercare been explained. Risks include but are not limited to bleeding, infection, malfunction, pneumothorax, wound problems, DVT.

## 2015-05-26 ENCOUNTER — Encounter (INDEPENDENT_AMBULATORY_CARE_PROVIDER_SITE_OTHER): Payer: Self-pay

## 2015-05-26 ENCOUNTER — Encounter (HOSPITAL_COMMUNITY)
Admission: RE | Admit: 2015-05-26 | Discharge: 2015-05-26 | Disposition: A | Discharge status: Home / Self Care | Payer: BLUE CROSS/BLUE SHIELD | Source: Ambulatory Visit | Attending: General Surgery | Admitting: General Surgery

## 2015-05-26 ENCOUNTER — Encounter (HOSPITAL_COMMUNITY): Payer: Self-pay

## 2015-05-26 DIAGNOSIS — C801 Malignant (primary) neoplasm, unspecified: Secondary | ICD-10-CM | POA: Diagnosis not present

## 2015-05-26 DIAGNOSIS — Z85828 Personal history of other malignant neoplasm of skin: Secondary | ICD-10-CM | POA: Diagnosis not present

## 2015-05-26 DIAGNOSIS — G4730 Sleep apnea, unspecified: Secondary | ICD-10-CM | POA: Diagnosis not present

## 2015-05-26 DIAGNOSIS — Z79899 Other long term (current) drug therapy: Secondary | ICD-10-CM | POA: Diagnosis not present

## 2015-05-26 DIAGNOSIS — Z7722 Contact with and (suspected) exposure to environmental tobacco smoke (acute) (chronic): Secondary | ICD-10-CM | POA: Diagnosis not present

## 2015-05-26 DIAGNOSIS — I10 Essential (primary) hypertension: Secondary | ICD-10-CM | POA: Diagnosis not present

## 2015-05-26 DIAGNOSIS — C189 Malignant neoplasm of colon, unspecified: Secondary | ICD-10-CM | POA: Diagnosis not present

## 2015-05-26 DIAGNOSIS — Z79891 Long term (current) use of opiate analgesic: Secondary | ICD-10-CM | POA: Diagnosis not present

## 2015-05-26 DIAGNOSIS — Z933 Colostomy status: Secondary | ICD-10-CM | POA: Diagnosis not present

## 2015-05-26 DIAGNOSIS — K219 Gastro-esophageal reflux disease without esophagitis: Secondary | ICD-10-CM | POA: Diagnosis not present

## 2015-05-26 DIAGNOSIS — Z9049 Acquired absence of other specified parts of digestive tract: Secondary | ICD-10-CM | POA: Diagnosis not present

## 2015-05-26 DIAGNOSIS — G709 Myoneural disorder, unspecified: Secondary | ICD-10-CM | POA: Diagnosis not present

## 2015-05-26 DIAGNOSIS — Z6841 Body Mass Index (BMI) 40.0 and over, adult: Secondary | ICD-10-CM | POA: Diagnosis not present

## 2015-05-26 DIAGNOSIS — Z452 Encounter for adjustment and management of vascular access device: Secondary | ICD-10-CM | POA: Diagnosis present

## 2015-05-26 HISTORY — DX: Personal history of other medical treatment: Z92.89

## 2015-05-26 HISTORY — DX: Gastro-esophageal reflux disease without esophagitis: K21.9

## 2015-05-26 HISTORY — DX: Unspecified malignant neoplasm of skin, unspecified: C44.90

## 2015-05-26 HISTORY — DX: Unspecified abdominal pain: R10.9

## 2015-05-26 HISTORY — DX: Personal history of urinary calculi: Z87.442

## 2015-05-26 HISTORY — DX: Unspecified osteoarthritis, unspecified site: M19.90

## 2015-05-26 HISTORY — DX: Malignant neoplasm of colon, unspecified: C18.9

## 2015-05-26 LAB — BASIC METABOLIC PANEL
Anion gap: 8 (ref 5–15)
BUN: 10 mg/dL (ref 6–20)
CHLORIDE: 106 mmol/L (ref 101–111)
CO2: 26 mmol/L (ref 22–32)
CREATININE: 0.99 mg/dL (ref 0.61–1.24)
Calcium: 8.9 mg/dL (ref 8.9–10.3)
GFR calc non Af Amer: >60 mL/min (ref >60)
Glucose, Bld: 91 mg/dL (ref 65–99)
POTASSIUM: 3.8 mmol/L (ref 3.5–5.1)
Sodium: 140 mmol/L (ref 135–145)

## 2015-05-26 LAB — CBC
HEMATOCRIT: 33.5 % — AB (ref 39.0–52.0)
HEMOGLOBIN: 9.8 g/dL — AB (ref 13.0–17.0)
MCH: 22.8 pg — ABNORMAL LOW (ref 26.0–34.0)
MCHC: 29.3 g/dL — AB (ref 30.0–36.0)
MCV: 77.9 fL — ABNORMAL LOW (ref 78.0–100.0)
Platelets: 504 10*3/uL — ABNORMAL HIGH (ref 150–400)
RBC: 4.3 MIL/uL (ref 4.22–5.81)
RDW: 25.5 % — ABNORMAL HIGH (ref 11.5–15.5)
WBC: 6.8 10*3/uL (ref 4.0–10.5)

## 2015-05-26 MED ORDER — DEXTROSE 5 % IV SOLN
3.0000 g | INTRAVENOUS | Status: AC
  Administered 2015-05-27: 3 g via INTRAVENOUS
  Filled 2015-05-26 (×2): qty 3000

## 2015-05-26 NOTE — Progress Notes (Signed)
   05/26/15 1422  OBSTRUCTIVE SLEEP APNEA  Have you ever been diagnosed with sleep apnea through a sleep study? No  Do you snore loudly (loud enough to be heard through closed doors)?  0  Do you often feel tired, fatigued, or sleepy during the daytime (such as falling asleep during driving or talking to someone)? 1  Has anyone observed you stop breathing during your sleep? 0  Do you have, or are you being treated for high blood pressure? 0  BMI more than 35 kg/m2? 1  Age > 50 (1-yes) 1  Neck circumference greater than:Male 16 inches or larger, Male 17inches or larger? 1  Male Gender (Yes=1) 1  Obstructive Sleep Apnea Score 5  Score 5 or greater  Results sent to PCP

## 2015-05-27 ENCOUNTER — Encounter (HOSPITAL_COMMUNITY): Payer: Self-pay

## 2015-05-27 ENCOUNTER — Ambulatory Visit (HOSPITAL_COMMUNITY): Payer: BLUE CROSS/BLUE SHIELD

## 2015-05-27 ENCOUNTER — Ambulatory Visit (HOSPITAL_COMMUNITY): Payer: BLUE CROSS/BLUE SHIELD | Admitting: Certified Registered Nurse Anesthetist

## 2015-05-27 ENCOUNTER — Ambulatory Visit (HOSPITAL_COMMUNITY)
Admission: RE | Admit: 2015-05-27 | Discharge: 2015-05-27 | Disposition: A | Discharge status: Home / Self Care | Payer: BLUE CROSS/BLUE SHIELD | Source: Ambulatory Visit | Attending: General Surgery | Admitting: General Surgery

## 2015-05-27 ENCOUNTER — Encounter (HOSPITAL_COMMUNITY)
Admission: RE | Disposition: A | Discharge status: Home / Self Care | Payer: Self-pay | Source: Ambulatory Visit | Attending: General Surgery

## 2015-05-27 DIAGNOSIS — I10 Essential (primary) hypertension: Secondary | ICD-10-CM | POA: Insufficient documentation

## 2015-05-27 DIAGNOSIS — Z452 Encounter for adjustment and management of vascular access device: Secondary | ICD-10-CM | POA: Diagnosis not present

## 2015-05-27 DIAGNOSIS — Z79899 Other long term (current) drug therapy: Secondary | ICD-10-CM | POA: Insufficient documentation

## 2015-05-27 DIAGNOSIS — K219 Gastro-esophageal reflux disease without esophagitis: Secondary | ICD-10-CM | POA: Insufficient documentation

## 2015-05-27 DIAGNOSIS — Z85828 Personal history of other malignant neoplasm of skin: Secondary | ICD-10-CM | POA: Insufficient documentation

## 2015-05-27 DIAGNOSIS — C801 Malignant (primary) neoplasm, unspecified: Secondary | ICD-10-CM | POA: Insufficient documentation

## 2015-05-27 DIAGNOSIS — Z6841 Body Mass Index (BMI) 40.0 and over, adult: Secondary | ICD-10-CM | POA: Insufficient documentation

## 2015-05-27 DIAGNOSIS — G709 Myoneural disorder, unspecified: Secondary | ICD-10-CM | POA: Insufficient documentation

## 2015-05-27 DIAGNOSIS — Z933 Colostomy status: Secondary | ICD-10-CM | POA: Insufficient documentation

## 2015-05-27 DIAGNOSIS — C189 Malignant neoplasm of colon, unspecified: Secondary | ICD-10-CM | POA: Insufficient documentation

## 2015-05-27 DIAGNOSIS — Z95828 Presence of other vascular implants and grafts: Secondary | ICD-10-CM

## 2015-05-27 DIAGNOSIS — Z7722 Contact with and (suspected) exposure to environmental tobacco smoke (acute) (chronic): Secondary | ICD-10-CM | POA: Insufficient documentation

## 2015-05-27 DIAGNOSIS — G4730 Sleep apnea, unspecified: Secondary | ICD-10-CM | POA: Insufficient documentation

## 2015-05-27 DIAGNOSIS — Z79891 Long term (current) use of opiate analgesic: Secondary | ICD-10-CM | POA: Insufficient documentation

## 2015-05-27 DIAGNOSIS — Z9049 Acquired absence of other specified parts of digestive tract: Secondary | ICD-10-CM | POA: Insufficient documentation

## 2015-05-27 HISTORY — PX: PORTACATH PLACEMENT: SHX2246

## 2015-05-27 SURGERY — INSERTION, TUNNELED CENTRAL VENOUS DEVICE, WITH PORT
Anesthesia: Monitor Anesthesia Care | Site: Chest | Laterality: Right

## 2015-05-27 MED ORDER — MIDAZOLAM HCL 5 MG/5ML IJ SOLN
INTRAMUSCULAR | Status: DC | PRN
  Administered 2015-05-27: 2 mg via INTRAVENOUS
  Administered 2015-05-27: 1 mg via INTRAVENOUS

## 2015-05-27 MED ORDER — SODIUM CHLORIDE 0.9 % IR SOLN
Freq: Once | Status: AC
  Administered 2015-05-27: 500 mL
  Filled 2015-05-27: qty 1.2

## 2015-05-27 MED ORDER — LIDOCAINE HCL (CARDIAC) 20 MG/ML IV SOLN
INTRAVENOUS | Status: AC
  Filled 2015-05-27: qty 5

## 2015-05-27 MED ORDER — SODIUM CHLORIDE 0.9 % IJ SOLN: 3.0000 mL | INTRAMUSCULAR | Status: DC | PRN

## 2015-05-27 MED ORDER — PROPOFOL 10 MG/ML IV BOLUS
INTRAVENOUS | Status: AC
  Filled 2015-05-27: qty 20

## 2015-05-27 MED ORDER — HEPARIN SOD (PORK) LOCK FLUSH 100 UNIT/ML IV SOLN
INTRAVENOUS | Status: DC | PRN
  Administered 2015-05-27: 500 [IU] via INTRAVENOUS

## 2015-05-27 MED ORDER — HYDROMORPHONE HCL 1 MG/ML IJ SOLN
0.2500 mg | INTRAMUSCULAR | Status: DC | PRN
  Administered 2015-05-27 (×2): 0.5 mg via INTRAVENOUS

## 2015-05-27 MED ORDER — ACETAMINOPHEN 650 MG RE SUPP: 650.0000 mg | RECTAL | Status: DC | PRN

## 2015-05-27 MED ORDER — PROPOFOL INFUSION 10 MG/ML OPTIME
INTRAVENOUS | Status: DC | PRN
  Administered 2015-05-27: 100 ug/kg/min via INTRAVENOUS

## 2015-05-27 MED ORDER — BUPIVACAINE-EPINEPHRINE (PF) 0.25% -1:200000 IJ SOLN
INTRAMUSCULAR | Status: AC
  Filled 2015-05-27: qty 30

## 2015-05-27 MED ORDER — FENTANYL CITRATE (PF) 100 MCG/2ML IJ SOLN
INTRAMUSCULAR | Status: AC
  Filled 2015-05-27: qty 4

## 2015-05-27 MED ORDER — LIDOCAINE HCL (CARDIAC) 20 MG/ML IV SOLN
INTRAVENOUS | Status: DC | PRN
Start: 2015-05-27 — End: 2015-05-27
  Administered 2015-05-27: 50 mg via INTRAVENOUS

## 2015-05-27 MED ORDER — MIDAZOLAM HCL 2 MG/2ML IJ SOLN
INTRAMUSCULAR | Status: AC
  Filled 2015-05-27: qty 4

## 2015-05-27 MED ORDER — ONDANSETRON HCL 4 MG/2ML IJ SOLN
INTRAMUSCULAR | Status: DC | PRN
  Administered 2015-05-27: 4 mg via INTRAVENOUS

## 2015-05-27 MED ORDER — ONDANSETRON HCL 4 MG/2ML IJ SOLN
INTRAMUSCULAR | Status: AC
  Filled 2015-05-27: qty 2

## 2015-05-27 MED ORDER — HEPARIN SODIUM (PORCINE) 5000 UNIT/ML IJ SOLN
INTRAMUSCULAR | Status: DC | PRN
  Administered 2015-05-27: 15:00:00

## 2015-05-27 MED ORDER — FENTANYL CITRATE (PF) 100 MCG/2ML IJ SOLN
INTRAMUSCULAR | Status: DC | PRN
  Administered 2015-05-27: 25 ug via INTRAVENOUS

## 2015-05-27 MED ORDER — OXYCODONE HCL 5 MG PO TABS: 5.0000 mg | ORAL_TABLET | ORAL | Status: DC | PRN

## 2015-05-27 MED ORDER — HYDROMORPHONE HCL 1 MG/ML IJ SOLN
INTRAMUSCULAR | Status: AC
  Filled 2015-05-27: qty 1

## 2015-05-27 MED ORDER — LIDOCAINE-EPINEPHRINE 1 %-1:100000 IJ SOLN
INTRAMUSCULAR | Status: DC | PRN
Start: 2015-05-27 — End: 2015-05-27
  Administered 2015-05-27: 8 mL

## 2015-05-27 MED ORDER — HEPARIN SOD (PORK) LOCK FLUSH 100 UNIT/ML IV SOLN
INTRAVENOUS | Status: AC
  Filled 2015-05-27: qty 5

## 2015-05-27 MED ORDER — ACETAMINOPHEN 325 MG PO TABS: 650.0000 mg | ORAL_TABLET | ORAL | Status: DC | PRN

## 2015-05-27 MED ORDER — LACTATED RINGERS IV SOLN
INTRAVENOUS | Status: DC
  Administered 2015-05-27: 1000 mL via INTRAVENOUS
  Administered 2015-05-27: 16:00:00 via INTRAVENOUS

## 2015-05-27 MED ORDER — PROMETHAZINE HCL 25 MG/ML IJ SOLN: 6.2500 mg | INTRAMUSCULAR | Status: DC | PRN

## 2015-05-27 SURGICAL SUPPLY — 42 items
BAG DECANTER FOR FLEXI CONT (MISCELLANEOUS) ×3 IMPLANT
BENZOIN TINCTURE PRP APPL 2/3 (GAUZE/BANDAGES/DRESSINGS) ×3 IMPLANT
BLADE HEX COATED 2.75 (ELECTRODE) ×3 IMPLANT
BLADE SURG 15 STRL LF DISP TIS (BLADE) ×1 IMPLANT
BLADE SURG 15 STRL SS (BLADE) ×2
BLADE SURG SZ11 CARB STEEL (BLADE) ×3 IMPLANT
CHLORAPREP W/TINT 26ML (MISCELLANEOUS) ×3 IMPLANT
CLOSURE WOUND 1/2 X4 (GAUZE/BANDAGES/DRESSINGS) ×1
COVER SURGICAL LIGHT HANDLE (MISCELLANEOUS) ×3 IMPLANT
DECANTER SPIKE VIAL GLASS SM (MISCELLANEOUS) ×3 IMPLANT
DRAPE C-ARM 42X120 X-RAY (DRAPES) ×3 IMPLANT
DRAPE LAPAROSCOPIC ABDOMINAL (DRAPES) ×3 IMPLANT
DRSG TEGADERM 2-3/8X2-3/4 SM (GAUZE/BANDAGES/DRESSINGS) IMPLANT
DRSG TEGADERM 4X4.75 (GAUZE/BANDAGES/DRESSINGS) IMPLANT
ELECT PENCIL ROCKER SW 15FT (MISCELLANEOUS) ×3 IMPLANT
ELECT REM PT RETURN 9FT ADLT (ELECTROSURGICAL) ×3
ELECTRODE REM PT RTRN 9FT ADLT (ELECTROSURGICAL) ×1 IMPLANT
GAUZE SPONGE 2X2 8PLY STRL LF (GAUZE/BANDAGES/DRESSINGS) ×1 IMPLANT
GAUZE SPONGE 4X4 12PLY STRL (GAUZE/BANDAGES/DRESSINGS) ×3 IMPLANT
GAUZE SPONGE 4X4 16PLY XRAY LF (GAUZE/BANDAGES/DRESSINGS) IMPLANT
GLOVE ECLIPSE 8.0 STRL XLNG CF (GLOVE) ×3 IMPLANT
GLOVE INDICATOR 8.0 STRL GRN (GLOVE) ×3 IMPLANT
GOWN STRL REUS W/TWL XL LVL3 (GOWN DISPOSABLE) ×6 IMPLANT
KIT BASIN OR (CUSTOM PROCEDURE TRAY) ×3 IMPLANT
KIT PORT POWER 8FR ISP CVUE (Catheter) ×3 IMPLANT
NEEDLE HYPO 25X1 1.5 SAFETY (NEEDLE) ×3 IMPLANT
NS IRRIG 1000ML POUR BTL (IV SOLUTION) ×3 IMPLANT
PACK BASIC VI WITH GOWN DISP (CUSTOM PROCEDURE TRAY) ×3 IMPLANT
SLEEVE SURGEON STRL (DRAPES) ×3 IMPLANT
SPONGE GAUZE 2X2 STER 10/PKG (GAUZE/BANDAGES/DRESSINGS) ×2
STRIP CLOSURE SKIN 1/2X4 (GAUZE/BANDAGES/DRESSINGS) ×2 IMPLANT
SUT MNCRL AB 4-0 PS2 18 (SUTURE) ×3 IMPLANT
SUT VIC AB 2-0 SH 18 (SUTURE) ×3 IMPLANT
SUT VIC AB 2-0 SH 27 (SUTURE)
SUT VIC AB 2-0 SH 27X BRD (SUTURE) IMPLANT
SUT VIC AB 3-0 SH 27 (SUTURE) ×2
SUT VIC AB 3-0 SH 27XBRD (SUTURE) ×1 IMPLANT
SYR 20CC LL (SYRINGE) ×3 IMPLANT
SYRINGE 10CC LL (SYRINGE) ×3 IMPLANT
TAPE CLOTH SURG 4X10 WHT LF (GAUZE/BANDAGES/DRESSINGS) ×3 IMPLANT
TOWEL OR 17X26 10 PK STRL BLUE (TOWEL DISPOSABLE) ×3 IMPLANT
TOWEL OR NON WOVEN STRL DISP B (DISPOSABLE) ×3 IMPLANT

## 2015-05-27 NOTE — Anesthesia Postprocedure Evaluation (Signed)
  Anesthesia Post-op Note  Patient: [name]  Procedure(s) Performed: Procedure(s): INSERTION PORT-A-CATH (Right)  Patient Location: PACU  Anesthesia Type:MAC  Level of Consciousness: awake and alert   Airway and Oxygen Therapy: Patient Spontanous Breathing  Post-op Pain: none  Post-op Assessment: Post-op Vital signs reviewed              Post-op Vital Signs: stable  Last Vitals:  Filed Vitals:   05/27/15 1640  BP: 129/64  Pulse: 48  Temp: 36.4 C  Resp: 16    Complications: No apparent anesthesia complications

## 2015-05-27 NOTE — Interval H&P Note (Signed)
History and Physical Interval Note:  05/27/2015 1:24 PM  [name]  has presented today for surgery, with the diagnosis of Colon Cancer  The various methods of treatment have been discussed with the patient and family. After consideration of risks, benefits and other options for treatment, the patient has consented to  Procedure(s): INSERTION PORT-A-CATH (N/A) as a surgical intervention .  The patient's history has been reviewed, patient examined, no change in status, stable for surgery.  I have reviewed the patient's chart and labs.  Questions were answered to the patient's satisfaction.     [name]

## 2015-05-27 NOTE — Op Note (Signed)
OPERATIVE NOTE -  Preoperative diagnosis:  Metastatic colon cancer  Postoperative diagnosis:  same  Procedure: Ultrasound-guided Port-A-Cath insertion into right/left internal jugular vein under fluoroscopy.  Surgeon: [name] M.D.  Anesthesia: Local (Xylocaine) with MAC.  Indication:  This is a 54 year old male with metastatic colon cancer who required long term venous access for chemotherapy.  Technique: He was brought to the operating room, placed supine on the operating table, and intravenous sedation was given. A roll was placed under the back and the arms were tucked. Hair on the chest wall was clipped as was necessary. The upper chest wall and neck were sterilely prepped and draped.  His head was rotated to the left. Using the ultrasound, the right internal jugular vein was identified. Local anesthetic was infiltrated in the skin and subcutaneous tissue just anterior to it. A 16-gauge needle was used to cannulate the right internal jugular vein under ultrasound guidance. A wire was then threaded through the needle into the internal jugular vein and down into the right heart under ultrasound and fluoroscopic guidance.  Local anesthetic was infiltrated into the right upper chest wall. A right upper chest wall incision was made and a pocket was created for the Portacath.  An incision was made around the wire in the neck. The catheter was then tunneled from the chest wall incision up through the neck incision.  A dilator- introducer complex was placed over the wire into the superior vena cava. The dilator and wire were then removed and the catheter was threaded through the peel-away sheath introducer into the right heart. The introducer was then peeled away and removed. Under fluoroscopic guidance, the tip of the catheter was then pulled back until it was at the junction of the superior vena cava and right atrium. The catheter was then connected to the port.  The port aspirated blood and  flushed easily.  The port was then anchored to the chest wall with 2-0 Vicryl suture. Concentrated heparin solution was then placed into the port. The port and catheter position were then verified using fluoroscopy. The subcutaneous tissue was then closed over the port with running 3-0 Vicryl suture. The skin incisions were then closed with 4-0 Monocryl subcuticular stitches. Steri-Strips and sterile dressings were applied.  The procedure was well-tolerated without any apparent complications. He/She was taken to the recovery room in satisfactory condition where a portable chest x-ray is pending.

## 2015-05-27 NOTE — Anesthesia Preprocedure Evaluation (Signed)
Anesthesia Evaluation  Patient identified by MRN, date of birth, ID band Patient awake    Reviewed: Allergy & Precautions, H&P , NPO status , Patient's Chart, lab work & pertinent test results  Airway Mallampati: III  TM Distance: >3 FB Neck ROM: full    Dental  (+) Dental Advisory Given, Poor Dentition, Chipped, Missing Missing and broken front lateral teeth upper.  All teeth in bad shape:   Pulmonary sleep apnea ,  sarcoid   Pulmonary exam normal breath sounds clear to auscultation       Cardiovascular Exercise Tolerance: Good hypertension, negative cardio ROS Normal cardiovascular exam Rhythm:regular Rate:Normal  PACs   Neuro/Psych  Neuromuscular disease negative psych ROS   GI/Hepatic negative GI ROS, Neg liver ROS, GERD  ,  Endo/Other  Morbid obesity  Renal/GU      Musculoskeletal   Abdominal   Peds  Hematology negative hematology ROS (+) anemia , hgb 8.8   Anesthesia Other Findings   Reproductive/Obstetrics negative OB ROS                             Anesthesia Physical Anesthesia Plan  ASA: III  Anesthesia Plan: MAC   Post-op Pain Management:    Induction:   Airway Management Planned:   Additional Equipment:   Intra-op Plan:   Post-operative Plan:   Informed Consent: I have reviewed the patients History and Physical, chart, labs and discussed the procedure including the risks, benefits and alternatives for the proposed anesthesia with the patient or authorized representative who has indicated his/her understanding and acceptance.     Plan Discussed with: CRNA and Surgeon  Anesthesia Plan Comments:         Anesthesia Quick Evaluation

## 2015-05-27 NOTE — Transfer of Care (Signed)
Immediate Anesthesia Transfer of Care Note  Patient: [name]  Procedure(s) Performed: Procedure(s): INSERTION PORT-A-CATH (N/A)  Patient Location: PACU  Anesthesia Type:MAC  Level of Consciousness: awake, alert  and oriented  Airway & Oxygen Therapy: Patient Spontanous Breathing and Patient connected to face mask oxygen  Post-op Assessment: Report given to RN and Post -op Vital signs reviewed and stable  Post vital signs: Reviewed and stable  Last Vitals:  Filed Vitals:   05/27/15 1208  BP: 152/97  Pulse: 78  Temp: 36.8 C  Resp: 20    Complications: No apparent anesthesia complications

## 2015-05-27 NOTE — Discharge Instructions (Signed)
° ° °  PORT-A-CATH: POST OP INSTRUCTIONS  Always review your discharge instruction sheet given to you by the facility where your surgery was performed.   1. A prescription for pain medication may be given to you upon discharge. Take your pain medication as prescribed, if needed. If narcotic pain medicine is not needed, then you make take acetaminophen (Tylenol) or ibuprofen (Advil) as needed.  2. Take your usually prescribed medications unless otherwise directed. 3. If you need a refill on your pain medication, please contact our office. All narcotic pain medicine now requires a paper prescription.  Phoned in and fax refills are no longer allowed by law.  Prescriptions will not be filled after 5 pm or on weekends.  4. You should follow a light diet for the remainder of the day after your procedure. 5. Most patients will experience some mild swelling and/or bruising in the area of the incision. It may take several days to resolve. 6. It is common to experience some constipation if taking pain medication after surgery. Increasing fluid intake and taking a stool softener (such as Colace) will usually help or prevent this problem from occurring. A mild laxative (Milk of Magnesia or Miralax) should be taken according to package directions if there are no bowel movements after 48 hours.  7. Unless discharge instructions indicate otherwise, you may remove your bandages 72 hours after surgery, and you may shower one day after your surgery. You may have steri-strips (small white skin tapes) in place directly over the incision.  These strips should be left on the skin for 7-10 days.  If your surgeon used Dermabond (skin glue) on the incision, you may shower in 24 hours.  The glue will flake off over the next 2-3 weeks.  8. If your port is left accessed at the end of surgery (needle left in port), the dressing cannot get wet and should only by changed by a healthcare professional. When the port is no longer accessed  (when the needle has been removed), follow step 7.   9. ACTIVITIES:  Limit heavy activity with your right arm for the next 72 hours. Do no strenuous exercise or activity for 1 week. You may drive when you are no longer taking prescription pain medication, you can comfortably wear a seatbelt, and you can maneuver your car. 10.You need to see your doctor in the office for a follow-up appointment in 6 weeks.  Please call to make this appointment. 11.When you receive a new Port-a-Cath, you will get a product guide and        ID card.  Please keep them in case you need them.  WHEN TO CALL YOUR DOCTOR [phone]): 1. Fever over 101.0 2. Chills 3. Continued bleeding from incision 4. Increased redness and tenderness at the site 5. Shortness of breath, difficulty breathing   The clinic staff is available to answer your questions during regular business hours. Please dont hesitate to call and ask to speak to one of the nurses or medical assistants for clinical concerns. If you have a medical emergency, go to the nearest emergency room or call 911.  A surgeon from [facility] is always on call at the hospital.     For further information, please visit [email or web address]

## 2015-05-27 NOTE — H&P (Signed)
[name] is an 54 y.o. male.   Chief Complaint:   Here for elective surgery HPI:  He has metastatic colon cancer and needs long term venous access for chemotherapy.  Past Medical History  Diagnosis Date  . Sarcoid 10/2012  . Neuropathy   . 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 Extreme obesity 12/21/2012  . LBP (low back pain) 05/28/2014  . Adiposity 03/21/2015  . PONV (postoperative nausea and vomiting)   . Iron deficiency anemia due to chronic blood loss 05/08/2015  . Hypertension     has been off BP med x 6 months  . Arthritis   . GERD (gastroesophageal reflux disease)   . Abdominal pain     mid abdomen  . History of kidney stones   . History of transfusion   . Colon cancer   . Skin cancer     "it flairs up with sarcoidosis"    Past Surgical History  Procedure Laterality Date  . Cardiac catheterization    . Flexible sigmoidoscopy N/A 05/07/2015    Procedure: FLEXIBLE SIGMOIDOSCOPY;  Surgeon: Laurence Spates, MD;  Location: [facility];  Service: Endoscopy;  Laterality: N/A;  . Laparoscopic partial colectomy N/A 05/07/2015    Procedure: LAPAROSCOPIC ASSITED PARTIAL COLECTOMY WITH COLOSTOMY, SMALL BOWEL RESECTION, EXCISION OF PERITONEAL NODULE;  Surgeon: [name], MD;  Location: [facility];  Service: General;  Laterality: N/A;    Family History  Problem Relation Age of Onset  . Arthritis Mother   . Heart disease Mother   . Stroke Father    Social History:  reports that he has been passively smoking.  He has never used smokeless tobacco. He reports that he does not drink alcohol or use illicit drugs.  Allergies:  Allergies  Allergen Reactions  . Fentanyl Nausea And Vomiting    IV med  . Versed [Midazolam] Nausea And Vomiting  . Gabapentin     Swallowing problems  . Paba Derivatives Nausea And Vomiting    Pt states he is allergic to unknown anesthesia. Pt has nausea and vomiting with anesthesia.     Medications  Prior to Admission  Medication Sig Dispense Refill  . AMBIEN 10 MG tablet Take 10 mg by mouth at bedtime as needed for sleep.   4  . fentaNYL (DURAGESIC - DOSED MCG/HR) 12 MCG/HR Place 1 patch (12.5 mcg total) onto the skin every 3 (three) days. 10 patch 0  . HYDROcodone-acetaminophen (NORCO/VICODIN) 5-325 MG per tablet Take 1-2 tablets by mouth every 4 (four) hours as needed for moderate pain. 40 tablet 0  . ondansetron (ZOFRAN) 4 MG tablet Take 1 tablet (4 mg total) by mouth every 6 (six) hours as needed for nausea. 20 tablet 0    Results for orders placed or performed during the hospital encounter of 05/26/15 (from the past 48 hour(s))  CBC     Status: Abnormal   Collection Time: 05/26/15  2:45 PM  Result Value Ref Range   WBC 6.8 4.0 - 10.5 K/uL   RBC 4.30 4.22 - 5.81 MIL/uL   Hemoglobin 9.8 (L) 13.0 - 17.0 g/dL   HCT 33.5 (L) 39.0 - 52.0 %   MCV 77.9 (L) 78.0 - 100.0 fL   MCH 22.8 (L) 26.0 - 34.0 pg   MCHC 29.3 (L) 30.0 - 36.0 g/dL   RDW 25.5 (H) 11.5 - 15.5 %   Platelets 504 (H) 150 - 400 K/uL  Basic metabolic panel  Status: None   Collection Time: 05/26/15  2:45 PM  Result Value Ref Range   Sodium 140 135 - 145 mmol/L   Potassium 3.8 3.5 - 5.1 mmol/L   Chloride 106 101 - 111 mmol/L   CO2 26 22 - 32 mmol/L   Glucose, Bld 91 65 - 99 mg/dL   BUN 10 6 - 20 mg/dL   Creatinine, Ser 0.99 0.61 - 1.24 mg/dL   Calcium 8.9 8.9 - 10.3 mg/dL   GFR calc non Af Amer >60 >60 mL/min   GFR calc Af Amer >60 >60 mL/min    Comment: (NOTE) The eGFR has been calculated using the CKD EPI equation. This calculation has not been validated in all clinical situations. eGFR's persistently <60 mL/min signify possible Chronic Kidney Disease.    Anion gap 8 5 - 15   No results found.  Review of Systems  Constitutional: Negative for fever and chills.  Gastrointestinal: Negative for nausea and vomiting.    Blood pressure 152/97, pulse 78, temperature 98.2 F (36.8 C), temperature source  Oral, resp. rate 20, height _0  (1.803 m), weight 133.811 kg (295 lb), SpO2 99 %. Physical Exam  Constitutional: No distress.  Obese male.  HENT:  Head: Normocephalic and atraumatic.  Cardiovascular: Normal rate and regular rhythm.   Respiratory: Effort normal and breath sounds normal.  GI: Soft.  Limited midline incision with staples present and a small amount of serous drainage.  Left sided colostomy.  Neurological: He is alert.     Assessment/Plan Metastatic colon cancer s/p partial colectomy and colostomy.  Plan:  Ultrasound guided Port-a-cath insertion.  Alder Murri J 05/27/2015, 1:21 PM

## 2015-05-28 ENCOUNTER — Encounter (HOSPITAL_COMMUNITY): Payer: Self-pay | Admitting: General Surgery

## 2015-05-29 ENCOUNTER — Inpatient Hospital Stay: Payer: BLUE CROSS/BLUE SHIELD

## 2015-05-29 ENCOUNTER — Other Ambulatory Visit: Payer: Self-pay | Admitting: *Deleted

## 2015-05-29 DIAGNOSIS — C186 Malignant neoplasm of descending colon: Secondary | ICD-10-CM

## 2015-05-29 MED ORDER — HYDROCODONE-ACETAMINOPHEN 5-325 MG PO TABS: 1.0000 | ORAL_TABLET | ORAL | Status: DC | PRN

## 2015-05-29 MED ORDER — LIDOCAINE-PRILOCAINE 2.5-2.5 % EX CREA: 1.0000 "application " | TOPICAL_CREAM | CUTANEOUS | Status: DC | PRN

## 2015-05-29 NOTE — Telephone Encounter (Signed)
Asking for refill on hydrocodone last got #40 on 05/20/15. Also needs EMLA cream

## 2015-05-29 NOTE — Telephone Encounter (Signed)
Informed that prescription is ready to pick up  

## 2015-05-30 ENCOUNTER — Other Ambulatory Visit: Payer: Self-pay | Admitting: *Deleted

## 2015-06-02 ENCOUNTER — Inpatient Hospital Stay: Payer: BLUE CROSS/BLUE SHIELD

## 2015-06-02 ENCOUNTER — Inpatient Hospital Stay (HOSPITAL_BASED_OUTPATIENT_CLINIC_OR_DEPARTMENT_OTHER): Payer: BLUE CROSS/BLUE SHIELD | Admitting: Oncology

## 2015-06-02 ENCOUNTER — Encounter: Payer: Self-pay | Admitting: Oncology

## 2015-06-02 VITALS — HR 91 | Temp 96.6°F | Wt 300.3 lb

## 2015-06-02 VITALS — BP 149/87 | HR 73 | Temp 96.0°F | Resp 18

## 2015-06-02 DIAGNOSIS — Z79899 Other long term (current) drug therapy: Secondary | ICD-10-CM | POA: Diagnosis not present

## 2015-06-02 DIAGNOSIS — Z933 Colostomy status: Secondary | ICD-10-CM | POA: Diagnosis not present

## 2015-06-02 DIAGNOSIS — C186 Malignant neoplasm of descending colon: Secondary | ICD-10-CM

## 2015-06-02 MED ORDER — OXALIPLATIN CHEMO INJECTION 100 MG/20ML
85.0000 mg/m2 | Freq: Once | INTRAVENOUS | Status: AC
  Administered 2015-06-02: 190 mg via INTRAVENOUS
  Filled 2015-06-02: qty 38

## 2015-06-02 MED ORDER — SODIUM CHLORIDE 0.9 % IV SOLN
Freq: Once | INTRAVENOUS | Status: AC
  Administered 2015-06-02: 11:00:00 via INTRAVENOUS
  Filled 2015-06-02: qty 5

## 2015-06-02 MED ORDER — FLUOROURACIL CHEMO INJECTION 2.5 GM/50ML
400.0000 mg/m2 | Freq: Once | INTRAVENOUS | Status: AC
  Administered 2015-06-02: 900 mg via INTRAVENOUS
  Filled 2015-06-02: qty 18

## 2015-06-02 MED ORDER — DEXTROSE 5 % IV SOLN
1000.0000 mg | Freq: Once | INTRAVENOUS | Status: AC
  Administered 2015-06-02: 1000 mg via INTRAVENOUS
  Filled 2015-06-02: qty 50

## 2015-06-02 MED ORDER — FLUOROURACIL CHEMO INJECTION 5 GM/100ML
2400.0000 mg/m2 | INTRAVENOUS | Status: DC
  Administered 2015-06-02: 5350 mg via INTRAVENOUS
  Filled 2015-06-02: qty 107

## 2015-06-02 MED ORDER — PALONOSETRON HCL INJECTION 0.25 MG/5ML
0.2500 mg | Freq: Once | INTRAVENOUS | Status: AC
  Administered 2015-06-02: 0.25 mg via INTRAVENOUS
  Filled 2015-06-02: qty 5

## 2015-06-02 MED ORDER — DEXTROSE 5 % IV SOLN
Freq: Once | INTRAVENOUS | Status: AC
  Administered 2015-06-02: 10:00:00 via INTRAVENOUS
  Filled 2015-06-02: qty 1000

## 2015-06-02 NOTE — Progress Notes (Signed)
Patient does have living will.  Patient would like MD to check incision where staples were removed.

## 2015-06-02 NOTE — Progress Notes (Signed)
[place] @ [facility] [phone]  [phone]  [name] OB: [birth year]  MR#: [id number]  XBL#:[id number]  Patient Care Team: [name], MD as PCP - General (Family Medicine) [name], MD as Consulting Physician (General Surgery)  CHIEF COMPLAINT:  No chief complaint on file.  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 Specimen integrity: Intact. Macroscopic tumor perforation: Not identified. Invasive tumor: Maximum size: 5.5 cm. Histologic type(s): Invasive adenocarcinoma. Histologic grade and differentiation: G1: well differentiated/low grade  Pathologic Staging: pT4b, pN1b, pM1b Ancillary studies: MMR and MSI will be performed. MSI stable -k-Ras pending VISIT DIAGNOSIS:   No diagnosis found.    No history exists.    Oncology Flowsheet 05/06/2015 05/06/2015 05/07/2015 05/08/2015 05/09/2015 05/10/2015 05/27/2015  dexamethasone (DECADRON) IJ - - - - - - -  enoxaparin (LOVENOX) [place] 40 mg   40 mg 40 mg 40 mg 40 mg -  ferumoxytol (FERAHEME) IV - - - - 510 mg - -  ondansetron (ZOFRAN) IJ - - - - - - -  ondansetron (ZOFRAN) IV 4 mg 4 mg - 4 mg 4 mg - -    INTERVAL HISTORY: 54 year old gentleman is somewhat depressed.  Started having increasing pain in the left lower abdominal area since March of 2016.  Patient was admitted in the hospital CT scan revealed possibility of diverticulitis versus obstructing lesion patient was treated with antibiotics. Colonoscopy was not done.  Patient condition continued to decline had a repeat CT scan in August of 2016patient then was evaluated by surgeon at [facility] and underwent surgical intervention.. Patient had   Peritoneal  nodules which were resected.  2 lymph nodes were positive.. Continues to have abdominal pain.  Difficulty dealing with colostomy.  Wife is changing colostomy bag Appetite is  poor and has lost approximately 40 pounds of weight since  March of 2016 Taking frequent hydrocodone medication Abdominal wound is still healing Stitches to be removed on September 14 Patient is here to discuss possibility of adjuvant treatmentREVIEW OF SYSTEMS:   Review of system: GENERAL:Patient is feeling somewhat depressed.  PERFORMANCE STATUS (ECOG):  01 HEENT:  No visual changes, runny nose, sore throat, mouth sores or tenderness. Lungs: No shortness of breath or cough.  No hemoptysis. Cardiac:  No chest pain, palpitations, orthopnea, or PND. GI:  No nausea, vomiting, diarrhea, constipation, melena or hematochezia. Has colostomy.  Difficulty dealing with colostomy.  Persistent abdominal pain requiring hydrocodone to control pain Musculoskeletal:  No back pain.  No joint pain.  No muscle tenderness. GU: No hematuria. Extremities:  No pain or swelling. Skin:  No rashes or skin changes. Neuro:  No headache, numbness or weakness, balance or coordination issues. Endocrine:  No diabetes, thyroid issues, hot flashes or night sweats. Psych:  No mood changes, depression or anxiety. Pain:  No focal pain. Patient has a history of sarcoidosis diagnosis in 2014 Was given Lyrica for neuropathy.  But patient has discontinued that because of progressive joint pains Review of systems:  All other systems reviewed and found to be negative.   As per HPI. Otherwise, a complete review of systems is negatve.  PAST MEDICAL HISTORY: Past Medical History  Diagnosis Date  . Sarcoid 10/2012  . Neuropathy   . 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 Extreme obesity 12/21/2012  . LBP (low back pain) 05/28/2014  . Adiposity 03/21/2015  .  PONV (postoperative nausea and vomiting)   . Iron deficiency anemia due to chronic blood loss 05/08/2015  . Hypertension     has been off BP med x 6 months  . Arthritis   . GERD (gastroesophageal reflux disease)   .  Abdominal pain     mid abdomen  . History of kidney stones   . History of transfusion   . Colon cancer   . Skin cancer     "it flairs up with sarcoidosis"    PAST SURGICAL HISTORY: Past Surgical History  Procedure Laterality Date  . Cardiac catheterization    . Flexible sigmoidoscopy N/A 05/07/2015    Procedure: FLEXIBLE SIGMOIDOSCOPY;  Surgeon: Laurence Spates, MD;  Location: [facility] END[facility]COPY;  Service: Endoscopy;  Laterality: N/A;  . Laparoscopic partial colectomy N/A 05/07/2015    Procedure: LAPAROSCOPIC ASSITED PARTIAL COLECTOMY WITH COLOSTOMY, SMALL BOWEL RESECTION, EXCISION OF PERITONEAL NODULE;  Surgeon: [name], MD;  Location: [facility] ORS;  Service: General;  Laterality: N/A;  . Portacath placement Right 05/27/2015    Procedure: INSERTION PORT-A-CATH;  Surgeon: [name], MD;  Location: [facility] ORS;  Service: General;  Laterality: Right;    FAMILY HISTORY Family History  Problem Relation Age of Onset  . Arthritis Mother   . Heart disease Mother   . Stroke Father         ADVANCED DIRECTIVES:  Patient does not have any living will or healthcare power of attorney.  Information was given .  Available resources had been discussed.  We will follow-up on subsequent appointments regarding this issue  HEALTH MAINTENANCE: Social History  Substance Use Topics  . Smoking status: Passive Smoke Exposure - Never Smoker  . Smokeless tobacco: Never Used  . Alcohol Use: No     Colonoscopy:  PAP:  Bone density:  Lipid panel:  Allergies  Allergen Reactions  . Fentanyl Nausea And Vomiting    IV med  . Versed [Midazolam] Nausea And Vomiting  . Gabapentin     Swallowing problems  . Paba Derivatives Nausea And Vomiting    Pt states he is allergic to unknown anesthesia. Pt has nausea and vomiting with anesthesia.     Current Outpatient Prescriptions  Medication Sig Dispense Refill  . AMBIEN 10 MG tablet Take 10 mg by mouth at bedtime as needed for sleep.   4  . fentaNYL  (DURAGESIC - DOSED MCG/HR) 12 MCG/HR Place 1 patch (12.5 mcg total) onto the skin every 3 (three) days. 10 patch 0  . HYDROcodone-acetaminophen (NORCO/VICODIN) 5-325 MG per tablet Take 1-2 tablets by mouth every 4 (four) hours as needed for moderate pain. 60 tablet 0  . lidocaine-prilocaine (EMLA) cream Apply 1 application topically as needed. 30 g 0  . ondansetron (ZOFRAN) 4 MG tablet Take 1 tablet (4 mg total) by mouth every 6 (six) hours as needed for nausea. 20 tablet 0   No current facility-administered medications for this visit.    OBJECTIVE: PHYSICAL EXAM: GENERAL:  Well developed, well nourished, Patient is in somewhat pain and in distress. MENTAL STATUS:  Alert and oriented to person, place and time.   RESPIRATORY:  Clear to auscultation without rales, wheezes or rhonchi. CARDIOVASCULAR:  Regular rate and rhythm without murmur, rub or gallop.  ABDOMEN:  Soft, non-tender, with active bowel sounds, and no hepatosplenomegaly.  No masses. Wound is healing well Colostomy functioning well BACK:  No CVA tenderness.  No tenderness on percussion of the back or rib cage. SKIN:  No rashes, ulcers or lesions.  EXTREMITIES: No edema, no skin discoloration or tenderness.  No palpable cords. LYMPH NODES: No palpable cervical, supraclavicular, axillary or inguinal adenopathy  NEUROLOGICAL: Unremarkable. PSYCH:  depression  There were no vitals filed for this visit.   There is no weight on file to calculate BMI.    ECOG FS:1 - Symptomatic but completely ambulatory  LAB RESULTS:  No visits with results within 5 Day(s) from this visit. Latest known visit with results is:  Hospital Outpatient Visit on 05/26/2015  Component Date Value Ref Range Status  . WBC 05/26/2015 6.8  4.0 - 10.5 K/uL Final  . RBC 05/26/2015 4.30  4.22 - 5.81 MIL/uL Final  . Hemoglobin 05/26/2015 9.8* 13.0 - 17.0 g/dL Final  . HCT 05/26/2015 33.5* 39.0 - 52.0 % Final  . MCV 05/26/2015 77.9* 78.0 - 100.0 fL Final  .  MCH 05/26/2015 22.8* 26.0 - 34.0 pg Final  . MCHC 05/26/2015 29.3* 30.0 - 36.0 g/dL Final  . RDW 05/26/2015 25.5* 11.5 - 15.5 % Final  . Platelets 05/26/2015 504* 150 - 400 K/uL Final  . Sodium 05/26/2015 140  135 - 145 mmol/L Final  . Potassium 05/26/2015 3.8  3.5 - 5.1 mmol/L Final  . Chloride 05/26/2015 106  101 - 111 mmol/L Final  . CO2 05/26/2015 26  22 - 32 mmol/L Final  . Glucose, Bld 05/26/2015 91  65 - 99 mg/dL Final  . BUN 05/26/2015 10  6 - 20 mg/dL Final  . Creatinine, Ser 05/26/2015 0.99  0.61 - 1.24 mg/dL Final  . Calcium 05/26/2015 8.9  8.9 - 10.3 mg/dL Final  . GFR calc non Af Amer 05/26/2015 >60  >60 mL/min Final  . GFR calc Af Amer 05/26/2015 >60  >60 mL/min Final   Comment: (NOTE) The eGFR has been calculated using the CKD EPI equation. This calculation has not been validated in all clinical situations. eGFR's persistently <60 mL/min signify possible Chronic Kidney Disease.   . Anion gap 05/26/2015 8  5 - 15 Final     STUDIES: Ct Abdomen Pelvis W Contrast  05/05/2015   CLINICAL DATA:  Acute generalized abdominal pain.  EXAM: CT ABDOMEN AND PELVIS WITH CONTRAST  TECHNIQUE: Multidetector CT imaging of the abdomen and pelvis was performed using the standard protocol following bolus administration of intravenous contrast.  CONTRAST:  185m OMNIPAQUE IOHEXOL 300 MG/ML  SOLN  COMPARISON:  None.  FINDINGS: Visualized lung bases appear normal. No significant osseous abnormality is noted.  No gallstones are noted. The liver, spleen and pancreas appear normal. Adrenal glands appear normal. Bilateral nonobstructive nephrolithiasis is noted. No hydronephrosis or renal obstruction is noted. No ureteral calculi are noted. There appears to be moderate to severe dilatation of the right colon, transverse colon and proximal descending colon with persistent narrowing seen in the distal portion of the descending colon. There appears to be a soft tissue mass in this area concerning for  possible neoplasm. This measures 3.4 x 3.1 cm. It appears to extend to an adjacent loop of small bowel, but does not appear to cause small bowel obstruction. Urinary bladder appears normal. No significant adenopathy is noted. Mass is noted along the anterior portion the peritoneal measuring 29 x 11 mm concerning for carcinomatosis, best seen on image number 61 of series 2.  IMPRESSION: Bilateral nonobstructive nephrolithiasis.  Obstruction of the ascending, transverse and proximal descending colon is noted secondary to probable mass in distal portion of descending colon. This soft tissue abnormality appears to extend to adjacent loop of  small bowel. 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 Colonoscopy is recommended to evaluate for possible colonic malignancy.   Electronically Signed   By: Marijo Conception, M.D.   On: 05/05/2015 16:14   Dg Chest Port 1 View  05/27/2015   CLINICAL DATA:  Port-A-Cath placement  EXAM: AP chest radiograph  COMPARISON:  None  FINDINGS: Right Port-A-Cath with tip projecting over the superior vena cava in the region of the azygos arch. No pneumothorax. Mild right upper lobe interstitial change. Mild cardiac enlargement. Minimal atelectasis at the left base.  IMPRESSION: Port-A-Cath as described   Electronically Signed   By: Skipper Cliche M.D.   On: 05/27/2015 15:58   Dg C-arm 1-60 Min-no Report  05/27/2015   CLINICAL DATA: surgery   C-ARM 1-60 MINUTES  Fluoroscopy was utilized by the requesting physician.  No radiographic  interpretation.     ASSESSMENT:  Carcinoma of distal descending colon T4 b(small bowel involvement) N1 B (2 of 15 lymph nodes positive)   M1 B2 tumor(  Peritoneal nodules) 2.sarcoidosis with involvement of mediastinal lymph node and axillary lymph node by history. 3. No significant family history of any colon cancer or any other malignancy 4. Preop CEA was less than 1 5.Preop  CT scan has been reviewed does not show any evidence of liver metastases 6. Patient did not have colonoscopy 7. MSI stable 8. K-ras has been ordered   PLAN:   I had detailed discussion with patient and family All the records from the [facility] as well as from CT scan from local hospital has been reviewed Possibility of adjuvant chemotherapy with  FOLFOX  Is being discussed. Patient would need port placement can be done by his surgeon at [facility] or by vascular surgeon locally. Patient would attend  A chemotherapy class For pain control be started Patient on fentanyl patch with oxycodone breakthrough pain medication Patient was advised to gradually get off oxycodone followed by discontinuing fentanyl patch All the side effects of chemotherapy including myelosuppression, alopecia, nausea vomiting fatigue weakness.  Secondary infection, and   peripheral neuropathy .  Has been discussed in details. Informal consent has been obtained and will be documented by nurses in the chart Patient was explained all the side effects of chemotherapy.  And informed consent   Will be  Obtained. Patient may need antidepressant medication like Cymbalta or Celexa Recommended colostomy nurse evaluation for him to deal with colostomy  somewhat better.  Appointment has been made.     Patient expressed understanding and was in agreement with this plan. He also understands that He can call clinic at any time with any questions, concerns, or complaints.    No matching staging information was found for the patient.  Forest Gleason, MD   06/02/2015 9:07 AM

## 2015-06-02 NOTE — Progress Notes (Signed)
[place] @ [facility] [phone]  Fax:([phone] [place]  ADRIANN BALLWEG OB: [birth year]  MR#: [id number]  ONG#:[id number]  Patient Care Team: [name], MD as PCP - General (Family Medicine) [name], MD as Consulting Physician (General Surgery)  CHIEF COMPLAINT:  Chief Complaint  Patient presents with  . Follow-up   1.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 Specimen integrity: Intact. Macroscopic tumor perforation: Not identified. Invasive tumor: Maximum size: 5.5 cm. Histologic type(s): Invasive adenocarcinoma. Histologic grade and differentiation: G1: well differentiated/low grade  Pathologic Staging: pT4b, pN1b, pM1b Ancillary studies: MMR and MSI will be performed. MSI stable -k-Ras pending VISIT DIAGNOSIS:     ICD-9-CM ICD-10-CM   1. 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    Oncology Flowsheet 05/06/2015 05/06/2015 05/07/2015 05/08/2015 05/09/2015 05/10/2015 05/27/2015  dexamethasone (DECADRON) IJ - - - - - - -  enoxaparin (LOVENOX) [place] 40 mg   40 mg 40 mg 40 mg 40 mg -  ferumoxytol (FERAHEME) IV - - - - 510 mg - -  ondansetron (ZOFRAN) IJ - - - - - - -  ondansetron (ZOFRAN) IV 4 mg 4 mg - 4 mg 4 mg - -    INTERVAL HISTORY: 53 year old gentleman is somewhat depressed.  Started having increasing pain in the left lower abdominal area since March of 2016.  Patient was admitted in the hospital CT scan revealed possibility of diverticulitis versus obstructing lesion patient was treated with antibiotics. Colonoscopy was not done.  Patient condition continued to decline had a repeat CT scan in August of 2016patient then was evaluated by surgeon at [facility] and underwent surgical intervention.. Patient  had   Peritoneal  nodules which were resected.  2 lymph nodes were positive.. Continues to have abdominal pain.  Difficulty dealing with colostomy.  Wife is changing colostomy bag Appetite is poor and has lost approximately 40 pounds of weight since  March of 2016 Taking frequent hydrocodone medication Abdominal wound is still healing Stitches to be removed on September 14 Patient is here to discuss possibility of adjuvant treatment.  Patient is here for ongoing evaluation and treatment consideration Patient is starting adjuvant chemotherapy from September 19 Had a port placement Plan number of questions regarding chemotherapy REVIEW OF SYSTEMS:   Review of system: GENERAL:Patient is feeling somewhat depressed.  PERFORMANCE STATUS (ECOG):  01 HEENT:  No visual changes, runny nose, sore throat, mouth sores or tenderness. Lungs: No shortness of breath or cough.  No hemoptysis. Cardiac:  No chest pain, palpitations, orthopnea, or PND. GI:  No nausea, vomiting, diarrhea, constipation, melena or hematochezia.   small area in abdominal wound is still healing  Has colostomy.  Difficulty dealing with colostomy.  Persistent abdominal pain requiring hydrocodone to control pain Musculoskeletal:  No back pain.  No joint pain.  No muscle tenderness. GU: No hematuria. Extremities:  No pain or swelling. Skin:  No rashes or skin changes. Neuro:  No headache, numbness or weakness, balance or coordination issues. Endocrine:  No diabetes, thyroid issues, hot flashes or night sweats. Psych:  No mood changes, depression or anxiety. Pain:  No focal pain. Patient has a history of sarcoidosis diagnosis in 2014 Was given Lyrica for neuropathy.  But patient has discontinued that because of progressive  joint pains Review of systems:  All other systems reviewed and found to be negative.   As per HPI. Otherwise, a complete review of systems is negatve.  PAST MEDICAL HISTORY: Past Medical History  Diagnosis  Date  . Sarcoid 10/2012  . Neuropathy   . 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 Extreme obesity 12/21/2012  . LBP (low back pain) 05/28/2014  . Adiposity 03/21/2015  . PONV (postoperative nausea and vomiting)   . Iron deficiency anemia due to chronic blood loss 05/08/2015  . Hypertension     has been off BP med x 6 months  . Arthritis   . GERD (gastroesophageal reflux disease)   . Abdominal pain     mid abdomen  . History of kidney stones   . History of transfusion   . Colon cancer   . Skin cancer     "it flairs up with sarcoidosis"    PAST SURGICAL HISTORY: Past Surgical History  Procedure Laterality Date  . Cardiac catheterization    . Flexible sigmoidoscopy N/A 05/07/2015    Procedure: FLEXIBLE SIGMOIDOSCOPY;  Surgeon: Laurence Spates, MD;  Location: [facility] ENDOSCOPY;  Service: Endoscopy;  Laterality: N/A;  . Laparoscopic partial colectomy N/A 05/07/2015    Procedure: LAPAROSCOPIC ASSITED PARTIAL COLECTOMY WITH COLOSTOMY, SMALL BOWEL RESECTION, EXCISION OF PERITONEAL NODULE;  Surgeon: [name], MD;  Location: [facility] ORS;  Service: General;  Laterality: N/A;  . Portacath placement Right 05/27/2015    Procedure: INSERTION PORT-A-CATH;  Surgeon: [name], MD;  Location: [facility] ORS;  Service: General;  Laterality: Right;    FAMILY HISTORY Family History  Problem Relation Age of Onset  . Arthritis Mother   . Heart disease Mother   . Stroke Father         ADVANCED DIRECTIVES:  Patient does not have any living will or healthcare power of attorney.  Information was given .  Available resources had been discussed.  We will follow-up on subsequent appointments regarding this issue  HEALTH MAINTENANCE: Social History  Substance Use Topics  . Smoking status: Passive Smoke Exposure - Never Smoker  . Smokeless tobacco: Never Used  . Alcohol Use: No     Colonoscopy:  PAP:  Bone density:  Lipid panel:  Allergies  Allergen  Reactions  . Fentanyl Nausea And Vomiting    IV med  . Versed [Midazolam] Nausea And Vomiting  . Gabapentin     Swallowing problems  . Paba Derivatives Nausea And Vomiting    Pt states he is allergic to unknown anesthesia. Pt has nausea and vomiting with anesthesia.     Current Outpatient Prescriptions  Medication Sig Dispense Refill  . AMBIEN 10 MG tablet Take 10 mg by mouth at bedtime as needed for sleep.   4  . fentaNYL (DURAGESIC - DOSED MCG/HR) 12 MCG/HR Place 1 patch (12.5 mcg total) onto the skin every 3 (three) days. 10 patch 0  . HYDROcodone-acetaminophen (NORCO/VICODIN) 5-325 MG per tablet Take 1-2 tablets by mouth every 4 (four) hours as needed for moderate pain. 60 tablet 0  . lidocaine-prilocaine (EMLA) cream Apply 1 application topically as needed. 30 g 0  . ondansetron (ZOFRAN) 4 MG tablet Take 1 tablet (4 mg total) by mouth every 6 (six) hours as needed for nausea. 20 tablet 0   No current facility-administered medications for this visit.    OBJECTIVE: PHYSICAL EXAM: GENERAL:  Well developed, well nourished, Patient is in somewhat pain and in distress. MENTAL STATUS:  Alert and oriented to person, place and time.   RESPIRATORY:  Clear to auscultation without rales, wheezes or rhonchi. CARDIOVASCULAR:  Regular rate and rhythm without murmur, rub or gallop.  ABDOMEN:  Soft, non-tender, with active bowel sounds, and no hepatosplenomegaly.  No masses. Wound is healing well Colostomy functioning well BACK:  No CVA tenderness.  No tenderness on percussion of the back or rib cage. SKIN:  No rashes, ulcers or lesions. EXTREMITIES: No edema, no skin discoloration or tenderness.  No palpable cords. LYMPH NODES: No palpable cervical, supraclavicular, axillary or inguinal adenopathy  NEUROLOGICAL: Unremarkable. PSYCH:  depression  Filed Vitals:   06/02/15 0906  Pulse: 91  Temp: 96.6 F (35.9 C)     Body mass index is 41.9 kg/(m^2).    ECOG FS:1 - Symptomatic but  completely ambulatory  LAB RESULTS:  No visits with results within 5 Day(s) from this visit. Latest known visit with results is:  Hospital Outpatient Visit on 05/26/2015  Component Date Value Ref Range Status  . WBC 05/26/2015 6.8  4.0 - 10.5 K/uL Final  . RBC 05/26/2015 4.30  4.22 - 5.81 MIL/uL Final  . Hemoglobin 05/26/2015 9.8* 13.0 - 17.0 g/dL Final  . HCT 05/26/2015 33.5* 39.0 - 52.0 % Final  . MCV 05/26/2015 77.9* 78.0 - 100.0 fL Final  . MCH 05/26/2015 22.8* 26.0 - 34.0 pg Final  . MCHC 05/26/2015 29.3* 30.0 - 36.0 g/dL Final  . RDW 05/26/2015 25.5* 11.5 - 15.5 % Final  . Platelets 05/26/2015 504* 150 - 400 K/uL Final  . Sodium 05/26/2015 140  135 - 145 mmol/L Final  . Potassium 05/26/2015 3.8  3.5 - 5.1 mmol/L Final  . Chloride 05/26/2015 106  101 - 111 mmol/L Final  . CO2 05/26/2015 26  22 - 32 mmol/L Final  . Glucose, Bld 05/26/2015 91  65 - 99 mg/dL Final  . BUN 05/26/2015 10  6 - 20 mg/dL Final  . Creatinine, Ser 05/26/2015 0.99  0.61 - 1.24 mg/dL Final  . Calcium 05/26/2015 8.9  8.9 - 10.3 mg/dL Final  . GFR calc non Af Amer 05/26/2015 >60  >60 mL/min Final  . GFR calc Af Amer 05/26/2015 >60  >60 mL/min Final   Comment: (NOTE) The eGFR has been calculated using the CKD EPI equation. This calculation has not been validated in all clinical situations. eGFR's persistently <60 mL/min signify possible Chronic Kidney Disease.   . Anion gap 05/26/2015 8  5 - 15 Final     STUDIES: Ct Abdomen Pelvis W Contrast  05/05/2015   CLINICAL DATA:  Acute generalized abdominal pain.  EXAM: CT ABDOMEN AND PELVIS WITH CONTRAST  TECHNIQUE: Multidetector CT imaging of the abdomen and pelvis was performed using the standard protocol following bolus administration of intravenous contrast.  CONTRAST:  149m OMNIPAQUE IOHEXOL 300 MG/ML  SOLN  COMPARISON:  None.  FINDINGS: Visualized lung bases appear normal. No significant osseous abnormality is noted.  No gallstones are noted. The liver,  spleen and pancreas appear normal. Adrenal glands appear normal. Bilateral nonobstructive nephrolithiasis is noted. No hydronephrosis or renal obstruction is noted. No ureteral calculi are noted. There appears to be moderate to severe dilatation of the right colon, transverse colon and proximal descending colon with persistent narrowing seen in the distal portion of the descending colon. There appears to be a soft tissue mass in this area concerning for possible neoplasm. This measures 3.4 x 3.1 cm. It appears to extend to an adjacent loop of small bowel,  but does not appear to cause small bowel obstruction. Urinary bladder appears normal. No significant adenopathy is noted. Mass is noted along the anterior portion the peritoneal measuring 29 x 11 mm concerning for carcinomatosis, best seen on image number 61 of series 2.  IMPRESSION: Bilateral nonobstructive nephrolithiasis.  Obstruction of the ascending, transverse and proximal descending colon is noted secondary to probable mass in distal portion of descending colon. This soft tissue abnormality appears to extend to adjacent loop of small bowel. 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 Colonoscopy is recommended to evaluate for possible colonic malignancy.   Electronically Signed   By: Marijo Conception, M.D.   On: 05/05/2015 16:14   Dg Chest Port 1 View  05/27/2015   CLINICAL DATA:  Port-A-Cath placement  EXAM: AP chest radiograph  COMPARISON:  None  FINDINGS: Right Port-A-Cath with tip projecting over the superior vena cava in the region of the azygos arch. No pneumothorax. Mild right upper lobe interstitial change. Mild cardiac enlargement. Minimal atelectasis at the left base.  IMPRESSION: Port-A-Cath as described   Electronically Signed   By: Skipper Cliche M.D.   On: 05/27/2015 15:58   Dg C-arm 1-60 Min-no Report  05/27/2015   CLINICAL DATA: surgery   C-ARM 1-60 MINUTES   Fluoroscopy was utilized by the requesting physician.  No radiographic  interpretation.     ASSESSMENT:  Carcinoma of distal descending colon T4 b(small bowel involvement) N1 B (2 of 15 lymph nodes positive)   M1 B2 tumor(  Peritoneal nodules) 2.sarcoidosis with involvement of mediastinal lymph node and axillary lymph node by history. 3. No significant family history of any colon cancer or any other malignancy 4. Preop CEA was less than 1 5.Preop CT scan has been reviewed does not show any evidence of liver metastases 6. Patient did not have colonoscopy 7. MSI stable 8. K-ras has been ordered   PLAN:   I had detailed discussion with patient and family All the records from the [facility] as well as from CT scan from local hospital has been reviewed Proceed with chemotherapy.  Informed consent has been obtained.      Patient expressed understanding and was in agreement with this plan. He also understands that He can call clinic at any time with any questions, concerns, or complaints.    No matching staging information was found for the patient.  Forest Gleason, MD   06/02/2015 9:33 AM

## 2015-06-02 NOTE — Progress Notes (Signed)
13:05:  Patient reports being sweaty for a few minutes, paused chemo, took vitals which were within normal limits (see flowsheet), called Dr. Oliva Bustard who came over to see the patient.  Dr. Oliva Bustard reported clear lungs and heart and attributed to steroids in premeds - patient stated he does have sweats when taking steroids.  Dr. Oliva Bustard ordered to continue chemo.  Patient had no further symptoms and sweatiness resolved.  LJ

## 2015-06-04 ENCOUNTER — Inpatient Hospital Stay: Payer: BLUE CROSS/BLUE SHIELD

## 2015-06-04 VITALS — BP 133/78 | HR 50 | Temp 96.2°F | Resp 18

## 2015-06-04 DIAGNOSIS — C186 Malignant neoplasm of descending colon: Secondary | ICD-10-CM

## 2015-06-04 MED ORDER — HEPARIN SOD (PORK) LOCK FLUSH 100 UNIT/ML IV SOLN
INTRAVENOUS | Status: AC
  Filled 2015-06-04: qty 5

## 2015-06-04 MED ORDER — SODIUM CHLORIDE 0.9 % IJ SOLN
10.0000 mL | INTRAMUSCULAR | Status: DC | PRN
  Administered 2015-06-04: 10 mL
  Filled 2015-06-04: qty 10

## 2015-06-04 MED ORDER — HEPARIN SOD (PORK) LOCK FLUSH 100 UNIT/ML IV SOLN
500.0000 [IU] | Freq: Once | INTRAVENOUS | Status: AC | PRN
Start: 2015-06-04 — End: 2015-06-04
  Administered 2015-06-04: 500 [IU]

## 2015-06-05 ENCOUNTER — Telehealth: Payer: Self-pay

## 2015-06-05 DIAGNOSIS — C186 Malignant neoplasm of descending colon: Secondary | ICD-10-CM

## 2015-06-05 MED ORDER — HYDROCODONE-ACETAMINOPHEN 5-325 MG PO TABS: 1.0000 | ORAL_TABLET | ORAL | Status: DC | PRN

## 2015-06-05 NOTE — Telephone Encounter (Signed)
  Oncology Nurse Navigator Documentation    Navigator Encounter Type: Telephone (06/05/15 1100)                      Time Spent with Patient: 15 (06/05/15 1100)   Pt inquiring about norco refill. Prescription will be available for pickup by 1330.

## 2015-06-05 NOTE — Addendum Note (Signed)
Addended by: Telford Nab on: 06/05/2015 12:03 PM   Modules accepted: Orders, Medications

## 2015-06-12 ENCOUNTER — Telehealth: Payer: Self-pay | Admitting: *Deleted

## 2015-06-12 NOTE — Telephone Encounter (Signed)
Spoke with answering service, patient has 2 tablets left of the vicodin. He has been in an extreme amount of pain since his ostomy placement for colon cancer. Dr. Grayland Ormond to write RX hard copy script. Patient's wife will come to cancer center to pick up RX today.

## 2015-06-16 ENCOUNTER — Other Ambulatory Visit: Payer: BLUE CROSS/BLUE SHIELD

## 2015-06-16 ENCOUNTER — Telehealth: Payer: Self-pay

## 2015-06-16 ENCOUNTER — Ambulatory Visit: Payer: BLUE CROSS/BLUE SHIELD | Admitting: Oncology

## 2015-06-16 ENCOUNTER — Encounter: Payer: Self-pay | Admitting: Oncology

## 2015-06-16 ENCOUNTER — Inpatient Hospital Stay: Payer: BLUE CROSS/BLUE SHIELD | Attending: Oncology

## 2015-06-16 ENCOUNTER — Inpatient Hospital Stay (HOSPITAL_BASED_OUTPATIENT_CLINIC_OR_DEPARTMENT_OTHER): Payer: BLUE CROSS/BLUE SHIELD | Admitting: Oncology

## 2015-06-16 ENCOUNTER — Inpatient Hospital Stay: Payer: BLUE CROSS/BLUE SHIELD

## 2015-06-16 VITALS — BP 158/102 | HR 108 | Temp 96.5°F | Wt 295.5 lb

## 2015-06-16 DIAGNOSIS — C186 Malignant neoplasm of descending colon: Secondary | ICD-10-CM

## 2015-06-16 DIAGNOSIS — Z87442 Personal history of urinary calculi: Secondary | ICD-10-CM | POA: Insufficient documentation

## 2015-06-16 DIAGNOSIS — Z79899 Other long term (current) drug therapy: Secondary | ICD-10-CM | POA: Diagnosis not present

## 2015-06-16 DIAGNOSIS — K219 Gastro-esophageal reflux disease without esophagitis: Secondary | ICD-10-CM | POA: Insufficient documentation

## 2015-06-16 DIAGNOSIS — G893 Neoplasm related pain (acute) (chronic): Secondary | ICD-10-CM | POA: Diagnosis not present

## 2015-06-16 DIAGNOSIS — Z933 Colostomy status: Secondary | ICD-10-CM | POA: Insufficient documentation

## 2015-06-16 DIAGNOSIS — Z5111 Encounter for antineoplastic chemotherapy: Secondary | ICD-10-CM | POA: Insufficient documentation

## 2015-06-16 DIAGNOSIS — I10 Essential (primary) hypertension: Secondary | ICD-10-CM | POA: Insufficient documentation

## 2015-06-16 LAB — COMPREHENSIVE METABOLIC PANEL
ALBUMIN: 3.5 g/dL (ref 3.5–5.0)
ALT: 39 U/L (ref 17–63)
AST: 38 U/L (ref 15–41)
Alkaline Phosphatase: 111 U/L (ref 38–126)
Anion gap: 10 (ref 5–15)
BUN: 10 mg/dL (ref 6–20)
CHLORIDE: 102 mmol/L (ref 101–111)
CO2: 24 mmol/L (ref 22–32)
Calcium: 8.2 mg/dL — ABNORMAL LOW (ref 8.9–10.3)
Creatinine, Ser: 0.88 mg/dL (ref 0.61–1.24)
GFR calc Af Amer: >60 mL/min (ref >60)
GFR calc non Af Amer: >60 mL/min (ref >60)
GLUCOSE: 129 mg/dL — AB (ref 65–99)
POTASSIUM: 3.5 mmol/L (ref 3.5–5.1)
SODIUM: 136 mmol/L (ref 135–145)
Total Bilirubin: 0.2 mg/dL — ABNORMAL LOW (ref 0.3–1.2)
Total Protein: 7.6 g/dL (ref 6.5–8.1)

## 2015-06-16 LAB — CBC WITH DIFFERENTIAL/PLATELET
Basophils Absolute: 0.1 10*3/uL (ref 0–0.1)
Basophils Relative: 1 %
Eosinophils Absolute: 0.1 10*3/uL (ref 0–0.7)
Eosinophils Relative: 2 %
HEMATOCRIT: 33.7 % — AB (ref 40.0–52.0)
Hemoglobin: 10.6 g/dL — ABNORMAL LOW (ref 13.0–18.0)
LYMPHS ABS: 1.1 10*3/uL (ref 1.0–3.6)
LYMPHS PCT: 15 %
MCH: 24.2 pg — ABNORMAL LOW (ref 26.0–34.0)
MCHC: 31.6 g/dL — ABNORMAL LOW (ref 32.0–36.0)
MCV: 76.6 fL — ABNORMAL LOW (ref 80.0–100.0)
MONOS PCT: 7 %
Monocytes Absolute: 0.5 10*3/uL (ref 0.2–1.0)
NEUTROS PCT: 75 %
Neutro Abs: 5.3 10*3/uL (ref 1.4–6.5)
PLATELETS: 336 10*3/uL (ref 150–440)
RBC: 4.4 MIL/uL (ref 4.40–5.90)
RDW: 24.8 % — AB (ref 11.5–14.5)
WBC: 7.1 10*3/uL (ref 3.8–10.6)

## 2015-06-16 LAB — MAGNESIUM: Magnesium: 2 mg/dL (ref 1.7–2.4)

## 2015-06-16 MED ORDER — FENTANYL 25 MCG/HR TD PT72: 25.0000 ug | MEDICATED_PATCH | TRANSDERMAL | Status: DC

## 2015-06-16 MED ORDER — OXALIPLATIN CHEMO INJECTION 100 MG/20ML
85.0000 mg/m2 | Freq: Once | INTRAVENOUS | Status: AC
  Administered 2015-06-16: 165 mg via INTRAVENOUS
  Filled 2015-06-16: qty 20

## 2015-06-16 MED ORDER — LEUCOVORIN CALCIUM INJECTION 350 MG
1000.0000 mg | Freq: Once | INTRAVENOUS | Status: AC
  Administered 2015-06-16: 1000 mg via INTRAVENOUS
  Filled 2015-06-16: qty 50

## 2015-06-16 MED ORDER — PALONOSETRON HCL INJECTION 0.25 MG/5ML
0.2500 mg | Freq: Once | INTRAVENOUS | Status: AC
  Administered 2015-06-16: 0.25 mg via INTRAVENOUS
  Filled 2015-06-16: qty 5

## 2015-06-16 MED ORDER — DEXTROSE 5 % IV SOLN
Freq: Once | INTRAVENOUS | Status: AC
  Administered 2015-06-16: 11:00:00 via INTRAVENOUS
  Filled 2015-06-16: qty 1000

## 2015-06-16 MED ORDER — FLUOROURACIL CHEMO INJECTION 2.5 GM/50ML
400.0000 mg/m2 | Freq: Once | INTRAVENOUS | Status: AC
  Administered 2015-06-16: 800 mg via INTRAVENOUS
  Filled 2015-06-16: qty 16

## 2015-06-16 MED ORDER — FOSAPREPITANT DIMEGLUMINE INJECTION 150 MG
Freq: Once | INTRAVENOUS | Status: AC
  Administered 2015-06-16: 11:00:00 via INTRAVENOUS
  Filled 2015-06-16: qty 5

## 2015-06-16 MED ORDER — SODIUM CHLORIDE 0.9 % IJ SOLN
10.0000 mL | INTRAMUSCULAR | Status: DC | PRN
  Administered 2015-06-16 (×2): 10 mL
  Filled 2015-06-16: qty 10

## 2015-06-16 MED ORDER — HEPARIN SOD (PORK) LOCK FLUSH 100 UNIT/ML IV SOLN: 500.0000 [IU] | Freq: Once | INTRAVENOUS | Status: DC | PRN

## 2015-06-16 MED ORDER — SODIUM CHLORIDE 0.9 % IV SOLN
2400.0000 mg/m2 | INTRAVENOUS | Status: DC
  Administered 2015-06-16: 4650 mg via INTRAVENOUS
  Filled 2015-06-16: qty 93

## 2015-06-16 MED ORDER — HYDROCODONE-ACETAMINOPHEN 5-325 MG PO TABS: 1.0000 | ORAL_TABLET | ORAL | Status: DC | PRN

## 2015-06-16 NOTE — Progress Notes (Signed)
Patient does have living will. Passive smoker.  Patient states his surgical wound has opened at top and bottom of wound.  Requesting refill for pain patch and med.

## 2015-06-16 NOTE — Progress Notes (Signed)
Met with patient and his wife Lupita Dawn today in chemotherapy suite.  I have introduced research study "Study of Concordance Between Circulating Tumor DNA Assay and [employer] Tissue Analysis for Genomic Alterations" offered through [place].  I explained to patient and his wife that the study is comparing results from tumor tissue already processed through [employer] (results issued June 02, 2015) and whole blood.  The study is asking for patient to supply two vials of whole blood for use as comparison.  Patient was informed that blood was have to be drawn peripherally as per study guidelines and port could not be used.  Patient agreeable to study, but states he does not feel like completing study requirements today. I have given patient a copy of the consent to review and he is agreeable for me to meet with him at his next appointment where he will sign consent and give blood sample.  I spent approximately 15 minutes with patient and wife.

## 2015-06-16 NOTE — Telephone Encounter (Signed)
Doses adjusted today using IBW for BSA of 1.65m2 due to side effects

## 2015-06-16 NOTE — Progress Notes (Signed)
[place] @ [facility] [phone]  [phone] [name]  AMBROSIO Campos OB: May 08, 1961  MR#: [id number]  JYN#:[id number]  Patient Care Team: [name], MD as PCP - General (Family Medicine) [name], MD as Consulting Physician (General Surgery)  CHIEF COMPLAINT:  Chief Complaint  Patient presents with  . 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 Specimen integrity: Intact. Macroscopic tumor perforation: Not identified. Invasive tumor: Maximum size: 5.5 cm. Histologic type(s): Invasive adenocarcinoma. Histologic grade and differentiation: G1: well differentiated/low grade  Pathologic Staging: pT4b, pN1b, pM1b Ancillary studies: MMR and MSI will be performed. MSI stable -k-Ras pending VISIT DIAGNOSIS:     ICD-9-CM ICD-10-CM   1. 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    Oncology Flowsheet 05/07/2015 05/08/2015 05/09/2015 05/10/2015 05/27/2015 06/02/2015 06/02/2015  Day, Cycle - - - - - Day 1, Cycle 1    dexamethasone (DECADRON) IJ - - - - - - -  dexamethasone (DECADRON) IV - - - - - [ 12 mg ]    enoxaparin (LOVENOX) [place] 40 mg 40 mg 40 mg 40 mg - - -  ferumoxytol (FERAHEME) IV - - 510 mg - - - -  fluorouracil (ADRUCIL) IV - - - - - 400 mg/m2 2,400 mg/m2  fosaprepitant (EMEND) IV - - - - - [ 150 mg ]    leucovorin IV - - - - - 1,000 mg    ondansetron (ZOFRAN) IJ - - - - - - -  ondansetron (ZOFRAN) IV - 4 mg 4 mg - - - -  oxaliplatin (ELOXATIN) IV - - - - - 85 mg/m2    palonosetron (ALOXI) IV - - - - - 0.25 mg      INTERVAL HISTORY: 54 year old gentleman is somewhat depressed.  Started  having increasing pain in the left lower abdominal area since March of 2016.  Patient was admitted in the hospital CT scan revealed possibility of diverticulitis versus obstructing lesion patient was treated with antibiotics. Patient continues to have abdominal pain.  Drainage from abdominal wound.  Patient is taking frequent Vicodin.  Using fentanyl patch 12 g.  No nausea no vomiting appetite has been stable. Here for further follow-up and treatment consideration.  According to him had 2 days of diarrhea after chemotherapy took Imodium which has helped. Somewhat depressed. REVIEW OF SYSTEMS:   Review of system: GENERAL:Patient is feeling somewhat depressed.  PERFORMANCE STATUS (ECOG):  01 HEENT:  No visual changes, runny nose, sore throat, mouth sores or tenderness. Lungs: No shortness of breath or cough.  No hemoptysis. Cardiac:  No chest pain, palpitations, orthopnea, or PND. GI:  No nausea, vomiting, diarrhea, constipation, melena or hematochezia.   small area in abdominal wound is still healing  Has colostomy.  Difficulty dealing with colostomy.  Persistent abdominal pain requiring hydrocodone to control pain Abdominal wound is healing.  Per tenderness present.  Some drainage which is significant.  Tenderness around that area. Musculoskeletal:  No back pain.  No joint pain.  No muscle tenderness. GU: No hematuria. Extremities:  No pain or swelling. Skin:  No rashes or skin changes. Neuro:  No  headache, numbness or weakness, balance or coordination issues. Endocrine:  No diabetes, thyroid issues, hot flashes or night sweats. Psych:  No mood changes, depression or anxiety. Pain:  No focal pain. Patient has a history of sarcoidosis diagnosis in 2014 Was given Lyrica for neuropathy.  But patient has discontinued that because of progressive joint pains Review of systems:  All other systems reviewed and found to be negative.   As per HPI. Otherwise, a complete review of systems is  negatve.  PAST MEDICAL HISTORY: Past Medical History  Diagnosis Date  . Sarcoid ([place]) 10/2012  . Neuropathy ([place])   . 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 Besnier-Boeck disease ([place]) 12/21/2012  . Extreme obesity ([place]) 12/21/2012  . LBP (low back pain) 05/28/2014  . Adiposity 03/21/2015  . PONV (postoperative nausea and vomiting)   . Iron deficiency anemia due to chronic blood loss 05/08/2015  . Hypertension     has been off BP med x 6 months  . Arthritis   . GERD (gastroesophageal reflux disease)   . Abdominal pain     mid abdomen  . History of kidney stones   . History of transfusion   . Colon cancer ([place])   . Skin cancer     "it flairs up with sarcoidosis"    PAST SURGICAL HISTORY: Past Surgical History  Procedure Laterality Date  . Cardiac catheterization    . Flexible sigmoidoscopy N/A 05/07/2015    Procedure: FLEXIBLE SIGMOIDOSCOPY;  Surgeon: Laurence Spates, MD;  Location: [facility] ENDOSCOPY;  Service: Endoscopy;  Laterality: N/A;  . Laparoscopic partial colectomy N/A 05/07/2015    Procedure: LAPAROSCOPIC ASSITED PARTIAL COLECTOMY WITH COLOSTOMY, SMALL BOWEL RESECTION, EXCISION OF PERITONEAL NODULE;  Surgeon: [name], MD;  Location: [facility] ORS;  Service: General;  Laterality: N/A;  . Portacath placement Right 05/27/2015    Procedure: INSERTION PORT-A-CATH;  Surgeon: [name], MD;  Location: [facility] ORS;  Service: General;  Laterality: Right;    FAMILY HISTORY Family History  Problem Relation Age of Onset  . Arthritis Mother   . Heart disease Mother   . Stroke Father         ADVANCED DIRECTIVES:  Patient does not have any living will or healthcare power of attorney.  Information was given .  Available resources had been discussed.  We will follow-up on subsequent appointments regarding this issue  HEALTH MAINTENANCE: Social History  Substance Use Topics  . Smoking status: Passive Smoke Exposure - Never Smoker  . Smokeless tobacco: Never Used  .  Alcohol Use: No      Allergies  Allergen Reactions  . Fentanyl Nausea And Vomiting    IV med  . Versed [Midazolam] Nausea And Vomiting  . Gabapentin     Swallowing problems  . Paba Derivatives Nausea And Vomiting    Pt states he is allergic to unknown anesthesia. Pt has nausea and vomiting with anesthesia.     Current Outpatient Prescriptions  Medication Sig Dispense Refill  . AMBIEN 10 MG tablet Take 10 mg by mouth at bedtime as needed for sleep.   4  . HYDROcodone-acetaminophen (NORCO/VICODIN) 5-325 MG tablet Take 1-2 tablets by mouth every 4 (four) hours as needed for moderate pain. 60 tablet 0  . lidocaine-prilocaine (EMLA) cream Apply 1 application topically as needed. 30 g 0  . ondansetron (ZOFRAN) 4 MG tablet Take 1 tablet (4 mg total) by mouth every 6 (six) hours as needed for nausea. 20 tablet 0  . fentaNYL (DURAGESIC -  DOSED MCG/HR) 25 MCG/HR patch Place 1 patch (25 mcg total) onto the skin every 3 (three) days. 10 patch 0   No current facility-administered medications for this visit.   Facility-Administered Medications Ordered in Other Visits  Medication Dose Route Frequency Provider Last Rate Last Dose  . heparin lock flush 100 unit/mL  500 Units Intracatheter Once PRN Forest Gleason, MD      . sodium chloride 0.9 % injection 10 mL  10 mL Intracatheter PRN Forest Gleason, MD   10 mL at 06/16/15 0848    OBJECTIVE: PHYSICAL EXAM: GENERAL:  Well developed, well nourished, Patient is in somewhat pain and in distress. MENTAL STATUS:  Alert and oriented to person, place and time.   RESPIRATORY:  Clear to auscultation without rales, wheezes or rhonchi. CARDIOVASCULAR:  Regular rate and rhythm without murmur, rub or gallop.  ABDOMEN:  Soft, non-tender, with active bowel sounds, and no hepatosplenomegaly.  No masses. Wound is still draining. Patient is putting Neosporin Colostomy functioning well BACK:  No CVA tenderness.  No tenderness on percussion of the back or rib  cage. SKIN:  No rashes, ulcers or lesions. EXTREMITIES: No edema, no skin discoloration or tenderness.  No palpable cords. LYMPH NODES: No palpable cervical, supraclavicular, axillary or inguinal adenopathy  NEUROLOGICAL: Unremarkable. PSYCH:  depression  Filed Vitals:   06/16/15 0924  BP: 158/102  Pulse: 108  Temp: 96.5 F (35.8 C)     Body mass index is 41.23 kg/(m^2).    ECOG FS:1 - Symptomatic but completely ambulatory  LAB RESULTS:  Infusion on 06/16/2015  Component Date Value Ref Range Status  . WBC 06/16/2015 7.1  3.8 - 10.6 K/uL Final   A-LINE DRAW  . RBC 06/16/2015 4.40  4.40 - 5.90 MIL/uL Final  . Hemoglobin 06/16/2015 10.6* 13.0 - 18.0 g/dL Final  . HCT 06/16/2015 33.7* 40.0 - 52.0 % Final  . MCV 06/16/2015 76.6* 80.0 - 100.0 fL Final  . MCH 06/16/2015 24.2* 26.0 - 34.0 pg Final  . MCHC 06/16/2015 31.6* 32.0 - 36.0 g/dL Final  . RDW 06/16/2015 24.8* 11.5 - 14.5 % Final  . Platelets 06/16/2015 336  150 - 440 K/uL Final  . Neutrophils Relative % 06/16/2015 75   Final  . Neutro Abs 06/16/2015 5.3  1.4 - 6.5 K/uL Final  . Lymphocytes Relative 06/16/2015 15   Final  . Lymphs Abs 06/16/2015 1.1  1.0 - 3.6 K/uL Final  . Monocytes Relative 06/16/2015 7   Final  . Monocytes Absolute 06/16/2015 0.5  0.2 - 1.0 K/uL Final  . Eosinophils Relative 06/16/2015 2   Final  . Eosinophils Absolute 06/16/2015 0.1  0 - 0.7 K/uL Final  . Basophils Relative 06/16/2015 1   Final  . Basophils Absolute 06/16/2015 0.1  0 - 0.1 K/uL Final  . Sodium 06/16/2015 136  135 - 145 mmol/L Final  . Potassium 06/16/2015 3.5  3.5 - 5.1 mmol/L Final  . Chloride 06/16/2015 102  101 - 111 mmol/L Final  . CO2 06/16/2015 24  22 - 32 mmol/L Final  . Glucose, Bld 06/16/2015 129* 65 - 99 mg/dL Final  . BUN 06/16/2015 10  6 - 20 mg/dL Final  . Creatinine, Ser 06/16/2015 0.88  0.61 - 1.24 mg/dL Final  . Calcium 06/16/2015 8.2* 8.9 - 10.3 mg/dL Final  . Total Protein 06/16/2015 7.6  6.5 - 8.1 g/dL Final  .  Albumin 06/16/2015 3.5  3.5 - 5.0 g/dL Final  . AST 06/16/2015 38  15 -  41 U/L Final  . ALT 06/16/2015 39  17 - 63 U/L Final  . Alkaline Phosphatase 06/16/2015 111  38 - 126 U/L Final  . Total Bilirubin 06/16/2015 0.2* 0.3 - 1.2 mg/dL Final  . GFR calc non Af Amer 06/16/2015 >60  >60 mL/min Final  . GFR calc Af Amer 06/16/2015 >60  >60 mL/min Final   Comment: (NOTE) The eGFR has been calculated using the CKD EPI equation. This calculation has not been validated in all clinical situations. eGFR's persistently <60 mL/min signify possible Chronic Kidney Disease.   . Anion gap 06/16/2015 10  5 - 15 Final  . Magnesium 06/16/2015 2.0  1.7 - 2.4 mg/dL Final     STUDIES: Dg Chest Port 1 View  05/27/2015   CLINICAL DATA:  Port-A-Cath placement  EXAM: AP chest radiograph  COMPARISON:  None  FINDINGS: Right Port-A-Cath with tip projecting over the superior vena cava in the region of the azygos arch. No pneumothorax. Mild right upper lobe interstitial change. Mild cardiac enlargement. Minimal atelectasis at the left base.  IMPRESSION: Port-A-Cath as described   Electronically Signed   By: Skipper Cliche M.D.   On: 05/27/2015 15:58   Dg C-arm 1-60 Min-no Report  05/27/2015   CLINICAL DATA: surgery   C-ARM 1-60 MINUTES  Fluoroscopy was utilized by the requesting physician.  No radiographic  interpretation.     ASSESSMENT:  Carcinoma of distal descending colon T4 b(small bowel involvement) N1 B (2 of 15 lymph nodes positive)   M1 B2 tumor(  Peritoneal nodules) 2.sarcoidosis with involvement of mediastinal lymph node and axillary lymph node by history. 3. No significant family history of any colon cancer or any other malignancy 4. Preop CEA was less than 1 5.Preop CT scan has been reviewed does not show any evidence of liver metastases 6. Patient did not have colonoscopy 7. MSI stable 8. K-ras has been ordered   PLAN:   Due to diarrhea.  We will decrease the dose to ideal body weight which  is body surface area 1.94 m Patient was advised to take Imodium any diarrhea has not better than IV fluid will be given. Patient was advised not to use Neosporin and keep wound dry and clean patient has been referred to wound care center Continue chemotherapy Abdominal pain Fentanyl has been increased to 25 g.  Patient was advised to cut down on hydrocodone pill.  Because of Tylenol content All lab data has been reviewed    Patient expressed understanding and was in agreement with this plan. He also understands that He can call clinic at any time with any questions, concerns, or complaints.    No matching staging information was found for the patient.  Forest Gleason, MD   06/16/2015 10:22 AM

## 2015-06-18 ENCOUNTER — Inpatient Hospital Stay: Payer: BLUE CROSS/BLUE SHIELD

## 2015-06-18 VITALS — BP 121/81 | HR 60 | Temp 96.3°F

## 2015-06-18 DIAGNOSIS — C186 Malignant neoplasm of descending colon: Secondary | ICD-10-CM | POA: Diagnosis not present

## 2015-06-18 DIAGNOSIS — C801 Malignant (primary) neoplasm, unspecified: Secondary | ICD-10-CM

## 2015-06-18 MED ORDER — SODIUM CHLORIDE 0.9 % IJ SOLN
10.0000 mL | Freq: Once | INTRAMUSCULAR | Status: AC
  Administered 2015-06-18: 10 mL via INTRAVENOUS
  Filled 2015-06-18: qty 10

## 2015-06-18 MED ORDER — HEPARIN SOD (PORK) LOCK FLUSH 100 UNIT/ML IV SOLN
500.0000 [IU] | Freq: Once | INTRAVENOUS | Status: AC
  Administered 2015-06-18: 500 [IU] via INTRAVENOUS

## 2015-06-18 MED ORDER — HEPARIN SOD (PORK) LOCK FLUSH 100 UNIT/ML IV SOLN
INTRAVENOUS | Status: AC
  Filled 2015-06-18: qty 5

## 2015-06-18 NOTE — Progress Notes (Signed)
Research study [facility] (MPR-1) The [facility] Patient Repository and Nurse, mental health.   Patient consented to MPR-1 study on 06/02/2015.  I received notification from [facility] on 06/17/2015 that patient was confirmed to be eligible.  I have obtained tumor tissue block from [facility] pathology and have sent this (via FedEx) to [facility].  The submission of the tumor block completes patients participation in this study.

## 2015-06-23 ENCOUNTER — Other Ambulatory Visit: Payer: Self-pay | Admitting: *Deleted

## 2015-06-23 DIAGNOSIS — C186 Malignant neoplasm of descending colon: Secondary | ICD-10-CM

## 2015-06-23 MED ORDER — FENTANYL 25 MCG/HR TD PT72: 50.0000 ug | MEDICATED_PATCH | TRANSDERMAL | Status: DC

## 2015-06-23 NOTE — Addendum Note (Signed)
Addended by: Betti Cruz on: 06/23/2015 03:18 PM   Modules accepted: Orders

## 2015-06-23 NOTE — Telephone Encounter (Signed)
Per L Herring AGNP-C will not refill Hydrocodone, he is using too much. We can however increase his patch up to 37.5 or 50 mcg if he does not have any 12 mcg patches left. I called patietn and he does not have any 12 mcg patches left so he was advised to use 2 patches of 25 mcg which he repeated back to me and he was also informed that we will not be refilling his Norco. He stated "alright"

## 2015-06-23 NOTE — Telephone Encounter (Signed)
Last got #60 tabs of Norco 5/325 mg on 10/3 and his fentanyl patch was increased at that time. He was advised on 10/3 to reduce the amount of Norco he is taking because of the Tylenol in it.

## 2015-06-24 ENCOUNTER — Encounter: Payer: BLUE CROSS/BLUE SHIELD | Attending: Surgery | Admitting: Surgery

## 2015-06-24 DIAGNOSIS — S31105A Unspecified open wound of abdominal wall, periumbilic region without penetration into peritoneal cavity, initial encounter: Secondary | ICD-10-CM | POA: Insufficient documentation

## 2015-06-24 DIAGNOSIS — X58XXXA Exposure to other specified factors, initial encounter: Secondary | ICD-10-CM | POA: Diagnosis not present

## 2015-06-24 DIAGNOSIS — C186 Malignant neoplasm of descending colon: Secondary | ICD-10-CM | POA: Diagnosis not present

## 2015-06-24 DIAGNOSIS — G629 Polyneuropathy, unspecified: Secondary | ICD-10-CM | POA: Insufficient documentation

## 2015-06-24 DIAGNOSIS — T8131XA Disruption of external operation (surgical) wound, not elsewhere classified, initial encounter: Secondary | ICD-10-CM | POA: Diagnosis not present

## 2015-06-24 DIAGNOSIS — M1990 Unspecified osteoarthritis, unspecified site: Secondary | ICD-10-CM | POA: Insufficient documentation

## 2015-06-24 DIAGNOSIS — Z9221 Personal history of antineoplastic chemotherapy: Secondary | ICD-10-CM | POA: Insufficient documentation

## 2015-06-25 NOTE — Progress Notes (Signed)
ROLLYN, SCIALDONE ([id number]) Visit Report for 06/24/2015 Abuse/Suicide Risk Screen Details Patient Name: [name], [name] Date of Service: 06/24/2015 1:00 PM Medical Record Number: [id number] Patient Account Number: [id number] Date of Birth/Sex: 03-19-1961 (54 y.o. Male) Treating RN: Cornell Barman Primary Care Physician: [name] Other Clinician: Referring Physician: Forest Gleason Treating Physician/Extender: Frann Rider in Treatment: 0 Abuse/Suicide Risk Screen Items Answer ABUSE/SUICIDE RISK SCREEN: Has anyone close to you tried to hurt or harm you recentlyo No Do you feel uncomfortable with anyone in your familyo No Has anyone forced you do things that you didnot want to doo No Do you have any thoughts of harming yourselfo No Patient displays signs or symptoms of abuse and/or neglect. No Electronic Signature(s) Signed: 06/24/2015 5:21:15 PM By: Gretta Cool, RN, BSN, Kim RN, BSN Entered By: Gretta Cool, RN, BSN, Kim on 06/24/2015 13:27:19 Violett, Daun Peacock ([id number]) -------------------------------------------------------------------------------- Activities of Daily Living Details Patient Name: Danek, [name] Date of Service: 06/24/2015 1:00 PM Medical Record Number: [id number] Patient Account Number: [id number] Date of Birth/Sex: 02-18-1961 (54 y.o. Male) Treating RN: Cornell Barman Primary Care Physician: [name] Other Clinician: Referring Physician: Forest Gleason Treating Physician/Extender: Frann Rider in Treatment: 0 Activities of Daily Living Items Answer Activities of Daily Living (Please select one for each item) Drive Automobile Completely Able Take Medications Completely Able Use Telephone Completely Able Care for Appearance Completely Able Use Toilet Completely Able Bath / Shower Completely Able Dress Self Completely Able Feed Self Completely Able Walk Completely Able Get In / Out Bed Completely Able Housework Completely Able Prepare Meals Completely  Able Handle Money Completely Able Shop for Self Completely Able Electronic Signature(s) Signed: 06/24/2015 5:21:15 PM By: Gretta Cool, RN, BSN, Kim RN, BSN Entered By: Gretta Cool, RN, BSN, Kim on 06/24/2015 13:27:31 Yankowski, Daun Peacock ([id number]) -------------------------------------------------------------------------------- Education Assessment Details Patient Name: [name], [name] Date of Service: 06/24/2015 1:00 PM Medical Record Number: [id number] Patient Account Number: [id number] Date of Birth/Sex: 14-Nov-1960 (54 y.o. Male) Treating RN: Cornell Barman Primary Care Physician: [name] Other Clinician: Referring Physician: Forest Gleason Treating Physician/Extender: Frann Rider in Treatment: 0 Primary Learner Assessed: Patient Learning Preferences/Education Level/Primary Language Learning Preference: Explanation, Demonstration Highest Education Level: High School Preferred Language: English Cognitive Barrier Assessment/Beliefs Language Barrier: No Translator Needed: No Memory Deficit: No Emotional Barrier: No Cultural/Religious Beliefs Affecting Medical No Care: Physical Barrier Assessment Impaired Vision: No Impaired Hearing: No Decreased Hand dexterity: No Knowledge/Comprehension Assessment Knowledge Level: High Comprehension Level: High Ability to understand written High instructions: Ability to understand verbal High instructions: Motivation Assessment Anxiety Level: Calm Cooperation: Cooperative Education Importance: Acknowledges Need Interest in Health Problems: Asks Questions Perception: Coherent Willingness to Engage in Self- High Management Activities: Readiness to Engage in Self- High Management Activities: Electronic Signature(s) TYLEE, NEWBY ([id number]) Signed: 06/24/2015 5:21:15 PM By: Gretta Cool, RN, BSN, Kim RN, BSN Entered By: Gretta Cool, RN, BSN, Kim on 06/24/2015 13:28:31 Strawser, Daun Peacock  ([id number]) -------------------------------------------------------------------------------- Fall Risk Assessment Details Patient Name: [name], [name] Date of Service: 06/24/2015 1:00 PM Medical Record Number: [id number] Patient Account Number: [id number] Date of Birth/Sex: 12/11/1960 (54 y.o. Male) Treating RN: Cornell Barman Primary Care Physician: [name] Other Clinician: Referring Physician: Forest Gleason Treating Physician/Extender: Frann Rider in Treatment: 0 Fall Risk Assessment Items FALL RISK ASSESSMENT: History of falling - immediate or within 3 months 0 No Secondary diagnosis 0 No Ambulatory aid None/bed rest/wheelchair/nurse 0 Yes Crutches/cane/walker 0 No Furniture 0 No IV Access/Saline Lock 0 No Gait/Training Normal/bed rest/immobile 0 Yes Weak  0 No Impaired 0 No Mental Status Oriented to own ability 0 Yes Electronic Signature(s) Signed: 06/24/2015 5:21:15 PM By: Gretta Cool, RN, BSN, Kim RN, BSN Entered By: Gretta Cool, RN, BSN, Kim on 06/24/2015 13:28:40 Vana, Daun Peacock ([id number]) -------------------------------------------------------------------------------- Nutrition Risk Assessment Details Patient Name: [name], [name] Date of Service: 06/24/2015 1:00 PM Medical Record Number: [id number] Patient Account Number: [id number] Date of Birth/Sex: 09/13/1961 (54 y.o. Male) Treating RN: Cornell Barman Primary Care Physician: [name] Other Clinician: Referring Physician: Forest Gleason Treating Physician/Extender: Frann Rider in Treatment: 0 Height (in): 71 Weight (lbs): 274 Body Mass Index (BMI): 38.2 Nutrition Risk Assessment Items NUTRITION RISK SCREEN: I have an illness or condition that made me change the kind and/or 2 Yes amount of food I eat I eat fewer than two meals per day 0 No I eat few fruits and vegetables, or milk products 0 No I have three or more drinks of beer, liquor or wine almost every day 0 No I have tooth or mouth problems  that make it hard for me to eat 0 No I don't always have enough money to buy the food I need 0 No I eat alone most of the time 0 No I take three or more different prescribed or over-the-counter drugs a 0 No day Without wanting to, I have lost or gained 10 pounds in the last six 2 Yes months I am not always physically able to shop, cook and/or feed myself 0 No Nutrition Protocols Good Risk Protocol Provide education on Moderate Risk Protocol 0 nutrition Electronic Signature(s) Signed: 06/24/2015 5:21:15 PM By: Gretta Cool, RN, BSN, Kim RN, BSN Entered By: Gretta Cool, RN, BSN, Kim on 06/24/2015 13:29:25

## 2015-06-25 NOTE — Progress Notes (Signed)
[name], Campos ([id number]) Visit Report for 06/24/2015 Allergy List Details Patient Name: [name], [name] Date of Service: 06/24/2015 1:00 PM Medical Record Number: [id number] Patient Account Number: [id number] Date of Birth/Sex: [birth year] (54 y.o. Male) Treating RN: Cornell Barman Primary Care Physician: [name] Other Clinician: Referring Physician: Forest Gleason Treating Physician/Extender: Frann Rider in Treatment: 0 Allergies Active Allergies No Known Drug Allergies Allergy Notes Electronic Signature(s) Signed: 06/24/2015 5:21:15 PM By: Gretta Cool, RN, BSN, Kim RN, BSN Entered By: Gretta Cool, RN, BSN, Kim on 06/24/2015 13:17:03 [name], [name] ([id number]) -------------------------------------------------------------------------------- Arrival Information Details Patient Name: [name], [name] Date of Service: 06/24/2015 1:00 PM Medical Record Number: [id number] Patient Account Number: [id number] Date of Birth/Sex: 1960-12-19 (54 y.o. Male) Treating RN: Cornell Barman Primary Care Physician: [name] Other Clinician: Referring Physician: Forest Gleason Treating Physician/Extender: Frann Rider in Treatment: 0 Visit Information Patient Arrived: Ambulatory Arrival Time: 13:14 Accompanied By: wife, Roxanne Transfer Assistance: None Patient Identification Verified: Yes Secondary Verification Process Yes Completed: Electronic Signature(s) Signed: 06/24/2015 5:21:15 PM By: Gretta Cool, RN, BSN, Kim RN, BSN Entered By: Gretta Cool, RN, BSN, Kim on 06/24/2015 13:14:56 [name], [name] ([id number]) -------------------------------------------------------------------------------- Clinic Level of Care Assessment Details Patient Name: [name], [name] Date of Service: 06/24/2015 1:00 PM Medical Record Number: [id number] Patient Account Number: [id number] Date of Birth/Sex: 07-Jan-1961 (54 y.o. Male) Treating RN: Cornell Barman Primary Care Physician: [name] Other Clinician: Referring  Physician: Forest Gleason Treating Physician/Extender: Frann Rider in Treatment: 0 Clinic Level of Care Assessment Items TOOL 2 Quantity Score []  - Use when only an EandM is performed on the INITIAL visit 0 ASSESSMENTS - Nursing Assessment / Reassessment []  - General Physical Exam (combine w/ comprehensive assessment (listed just 0 below) when performed on new pt. evals) []  - Comprehensive Assessment (HX, ROS, Risk Assessments, Wounds Hx, etc.) 0 ASSESSMENTS - Wound and Skin Assessment / Reassessment []  - Simple Wound Assessment / Reassessment - one wound 0 X - Complex Wound Assessment / Reassessment - multiple wounds 1 5 []  - Dermatologic / Skin Assessment (not related to wound area) 0 ASSESSMENTS - Ostomy and/or Continence Assessment and Care []  - Incontinence Assessment and Management 0 []  - Ostomy Care Assessment and Management (repouching, etc.) 0 PROCESS - Coordination of Care []  - Simple Patient / Family Education for ongoing care 0 X - Complex (extensive) Patient / Family Education for ongoing care 1 20 X - Staff obtains Programmer, systems, Records, Test Results / Process Orders 1 10 []  - Staff telephones HHA, Nursing Homes / Clarify orders / etc 0 []  - Routine Transfer to another Facility (non-emergent condition) 0 []  - Routine Hospital Admission (non-emergent condition) 0 X - New Admissions / Biomedical engineer / Ordering NPWT, Apligraf, etc. 1 15 []  - Emergency Hospital Admission (emergent condition) 0 X - Simple Discharge Coordination 1 10 [name], [name] ([id number]) []  - Complex (extensive) Discharge Coordination 0 PROCESS - Special Needs []  - Pediatric / Minor Patient Management 0 []  - Isolation Patient Management 0 []  - Hearing / Language / Visual special needs 0 []  - Assessment of Community assistance (transportation, D/C planning, etc.) 0 []  - Additional assistance / Altered mentation 0 []  - Support Surface(s) Assessment (bed, cushion, seat, etc.)  0 INTERVENTIONS - Wound Cleansing / Measurement X - Wound Imaging (photographs - any number of wounds) 1 5 []  - Wound Tracing (instead of photographs) 0 []  - Simple Wound Measurement - one wound 0 X - Complex Wound Measurement - multiple wounds 1 5 []  -  Simple Wound Cleansing - one wound 0 X - Complex Wound Cleansing - multiple wounds 1 5 INTERVENTIONS - Wound Dressings []  - Small Wound Dressing one or multiple wounds 0 X - Medium Wound Dressing one or multiple wounds 1 15 []  - Large Wound Dressing one or multiple wounds 0 []  - Application of Medications - injection 0 INTERVENTIONS - Miscellaneous []  - External ear exam 0 []  - Specimen Collection (cultures, biopsies, blood, body fluids, etc.) 0 []  - Specimen(s) / Culture(s) sent or taken to Lab for analysis 0 []  - Patient Transfer (multiple staff / Civil Service fast streamer / Similar devices) 0 []  - Simple Staple / Suture removal (25 or less) 0 []  - Complex Staple / Suture removal (26 or more) 0 [name], [name] ([id number]) []  - Hypo / Hyperglycemic Management (close monitor of Blood Glucose) 0 []  - Ankle / Brachial Index (ABI) - do not check if billed separately 0 Has the patient been seen at the hospital within the last three years: Yes Total Score: 90 Level Of Care: New/Established - Level 3 Electronic Signature(s) Signed: 06/24/2015 5:21:15 PM By: Gretta Cool, RN, BSN, Kim RN, BSN Entered By: Gretta Cool, RN, BSN, Kim on 06/24/2015 14:41:23 [name], [name] ([id number]) -------------------------------------------------------------------------------- Encounter Discharge Information Details Patient Name: [name], [name] Date of Service: 06/24/2015 1:00 PM Medical Record Number: [id number] Patient Account Number: [id number] Date of Birth/Sex: [birth year] (54 y.o. Male) Treating RN: Cornell Barman Primary Care Physician: [name] Other Clinician: Referring Physician: Forest Gleason Treating Physician/Extender: Frann Rider in Treatment: 0 Encounter  Discharge Information Items Discharge Pain Level: 0 Discharge Condition: Stable Ambulatory Status: Stable Discharge Destination: Home Transportation: Private Auto Accompanied By: wife Schedule Follow-up Appointment: Yes Medication Reconciliation completed and provided to Patient/Care Yes Lennis Korb: Provided on Clinical Summary of Care: 06/24/2015 Form Type Recipient Paper Patient [place] Signature(s) Signed: 06/24/2015 2:32:41 PM By: Ruthine Dose Entered By: Ruthine Dose on 06/24/2015 14:32:41 Mundorf, [place]. ([id number]) -------------------------------------------------------------------------------- Multi Wound Chart Details Patient Name: [name], [name] Date of Service: 06/24/2015 1:00 PM Medical Record Number: [id number] Patient Account Number: [id number] Date of Birth/Sex: 1960-11-09 (54 y.o. Male) Treating RN: Cornell Barman Primary Care Physician: [name] Other Clinician: Referring Physician: Forest Gleason Treating Physician/Extender: Frann Rider in Treatment: 0 Vital Signs Height(in): 71 Pulse(bpm): 90 Weight(lbs): 274 Blood Pressure 132/75 (mmHg): Body Mass Index(BMI): 38 Temperature(F): 98.2 Respiratory Rate 18 (breaths/min): Photos: [1:No Photos] [N/A:N/A] Wound Location: [1:Abdomen - midline - Medial, Anterior] [N/A:N/A] Wounding Event: [1:Surgical Injury] [N/A:N/A] Primary Etiology: [1:Dehisced Wound] [N/A:N/A] Comorbid History: [1:Anemia, Osteoarthritis, Neuropathy, Received Chemotherapy] [N/A:N/A] Date Acquired: [1:05/07/2015] [N/A:N/A] Weeks of Treatment: [1:0] [N/A:N/A] Wound Status: [1:Open] [N/A:N/A] Measurements L x W x D 5.5x1x0.8 [N/A:N/A] (cm) Area (cm) : [1:4.32] [N/A:N/A] Volume (cm) : [1:3.456] [N/A:N/A] % Reduction in Area: [1:0.00%] [N/A:N/A] % Reduction in Volume: 0.00% [N/A:N/A] Classification: [1:Full Thickness Without Exposed Support Structures] [N/A:N/A] Exudate Amount: [1:Small] [N/A:N/A] Exudate Type:  [1:Serous] [N/A:N/A] Exudate Color: [1:amber] [N/A:N/A] Wound Margin: [1:Flat and Intact] [N/A:N/A] Granulation Amount: [1:Small (1-33%)] [N/A:N/A] Necrotic Amount: [1:Large (67-100%)] [N/A:N/A] Exposed Structures: [1:Fascia: No Fat: No Tendon: No Muscle: No] [N/A:N/A] Joint: No Bone: No Limited to Skin Breakdown Periwound Skin Texture: Scarring: Yes N/A N/A Edema: No Excoriation: No Induration: No Callus: No Crepitus: No Fluctuance: No Friable: No Rash: No Periwound Skin Moist: Yes N/A N/A Moisture: Maceration: No Dry/Scaly: No Periwound Skin Color: Atrophie Blanche: No N/A N/A Cyanosis: No Ecchymosis: No Erythema: No Hemosiderin Staining: No Mottled: No Pallor: No Rubor: No Temperature: No Abnormality  N/A N/A Tenderness on Yes N/A N/A Palpation: Wound Preparation: Ulcer Cleansing: N/A N/A Rinsed/Irrigated with Saline Topical Anesthetic Applied: Other: lidociane 4% Treatment Notes Electronic Signature(s) Signed: 06/24/2015 5:21:15 PM By: Gretta Cool, RN, BSN, Kim RN, BSN Entered By: Gretta Cool, RN, BSN, Kim on 06/24/2015 13:50:30 Shreffler, [name] ([id number]) -------------------------------------------------------------------------------- Multi-Disciplinary Care Plan Details Patient Name: [name], [name] Date of Service: 06/24/2015 1:00 PM Medical Record Number: [id number] Patient Account Number: [id number] Date of Birth/Sex: [birth year] (54 y.o. Male) Treating RN: Cornell Barman Primary Care Physician: [name] Other Clinician: Referring Physician: Forest Gleason Treating Physician/Extender: Frann Rider in Treatment: 0 Active Inactive Orientation to the Wound Care Program Nursing Diagnoses: Knowledge deficit related to the wound healing center program Goals: Patient/caregiver will verbalize understanding of the [facility] Program Date Initiated: 06/24/2015 Goal Status: Active Interventions: Provide education on orientation to the wound  center Notes: Wound/Skin Impairment Nursing Diagnoses: Knowledge deficit related to ulceration/compromised skin integrity Goals: Ulcer/skin breakdown will heal within 14 weeks Date Initiated: 06/24/2015 Goal Status: Active Interventions: Provide education on ulcer and skin care Treatment Activities: Topical wound management initiated : 06/24/2015 Notes: Electronic Signature(s) Signed: 06/24/2015 5:21:15 PM By: Gretta Cool, RN, BSN, Kim RN, BSN Entered By: Gretta Cool, RN, BSN, Kim on 06/24/2015 13:50:10 [name], [name] ([id number]) [name], [name] [place]. ([id number]) -------------------------------------------------------------------------------- Pain Assessment Details Patient Name: [name], [name] Date of Service: 06/24/2015 1:00 PM Medical Record Number: [id number] Patient Account Number: [id number] Date of Birth/Sex: 09/21/1960 (54 y.o. Male) Treating RN: Cornell Barman Primary Care Physician: [name] Other Clinician: Referring Physician: Forest Gleason Treating Physician/Extender: Frann Rider in Treatment: 0 Active Problems Location of Pain Severity and Description of Pain Patient Has Paino Yes Site Locations Pain Location: Generalized Pain Pain Management and Medication Current Pain Management: Notes Patient has pain all over, currently doing chemotherapy Electronic Signature(s) Signed: 06/24/2015 5:21:15 PM By: Gretta Cool, RN, BSN, Kim RN, BSN Entered By: Gretta Cool, RN, BSN, Kim on 06/24/2015 13:15:38 [name], [name] ([id number]) -------------------------------------------------------------------------------- Patient/Caregiver Education Details Patient Name: [name], [name] Date of Service: 06/24/2015 1:00 PM Medical Record Number: [id number] Patient Account Number: [id number] Date of Birth/Gender: 01-01-1961 (54 y.o. Male) Treating RN: Cornell Barman Primary Care Physician: [name] Other Clinician: Referring Physician: Forest Gleason Treating Physician/Extender: Frann Rider in Treatment: 0 Education Assessment Education Provided To: Patient Education Topics Provided Wound/Skin Impairment: Handouts: Caring for Your Ulcer, Other: wound cre as prescribed Methods: Demonstration Responses: State content correctly Electronic Signature(s) Signed: 06/24/2015 5:21:15 PM By: Gretta Cool, RN, BSN, Kim RN, BSN Entered By: Gretta Cool, RN, BSN, Kim on 06/24/2015 14:10:12 [name], [name] ([id number]) -------------------------------------------------------------------------------- Wound Assessment Details Patient Name: Wojtas, Alejandro C. Date of Service: 06/24/2015 1:00 PM Medical Record Number: [id number] Patient Account Number: [id number] Date of Birth/Sex: 1961/09/08 (54 y.o. Male) Treating RN: Cornell Barman Primary Care Physician: [name] Other Clinician: Referring Physician: Forest Gleason Treating Physician/Extender: Frann Rider in Treatment: 0 Wound Status Wound Number: 1 Primary Dehisced Wound Etiology: Wound Location: Abdomen - midline - Medial, Anterior Wound Open Status: Wounding Event: Surgical Injury Comorbid Anemia, Osteoarthritis, Neuropathy, Date Acquired: 05/07/2015 History: Received Chemotherapy Weeks Of Treatment: 0 Clustered Wound: No Photos Photo Uploaded By: Gretta Cool, RN, BSN, Kim on 06/24/2015 17:51:49 Wound Measurements Length: (cm) 5.5 Width: (cm) 1 Depth: (cm) 0.8 Area: (cm) 4.32 Volume: (cm) 3.456 % Reduction in Area: 0% % Reduction in Volume: 0% Tunneling: Yes Wound Description Full Thickness Without Exposed Classification: Support Structures Wound Margin: Flat and Intact Small Torok, Viktor C. ([id number]) Exudate  Amount: Exudate Type: Serous Exudate Color: amber Wound Bed Granulation Amount: Small (1-33%) Exposed Structure Necrotic Amount: Large (67-100%) Fascia Exposed: No Necrotic Quality: Adherent Slough Fat Layer Exposed: No Tendon Exposed: No Muscle Exposed: No Joint Exposed: No Bone Exposed:  No Limited to Skin Breakdown Periwound Skin Texture Texture Color No Abnormalities Noted: No No Abnormalities Noted: No Callus: No Atrophie Blanche: No Crepitus: No Cyanosis: No Excoriation: No Ecchymosis: No Fluctuance: No Erythema: No Friable: No Hemosiderin Staining: No Induration: No Mottled: No Localized Edema: No Pallor: No Rash: No Rubor: No Scarring: Yes Temperature / Pain Moisture Temperature: No Abnormality No Abnormalities Noted: No Tenderness on Palpation: Yes Dry / Scaly: No Maceration: No Moist: Yes Wound Preparation Ulcer Cleansing: Rinsed/Irrigated with Saline Topical Anesthetic Applied: Other: lidociane 4%, Assessment Notes Three areas with depth: Top 3.5 cm; Middle 3.8 cm; bottom 1cm Treatment Notes Wound #1 (Medial, Anterior Abdomen - midline) 1. Cleansed with: Clean wound with Normal Saline 2. Anesthetic Topical Lidocaine 4% cream to wound bed prior to debridement 4. Dressing Applied: Casavant, Oaklee C. ([id number]) Iodoform packing Gauze 5. Secondary Dressing Applied Bordered Foam Dressing Electronic Signature(s) Signed: 06/24/2015 5:21:15 PM By: Gretta Cool, RN, BSN, Kim RN, BSN Entered By: Gretta Cool, RN, BSN, Kim on 06/24/2015 14:34:51 Recendez, [name] ([id number]) -------------------------------------------------------------------------------- Vitals Details Patient Name: Craigo, Kahle C. Date of Service: 06/24/2015 1:00 PM Medical Record Number: [id number] Patient Account Number: [id number] Date of Birth/Sex: Jun 01, 1961 (54 y.o. Male) Treating RN: Cornell Barman Primary Care Physician: [name] Other Clinician: Referring Physician: Forest Gleason Treating Physician/Extender: Frann Rider in Treatment: 0 Vital Signs Time Taken: 13:15 Temperature (F): 98.2 Height (in): 71 Pulse (bpm): 90 Weight (lbs): 274 Respiratory Rate (breaths/min): 18 Body Mass Index (BMI): 38.2 Blood Pressure (mmHg): 132/75 Reference Range: 80 - 120 mg /  dl Electronic Signature(s) Signed: 06/24/2015 5:21:15 PM By: Gretta Cool, RN, BSN, Kim RN, BSN Entered By: Gretta Cool, RN, BSN, Kim on 06/24/2015 13:16:39

## 2015-06-25 NOTE — Progress Notes (Signed)
[name] ([id number]) Visit Report for 06/24/2015 Chief Complaint Document Details Patient Name: [name], [name] Date of Service: 06/24/2015 1:00 PM Medical Record Number: [id number] Patient Account Number: [id number] Date of Birth/Sex: September 03, 1961 (54 y.o. Male) Treating RN: Cornell Barman Primary Care Physician: [name] Other Clinician: Referring Physician: Forest Gleason Treating Physician/Extender: Frann Rider in Treatment: 0 Information Obtained from: Patient Chief Complaint Patient presents to the wound care center for a consult due non healing wound of the abdomen wall which she's had for the last 7 weeks. Electronic Signature(s) Signed: 06/24/2015 2:25:31 PM By: Christin Fudge MD, FACS Entered By: Christin Fudge on 06/24/2015 14:25:31 [name] ([id number]) -------------------------------------------------------------------------------- HPI Details Patient Name: [name], [name] Date of Service: 06/24/2015 1:00 PM Medical Record Number: [id number] Patient Account Number: [id number] Date of Birth/Sex: 1961-05-13 (54 y.o. Male) Treating RN: Cornell Barman Primary Care Physician: [name] Other Clinician: Referring Physician: Forest Gleason Treating Physician/Extender: Frann Rider in Treatment: 0 History of Present Illness Location: area in the midline where he had a surgical scar which is draining fluid. Quality: Patient reports experiencing a sharp pain to affected area(s). Severity: Patient states wound are getting worse. Duration: Patient has had the wound for > 2 months prior to seeking treatment at the wound center Timing: Pain in wound is Intermittent (comes and goes Context: The wound occurred when the patient had his staples removed and there was drainage from that area. Modifying Factors: Other treatment(s) tried include:he has been putting a dry gauze dressing to that area. Associated Signs and Symptoms: Patient reports having difficulty  standing for long periods. HPI Description: 54 year old gentleman who was sent as from Dr. Metro Kung office for a nonhealing surgical wound which she's had for about 7 weeks. 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 He does not have an appointment to see his surgeon for the next 2 weeks. at present he is undergoing chemotherapy and has nausea and light headedness on and off with severe amount of pain in the abdomen. Electronic Signature(s) Signed: 06/24/2015 2:40:25 PM By: Christin Fudge MD, FACS Entered By: Christin Fudge on 06/24/2015 14:40:25 [name] ([id number]) -------------------------------------------------------------------------------- Physical Exam Details Patient Name: [name], [name] Date of Service: 06/24/2015 1:00 PM Medical Record Number: [id number] Patient Account Number: [id number] Date of Birth/Sex: March 14, 1961 (54 y.o. Male) Treating RN: Cornell Barman Primary Care Physician: [name] Other Clinician: Referring Physician: Forest Gleason Treating Physician/Extender: Frann Rider in Treatment: 0 Constitutional . Pulse regular. Respirations normal and unlabored. Afebrile. . Eyes Nonicteric. Reactive to light. Ears, Nose, Mouth, and Throat Lips, teeth, and gums WNL.Marland Kitchen Moist mucosa without lesions . Neck supple and nontender. No palpable supraclavicular or cervical adenopathy. Normal sized without goiter. Respiratory WNL. No retractions.. Cardiovascular Pedal Pulses WNL. No clubbing, cyanosis or edema. Genitourinary (GU) No hydrocele, spermatocele, tenderness of the cord, or testicular mass.Marland Kitchen Penis without lesions.[name] without lesions. No cystocele, or rectocele. Pelvic support intact, no discharge. Marland Kitchen Urethra without masses, tenderness or scarring.Marland Kitchen Lymphatic No adneopathy. No adenopathy. No  adenopathy. Musculoskeletal Adexa without tenderness or enlargement.. Digits and nails w/o clubbing, cyanosis, infection, petechiae, ischemia, or inflammatory conditions.. Integumentary (Hair, Skin) No suspicious lesions. No crepitus or fluctuance. No peri-wound warmth or erythema. No masses.Marland Kitchen Psychiatric Judgement and insight Intact.. No evidence of depression, anxiety, or agitation.. Notes The wound in the midline and has 3  distinct open areas with sinus tracts. the depth ranges between 3-3.5 cm and the most inferior sinus tract is extremely tender. There is seen serosanguineous material and no frank pus of fecal material. Electronic Signature(s) Signed: 06/24/2015 2:42:13 PM By: Christin Fudge MD, FACS Entered By: Christin Fudge on 06/24/2015 14:42:12 [name] ([id number]) -------------------------------------------------------------------------------- Physician Orders Details Patient Name: [name], [name] Date of Service: 06/24/2015 1:00 PM Medical Record Number: [id number] Patient Account Number: [id number] Date of Birth/Sex: [birth year] (54 y.o. Male) Treating RN: Cornell Barman Primary Care Physician: [name] Other Clinician: Referring Physician: Forest Gleason Treating Physician/Extender: Frann Rider in Treatment: 0 Verbal / Phone Orders: Yes Clinician: Cornell Barman Read Back and Verified: Yes Diagnosis Coding Wound Cleansing Wound #1 Medial,Anterior Abdomen - midline o Clean wound with Normal Saline. Anesthetic Wound #1 Medial,Anterior Abdomen - midline o Topical Lidocaine 4% cream applied to wound bed prior to debridement Primary Wound Dressing Wound #1 Medial,Anterior Abdomen - midline o Iodoform packing Gauze - Packed lightly into 3 open areas with depth. Secondary Dressing Wound #1 Medial,Anterior Abdomen - midline o Boardered Foam Dressing Dressing Change Frequency Wound #1 Medial,Anterior Abdomen - midline o Change dressing every  day. Follow-up Appointments Wound #1 Medial,Anterior Abdomen - midline o Return Appointment in 1 week. Electronic Signature(s) Signed: 06/24/2015 4:43:21 PM By: Christin Fudge MD, FACS Signed: 06/24/2015 5:21:15 PM By: Gretta Cool RN, BSN, Kim RN, BSN Entered By: Gretta Cool, RN, BSN, Kim on 06/24/2015 14:41:04 Roppolo, [name] ([id number]) -------------------------------------------------------------------------------- Problem List Details Patient Name: Wurth, [name] Date of Service: 06/24/2015 1:00 PM Medical Record Number: [id number] Patient Account Number: [id number] Date of Birth/Sex: 29-Jul-1961 (54 y.o. Male) Treating RN: Cornell Barman Primary Care Physician: [name] Other Clinician: Referring Physician: Forest Gleason Treating Physician/Extender: Frann Rider in Treatment: 0 Active Problems ICD-10 Encounter Code Description Active Date Diagnosis S31.105A Unspecified open wound of abdominal wall, periumbilic 50/27/7412 Yes region without penetration into peritoneal cavity, initial encounter T81.31XA Disruption of external operation (surgical) wound, not 06/24/2015 Yes elsewhere classified, initial encounter C18.6 Malignant neoplasm of descending colon 06/24/2015 Yes Z92.21 Personal history of antineoplastic chemotherapy 06/24/2015 Yes Inactive Problems Resolved Problems Electronic Signature(s) Signed: 06/24/2015 2:25:08 PM By: Christin Fudge MD, FACS Entered By: Christin Fudge on 06/24/2015 14:25:08 Misiaszek, [name] ([id number]) -------------------------------------------------------------------------------- Progress Note Details Patient Name: Delker, [name] Date of Service: 06/24/2015 1:00 PM Medical Record Number: [id number] Patient Account Number: [id number] Date of Birth/Sex: [birth year] (54 y.o. Male) Treating RN: Cornell Barman Primary Care Physician: [name] Other Clinician: Referring Physician: Forest Gleason Treating Physician/Extender: Frann Rider in  Treatment: 0 Subjective Chief Complaint Information obtained from Patient Patient presents to the wound care center for a consult due non healing wound of the abdomen wall which she's had for the last 7 weeks. History of Present Illness (HPI) The following HPI elements were documented for the patient's wound: Location: area in the midline where he had a surgical scar which is draining fluid. Quality: Patient reports experiencing a sharp pain to affected area(s). Severity: Patient states wound are getting worse. Duration: Patient has had the wound for > 2 months prior to seeking treatment at the wound center Timing: Pain in wound is Intermittent (comes and goes Context: The wound occurred when the patient had his staples removed and there was drainage from that area. Modifying Factors: Other treatment(s) tried include:he has been putting a dry gauze dressing to that area. Associated Signs and Symptoms: Patient reports having difficulty standing for long periods. 54 year old gentleman who  was sent as from Dr. Metro Kung office for a nonhealing surgical wound which she's had for about 7 weeks. 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 He does not have an appointment to see his surgeon for the next 2 weeks. at present he is undergoing chemotherapy and has nausea and light headedness on and off with severe amount of pain in the abdomen. Wound History Patient presents with 1 open wound that has been present for approximately 7weeks. Patient has been treating wound in the following manner: dry dressing. Laboratory tests have been performed in the last month. Patient reportedly has not tested positive for an antibiotic resistant organism. Patient reportedly has not tested positive for osteomyelitis. Patient reportedly has not had testing performed to evaluate  circulation in the legs. Patient History Information obtained from Patient. Allergies No Known Drug Allergies Jurich, [name] ([id number]) Family History Diabetes - Father, Hypertension - Siblings, Stroke - Father, No family history of Cancer, Heart Disease, Kidney Disease, Lung Disease, Seizures, Thyroid Problems. Social History Never smoker - parents smoked heavily, Marital Status - Married, Alcohol Use - Never, Drug Use - No History, Caffeine Use - Rarely. Medical History Eyes Denies history of Cataracts, Glaucoma, Optic Neuritis Ear/Nose/Mouth/Throat Denies history of Chronic sinus problems/congestion, Middle ear problems Hematologic/Lymphatic Patient has history of Anemia Denies history of Hemophilia, Human Immunodeficiency Virus, Lymphedema, Sickle Cell Disease Respiratory Denies history of Aspiration, Asthma, Chronic Obstructive Pulmonary Disease (COPD), Pneumothorax, Sleep Apnea, Tuberculosis Cardiovascular Denies history of Angina, Arrhythmia, Congestive Heart Failure, Coronary Artery Disease, Deep Vein Thrombosis, Hypertension, Hypotension, Myocardial Infarction, Peripheral Arterial Disease, Peripheral Venous Disease, Phlebitis, Vasculitis Gastrointestinal Denies history of Cirrhosis , Colitis, Crohn s, Hepatitis A, Hepatitis B Endocrine Denies history of Type I Diabetes, Type II Diabetes Genitourinary Denies history of End Stage Renal Disease Immunological Denies history of Lupus Erythematosus, Raynaud s, Scleroderma Integumentary (Skin) Denies history of History of Burn, History of pressure wounds Musculoskeletal Patient has history of Osteoarthritis - neck Denies history of Gout, Rheumatoid Arthritis, Osteomyelitis Neurologic Patient has history of Neuropathy Denies history of Dementia, Quadriplegia, Paraplegia, Seizure Disorder Oncologic Patient has history of Received Chemotherapy - Currently Psychiatric Denies history of Anorexia/bulimia, Confinement  Anxiety Hospitalization/Surgery History - 05/07/2015, [facility], colon surgery. Medical And Surgical History Notes Constitutional Symptoms (General Health) Sarcodosis; Chemotherapy for colon cancer; colostomy; neuropathy, head trauma fro accident/falls, staff infection, slow healing Bartow, [name] ([id number]) Review of Systems (ROS) Constitutional Symptoms ([place]) Complains or has symptoms of Fatigue, Fever, Chills, Marked Weight Change. Eyes The patient has no complaints or symptoms. Ear/Nose/Mouth/Throat The patient has no complaints or symptoms. Hematologic/Lymphatic The patient has no complaints or symptoms. Respiratory The patient has no complaints or symptoms. Cardiovascular The patient has no complaints or symptoms. Gastrointestinal The patient has no complaints or symptoms. Endocrine Denies complaints or symptoms of Hepatitis, Thyroid disease, Polydypsia (Excessive Thirst). Genitourinary The patient has no complaints or symptoms. Immunological The patient has no complaints or symptoms. Integumentary (Skin) Complains or has symptoms of Wounds, Bleeding or bruising tendency, Breakdown. Denies complaints or symptoms of Swelling. Musculoskeletal Complains or has symptoms of Muscle Pain, Muscle Weakness. Neurologic The patient has no complaints or symptoms. Oncologic The patient has no complaints or symptoms, colon cancer currently receiving (3 weeks Monday 2 treatments (bi-weekly) Psychiatric The patient has no complaints or symptoms. Medications fentanyl 12 mcg/hr transdermal patch transdermal 1  1 patch 72 hour transdermal hydrocodone 5 mg-acetaminophen 325 mg tablet oral tablet oral ondansetron 4 mg disintegrating tablet oral 1 1 tablet,disintegrating oral Ambien 10 mg tablet oral 1 1 tablet oral lidocaine-prilocaine 2.5 %-2.5 % topical cream topical cream topical Beedy, [name] ([id number]) Objective Constitutional Pulse regular. Respirations normal  and unlabored. Afebrile. Vitals Time Taken: 1:15 PM, Height: 71 in, Weight: 274 lbs, BMI: 38.2, Temperature: 98.2 F, Pulse: 90 bpm, Respiratory Rate: 18 breaths/min, Blood Pressure: 132/75 mmHg. Eyes Nonicteric. Reactive to light. Ears, Nose, Mouth, and Throat Lips, teeth, and gums WNL.Marland Kitchen Moist mucosa without lesions . Neck supple and nontender. No palpable supraclavicular or cervical adenopathy. Normal sized without goiter. Respiratory WNL. No retractions.. Cardiovascular Pedal Pulses WNL. No clubbing, cyanosis or edema. Genitourinary (GU) No hydrocele, spermatocele, tenderness of the cord, or testicular mass.Marland Kitchen Penis without lesions.[name] without lesions. No cystocele, or rectocele. Pelvic support intact, no discharge. Marland Kitchen Urethra without masses, tenderness or scarring.Marland Kitchen Lymphatic No adneopathy. No adenopathy. No adenopathy. Musculoskeletal Adexa without tenderness or enlargement.. Digits and nails w/o clubbing, cyanosis, infection, petechiae, ischemia, or inflammatory conditions.Marland Kitchen Psychiatric Judgement and insight Intact.. No evidence of depression, anxiety, or agitation.. General Notes: The wound in the midline and has 3 distinct open areas with sinus tracts. the depth ranges between 3-3.5 cm and the most inferior sinus tract is extremely tender. There is seen serosanguineous material and no frank pus of fecal material. Integumentary (Hair, Skin) No suspicious lesions. No crepitus or fluctuance. No peri-wound warmth or erythema. No masses.. Wound #1 status is Open. Original cause of wound was Surgical Injury. The wound is located on the Medial,Anterior Abdomen - midline. The wound measures 5.5cm length x 1cm width x 0.8cm depth; Abdella, [name] ([id number]) 4.32cm^2 area and 3.456cm^3 volume. The wound is limited to skin breakdown. There is tunneling at :00. There is a small amount of serous drainage noted. The wound margin is flat and intact. There is small (1-33%)  granulation within the wound bed. There is a large (67-100%) amount of necrotic tissue within the wound bed including Adherent Slough. The periwound skin appearance exhibited: Scarring, Moist. The periwound skin appearance did not exhibit: Callus, Crepitus, Excoriation, Fluctuance, Friable, Induration, Localized Edema, Rash, Dry/Scaly, Maceration, Atrophie Blanche, Cyanosis, Ecchymosis, Hemosiderin Staining, Mottled, Pallor, Rubor, Erythema. Periwound temperature was noted as No Abnormality. The periwound has tenderness on palpation. General Notes: Three areas with depth: Top 3.5 cm; Middle 3.8 cm; bottom 1cm During the course of his hospital visits the patient became suddenly very nauseous and broke down in a sweat but his vital signs remained stable throughout. He was alert and oriented at all times and felt better after laying down for a while. Assessment Active Problems ICD-10 S31.105A - Unspecified open wound of abdominal wall, periumbilic region without penetration into peritoneal cavity, initial encounter T81.31XA - Disruption of external operation (surgical) wound, not elsewhere classified, initial encounter C18.6 - Malignant neoplasm of descending colon Z92.21 - Personal history of antineoplastic chemotherapy This gentleman has had a nonhealing surgical wound for about 7 weeks now with 3 distinct sinus tracts which have a depth about 3.5 cm. I have recommended packing with quarter-inch iodoform gauze gently and applying a foam dressing over this. The patient has also been instructed to call his surgeon and ask for an appointment so that management of this can be further discussed with his attending surgeon. We would be happy to see him in follow-up with appropriate changes to his wound care as needed. Nutrition, vitamin supplements  and other symptomatic care has been discussed with him and his wife. Plan Wound Cleansing: Wound #1 Medial,Anterior Abdomen - midline: Clean wound with  Normal Saline. Anesthetic: Fehrenbach, [name] ([id number]) Wound #1 Medial,Anterior Abdomen - midline: Topical Lidocaine 4% cream applied to wound bed prior to debridement Primary Wound Dressing: Wound #1 Medial,Anterior Abdomen - midline: Iodoform packing Gauze - Packed lightly into 3 open areas with depth. Secondary Dressing: Wound #1 Medial,Anterior Abdomen - midline: Boardered Foam Dressing Dressing Change Frequency: Wound #1 Medial,Anterior Abdomen - midline: Change dressing every day. Follow-up Appointments: Wound #1 Medial,Anterior Abdomen - midline: Return Appointment in 1 week. This gentleman has had a nonhealing surgical wound for about 7 weeks now with 3 distinct sinus tracts which have a depth about 3.5 cm. I have recommended packing with quarter-inch iodoform gauze gently and applying a foam dressing over this. The patient has also been instructed to call his surgeon and ask for an appointment so that management of this can be further discussed with his attending surgeon. We would be happy to see him in follow-up with appropriate changes to his wound care as needed. Nutrition, vitamin supplements and other symptomatic care has been discussed with him and his wife. Electronic Signature(s) Signed: 06/24/2015 2:46:10 PM By: Christin Fudge MD, FACS Entered By: Christin Fudge on 06/24/2015 14:46:10 Craigo, [name] ([id number]) -------------------------------------------------------------------------------- ROS/PFSH Details Patient Name: Kurtz, [name] Date of Service: 06/24/2015 1:00 PM Medical Record Number: [id number] Patient Account Number: [id number] Date of Birth/Sex: Jan 19, 1961 (54 y.o. Male) Treating RN: Cornell Barman Primary Care Physician: [name] Other Clinician: Referring Physician: Forest Gleason Treating Physician/Extender: Frann Rider in Treatment: 0 Information Obtained From Patient Wound History Do you currently have one or more open woundso Yes How  many open wounds do you currently haveo 1 Approximately how long have you had your woundso 7weeks How have you been treating your wound(s) until nowo dry dressing Has your wound(s) ever healed and then re-openedo No Have you had any lab work done in the past montho Yes Have you tested positive for an antibiotic resistant organism (MRSA, VRE)o No Have you tested positive for osteomyelitis (bone infection)o No Have you had any tests for circulation on your legso No Constitutional Symptoms (General Health) Complaints and Symptoms: Positive for: Fatigue; Fever; Chills; Marked Weight Change Medical History: Past Medical History Notes: Sarcodosis; Chemotherapy for colon cancer; colostomy; neuropathy, head trauma fro accident/falls, staff infection, slow healing Gastrointestinal Complaints and Symptoms: No Complaints or Symptoms Complaints and Symptoms: Negative for: Frequent diarrhea; Nausea Medical History: Negative for: Cirrhosis ; Colitis; Crohnos; Hepatitis A; Hepatitis B Endocrine Complaints and Symptoms: Negative for: Hepatitis; Thyroid disease; Polydypsia (Excessive Thirst) Medical History: Negative for: Type I Diabetes; Type II Diabetes Raetz, [name] ([id number]) Integumentary (Skin) Complaints and Symptoms: Positive for: Wounds; Bleeding or bruising tendency; Breakdown Negative for: Swelling Medical History: Negative for: History of Burn; History of pressure wounds Musculoskeletal Complaints and Symptoms: Positive for: Muscle Pain; Muscle Weakness Medical History: Positive for: Osteoarthritis - neck Negative for: Gout; Rheumatoid Arthritis; Osteomyelitis Eyes Complaints and Symptoms: No Complaints or Symptoms Medical History: Negative for: Cataracts; Glaucoma; Optic Neuritis Ear/Nose/Mouth/Throat Complaints and Symptoms: No Complaints or Symptoms Medical History: Negative for: Chronic sinus problems/congestion; Middle ear problems Hematologic/Lymphatic Complaints  and Symptoms: No Complaints or Symptoms Medical History: Positive for: Anemia Negative for: Hemophilia; Human Immunodeficiency Virus; Lymphedema; Sickle Cell Disease Respiratory Complaints and Symptoms: No Complaints or Symptoms Medical History: Negative for: Aspiration; Asthma; Chronic Obstructive Pulmonary Disease (COPD); Pneumothorax;  Sleep Apnea; Tuberculosis Hubbert, [name] ([id number]) Cardiovascular Complaints and Symptoms: No Complaints or Symptoms Medical History: Negative for: Angina; Arrhythmia; Congestive Heart Failure; Coronary Artery Disease; Deep Vein Thrombosis; Hypertension; Hypotension; Myocardial Infarction; Peripheral Arterial Disease; Peripheral Venous Disease; Phlebitis; Vasculitis Genitourinary Complaints and Symptoms: No Complaints or Symptoms Medical History: Negative for: End Stage Renal Disease Immunological Complaints and Symptoms: No Complaints or Symptoms Medical History: Negative for: Lupus Erythematosus; Raynaudos; Scleroderma Neurologic Complaints and Symptoms: No Complaints or Symptoms Medical History: Positive for: Neuropathy Negative for: Dementia; Quadriplegia; Paraplegia; Seizure Disorder Oncologic Complaints and Symptoms: No Complaints or Symptoms Complaints and Symptoms: Review of System Notes: colon cancer currently receiving (3 weeks Monday 2 treatments (bi-weekly) Medical History: Positive for: Received Chemotherapy - Currently Psychiatric Complaints and Symptoms: No Complaints or Symptoms Medical History: Szymborski, [name] Kitchen ([id number]) Negative for: Anorexia/bulimia; Confinement Anxiety Hospitalization / Surgery History Name of Hospital Purpose of Hospitalization/Surgery Date [facility] colon surgery 05/07/2015 Family and Social History Cancer: No; Diabetes: Yes - Father; Heart Disease: No; Hypertension: Yes - Siblings; Kidney Disease: No; Lung Disease: No; Seizures: No; Stroke: Yes - Father; Thyroid Problems: No; Never  smoker - parents smoked heavily; Marital Status - Married; Alcohol Use: Never; Drug Use: No History; Caffeine Use: Rarely; Living Will: Yes (Not Provided); Medical Power of Attorney: Yes (Not Provided) Physician Affirmation I have reviewed and agree with the above information. Electronic Signature(s) Signed: 06/24/2015 2:42:35 PM By: Christin Fudge MD, FACS Signed: 06/24/2015 5:21:15 PM By: Gretta Cool RN, BSN, Kim RN, BSN Previous Signature: 06/24/2015 1:31:34 PM Version By: Christin Fudge MD, FACS Entered By: Christin Fudge on 06/24/2015 14:42:34 Schatzman, [name] ([id number]) -------------------------------------------------------------------------------- SuperBill Details Patient Name: Stillings, [name] Date of Service: 06/24/2015 Medical Record Number: [id number] Patient Account Number: [id number] Date of Birth/Sex: 17-Mar-1961 (54 y.o. 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 Z92.21 Personal history of antineoplastic chemotherapy Facility Procedures CPT4 Code: 47425956 Description: 99213 - WOUND CARE VISIT-LEV 3 EST PT Modifier: Quantity: 1 Physician Procedures CPT4: Description Modifier Quantity Code 3875643 32951 - WC PHYS LEVEL 4 - NEW PT 1 ICD-10 Description Diagnosis S31.105A Unspecified open wound of abdominal wall, periumbilic region without penetration into peritoneal cavity, initial encounter T81.31XA  Disruption of external operation (surgical) wound, not elsewhere classified, initial encounter C18.6 Malignant  neoplasm of descending colon Z92.21 Personal history of antineoplastic chemotherapy Electronic Signature(s) Signed: 06/24/2015 2:46:32 PM By: Christin Fudge MD, FACS Entered By: Christin Fudge on 06/24/2015 14:46:32

## 2015-06-30 ENCOUNTER — Inpatient Hospital Stay: Payer: BLUE CROSS/BLUE SHIELD

## 2015-06-30 ENCOUNTER — Inpatient Hospital Stay (HOSPITAL_BASED_OUTPATIENT_CLINIC_OR_DEPARTMENT_OTHER): Payer: BLUE CROSS/BLUE SHIELD | Admitting: Oncology

## 2015-06-30 ENCOUNTER — Other Ambulatory Visit: Payer: Self-pay | Admitting: *Deleted

## 2015-06-30 VITALS — HR 84 | Temp 98.0°F | Wt 291.0 lb

## 2015-06-30 DIAGNOSIS — C186 Malignant neoplasm of descending colon: Secondary | ICD-10-CM

## 2015-06-30 DIAGNOSIS — Z79899 Other long term (current) drug therapy: Secondary | ICD-10-CM | POA: Diagnosis not present

## 2015-06-30 DIAGNOSIS — Z933 Colostomy status: Secondary | ICD-10-CM

## 2015-06-30 DIAGNOSIS — G893 Neoplasm related pain (acute) (chronic): Secondary | ICD-10-CM | POA: Diagnosis not present

## 2015-06-30 LAB — COMPREHENSIVE METABOLIC PANEL
ALBUMIN: 3.8 g/dL (ref 3.5–5.0)
ALK PHOS: 104 U/L (ref 38–126)
ALT: 30 U/L (ref 17–63)
ANION GAP: 8 (ref 5–15)
AST: 32 U/L (ref 15–41)
BUN: 12 mg/dL (ref 6–20)
CHLORIDE: 104 mmol/L (ref 101–111)
CO2: 25 mmol/L (ref 22–32)
CREATININE: 1.08 mg/dL (ref 0.61–1.24)
Calcium: 8.7 mg/dL — ABNORMAL LOW (ref 8.9–10.3)
GFR calc non Af Amer: >60 mL/min (ref >60)
GLUCOSE: 117 mg/dL — AB (ref 65–99)
Potassium: 3.6 mmol/L (ref 3.5–5.1)
SODIUM: 137 mmol/L (ref 135–145)
Total Bilirubin: 0.3 mg/dL (ref 0.3–1.2)
Total Protein: 7.4 g/dL (ref 6.5–8.1)

## 2015-06-30 LAB — CBC WITH DIFFERENTIAL/PLATELET
BASOS PCT: 1 %
Basophils Absolute: 0.1 10*3/uL (ref 0–0.1)
EOS ABS: 0.1 10*3/uL (ref 0–0.7)
EOS PCT: 1 %
HCT: 36 % — ABNORMAL LOW (ref 40.0–52.0)
HEMOGLOBIN: 11.5 g/dL — AB (ref 13.0–18.0)
LYMPHS ABS: 1.4 10*3/uL (ref 1.0–3.6)
Lymphocytes Relative: 16 %
MCH: 24.5 pg — AB (ref 26.0–34.0)
MCHC: 32 g/dL (ref 32.0–36.0)
MCV: 76.6 fL — ABNORMAL LOW (ref 80.0–100.0)
Monocytes Absolute: 0.9 10*3/uL (ref 0.2–1.0)
Monocytes Relative: 10 %
NEUTROS PCT: 72 %
Neutro Abs: 6.3 10*3/uL (ref 1.4–6.5)
PLATELETS: 263 10*3/uL (ref 150–440)
RBC: 4.7 MIL/uL (ref 4.40–5.90)
RDW: 23.9 % — ABNORMAL HIGH (ref 11.5–14.5)
WBC: 8.7 10*3/uL (ref 3.8–10.6)

## 2015-06-30 LAB — MAGNESIUM: Magnesium: 2.2 mg/dL (ref 1.7–2.4)

## 2015-06-30 MED ORDER — OXYCODONE HCL 5 MG PO TABS: 5.0000 mg | ORAL_TABLET | ORAL | Status: DC | PRN

## 2015-06-30 MED ORDER — SODIUM CHLORIDE 0.9 % IJ SOLN
10.0000 mL | Freq: Once | INTRAMUSCULAR | Status: AC
  Administered 2015-06-30: 10 mL
  Filled 2015-06-30: qty 10

## 2015-06-30 MED ORDER — LIDOCAINE-PRILOCAINE 2.5-2.5 % EX CREA
1.0000 | TOPICAL_CREAM | CUTANEOUS | Status: DC | PRN
Start: 2015-06-30 — End: 2016-03-23

## 2015-06-30 MED ORDER — FENTANYL 50 MCG/HR TD PT72: 50.0000 ug | MEDICATED_PATCH | TRANSDERMAL | Status: DC

## 2015-06-30 MED ORDER — HEPARIN SOD (PORK) LOCK FLUSH 100 UNIT/ML IV SOLN
500.0000 [IU] | Freq: Once | INTRAVENOUS | Status: AC
  Administered 2015-06-30: 500 [IU]

## 2015-07-03 ENCOUNTER — Encounter: Payer: BLUE CROSS/BLUE SHIELD | Admitting: Surgery

## 2015-07-03 ENCOUNTER — Ambulatory Visit: Payer: BLUE CROSS/BLUE SHIELD

## 2015-07-03 DIAGNOSIS — S31105A Unspecified open wound of abdominal wall, periumbilic region without penetration into peritoneal cavity, initial encounter: Secondary | ICD-10-CM | POA: Diagnosis not present

## 2015-07-03 NOTE — Progress Notes (Addendum)
CALYX, HAWKER ([id number]) Visit Report for 07/03/2015 Arrival Information Details Patient Name: [name], [name] Date of Service: 07/03/2015 11:30 AM Medical Record Number: [id number] Patient Account Number: [id number] Date of Birth/Sex: 10-07-1960 (54 y.o. Male) Treating RN: Baruch Gouty, RN, BSN, Velva Harman Primary Care Physician: [name] Other Clinician: Referring Physician: [name] Treating Physician/Extender: Frann Rider in Treatment: 1 Visit Information History Since Last Visit Any new allergies or adverse reactions: No Patient Arrived: Ambulatory Had a fall or experienced change in No Arrival Time: 11:37 activities of daily living that may affect Accompanied By: wife risk of falls: Transfer Assistance: None Signs or symptoms of abuse/neglect since last No Patient Identification Verified: Yes visito Secondary Verification Process Yes Hospitalized since last visit: No Completed: Has Dressing in Place as Prescribed: Yes Pain Present Now: No Electronic Signature(s) Signed: 07/03/2015 11:58:32 AM By: Regan Lemming BSN, RN Previous Signature: 07/03/2015 11:37:30 AM Version By: Regan Lemming BSN, RN Entered By: Regan Lemming on 07/03/2015 11:58:32 Naser, Daun Peacock ([id number]) -------------------------------------------------------------------------------- Clinic Level of Care Assessment Details Patient Name: Chauvin, Reggie C. Date of Service: 07/03/2015 11:30 AM Medical Record Number: [id number] Patient Account Number: [id number] Date of Birth/Sex: December 23, 1960 (54 y.o. Male) Treating RN: Baruch Gouty, RN, BSN, [place] Primary Care Physician: [name] Other Clinician: Referring Physician: [name] Treating Physician/Extender: Frann Rider in Treatment: 1 Clinic Level of Care Assessment Items TOOL 4 Quantity Score []  - Use when only an EandM is performed on FOLLOW-UP visit 0 ASSESSMENTS - Nursing Assessment / Reassessment X - Reassessment of Co-morbidities (includes  updates in patient status) 1 10 X - Reassessment of Adherence to Treatment Plan 1 5 ASSESSMENTS - Wound and Skin Assessment / Reassessment X - Simple Wound Assessment / Reassessment - one wound 1 5 []  - Complex Wound Assessment / Reassessment - multiple wounds 0 []  - Dermatologic / Skin Assessment (not related to wound area) 0 ASSESSMENTS - Focused Assessment []  - Circumferential Edema Measurements - multi extremities 0 []  - Nutritional Assessment / Counseling / Intervention 0 []  - Lower Extremity Assessment (monofilament, tuning fork, pulses) 0 []  - Peripheral Arterial Disease Assessment (using hand held doppler) 0 ASSESSMENTS - Ostomy and/or Continence Assessment and Care []  - Incontinence Assessment and Management 0 []  - Ostomy Care Assessment and Management (repouching, etc.) 0 PROCESS - Coordination of Care X - Simple Patient / Family Education for ongoing care 1 15 []  - Complex (extensive) Patient / Family Education for ongoing care 0 []  - Staff obtains Programmer, systems, Records, Test Results / Process Orders 0 []  - Staff telephones HHA, Nursing Homes / Clarify orders / etc 0 []  - Routine Transfer to another Facility (non-emergent condition) 0 Srinivasan, Amaziah C. ([id number]) []  - Routine Hospital Admission (non-emergent condition) 0 []  - New Admissions / Biomedical engineer / Ordering NPWT, Apligraf, etc. 0 []  - Emergency Hospital Admission (emergent condition) 0 []  - Simple Discharge Coordination 0 []  - Complex (extensive) Discharge Coordination 0 PROCESS - Special Needs []  - Pediatric / Minor Patient Management 0 []  - Isolation Patient Management 0 []  - Hearing / Language / Visual special needs 0 []  - Assessment of Community assistance (transportation, D/C planning, etc.) 0 []  - Additional assistance / Altered mentation 0 []  - Support Surface(s) Assessment (bed, cushion, seat, etc.) 0 INTERVENTIONS - Wound Cleansing / Measurement X - Simple Wound Cleansing - one wound 1 5 []  -  Complex Wound Cleansing - multiple wounds 0 X - Wound Imaging (photographs - any number of wounds) 1 5 []  -  Wound Tracing (instead of photographs) 0 []  - Simple Wound Measurement - one wound 0 []  - Complex Wound Measurement - multiple wounds 0 INTERVENTIONS - Wound Dressings X - Small Wound Dressing one or multiple wounds 1 10 []  - Medium Wound Dressing one or multiple wounds 0 []  - Large Wound Dressing one or multiple wounds 0 []  - Application of Medications - topical 0 []  - Application of Medications - injection 0 INTERVENTIONS - Miscellaneous []  - External ear exam 0 Memoli, Kelijah C. ([id number]) []  - Specimen Collection (cultures, biopsies, blood, body fluids, etc.) 0 []  - Specimen(s) / Culture(s) sent or taken to Lab for analysis 0 []  - Patient Transfer (multiple staff / Harrel Lemon Lift / Similar devices) 0 []  - Simple Staple / Suture removal (25 or less) 0 []  - Complex Staple / Suture removal (26 or more) 0 []  - Hypo / Hyperglycemic Management (close monitor of Blood Glucose) 0 []  - Ankle / Brachial Index (ABI) - do not check if billed separately 0 X - Vital Signs 1 5 Has the patient been seen at the hospital within the last three years: Yes Total Score: 60 Level Of Care: New/Established - Level 2 Electronic Signature(s) Signed: 07/03/2015 11:52:27 AM By: Regan Lemming BSN, RN Entered By: Regan Lemming on 07/03/2015 11:52:25 Eisen, Daun Peacock ([id number]) -------------------------------------------------------------------------------- Encounter Discharge Information Details Patient Name: Wyszynski, Catrell C. Date of Service: 07/03/2015 11:30 AM Medical Record Number: [id number] Patient Account Number: [id number] Date of Birth/Sex: [birth year] (54 y.o. Male) Treating RN: Baruch Gouty, RN, BSN, Velva Harman Primary Care Physician: [name] Other Clinician: Referring Physician: [name] Treating Physician/Extender: Frann Rider in Treatment: 1 Encounter Discharge Information Items Discharge  Pain Level: 0 Discharge Condition: Stable Ambulatory Status: Ambulatory Discharge Destination: Home Transportation: Private Auto Accompanied By: wife Schedule Follow-up Appointment: No Medication Reconciliation completed and provided to Patient/Care No Kassidy Dockendorf: Provided on Clinical Summary of Care: 07/03/2015 Form Type Recipient Paper Patient [facility] Electronic Signature(s) Signed: 07/03/2015 11:57:07 AM By: Regan Lemming BSN, RN Previous Signature: 07/03/2015 11:56:50 AM Version By: Ruthine Dose Entered By: Regan Lemming on 07/03/2015 11:57:07 Vanallen, Ernan Loletha Grayer ([id number]) -------------------------------------------------------------------------------- Lower Extremity Assessment Details Patient Name: Tennis, Kartier C. Date of Service: 07/03/2015 11:30 AM Medical Record Number: [id number] Patient Account Number: [id number] Date of Birth/Sex: 01-Feb-1961 (54 y.o. Male) Treating RN: Baruch Gouty, RN, BSN, Velva Harman Primary Care Physician: [name] Other Clinician: Referring Physician: [name] Treating Physician/Extender: Frann Rider in Treatment: 1 Electronic Signature(s) Signed: 07/03/2015 11:40:36 AM By: Regan Lemming BSN, RN Entered By: Regan Lemming on 07/03/2015 11:40:36 Thinnes, Daun Peacock ([id number]) -------------------------------------------------------------------------------- Multi Wound Chart Details Patient Name: Borras, [place] Date of Service: 07/03/2015 11:30 AM Medical Record Number: [id number] Patient Account Number: [id number] Date of Birth/Sex: 08/24/1961 (54 y.o. Male) Treating RN: Baruch Gouty, RN, BSN, Velva Harman Primary Care Physician: [name] Other Clinician: Referring Physician: [name] Treating Physician/Extender: Frann Rider in Treatment: 1 Vital Signs Height(in): 71 Pulse(bpm): 102 Weight(lbs): 274 Blood Pressure 138/89 (mmHg): Body Mass Index(BMI): 38 Temperature(F): 98.2 Respiratory Rate 20 (breaths/min): Photos: [1:No Photos]  [N/A:N/A] Wound Location: [1:Abdomen - midline - Medial, Anterior] [N/A:N/A] Wounding Event: [1:Surgical Injury] [N/A:N/A] Primary Etiology: [1:Dehisced Wound] [N/A:N/A] Comorbid History: [1:Anemia, Osteoarthritis, Neuropathy, Received Chemotherapy] [N/A:N/A] Date Acquired: [1:05/07/2015] [N/A:N/A] Weeks of Treatment: [1:1] [N/A:N/A] Wound Status: [1:Open] [N/A:N/A] Measurements L x W x D 3.5x1x0.8 [N/A:N/A] (cm) Area (cm) : [1:2.749] [N/A:N/A] Volume (cm) : [1:2.199] [N/A:N/A] % Reduction in Area: [1:36.40%] [N/A:N/A] % Reduction in Volume: 36.40% [N/A:N/A] Classification: [1:Full Thickness Without Exposed  Support Structures] [N/A:N/A] Exudate Amount: [1:Small] [N/A:N/A] Exudate Type: [1:Serous] [N/A:N/A] Exudate Color: [1:amber] [N/A:N/A] Wound Margin: [1:Flat and Intact] [N/A:N/A] Granulation Amount: [1:Large (67-100%)] [N/A:N/A] Granulation Quality: [1:Pink, Pale] [N/A:N/A] Necrotic Amount: [1:Small (1-33%)] [N/A:N/A] Exposed Structures: [1:Fascia: No Fat: No Tendon: No] [N/A:N/A] Muscle: No Joint: No Bone: No Limited to Skin Breakdown Periwound Skin Texture: Scarring: Yes N/A N/A Edema: No Excoriation: No Induration: No Callus: No Crepitus: No Fluctuance: No Friable: No Rash: No Periwound Skin Moist: Yes N/A N/A Moisture: Maceration: No Dry/Scaly: No Periwound Skin Color: Atrophie Blanche: No N/A N/A Cyanosis: No Ecchymosis: No Erythema: No Hemosiderin Staining: No Mottled: No Pallor: No Rubor: No Temperature: No Abnormality N/A N/A Tenderness on Yes N/A N/A Palpation: Wound Preparation: Ulcer Cleansing: N/A N/A Rinsed/Irrigated with Saline Topical Anesthetic Applied: Other: lidociane 4% Treatment Notes Electronic Signature(s) Signed: 07/03/2015 11:45:47 AM By: Regan Lemming BSN, RN Entered By: Regan Lemming on 07/03/2015 11:45:46 Sagen, Daun Peacock  ([id number]) -------------------------------------------------------------------------------- Multi-Disciplinary Care Plan Details Patient Name: Guadamuz, Quince C. Date of Service: 07/03/2015 11:30 AM Medical Record Number: [id number] Patient Account Number: [id number] Date of Birth/Sex: 1960-10-06 (54 y.o. Male) Treating RN: Baruch Gouty, RN, BSN, Velva Harman Primary Care Physician: [name] Other Clinician: Referring Physician: [name] Treating Physician/Extender: Frann Rider in Treatment: 1 Active Inactive Orientation to the Wound Care Program Nursing Diagnoses: Knowledge deficit related to the wound healing center program Goals: Patient/caregiver will verbalize understanding of the [facility] Date Initiated: 06/24/2015 Goal Status: Active Interventions: Provide education on orientation to the wound center Notes: Wound/Skin Impairment Nursing Diagnoses: Knowledge deficit related to ulceration/compromised skin integrity Goals: Ulcer/skin breakdown will heal within 14 weeks Date Initiated: 06/24/2015 Goal Status: Active Interventions: Provide education on ulcer and skin care Treatment Activities: Topical wound management initiated : 07/03/2015 Notes: Electronic Signature(s) Signed: 07/03/2015 11:43:32 AM By: Regan Lemming BSN, RN Entered By: Regan Lemming on 07/03/2015 11:43:31 Hargadon, Daun Peacock ([id number]) Graybeal, Boron ([id number]) -------------------------------------------------------------------------------- Pain Assessment Details Patient Name: Curtner, [place] Date of Service: 07/03/2015 11:30 AM Medical Record Number: [id number] Patient Account Number: [id number] Date of Birth/Sex: 06/13/1961 (54 y.o. Male) Treating RN: Baruch Gouty, RN, BSN, Velva Harman Primary Care Physician: [name] Other Clinician: Referring Physician: [name] Treating Physician/Extender: Frann Rider in Treatment: 1 Active Problems Location of Pain Severity and  Description of Pain Patient Has Paino Yes Site Locations Pain Location: Pain in Ulcers Rate the pain. Current Pain Level: 5 Character of Pain Describe the Pain: Tender Pain Management and Medication Current Pain Management: Electronic Signature(s) Signed: 07/03/2015 11:37:52 AM By: Regan Lemming BSN, RN Entered By: Regan Lemming on 07/03/2015 11:37:52 Music, Daun Peacock ([id number]) -------------------------------------------------------------------------------- Patient/Caregiver Education Details Patient Name: Shane, Felix C. Date of Service: 07/03/2015 11:30 AM Medical Record Number: [id number] Patient Account Number: [id number] Date of Birth/Gender: 06-07-1961 (54 y.o. Male) Treating RN: Baruch Gouty, RN, BSN, Velva Harman Primary Care Physician: [name] Other Clinician: Referring Physician: [name] Treating Physician/Extender: Frann Rider in Treatment: 1 Education Assessment Education Provided To: Patient Education Topics Provided Basic Hygiene: Methods: Explain/Verbal Responses: State content correctly [facility] Methods: Explain/Verbal Responses: State content correctly Wound/Skin Impairment: Methods: Explain/Verbal Responses: State content correctly Electronic Signature(s) Signed: 07/03/2015 11:57:25 AM By: Regan Lemming BSN, RN Entered By: Regan Lemming on 07/03/2015 11:57:25 Win, Daun Peacock ([id number]) -------------------------------------------------------------------------------- Wound Assessment Details Patient Name: Mcwhirt, Castle C. Date of Service: 07/03/2015 11:30 AM Medical Record Number: [id number] Patient Account Number: [id number] Date of Birth/Sex: [birth year] (54 y.o. Male) Treating RN: Afful,  RN, BSN, Velva Harman Primary Care Physician: [name] Other Clinician: Referring Physician: [name] Treating Physician/Extender: Frann Rider in Treatment: 1 Wound Status Wound Number: 1 Primary Dehisced Wound Etiology: Wound  Location: Abdomen - midline - Medial, Anterior Wound Open Status: Wounding Event: Surgical Injury Comorbid Anemia, Osteoarthritis, Neuropathy, Date Acquired: 05/07/2015 History: Received Chemotherapy Weeks Of Treatment: 1 Clustered Wound: No Photos Wound Measurements Length: (cm) 3.5 Width: (cm) 1 Depth: (cm) 0.8 Area: (cm) 2.749 Volume: (cm) 2.199 % Reduction in Area: 36.4% % Reduction in Volume: 36.4% Tunneling: No Undermining: No Wound Description Full Thickness Without Exposed Classification: Support Structures Wound Margin: Flat and Intact Exudate Small Amount: Exudate Type: Serous Exudate Color: amber Wound Bed Granulation Amount: Large (67-100%) Exposed Structure Granulation Quality: Pink, Pale Fascia Exposed: No Necrotic Amount: Small (1-33%) Fat Layer Exposed: No Hosack, Yug C. ([id number]) Necrotic Quality: Adherent Slough Tendon Exposed: No Muscle Exposed: No Joint Exposed: No Bone Exposed: No Limited to Skin Breakdown Periwound Skin Texture Texture Color No Abnormalities Noted: No No Abnormalities Noted: No Callus: No Atrophie Blanche: No Crepitus: No Cyanosis: No Excoriation: No Ecchymosis: No Fluctuance: No Erythema: No Friable: No Hemosiderin Staining: No Induration: No Mottled: No Localized Edema: No Pallor: No Rash: No Rubor: No Scarring: Yes Temperature / Pain Moisture Temperature: No Abnormality No Abnormalities Noted: No Tenderness on Palpation: Yes Dry / Scaly: No Maceration: No Moist: Yes Wound Preparation Ulcer Cleansing: Rinsed/Irrigated with Saline Topical Anesthetic Applied: Other: lidociane 4%, Assessment Notes Depth not measured because patient refused to be assessed. therefore carried over the depth from the last assessment Treatment Notes Wound #1 (Medial, Anterior Abdomen - midline) 1. Cleansed with: Clean wound with Normal Saline 4. Dressing Applied: Dry Gauze 7. Secured with Tape Notes patient  refused for wound to be packed. dry gauze applied. Electronic Signature(s) Signed: 07/04/2015 8:48:35 AM By: Regan Lemming BSN, RN Previous Signature: 07/03/2015 11:54:04 AM Version By: Regan Lemming BSN, RN Previous Signature: 07/03/2015 11:42:20 AM Version By: Regan Lemming BSN, RN Zatarain, Daun Peacock ([id number]) Entered By: Regan Lemming on 07/04/2015 08:48:35 Mortensen, Daun Peacock ([id number]) -------------------------------------------------------------------------------- Vitals Details Patient Name: Bluestone, Bishoy C. Date of Service: 07/03/2015 11:30 AM Medical Record Number: [id number] Patient Account Number: [id number] Date of Birth/Sex: 1960/11/30 (54 y.o. Male) Treating RN: Baruch Gouty, RN, BSN, [place] Primary Care Physician: [name] Other Clinician: Referring Physician: [name] Treating Physician/Extender: Frann Rider in Treatment: 1 Vital Signs Time Taken: 11:38 Temperature (F): 98.2 Height (in): 71 Pulse (bpm): 102 Weight (lbs): 274 Respiratory Rate (breaths/min): 20 Body Mass Index (BMI): 38.2 Blood Pressure (mmHg): 138/89 Reference Range: 80 - 120 mg / dl Electronic Signature(s) Signed: 07/03/2015 11:40:26 AM By: Regan Lemming BSN, RN Entered By: Regan Lemming on 07/03/2015 11:40:26

## 2015-07-04 ENCOUNTER — Inpatient Hospital Stay: Payer: BLUE CROSS/BLUE SHIELD

## 2015-07-04 ENCOUNTER — Other Ambulatory Visit: Payer: Self-pay | Admitting: Internal Medicine

## 2015-07-04 VITALS — BP 138/96 | HR 78 | Temp 97.6°F

## 2015-07-04 DIAGNOSIS — C186 Malignant neoplasm of descending colon: Secondary | ICD-10-CM | POA: Diagnosis not present

## 2015-07-04 MED ORDER — HEPARIN SOD (PORK) LOCK FLUSH 100 UNIT/ML IV SOLN: 500.0000 [IU] | Freq: Once | INTRAVENOUS | Status: DC | PRN

## 2015-07-04 MED ORDER — SODIUM CHLORIDE 0.9 % IV SOLN
Freq: Once | INTRAVENOUS | Status: AC
  Administered 2015-07-04: 10:00:00 via INTRAVENOUS
  Filled 2015-07-04: qty 1000

## 2015-07-04 MED ORDER — ATROPINE SULFATE 0.4 MG/ML IJ SOLN
0.4000 mg | Freq: Once | INTRAMUSCULAR | Status: AC | PRN
  Administered 2015-07-04: 0.4 mg via INTRAVENOUS
  Filled 2015-07-04: qty 1

## 2015-07-04 MED ORDER — SODIUM CHLORIDE 0.9 % IJ SOLN
10.0000 mL | INTRAMUSCULAR | Status: DC | PRN
  Administered 2015-07-04: 10 mL
  Filled 2015-07-04: qty 10

## 2015-07-04 MED ORDER — FLUOROURACIL CHEMO INJECTION 2.5 GM/50ML
400.0000 mg/m2 | Freq: Once | INTRAVENOUS | Status: AC
  Administered 2015-07-04: 800 mg via INTRAVENOUS
  Filled 2015-07-04: qty 16

## 2015-07-04 MED ORDER — SODIUM CHLORIDE 0.9 % IV SOLN
Freq: Once | INTRAVENOUS | Status: AC
  Administered 2015-07-04: 10:00:00 via INTRAVENOUS
  Filled 2015-07-04: qty 8

## 2015-07-04 MED ORDER — LEUCOVORIN CALCIUM INJECTION 350 MG
1000.0000 mg | Freq: Once | INTRAMUSCULAR | Status: AC
  Administered 2015-07-04: 1000 mg via INTRAVENOUS
  Filled 2015-07-04: qty 50

## 2015-07-04 MED ORDER — SODIUM CHLORIDE 0.9 % IV SOLN
2400.0000 mg/m2 | INTRAVENOUS | Status: DC
  Administered 2015-07-04: 4650 mg via INTRAVENOUS
  Filled 2015-07-04: qty 93

## 2015-07-04 MED ORDER — DEXTROSE 5 % IV SOLN
125.0000 mg/m2 | Freq: Once | INTRAVENOUS | Status: AC
  Administered 2015-07-04: 242 mg via INTRAVENOUS
  Filled 2015-07-04: qty 10

## 2015-07-04 NOTE — Progress Notes (Addendum)
BRITNEY, CAPTAIN ([id number]) Visit Report for 07/03/2015 Chief Complaint Document Details Patient Name: [name], [name] Date of Service: 07/03/2015 11:30 AM Medical Record Number: [id number] Patient Account Number: [id number] Date of Birth/Sex: 1960-12-15 (54 y.o. Male) Treating RN: Baruch Gouty, RN, BSN, Velva Harman Primary Care Physician: [name] Other Clinician: Referring Physician: [name] Treating Physician/Extender: Frann Rider in Treatment: 1 Information Obtained from: Patient Chief Complaint Patient presents to the wound care center for a consult due non healing wound of the abdomen wall which she's had for the last 7 weeks. Electronic Signature(s) Signed: 07/03/2015 12:05:06 PM By: Christin Fudge MD, FACS Entered By: Christin Fudge on 07/03/2015 12:05:06 Berhe, Daun Peacock ([id number]) -------------------------------------------------------------------------------- HPI Details Patient Name: [name], [name] Date of Service: 07/03/2015 11:30 AM Medical Record Number: [id number] Patient Account Number: [id number] Date of Birth/Sex: [birth year] (54 y.o. Male) Treating RN: Baruch Gouty, RN, BSN, Velva Harman Primary Care Physician: [name] Other Clinician: Referring Physician: [name] Treating Physician/Extender: Frann Rider in Treatment: 1 History of Present Illness Location: area in the midline where he had a surgical scar which is draining fluid. Quality: Patient reports experiencing a sharp pain to affected area(s). Severity: Patient states wound are getting worse. Duration: Patient has had the wound for > 2 months prior to seeking treatment at the wound center Timing: Pain in wound is Intermittent (comes and goes Context: The wound occurred when the patient had his staples removed and there was drainage from that area. Modifying Factors: Other treatment(s) tried include:he has been putting a dry gauze dressing to that area. Associated Signs and Symptoms: Patient  reports having difficulty standing for long periods. HPI Description: 54 year old gentleman who was sent as from Dr. Metro Kung office for a nonhealing surgical wound which she's had for about 7 weeks. 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 He does not have an appointment to see his surgeon for the next 2 weeks. at present he is undergoing chemotherapy and has nausea and light headedness on and off with severe amount of pain in the abdomen. 07/03/2015 -- the patient has an appointment to see surgeon this coming week. Both the patient and his wife have been very reluctant to do the packing because of the tenderness the patient has over his wound. He has been doing fine otherwise. Electronic Signature(s) Signed: 07/03/2015 12:05:39 PM By: Christin Fudge MD, FACS Entered By: Christin Fudge on 07/03/2015 12:05:38 Pryce, Daun Peacock ([id number]) -------------------------------------------------------------------------------- Physical Exam Details Patient Name: [name], [name] Date of Service: 07/03/2015 11:30 AM Medical Record Number: [id number] Patient Account Number: [id number] Date of Birth/Sex: 1961-01-10 (54 y.o. Male) Treating RN: Baruch Gouty, RN, BSN, Velva Harman Primary Care Physician: [name] Other Clinician: Referring Physician: [name] Treating Physician/Extender: Frann Rider in Treatment: 1 Constitutional . Pulse regular. Respirations normal and unlabored. Afebrile. . Eyes Nonicteric. Reactive to light. Ears, Nose, Mouth, and Throat Lips, teeth, and gums WNL.Marland Kitchen Moist mucosa without lesions . Neck supple and nontender. No palpable supraclavicular or cervical adenopathy. Normal sized without goiter. Respiratory WNL. No retractions.. Cardiovascular Pedal Pulses WNL. No clubbing, cyanosis or edema. Chest Breasts symmetical and no nipple  discharge.. Breast tissue WNL, no masses, lumps, or tenderness.. Lymphatic No adneopathy. No adenopathy. No adenopathy. Musculoskeletal Adexa without tenderness or enlargement.. Digits and nails w/o clubbing, cyanosis, infection, petechiae, ischemia, or inflammatory conditions.. Integumentary (Hair, Skin) No suspicious lesions. No crepitus  or fluctuance. No peri-wound warmth or erythema. No masses.Marland Kitchen Psychiatric Judgement and insight Intact.. No evidence of depression, anxiety, or agitation.. Notes The midline wound has 3 sinus tracts the upper to a fairly open but the lower one is closed. The patient is not agreeable to have them probe today because of fear of tenderness. Electronic Signature(s) Signed: 07/03/2015 12:08:52 PM By: Christin Fudge MD, FACS Entered By: Christin Fudge on 07/03/2015 12:08:51 Porr, Daun Peacock ([id number]) -------------------------------------------------------------------------------- Physician Orders Details Patient Name: Shearer, Brenda C. Date of Service: 07/03/2015 11:30 AM Medical Record Number: [id number] Patient Account Number: [id number] Date of Birth/Sex: 17-Nov-1960 (54 y.o. Male) Treating RN: Baruch Gouty, RN, BSN, Velva Harman Primary Care Physician: [name] Other Clinician: Referring Physician: [name] Treating Physician/Extender: Frann Rider in Treatment: 1 Verbal / Phone Orders: Yes Clinician: Afful, RN, BSN, Rita Read Back and Verified: Yes Diagnosis Coding Wound Cleansing Wound #1 Medial,Anterior Abdomen - midline o Clean wound with Normal Saline. Anesthetic Wound #1 Medial,Anterior Abdomen - midline o Topical Lidocaine 4% cream applied to wound bed prior to debridement Primary Wound Dressing Wound #1 Medial,Anterior Abdomen - midline o Iodoform packing Gauze - Packed lightly into 3 open areas with depth. Secondary Dressing Wound #1 Medial,Anterior Abdomen - midline o Boardered Foam Dressing Dressing Change Frequency Wound  #1 Medial,Anterior Abdomen - midline o Change dressing every day. Follow-up Appointments Wound #1 Medial,Anterior Abdomen - midline o Return Appointment in 1 week. Electronic Signature(s) Signed: 07/03/2015 11:51:55 AM By: Regan Lemming BSN, RN Signed: 07/03/2015 4:31:29 PM By: Christin Fudge MD, FACS Entered By: Regan Lemming on 07/03/2015 11:51:55 Shimmin, Daun Peacock ([id number]) -------------------------------------------------------------------------------- Problem List Details Patient Name: [name], [name] Date of Service: 07/03/2015 11:30 AM Medical Record Number: [id number] Patient Account Number: [id number] Date of Birth/Sex: 05-Sep-1961 (54 y.o. Male) Treating RN: Baruch Gouty, RN, BSN, Velva Harman Primary Care Physician: [name] Other Clinician: Referring Physician: [name] Treating Physician/Extender: Frann Rider in Treatment: 1 Active Problems ICD-10 Encounter Code Description Active Date Diagnosis S31.105A Unspecified open wound of abdominal wall, periumbilic 37/85/8850 Yes region without penetration into peritoneal cavity, initial encounter T81.31XA Disruption of external operation (surgical) wound, not 06/24/2015 Yes elsewhere classified, initial encounter C18.6 Malignant neoplasm of descending colon 06/24/2015 Yes Z92.21 Personal history of antineoplastic chemotherapy 06/24/2015 Yes Inactive Problems Resolved Problems Electronic Signature(s) Signed: 07/03/2015 12:05:00 PM By: Christin Fudge MD, FACS Entered By: Christin Fudge on 07/03/2015 12:05:00 Perryman, Daun Peacock ([id number]) -------------------------------------------------------------------------------- Progress Note Details Patient Name: [name], [name] Date of Service: 07/03/2015 11:30 AM Medical Record Number: [id number] Patient Account Number: [id number] Date of Birth/Sex: 12-Oct-1960 (54 y.o. Male) Treating RN: Baruch Gouty, RN, BSN, Velva Harman Primary Care Physician: [name] Other Clinician: Referring  Physician: [name] Treating Physician/Extender: Frann Rider in Treatment: 1 Subjective Chief Complaint Information obtained from Patient Patient presents to the wound care center for a consult due non healing wound of the abdomen wall which she's had for the last 7 weeks. History of Present Illness (HPI) The following HPI elements were documented for the patient's wound: Location: area in the midline where he had a surgical scar which is draining fluid. Quality: Patient reports experiencing a sharp pain to affected area(s). Severity: Patient states wound are getting worse. Duration: Patient has had the wound for > 2 months prior to seeking treatment at the wound center Timing: Pain in wound is Intermittent (comes and goes Context: The wound occurred when the patient had his staples removed and there was drainage from that area. Modifying Factors: Other treatment(s)  tried include:he has been putting a dry gauze dressing to that area. Associated Signs and Symptoms: Patient reports having difficulty standing for long periods. 54 year old gentleman who was sent as from Dr. Metro Kung office for a nonhealing surgical wound which she's had for about 7 weeks. 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 He does not have an appointment to see his surgeon for the next 2 weeks. at present he is undergoing chemotherapy and has nausea and light headedness on and off with severe amount of pain in the abdomen. 07/03/2015 -- the patient has an appointment to see surgeon this coming week. Both the patient and his wife have been very reluctant to do the packing because of the tenderness the patient has over his wound. He has been doing fine otherwise. Objective Constitutional Pulse regular. Respirations normal and unlabored. Afebrile. Reinwald, [place] C.  ([id number]) Vitals Time Taken: 11:38 AM, Height: 71 in, Weight: 274 lbs, BMI: 38.2, Temperature: 98.2 F, Pulse: 102 bpm, Respiratory Rate: 20 breaths/min, Blood Pressure: 138/89 mmHg. Eyes Nonicteric. Reactive to light. Ears, Nose, Mouth, and Throat Lips, teeth, and gums WNL.Marland Kitchen Moist mucosa without lesions . Neck supple and nontender. No palpable supraclavicular or cervical adenopathy. Normal sized without goiter. Respiratory WNL. No retractions.. Cardiovascular Pedal Pulses WNL. No clubbing, cyanosis or edema. Chest Breasts symmetical and no nipple discharge.. Breast tissue WNL, no masses, lumps, or tenderness.. Lymphatic No adneopathy. No adenopathy. No adenopathy. Musculoskeletal Adexa without tenderness or enlargement.. Digits and nails w/o clubbing, cyanosis, infection, petechiae, ischemia, or inflammatory conditions.Marland Kitchen Psychiatric Judgement and insight Intact.. No evidence of depression, anxiety, or agitation.. General Notes: The midline wound has 3 sinus tracts the upper to a fairly open but the lower one is closed. The patient is not agreeable to have them probe today because of fear of tenderness. Integumentary (Hair, Skin) No suspicious lesions. No crepitus or fluctuance. No peri-wound warmth or erythema. No masses.. Wound #1 status is Open. Original cause of wound was Surgical Injury. The wound is located on the Medial,Anterior Abdomen - midline. The wound measures 3.5cm length x 1cm width x 0.8cm depth; 2.749cm^2 area and 2.199cm^3 volume. The wound is limited to skin breakdown. There is no tunneling or undermining noted. There is a small amount of serous drainage noted. The wound margin is flat and intact. There is large (67-100%) pink, pale granulation within the wound bed. There is a small (1-33%) amount of necrotic tissue within the wound bed including Adherent Slough. The periwound skin appearance exhibited: Scarring, Moist. The periwound skin appearance did not  exhibit: Callus, Crepitus, Excoriation, Fluctuance, Friable, Induration, Localized Edema, Rash, Dry/Scaly, Maceration, Atrophie Blanche, Cyanosis, Ecchymosis, Hemosiderin Staining, Mottled, Pallor, Rubor, Erythema. Periwound temperature was noted as No Abnormality. The periwound has tenderness on palpation. General Notes: Depth not measured because patient refused to be assessed. therefore carried over the Guercio, Maya C. ([id number]) depth from the last assessment Assessment Active Problems ICD-10 S31.105A - Unspecified open wound of abdominal wall, periumbilic region without penetration into peritoneal cavity, initial encounter T81.31XA - Disruption of external operation (surgical) wound, not elsewhere classified, initial encounter C18.6 - Malignant neoplasm of descending colon Z92.21 - Personal history of antineoplastic chemotherapy The patient has several psychosocial issues and is not willing to have his wounds packed. I've asked him to see his surgeon on Tuesday as planned. He can continue packing the wounds as  I have recommended at his convenience and if he is agreeable. We have discussed the rationale of packing the wounds and have given him every option to get this done on a regular basis. I would be happy to see him back next week if he so desires. Plan Wound Cleansing: Wound #1 Medial,Anterior Abdomen - midline: Clean wound with Normal Saline. Anesthetic: Wound #1 Medial,Anterior Abdomen - midline: Topical Lidocaine 4% cream applied to wound bed prior to debridement Primary Wound Dressing: Wound #1 Medial,Anterior Abdomen - midline: Iodoform packing Gauze - Packed lightly into 3 open areas with depth. Secondary Dressing: Wound #1 Medial,Anterior Abdomen - midline: Boardered Foam Dressing Dressing Change Frequency: Wound #1 Medial,Anterior Abdomen - midline: Change dressing every day. Follow-up Appointments: Wound #1 Medial,Anterior Abdomen - midline: Return Appointment  in 1 week. Jacome, JANOAH MENNA ([id number]) The patient has several psychosocial issues and is not willing to have his wounds packed. I've asked him to see his surgeon on Tuesday as planned. He can continue packing the wounds as I have recommended at his convenience and if he is agreeable. We have discussed the rationale of packing the wounds and have given him every option to get this done on a regular basis. I would be happy to see him back next week if he so desires. Electronic Signature(s) Signed: 07/07/2015 8:15:35 AM By: Christin Fudge MD, FACS Previous Signature: 07/03/2015 12:10:51 PM Version By: Christin Fudge MD, FACS Entered By: Christin Fudge on 07/07/2015 08:15:35 Eng, Daun Peacock ([id number]) -------------------------------------------------------------------------------- SuperBill Details Patient Name: [name], [name] Date of Service: 07/03/2015 Medical Record Number: [id number] Patient Account Number: [id number] Date of Birth/Sex: [birth year] (54 y.o. Male) Treating RN: Baruch Gouty, RN, BSN, Velva Harman Primary Care Physician: [name] Other Clinician: Referring Physician: [name] Treating Physician/Extender: Frann Rider in Treatment: 1 Diagnosis Coding ICD-10 Codes Code Description Unspecified open wound of abdominal wall, periumbilic region without penetration into S31.105A peritoneal cavity, initial encounter Disruption of external operation (surgical) wound, not elsewhere classified, initial T81.31XA encounter C18.6 Malignant neoplasm of descending colon Z92.21 Personal history of antineoplastic chemotherapy Facility Procedures CPT4 Code: [id number] Description: [phone] - WOUND CARE VISIT-LEV 2 EST PT Modifier: Quantity: 1 Physician Procedures CPT4: Description Modifier Quantity Code [id number] - WC PHYS LEVEL 3 - EST PT 1 ICD-10 Description Diagnosis S31.105A Unspecified open wound of abdominal wall, periumbilic region without penetration into peritoneal cavity,  initial encounter T81.31XA  Disruption of external operation (surgical) wound, not elsewhere classified, initial encounter C18.6 Malignant neoplasm of descending colon Z92.21 Personal history of antineoplastic chemotherapy Electronic Signature(s) Signed: 07/03/2015 12:11:08 PM By: Christin Fudge MD, FACS Entered By: Christin Fudge on 07/03/2015 12:11:08

## 2015-07-06 ENCOUNTER — Inpatient Hospital Stay: Payer: BLUE CROSS/BLUE SHIELD

## 2015-07-06 VITALS — BP 133/91 | HR 74 | Temp 97.2°F | Resp 20

## 2015-07-06 DIAGNOSIS — C186 Malignant neoplasm of descending colon: Secondary | ICD-10-CM | POA: Diagnosis not present

## 2015-07-06 MED ORDER — HEPARIN SOD (PORK) LOCK FLUSH 100 UNIT/ML IV SOLN
500.0000 [IU] | Freq: Once | INTRAVENOUS | Status: AC | PRN
  Administered 2015-07-06: 500 [IU]

## 2015-07-06 MED ORDER — SODIUM CHLORIDE 0.9 % IJ SOLN
10.0000 mL | INTRAMUSCULAR | Status: DC | PRN
  Administered 2015-07-06: 10 mL
  Filled 2015-07-06: qty 10

## 2015-07-10 ENCOUNTER — Other Ambulatory Visit: Payer: Self-pay | Admitting: *Deleted

## 2015-07-10 DIAGNOSIS — C186 Malignant neoplasm of descending colon: Secondary | ICD-10-CM

## 2015-07-10 MED ORDER — OXYCODONE HCL 5 MG PO TABS: 5.0000 mg | ORAL_TABLET | ORAL | Status: DC | PRN

## 2015-07-10 NOTE — Telephone Encounter (Signed)
Informed that prescription is ready to pick up  

## 2015-07-14 NOTE — Progress Notes (Signed)
[place] @ [facility] Telephone:(336) [phone]  Fax:(336) [place]  DUB MACLELLAN OB: 09/23/1960  MR#: [id number]  OMB#:[id number]  Patient Care Team: [name], MD as PCP - General (Family Medicine) [name], MD as Consulting Physician (General Surgery)  CHIEF COMPLAINT:  Chief Complaint  Patient presents with  . 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 Specimen integrity: Intact. Macroscopic tumor perforation: Not identified. Invasive tumor: Maximum size: 5.5 cm. Histologic type(s): Invasive adenocarcinoma. Histologic grade and differentiation: G1: well differentiated/low grade  Pathologic Staging: pT4b, pN1b, pM1b Ancillary studies: MMR and MSI will be performed. MSI stable K-ras wild-type not mutated by Foundation study VISIT DIAGNOSIS:     ICD-9-CM ICD-10-CM   1. 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    Oncology Flowsheet 05/27/2015 06/02/2015 06/02/2015 06/16/2015 06/16/2015 07/04/2015 07/04/2015  Day, Cycle - Day 1, Cycle 1   Day 1, Cycle 2   Day 1, Cycle 1    dexamethasone (DECADRON) IJ - - - - - - -  dexamethasone (DECADRON) IV - [ 12 mg ]   [ 12 mg ]   [ 20 mg ]    enoxaparin (LOVENOX) [place] - - - - - - -  ferumoxytol (FERAHEME) IV - - - - - - -  fluorouracil (ADRUCIL) IV - 400 mg/m2 2,400 mg/m2 400 mg/m2 2,400 mg/m2 400 mg/m2 2,400 mg/m2  fosaprepitant (EMEND) IV - [ 150 mg ]   [ 150 mg ]   - -  irinotecan (CAMPTOSAR) IV - - - - - 125 mg/m2    leucovorin IV - 1,000 mg   1,000 mg   1,000 mg    ondansetron (ZOFRAN) IJ - - - - - - -    ondansetron (ZOFRAN) IV - - - - - [ 16 mg ]    oxaliplatin (ELOXATIN) IV - 85 mg/m2   85 mg/m2   - -  palonosetron (ALOXI) IV - 0.25 mg   0.25 mg   - -    INTERVAL HISTORY: 54 year old gentleman is somewhat depressed.  Started having increasing pain in the left lower abdominal area since March of 2016.  Patient was admitted in the hospital CT scan revealed possibility of diverticulitis versus obstructing lesion patient was treated with antibiotics. Patient continues to have abdominal pain.  Drainage from abdominal wound.  Patient is taking frequent Vicodin.  Using fentanyl patch 12 g.  No nausea no vomiting appetite has been stable. Here for further follow-up and treatment consideration.  According to him had 2 days of diarrhea after chemotherapy took Imodium which has helped. Somewhat depressed. REVIEW OF SYSTEMS:   Review of system: GENERAL:Patient is feeling somewhat depressed.  PERFORMANCE STATUS (ECOG):  01 HEENT:  No visual changes, runny nose, sore throat, mouth sores or tenderness. Lungs: No shortness of breath or cough.  No hemoptysis. Cardiac:  No chest pain, palpitations, orthopnea, or PND. GI:  No nausea, vomiting, diarrhea, constipation, melena or hematochezia.   small area in abdominal wound is still healing  Has colostomy.  Difficulty dealing with colostomy.  Persistent  abdominal pain requiring hydrocodone to control pain Abdominal wound is healing.  Per tenderness present.  Some drainage which is significant.  Tenderness around that area. Musculoskeletal:  No back pain.  No joint pain.  No muscle tenderness. GU: No hematuria. Extremities:  No pain or swelling. Skin:  No rashes or skin changes. Neuro:  No headache, numbness or weakness, balance or coordination issues. Endocrine:  No diabetes, thyroid issues, hot flashes or night sweats. Psych:  No mood changes, depression or anxiety. Pain:  No focal pain. Patient has a history of sarcoidosis diagnosis in 2014 Was given  Lyrica for neuropathy.  But patient has discontinued that because of progressive joint pains Review of systems:  All other systems reviewed and found to be negative.   As per HPI. Otherwise, a complete review of systems is negatve.  PAST MEDICAL HISTORY: Past Medical History  Diagnosis Date  . Sarcoid ([place]) 10/2012  . Neuropathy ([place])   . 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 Besnier-Boeck disease ([place]) 12/21/2012  . Extreme obesity ([place]) 12/21/2012  . LBP (low back pain) 05/28/2014  . Adiposity 03/21/2015  . PONV (postoperative nausea and vomiting)   . Iron deficiency anemia due to chronic blood loss 05/08/2015  . Hypertension     has been off BP med x 6 months  . Arthritis   . GERD (gastroesophageal reflux disease)   . Abdominal pain     mid abdomen  . History of kidney stones   . History of transfusion   . Colon cancer ([place])   . Skin cancer     "it flairs up with sarcoidosis"    PAST SURGICAL HISTORY: Past Surgical History  Procedure Laterality Date  . Cardiac catheterization    . Flexible sigmoidoscopy N/A 05/07/2015    Procedure: FLEXIBLE SIGMOIDOSCOPY;  Surgeon: Laurence Spates, MD;  Location: [facility];  Service: Endoscopy;  Laterality: N/A;  . Laparoscopic partial colectomy N/A 05/07/2015    Procedure: LAPAROSCOPIC ASSITED PARTIAL COLECTOMY WITH COLOSTOMY, SMALL BOWEL RESECTION, EXCISION OF PERITONEAL NODULE;  Surgeon: [name], MD;  Location: [facility];  Service: General;  Laterality: N/A;  . Portacath placement Right 05/27/2015    Procedure: INSERTION PORT-A-CATH;  Surgeon: [name], MD;  Location: [facility];  Service: General;  Laterality: Right;    FAMILY HISTORY Family History  Problem Relation Age of Onset  . Arthritis Mother   . Heart disease Mother   . Stroke Father         ADVANCED DIRECTIVES:  Patient does not have any living will or healthcare power of attorney.  Information was given .  Available resources had been  discussed.  We will follow-up on subsequent appointments regarding this issue  HEALTH MAINTENANCE: Social History  Substance Use Topics  . Smoking status: Passive Smoke Exposure - Never Smoker  . Smokeless tobacco: Never Used  . Alcohol Use: No      Allergies  Allergen Reactions  . Fentanyl Nausea And Vomiting    IV med  . Versed [Midazolam] Nausea And Vomiting  . Gabapentin     Swallowing problems  . Paba Derivatives Nausea And Vomiting    Pt states he is allergic to unknown anesthesia. Pt has nausea and vomiting with anesthesia.     Current Outpatient Prescriptions  Medication Sig Dispense Refill  . AMBIEN 10 MG tablet Take 10 mg by mouth at bedtime as needed for sleep.   4  . fentaNYL (DURAGESIC - DOSED MCG/HR) 50 MCG/HR Place 1  patch (50 mcg total) onto the skin every 3 (three) days. 10 patch 0  . lidocaine-prilocaine (EMLA) cream Apply 1 application topically as needed. 30 g 3  . ondansetron (ZOFRAN) 4 MG tablet Take 1 tablet (4 mg total) by mouth every 6 (six) hours as needed for nausea. 20 tablet 0  . fentaNYL (DURAGESIC - DOSED MCG/HR) 25 MCG/HR patch PLACE 1 PATCH ONTO THE SKIN EVERY 3 DAYS  0  . HYDROcodone-acetaminophen (NORCO/VICODIN) 5-325 MG tablet TAKE 1 TO 2 TABLETS EVERY 4 HOURS AS NEEDED FOR MODERATE PAIN  0  . oxyCODONE (OXY IR/ROXICODONE) 5 MG immediate release tablet Take 1 tablet (5 mg total) by mouth every 4 (four) hours as needed for severe pain. 60 tablet 0   No current facility-administered medications for this visit.    OBJECTIVE: PHYSICAL EXAM: GENERAL:  Well developed, well nourished, Patient is in somewhat pain and in distress. MENTAL STATUS:  Alert and oriented to person, place and time.   RESPIRATORY:  Clear to auscultation without rales, wheezes or rhonchi. CARDIOVASCULAR:  Regular rate and rhythm without murmur, rub or gallop.  ABDOMEN:  Soft, non-tender, with active bowel sounds, and no hepatosplenomegaly.  No masses. Wound is still  draining. Patient is putting Neosporin Colostomy functioning well BACK:  No CVA tenderness.  No tenderness on percussion of the back or rib cage. SKIN:  No rashes, ulcers or lesions. EXTREMITIES: No edema, no skin discoloration or tenderness.  No palpable cords. LYMPH NODES: No palpable cervical, supraclavicular, axillary or inguinal adenopathy  NEUROLOGICAL: Unremarkable. PSYCH:  depression  Filed Vitals:   06/30/15 0923  Pulse: 84  Temp: 98 F (36.7 C)     Body mass index is 40.61 kg/(m^2).    ECOG FS:1 - Symptomatic but completely ambulatory  LAB RESULTS:  Infusion on 06/30/2015  Component Date Value Ref Range Status  . WBC 06/30/2015 8.7  3.8 - 10.6 K/uL Final   A-LINE DRAW  . RBC 06/30/2015 4.70  4.40 - 5.90 MIL/uL Final  . Hemoglobin 06/30/2015 11.5* 13.0 - 18.0 g/dL Final  . HCT 06/30/2015 36.0* 40.0 - 52.0 % Final  . MCV 06/30/2015 76.6* 80.0 - 100.0 fL Final  . MCH 06/30/2015 24.5* 26.0 - 34.0 pg Final  . MCHC 06/30/2015 32.0  32.0 - 36.0 g/dL Final  . RDW 06/30/2015 23.9* 11.5 - 14.5 % Final  . Platelets 06/30/2015 263  150 - 440 K/uL Final  . Neutrophils Relative % 06/30/2015 72   Final  . Neutro Abs 06/30/2015 6.3  1.4 - 6.5 K/uL Final  . Lymphocytes Relative 06/30/2015 16   Final  . Lymphs Abs 06/30/2015 1.4  1.0 - 3.6 K/uL Final  . Monocytes Relative 06/30/2015 10   Final  . Monocytes Absolute 06/30/2015 0.9  0.2 - 1.0 K/uL Final  . Eosinophils Relative 06/30/2015 1   Final  . Eosinophils Absolute 06/30/2015 0.1  0 - 0.7 K/uL Final  . Basophils Relative 06/30/2015 1   Final  . Basophils Absolute 06/30/2015 0.1  0 - 0.1 K/uL Final  . Sodium 06/30/2015 137  135 - 145 mmol/L Final  . Potassium 06/30/2015 3.6  3.5 - 5.1 mmol/L Final  . Chloride 06/30/2015 104  101 - 111 mmol/L Final  . CO2 06/30/2015 25  22 - 32 mmol/L Final  . Glucose, Bld 06/30/2015 117* 65 - 99 mg/dL Final  . BUN 06/30/2015 12  6 - 20 mg/dL Final  . Creatinine, Ser 06/30/2015 1.08  0.61 -  1.24 mg/dL  Final  . Calcium 06/30/2015 8.7* 8.9 - 10.3 mg/dL Final  . Total Protein 06/30/2015 7.4  6.5 - 8.1 g/dL Final  . Albumin 06/30/2015 3.8  3.5 - 5.0 g/dL Final  . AST 06/30/2015 32  15 - 41 U/L Final  . ALT 06/30/2015 30  17 - 63 U/L Final  . Alkaline Phosphatase 06/30/2015 104  38 - 126 U/L Final  . Total Bilirubin 06/30/2015 0.3  0.3 - 1.2 mg/dL Final  . GFR calc non Af Amer 06/30/2015 >60  >60 mL/min Final  . GFR calc Af Amer 06/30/2015 >60  >60 mL/min Final   Comment: (NOTE) The eGFR has been calculated using the CKD EPI equation. This calculation has not been validated in all clinical situations. eGFR's persistently <60 mL/min signify possible Chronic Kidney Disease.   . Anion gap 06/30/2015 8  5 - 15 Final  . Magnesium 06/30/2015 2.2  1.7 - 2.4 mg/dL Final     STUDIES: No results found.  ASSESSMENT:  Carcinoma of distal descending colon T4 b(small bowel involvement) N1 B (2 of 15 lymph nodes positive)   M1 B2 tumor(  Peritoneal nodules) 2.sarcoidosis with involvement of mediastinal lymph node and axillary lymph node by history. 3. No significant family history of any colon cancer or any other malignancy 4. Preop CEA was less than 1 5.Preop CT scan has been reviewed does not show any evidence of liver metastases 6. Patient did not have colonoscopy 7. MSI stable 8.  K-ras not mutated by Foundation   One study   PLAN:   Due to diarrhea.  We will decrease the dose to ideal body weight which is body surface area 1.94 m Patient was advised to take Imodium any diarrhea has not better than IV fluid will be given. Patient was advised not to use Neosporin and keep wound dry and clean patient has been referred to wound care center Continue chemotherapy Abdominal pain Fentanyl has been increased to 25 g.  Patient was advised to cut down on hydrocodone pill.  Because of Tylenol content All lab data has been reviewed     Patient expressed understanding and was in  agreement with this plan. He also understands that He can call clinic at any time with any questions, concerns, or complaints.    No matching staging information was found for the patient.  Forest Gleason, MD   07/14/2015 8:35 AM

## 2015-07-17 ENCOUNTER — Inpatient Hospital Stay: Payer: BLUE CROSS/BLUE SHIELD | Attending: Oncology

## 2015-07-17 ENCOUNTER — Inpatient Hospital Stay (HOSPITAL_BASED_OUTPATIENT_CLINIC_OR_DEPARTMENT_OTHER): Payer: BLUE CROSS/BLUE SHIELD | Admitting: Oncology

## 2015-07-17 ENCOUNTER — Inpatient Hospital Stay: Payer: BLUE CROSS/BLUE SHIELD

## 2015-07-17 VITALS — BP 155/105 | HR 90 | Temp 96.7°F | Wt 289.7 lb

## 2015-07-17 DIAGNOSIS — C186 Malignant neoplasm of descending colon: Secondary | ICD-10-CM | POA: Insufficient documentation

## 2015-07-17 DIAGNOSIS — E669 Obesity, unspecified: Secondary | ICD-10-CM | POA: Insufficient documentation

## 2015-07-17 DIAGNOSIS — Z87442 Personal history of urinary calculi: Secondary | ICD-10-CM | POA: Insufficient documentation

## 2015-07-17 DIAGNOSIS — K219 Gastro-esophageal reflux disease without esophagitis: Secondary | ICD-10-CM | POA: Insufficient documentation

## 2015-07-17 DIAGNOSIS — Z5111 Encounter for antineoplastic chemotherapy: Secondary | ICD-10-CM | POA: Insufficient documentation

## 2015-07-17 DIAGNOSIS — Z933 Colostomy status: Secondary | ICD-10-CM

## 2015-07-17 DIAGNOSIS — Z9049 Acquired absence of other specified parts of digestive tract: Secondary | ICD-10-CM | POA: Insufficient documentation

## 2015-07-17 DIAGNOSIS — I10 Essential (primary) hypertension: Secondary | ICD-10-CM | POA: Diagnosis not present

## 2015-07-17 DIAGNOSIS — Z79899 Other long term (current) drug therapy: Secondary | ICD-10-CM

## 2015-07-17 LAB — CBC WITH DIFFERENTIAL/PLATELET
BASOS PCT: 1 %
Basophils Absolute: 0 10*3/uL (ref 0–0.1)
EOS ABS: 0.1 10*3/uL (ref 0–0.7)
EOS PCT: 1 %
HCT: 35.2 % — ABNORMAL LOW (ref 40.0–52.0)
HEMOGLOBIN: 11.2 g/dL — AB (ref 13.0–18.0)
LYMPHS ABS: 1.3 10*3/uL (ref 1.0–3.6)
Lymphocytes Relative: 21 %
MCH: 24.8 pg — AB (ref 26.0–34.0)
MCHC: 31.7 g/dL — ABNORMAL LOW (ref 32.0–36.0)
MCV: 78.2 fL — ABNORMAL LOW (ref 80.0–100.0)
MONOS PCT: 8 %
Monocytes Absolute: 0.5 10*3/uL (ref 0.2–1.0)
NEUTROS PCT: 69 %
Neutro Abs: 4.5 10*3/uL (ref 1.4–6.5)
Platelets: 214 10*3/uL (ref 150–440)
RBC: 4.5 MIL/uL (ref 4.40–5.90)
RDW: 21.5 % — AB (ref 11.5–14.5)
WBC: 6.5 10*3/uL (ref 3.8–10.6)

## 2015-07-17 LAB — COMPREHENSIVE METABOLIC PANEL
ALT: 27 U/L (ref 17–63)
AST: 28 U/L (ref 15–41)
Albumin: 3.7 g/dL (ref 3.5–5.0)
Alkaline Phosphatase: 93 U/L (ref 38–126)
Anion gap: 6 (ref 5–15)
BILIRUBIN TOTAL: 0.3 mg/dL (ref 0.3–1.2)
BUN: 9 mg/dL (ref 6–20)
CHLORIDE: 105 mmol/L (ref 101–111)
CO2: 26 mmol/L (ref 22–32)
Calcium: 8.5 mg/dL — ABNORMAL LOW (ref 8.9–10.3)
Creatinine, Ser: 0.99 mg/dL (ref 0.61–1.24)
GFR calc Af Amer: >60 mL/min (ref >60)
Glucose, Bld: 106 mg/dL — ABNORMAL HIGH (ref 65–99)
POTASSIUM: 3.7 mmol/L (ref 3.5–5.1)
Sodium: 137 mmol/L (ref 135–145)
Total Protein: 7.2 g/dL (ref 6.5–8.1)

## 2015-07-17 LAB — MAGNESIUM: MAGNESIUM: 2 mg/dL (ref 1.7–2.4)

## 2015-07-17 MED ORDER — SODIUM CHLORIDE 0.9 % IJ SOLN
10.0000 mL | INTRAMUSCULAR | Status: DC | PRN
  Administered 2015-07-17: 10 mL
  Filled 2015-07-17: qty 10

## 2015-07-17 MED ORDER — SODIUM CHLORIDE 0.9 % IV SOLN
2400.0000 mg/m2 | INTRAVENOUS | Status: DC
  Administered 2015-07-17: 4650 mg via INTRAVENOUS
  Filled 2015-07-17: qty 93

## 2015-07-17 MED ORDER — HEPARIN SOD (PORK) LOCK FLUSH 100 UNIT/ML IV SOLN: 500.0000 [IU] | Freq: Once | INTRAVENOUS | Status: DC | PRN

## 2015-07-17 MED ORDER — SODIUM CHLORIDE 0.9 % IV SOLN
Freq: Once | INTRAVENOUS | Status: AC
  Administered 2015-07-17: 11:00:00 via INTRAVENOUS
  Filled 2015-07-17: qty 1000

## 2015-07-17 MED ORDER — ATROPINE SULFATE 0.4 MG/ML IJ SOLN
0.4000 mg | Freq: Once | INTRAMUSCULAR | Status: AC | PRN
  Administered 2015-07-17: 0.4 mg via INTRAVENOUS
  Filled 2015-07-17: qty 1

## 2015-07-17 MED ORDER — FLUOROURACIL CHEMO INJECTION 2.5 GM/50ML
400.0000 mg/m2 | Freq: Once | INTRAVENOUS | Status: AC
  Administered 2015-07-17: 800 mg via INTRAVENOUS
  Filled 2015-07-17: qty 16

## 2015-07-17 MED ORDER — OXYCODONE HCL 5 MG PO TABS: 5.0000 mg | ORAL_TABLET | ORAL | Status: DC | PRN

## 2015-07-17 MED ORDER — DEXTROSE 5 % IV SOLN
800.0000 mg | Freq: Once | INTRAVENOUS | Status: AC
  Administered 2015-07-17: 800 mg via INTRAVENOUS
  Filled 2015-07-17: qty 25

## 2015-07-17 MED ORDER — SODIUM CHLORIDE 0.9 % IV SOLN
Freq: Once | INTRAVENOUS | Status: AC
  Administered 2015-07-17: 11:00:00 via INTRAVENOUS
  Filled 2015-07-17: qty 8

## 2015-07-17 MED ORDER — FENTANYL 50 MCG/HR TD PT72: 50.0000 ug | MEDICATED_PATCH | TRANSDERMAL | Status: DC

## 2015-07-17 MED ORDER — IRINOTECAN HCL CHEMO INJECTION 100 MG/5ML
150.0000 mg/m2 | Freq: Once | INTRAVENOUS | Status: AC
  Administered 2015-07-17: 292 mg via INTRAVENOUS
  Filled 2015-07-17: qty 4.87

## 2015-07-18 ENCOUNTER — Encounter: Payer: Self-pay | Admitting: Oncology

## 2015-07-18 NOTE — Progress Notes (Signed)
[place] @ [facility] [phone]  Fax:(336) [place]  [name] OB: [birth year]  MR#: [id number]  FBP#:[id number]  Patient Care Team: [name], MD as PCP - General (Family Medicine) [name], MD as Consulting Physician (General Surgery)  CHIEF COMPLAINT:  Chief Complaint  Patient presents with  . 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 Specimen integrity: Intact. Macroscopic tumor perforation: Not identified. Invasive tumor: Maximum size: 5.5 cm. Histologic type(s): Invasive adenocarcinoma. Histologic grade and differentiation: G1: well differentiated/low grade  Pathologic Staging: pT4b, pN1b, pM1b Ancillary studies MSI stable K-ras wild-type not mutated by Foundation study.    2.Patient was started on FOLFOX in September of 2016,after 2 cycles of chemotherapy patient developed neuropathy.  Patient had a previous neuropathy as a baseline which increased so was switched over to  FOLFIRI I  IN December of 2016 As patient has clear os wild-type we can proceed to add either a Avastin or cetuximab from the next chemotherapy.(November, 2016)  VISIT DIAGNOSIS:     ICD-9-CM ICD-10-CM   1. 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    Oncology Flowsheet 06/02/2015 06/16/2015 06/16/2015 07/04/2015 07/04/2015 07/17/2015 07/17/2015  Day, Cycle   Day 1, Cycle 2   Day 1, Cycle 1   Day 1, Cycle 2    dexamethasone (DECADRON) IJ - - - - - - -  dexamethasone (DECADRON) IV   [ 12 mg ]   [ 20 mg ]   [ 20 mg ]    enoxaparin (LOVENOX) [place] - - - - - - -  ferumoxytol (FERAHEME) IV - - - - - - -    fluorouracil (ADRUCIL) IV 2,400 mg/m2 400 mg/m2 2,400 mg/m2 400 mg/m2 2,400 mg/m2 400 mg/m2 2,400 mg/m2  fosaprepitant (EMEND) IV   [ 150 mg ]   - - - -  irinotecan (CAMPTOSAR) IV - - - 125 mg/m2   150 mg/m2    leucovorin IV   1,000 mg   1,000 mg   800 mg    ondansetron (ZOFRAN) IJ - - - - - - -  ondansetron (ZOFRAN) IV - - - [ 16 mg ]   [ 16 mg ]    oxaliplatin (ELOXATIN) IV   85 mg/m2   - - - -  palonosetron (ALOXI) IV   0.25 mg   - - - -    INTERVAL HISTORY: 54 year old gentleman is somewhat depressed.  Started having increasing pain in the left lower abdominal area since March of 2016.  Patient was admitted in the hospital CT scan revealed possibility of diverticulitis versus obstructing lesion patient was treated with antibiotics. Patient continues to have abdominal pain.  Drainage from abdominal wound.  Patient is taking frequent Vicodin.  Using fentanyl patch 12 g.  No nausea no vomiting appetite has been stable. Here for further follow-up and treatment consideration.  According to him had 2 days of diarrhea after chemotherapy took Imodium which has helped. Somewhat depressed. REVIEW OF SYSTEMS:   Review of system: GENERAL:Patient is feeling somewhat depressed.  PERFORMANCE STATUS (ECOG):  01 HEENT:  No visual changes, runny  nose, sore throat, mouth sores or tenderness. Lungs: No shortness of breath or cough.  No hemoptysis. Cardiac:  No chest pain, palpitations, orthopnea, or PND. GI:  No nausea, vomiting, diarrhea, constipation, melena or hematochezia.   small area in abdominal wound is still healing  Has colostomy.  Difficulty dealing with colostomy.  Persistent abdominal pain requiring hydrocodone to control pain Abdominal wound is healing.  Per tenderness present.  Some drainage which is significant.  Tenderness around that area. Musculoskeletal:  No back pain.  No joint pain.  No muscle tenderness. GU: No hematuria. Extremities:  No pain or swelling. Skin:  No rashes or  skin changes. Neuro:  Numbness in upper and lower extremity as well as on the face has been stabilized. Endocrine:  No diabetes, thyroid issues, hot flashes or night sweats. Psych:  No mood changes, depression or anxiety. Pain:  No focal pain.  Patient had only one episode of diarrhea No significant abdominal crampy pain Patient has a history of sarcoidosis diagnosis in 2014 Was given Lyrica for neuropathy.  But patient has discontinued that because of progressive joint pains Review of systems:  All other systems reviewed and found to be negative.   As per HPI. Otherwise, a complete review of systems is negatve.  PAST MEDICAL HISTORY: Past Medical History  Diagnosis Date  . Sarcoid ([place]) 10/2012  . Neuropathy ([place])   . 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 Besnier-Boeck disease ([place]) 12/21/2012  . Extreme obesity ([place]) 12/21/2012  . LBP (low back pain) 05/28/2014  . Adiposity 03/21/2015  . PONV (postoperative nausea and vomiting)   . Iron deficiency anemia due to chronic blood loss 05/08/2015  . Hypertension     has been off BP med x 6 months  . Arthritis   . GERD (gastroesophageal reflux disease)   . Abdominal pain     mid abdomen  . History of kidney stones   . History of transfusion   . Colon cancer ([place])   . Skin cancer     "it flairs up with sarcoidosis"    PAST SURGICAL HISTORY: Past Surgical History  Procedure Laterality Date  . Cardiac catheterization    . Flexible sigmoidoscopy N/A 05/07/2015    Procedure: FLEXIBLE SIGMOIDOSCOPY;  Surgeon: Laurence Spates, MD;  Location: [facility];  Service: Endoscopy;  Laterality: N/A;  . Laparoscopic partial colectomy N/A 05/07/2015    Procedure: LAPAROSCOPIC ASSITED PARTIAL COLECTOMY WITH COLOSTOMY, SMALL BOWEL RESECTION, EXCISION OF PERITONEAL NODULE;  Surgeon: [name], MD;  Location: [facility];  Service: General;  Laterality: N/A;  . Portacath placement Right 05/27/2015    Procedure: INSERTION  PORT-A-CATH;  Surgeon: [name], MD;  Location: [facility];  Service: General;  Laterality: Right;    FAMILY HISTORY Family History  Problem Relation Age of Onset  . Arthritis Mother   . Heart disease Mother   . Stroke Father         ADVANCED DIRECTIVES:  Patient does not have any living will or healthcare power of attorney.  Information was given .  Available resources had been discussed.  We will follow-up on subsequent appointments regarding this issue  HEALTH MAINTENANCE: Social History  Substance Use Topics  . Smoking status: Passive Smoke Exposure - Never Smoker  . Smokeless tobacco: Never Used  . Alcohol Use: No      Allergies  Allergen Reactions  . Fentanyl Nausea And Vomiting    IV med  . Versed [Midazolam] Nausea And Vomiting  .  Gabapentin     Swallowing problems  . Paba Derivatives Nausea And Vomiting    Pt states he is allergic to unknown anesthesia. Pt has nausea and vomiting with anesthesia.     Current Outpatient Prescriptions  Medication Sig Dispense Refill  . AMBIEN 10 MG tablet Take 10 mg by mouth at bedtime as needed for sleep.   4  . fentaNYL (DURAGESIC - DOSED MCG/HR) 50 MCG/HR Place 1 patch (50 mcg total) onto the skin every 3 (three) days. 10 patch 0  . HYDROcodone-acetaminophen (NORCO/VICODIN) 5-325 MG tablet TAKE 1 TO 2 TABLETS EVERY 4 HOURS AS NEEDED FOR MODERATE PAIN  0  . lidocaine-prilocaine (EMLA) cream Apply 1 application topically as needed. 30 g 3  . ondansetron (ZOFRAN) 4 MG tablet Take 1 tablet (4 mg total) by mouth every 6 (six) hours as needed for nausea. 20 tablet 0  . oxyCODONE (OXY IR/ROXICODONE) 5 MG immediate release tablet Take 1 tablet (5 mg total) by mouth every 4 (four) hours as needed for severe pain. 60 tablet 0   No current facility-administered medications for this visit.    OBJECTIVE: PHYSICAL EXAM: GENERAL:  Well developed, well nourished, Patient is in somewhat pain and in distress. MENTAL STATUS:  Alert and  oriented to person, place and time.   RESPIRATORY:  Clear to auscultation without rales, wheezes or rhonchi. CARDIOVASCULAR:  Regular rate and rhythm without murmur, rub or gallop.  ABDOMEN:  Soft, non-tender, with active bowel sounds, and no hepatosplenomegaly.  No masses. Wound is still draining. Patient is putting Neosporin Colostomy functioning well BACK:  No CVA tenderness.  No tenderness on percussion of the back or rib cage. SKIN:  No rashes, ulcers or lesions. EXTREMITIES: No edema, no skin discoloration or tenderness.  No palpable cords. LYMPH NODES: No palpable cervical, supraclavicular, axillary or inguinal adenopathy  NEUROLOGICAL: Unremarkable. PSYCH:  depression  Filed Vitals:   07/17/15 0921  BP: 155/105  Pulse: 90  Temp: 96.7 F (35.9 C)     Body mass index is 40.42 kg/(m^2).    ECOG FS:1 - Symptomatic but completely ambulatory  LAB RESULTS:  Infusion on 07/17/2015  Component Date Value Ref Range Status  . WBC 07/17/2015 6.5  3.8 - 10.6 K/uL Final  . RBC 07/17/2015 4.50  4.40 - 5.90 MIL/uL Final  . Hemoglobin 07/17/2015 11.2* 13.0 - 18.0 g/dL Final  . HCT 07/17/2015 35.2* 40.0 - 52.0 % Final  . MCV 07/17/2015 78.2* 80.0 - 100.0 fL Final  . MCH 07/17/2015 24.8* 26.0 - 34.0 pg Final  . MCHC 07/17/2015 31.7* 32.0 - 36.0 g/dL Final  . RDW 07/17/2015 21.5* 11.5 - 14.5 % Final  . Platelets 07/17/2015 214  150 - 440 K/uL Final  . Neutrophils Relative % 07/17/2015 69   Final  . Neutro Abs 07/17/2015 4.5  1.4 - 6.5 K/uL Final  . Lymphocytes Relative 07/17/2015 21   Final  . Lymphs Abs 07/17/2015 1.3  1.0 - 3.6 K/uL Final  . Monocytes Relative 07/17/2015 8   Final  . Monocytes Absolute 07/17/2015 0.5  0.2 - 1.0 K/uL Final  . Eosinophils Relative 07/17/2015 1   Final  . Eosinophils Absolute 07/17/2015 0.1  0 - 0.7 K/uL Final  . Basophils Relative 07/17/2015 1   Final  . Basophils Absolute 07/17/2015 0.0  0 - 0.1 K/uL Final  . Sodium 07/17/2015 137  135 - 145 mmol/L  Final  . Potassium 07/17/2015 3.7  3.5 - 5.1 mmol/L Final  . Chloride  07/17/2015 105  101 - 111 mmol/L Final  . CO2 07/17/2015 26  22 - 32 mmol/L Final  . Glucose, Bld 07/17/2015 106* 65 - 99 mg/dL Final  . BUN 07/17/2015 9  6 - 20 mg/dL Final  . Creatinine, Ser 07/17/2015 0.99  0.61 - 1.24 mg/dL Final  . Calcium 07/17/2015 8.5* 8.9 - 10.3 mg/dL Final  . Total Protein 07/17/2015 7.2  6.5 - 8.1 g/dL Final  . Albumin 07/17/2015 3.7  3.5 - 5.0 g/dL Final  . AST 07/17/2015 28  15 - 41 U/L Final  . ALT 07/17/2015 27  17 - 63 U/L Final  . Alkaline Phosphatase 07/17/2015 93  38 - 126 U/L Final  . Total Bilirubin 07/17/2015 0.3  0.3 - 1.2 mg/dL Final  . GFR calc non Af Amer 07/17/2015 >60  >60 mL/min Final  . GFR calc Af Amer 07/17/2015 >60  >60 mL/min Final   Comment: (NOTE) The eGFR has been calculated using the CKD EPI equation. This calculation has not been validated in all clinical situations. eGFR's persistently <60 mL/min signify possible Chronic Kidney Disease.   . Anion gap 07/17/2015 6  5 - 15 Final  . Magnesium 07/17/2015 2.0  1.7 - 2.4 mg/dL Final     STUDIES: No results found.  ASSESSMENT:  Carcinoma of distal descending colon T4 b(small bowel involvement) N1 B (2 of 15 lymph nodes positive)   M1 B2 tumor(  Peritoneal nodules) 2.sarcoidosis with involvement of mediastinal lymph node and axillary lymph node by history. 3. No significant family history of any colon cancer or any other malignancy 4. Preop CEA was less than 1 5.Preop CT scan has been reviewed does not show any evidence of liver metastases 6. Patient did not have colonoscopy 7. MSI stable 8.  K-ras not mutated by Foundation   One study   PLAN:   Continue chemotherapy with FOLFIRI, and a Avastin from next chemotherapy treatment as patient's wound is healed well various side effects including proteinuria as well as hypertension has been discussed Informed consent to be obtained     Patient expressed  understanding and was in agreement with this plan. He also understands that He can call clinic at any time with any questions, concerns, or complaints.    No matching staging information was found for the patient.  Forest Gleason, MD   07/18/2015 7:00 PM

## 2015-07-19 ENCOUNTER — Inpatient Hospital Stay: Payer: BLUE CROSS/BLUE SHIELD

## 2015-07-19 VITALS — BP 138/89 | HR 88 | Temp 96.4°F | Resp 18

## 2015-07-19 DIAGNOSIS — C186 Malignant neoplasm of descending colon: Secondary | ICD-10-CM | POA: Diagnosis not present

## 2015-07-19 MED ORDER — HEPARIN SOD (PORK) LOCK FLUSH 100 UNIT/ML IV SOLN
500.0000 [IU] | Freq: Once | INTRAVENOUS | Status: AC | PRN
Start: 2015-07-19 — End: 2015-07-19
  Administered 2015-07-19: 500 [IU]

## 2015-07-19 MED ORDER — SODIUM CHLORIDE 0.9 % IJ SOLN
10.0000 mL | INTRAMUSCULAR | Status: DC | PRN
Start: 2015-07-19 — End: 2015-07-19
  Administered 2015-07-19: 10 mL
  Filled 2015-07-19: qty 10

## 2015-07-24 ENCOUNTER — Telehealth: Payer: Self-pay | Admitting: *Deleted

## 2015-07-24 DIAGNOSIS — C186 Malignant neoplasm of descending colon: Secondary | ICD-10-CM

## 2015-07-24 MED ORDER — OXYCODONE HCL 5 MG PO TABS: 5.0000 mg | ORAL_TABLET | ORAL | Status: DC | PRN

## 2015-07-24 NOTE — Telephone Encounter (Signed)
rx for oxycodone ready for pick up at registration.

## 2015-07-31 ENCOUNTER — Inpatient Hospital Stay: Payer: BLUE CROSS/BLUE SHIELD

## 2015-07-31 ENCOUNTER — Inpatient Hospital Stay (HOSPITAL_BASED_OUTPATIENT_CLINIC_OR_DEPARTMENT_OTHER): Payer: BLUE CROSS/BLUE SHIELD | Admitting: Oncology

## 2015-07-31 ENCOUNTER — Encounter: Payer: Self-pay | Admitting: Oncology

## 2015-07-31 VITALS — BP 138/94 | HR 87 | Temp 97.1°F | Wt 287.5 lb

## 2015-07-31 DIAGNOSIS — C186 Malignant neoplasm of descending colon: Secondary | ICD-10-CM

## 2015-07-31 DIAGNOSIS — Z933 Colostomy status: Secondary | ICD-10-CM

## 2015-07-31 DIAGNOSIS — Z9049 Acquired absence of other specified parts of digestive tract: Secondary | ICD-10-CM

## 2015-07-31 DIAGNOSIS — Z79899 Other long term (current) drug therapy: Secondary | ICD-10-CM | POA: Diagnosis not present

## 2015-07-31 DIAGNOSIS — I10 Essential (primary) hypertension: Secondary | ICD-10-CM | POA: Diagnosis not present

## 2015-07-31 LAB — COMPREHENSIVE METABOLIC PANEL
ALT: 28 U/L (ref 17–63)
ANION GAP: 9 (ref 5–15)
AST: 24 U/L (ref 15–41)
Albumin: 3.7 g/dL (ref 3.5–5.0)
Alkaline Phosphatase: 84 U/L (ref 38–126)
BUN: 7 mg/dL (ref 6–20)
CALCIUM: 8.7 mg/dL — AB (ref 8.9–10.3)
CHLORIDE: 103 mmol/L (ref 101–111)
CO2: 25 mmol/L (ref 22–32)
CREATININE: 0.92 mg/dL (ref 0.61–1.24)
Glucose, Bld: 101 mg/dL — ABNORMAL HIGH (ref 65–99)
Potassium: 3.3 mmol/L — ABNORMAL LOW (ref 3.5–5.1)
SODIUM: 137 mmol/L (ref 135–145)
Total Bilirubin: 0.4 mg/dL (ref 0.3–1.2)
Total Protein: 7 g/dL (ref 6.5–8.1)

## 2015-07-31 LAB — CBC WITH DIFFERENTIAL/PLATELET
Basophils Absolute: 0.1 10*3/uL (ref 0–0.1)
Basophils Relative: 1 %
EOS ABS: 0.1 10*3/uL (ref 0–0.7)
EOS PCT: 2 %
HCT: 33.5 % — ABNORMAL LOW (ref 40.0–52.0)
Hemoglobin: 10.9 g/dL — ABNORMAL LOW (ref 13.0–18.0)
LYMPHS ABS: 1.2 10*3/uL (ref 1.0–3.6)
LYMPHS PCT: 28 %
MCH: 25.5 pg — AB (ref 26.0–34.0)
MCHC: 32.6 g/dL (ref 32.0–36.0)
MCV: 78.1 fL — AB (ref 80.0–100.0)
MONO ABS: 0.5 10*3/uL (ref 0.2–1.0)
MONOS PCT: 12 %
Neutro Abs: 2.5 10*3/uL (ref 1.4–6.5)
Neutrophils Relative %: 57 %
PLATELETS: 231 10*3/uL (ref 150–440)
RBC: 4.29 MIL/uL — ABNORMAL LOW (ref 4.40–5.90)
RDW: 19.6 % — ABNORMAL HIGH (ref 11.5–14.5)
WBC: 4.5 10*3/uL (ref 3.8–10.6)

## 2015-07-31 LAB — MAGNESIUM: MAGNESIUM: 2 mg/dL (ref 1.7–2.4)

## 2015-07-31 MED ORDER — SODIUM CHLORIDE 0.9 % IJ SOLN
10.0000 mL | INTRAMUSCULAR | Status: DC | PRN
  Filled 2015-07-31: qty 10

## 2015-07-31 MED ORDER — IRINOTECAN HCL CHEMO INJECTION 100 MG/5ML
150.0000 mg/m2 | Freq: Once | INTRAVENOUS | Status: AC
  Administered 2015-07-31: 292 mg via INTRAVENOUS
  Filled 2015-07-31: qty 14.6

## 2015-07-31 MED ORDER — SODIUM CHLORIDE 0.9 % IJ SOLN
10.0000 mL | Freq: Once | INTRAMUSCULAR | Status: AC
  Administered 2015-07-31: 10 mL via INTRAVENOUS
  Filled 2015-07-31: qty 10

## 2015-07-31 MED ORDER — POTASSIUM CHLORIDE 2 MEQ/ML IV SOLN
Freq: Once | INTRAVENOUS | Status: AC
  Administered 2015-07-31: 10:00:00 via INTRAVENOUS
  Filled 2015-07-31: qty 100

## 2015-07-31 MED ORDER — ATROPINE SULFATE 0.4 MG/ML IJ SOLN
0.4000 mg | Freq: Once | INTRAMUSCULAR | Status: AC | PRN
  Administered 2015-07-31: 0.4 mg via INTRAVENOUS
  Filled 2015-07-31: qty 1

## 2015-07-31 MED ORDER — SODIUM CHLORIDE 0.9 % IV SOLN
2400.0000 mg/m2 | INTRAVENOUS | Status: DC
  Administered 2015-07-31: 4650 mg via INTRAVENOUS
  Filled 2015-07-31: qty 93

## 2015-07-31 MED ORDER — SODIUM CHLORIDE 0.9 % IV SOLN
Freq: Once | INTRAVENOUS | Status: AC
  Administered 2015-07-31: 11:00:00 via INTRAVENOUS
  Filled 2015-07-31: qty 1000

## 2015-07-31 MED ORDER — LISINOPRIL 10 MG PO TABS: 10.0000 mg | ORAL_TABLET | Freq: Every day | ORAL | Status: DC

## 2015-07-31 MED ORDER — FLUOROURACIL CHEMO INJECTION 2.5 GM/50ML
400.0000 mg/m2 | Freq: Once | INTRAVENOUS | Status: AC
  Administered 2015-07-31: 800 mg via INTRAVENOUS
  Filled 2015-07-31: qty 16

## 2015-07-31 MED ORDER — OXYCODONE HCL 5 MG PO TABS: 5.0000 mg | ORAL_TABLET | ORAL | Status: DC | PRN

## 2015-07-31 MED ORDER — LEUCOVORIN CALCIUM INJECTION 350 MG
800.0000 mg | Freq: Once | INTRAVENOUS | Status: AC
  Administered 2015-07-31: 800 mg via INTRAVENOUS
  Filled 2015-07-31: qty 15

## 2015-07-31 MED ORDER — DEXAMETHASONE SODIUM PHOSPHATE 100 MG/10ML IJ SOLN
Freq: Once | INTRAMUSCULAR | Status: AC
  Administered 2015-07-31: 11:00:00 via INTRAVENOUS
  Filled 2015-07-31: qty 8

## 2015-07-31 NOTE — Progress Notes (Signed)
[name] @ [facility] [phone]  Fax:([phone]) [place]  [name] OB: 28-Jun-1961  MR#: [id number]  HLK#:[id number]  Patient Care Team: [name], MD as PCP - General (Family Medicine) [name], MD as Consulting Physician (General Surgery)  CHIEF COMPLAINT:  Chief Complaint  Patient presents with  . 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 Specimen integrity: Intact. Macroscopic tumor perforation: Not identified. Invasive tumor: Maximum size: 5.5 cm. Histologic type(s): Invasive adenocarcinoma. Histologic grade and differentiation: G1: well differentiated/low grade  Pathologic Staging: pT4b, pN1b, pM1b Ancillary studies MSI stable K-ras wild-type not mutated by Foundation study.    2.Patient was started on FOLFOX in September of 2016,after 2 cycles of chemotherapy patient developed neuropathy.  Patient had a previous neuropathy as a baseline which increased so was switched over to  FOLFIRI I  IN December of 2016 As patient has clear os wild-type we can proceed to add either a Avastin or cetuximab from the next chemotherapy.(November, 2016)  VISIT DIAGNOSIS:   No diagnosis found.    No history exists.    Oncology Flowsheet 06/02/2015 06/16/2015 06/16/2015 07/04/2015 07/04/2015 07/17/2015 07/17/2015  Day, Cycle   Day 1, Cycle 2   Day 1, Cycle 1   Day 1, Cycle 2    dexamethasone (DECADRON) IJ - - - - - - -  dexamethasone (DECADRON) IV   [ 12 mg ]   [ 20 mg ]   [ 20 mg ]    enoxaparin (LOVENOX) [place] - - - - - - -  ferumoxytol (FERAHEME) IV - - - - - - -  fluorouracil (ADRUCIL) IV 2,400 mg/m2 400 mg/m2 2,400 mg/m2 400 mg/m2 2,400 mg/m2 400 mg/m2 2,400 mg/m2  fosaprepitant (EMEND) IV   [ 150 mg ]   - - - -  irinotecan (CAMPTOSAR) IV - - - 125 mg/m2   150 mg/m2    leucovorin IV   1,000 mg   1,000 mg   800 mg    ondansetron (ZOFRAN) IJ - - -  - - - -  ondansetron (ZOFRAN) IV - - - [ 16 mg ]   [ 16 mg ]    oxaliplatin (ELOXATIN) IV   85 mg/m2   - - - -  palonosetron (ALOXI) IV   0.25 mg   - - - -    INTERVAL HISTORY: 54 year old gentleman is somewhat depressed.  Started having increasing pain in the left lower abdominal area since March of 2016.  Patient was admitted in the hospital CT scan revealed possibility of diverticulitis versus obstructing lesion patient was treated with antibiotics. Patient continues to have abdominal pain.  Drainage from abdominal wound.  Patient is taking frequent Vicodin.  Using fentanyl patch 12 g.  No nausea no   Abdominal pain has stabilized with present pain management.  No diarrhea.  No tingling numbness.  Neuropathy also has improved. Patient cannot get a Avastin today because of high blood pressure.  Patient was taking antihypertensive medication but has stopped it a while ago. Here for further follow-up and treatment consideration    REVIEW OF SYSTEMS:   Review of system: GENERAL:Patient is feeling somewhat depressed.  PERFORMANCE STATUS (ECOG):  01 HEENT:  No visual changes, runny nose, sore throat, mouth sores or tenderness. Lungs: No shortness of breath or cough.  No hemoptysis. Cardiac:  No chest pain, palpitations, orthopnea, or PND. GI:  No nausea,  vomiting, diarrhea, constipation, melena or hematochezia.   small area in abdominal wound is still healing  Has colostomy.  Difficulty dealing with colostomy.  Persistent abdominal pain requiring hydrocodone to control pain Abdominal wound is healing.  Per tenderness present.  Some drainage which is significant.  Tenderness around that area. Musculoskeletal:  No back pain.  No joint pain.  No muscle tenderness. GU: No hematuria. Extremities:  No pain or swelling. Skin:  No rashes or skin changes. Neuro:  Numbness in upper and lower extremity as well as on the face has been stabilized. Endocrine:  No diabetes, thyroid issues, hot flashes  or night sweats. Psych:  No mood changes, depression or anxiety. Pain:  No focal pain.  Patient had only one episode of diarrhea No significant abdominal crampy pain Patient has a history of sarcoidosis diagnosis in 2014 Was given Lyrica for neuropathy.  But patient has discontinued that because of progressive joint pains Review of systems:  All other systems reviewed and found to be negative.   As per HPI. Otherwise, a complete review of systems is negatve.  PAST MEDICAL HISTORY: Past Medical History  Diagnosis Date  . Sarcoid ([place]) 10/2012  . Neuropathy ([place])   . 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 Besnier-Boeck disease ([place]) 12/21/2012  . Extreme obesity ([place]) 12/21/2012  . LBP (low back pain) 05/28/2014  . Adiposity 03/21/2015  . PONV (postoperative nausea and vomiting)   . Iron deficiency anemia due to chronic blood loss 05/08/2015  . Hypertension     has been off BP med x 6 months  . Arthritis   . GERD (gastroesophageal reflux disease)   . Abdominal pain     mid abdomen  . History of kidney stones   . History of transfusion   . Colon cancer ([place])   . Skin cancer     "it flairs up with sarcoidosis"    PAST SURGICAL HISTORY: Past Surgical History  Procedure Laterality Date  . Cardiac catheterization    . Flexible sigmoidoscopy N/A 05/07/2015    Procedure: FLEXIBLE SIGMOIDOSCOPY;  Surgeon: Laurence Spates, MD;  Location: [facility] ENDOSCOPY;  Service: Endoscopy;  Laterality: N/A;  . Laparoscopic partial colectomy N/A 05/07/2015    Procedure: LAPAROSCOPIC ASSITED PARTIAL COLECTOMY WITH COLOSTOMY, SMALL BOWEL RESECTION, EXCISION OF PERITONEAL NODULE;  Surgeon: [name], MD;  Location: [facility] ORS;  Service: General;  Laterality: N/A;  . Portacath placement Right 05/27/2015    Procedure: INSERTION PORT-A-CATH;  Surgeon: [name], MD;  Location: [facility] ORS;  Service: General;  Laterality: Right;    FAMILY HISTORY Family History  Problem Relation Age of Onset   . Arthritis Mother   . Heart disease Mother   . Stroke Father         ADVANCED DIRECTIVES:  Patient does not have any living will or healthcare power of attorney.  Information was given .  Available resources had been discussed.  We will follow-up on subsequent appointments regarding this issue  HEALTH MAINTENANCE: Social History  Substance Use Topics  . Smoking status: Passive Smoke Exposure - Never Smoker  . Smokeless tobacco: Never Used  . Alcohol Use: No      Allergies  Allergen Reactions  . Fentanyl Nausea And Vomiting    IV med  . Versed [Midazolam] Nausea And Vomiting  . Gabapentin     Swallowing problems  . Paba Derivatives Nausea And Vomiting    Pt states he is allergic to unknown anesthesia. Pt has nausea and vomiting  with anesthesia.     Current Outpatient Prescriptions  Medication Sig Dispense Refill  . AMBIEN 10 MG tablet Take 10 mg by mouth at bedtime as needed for sleep.   4  . fentaNYL (DURAGESIC - DOSED MCG/HR) 50 MCG/HR Place 1 patch (50 mcg total) onto the skin every 3 (three) days. 10 patch 0  . lidocaine-prilocaine (EMLA) cream Apply 1 application topically as needed. 30 g 3  . ondansetron (ZOFRAN) 4 MG tablet Take 1 tablet (4 mg total) by mouth every 6 (six) hours as needed for nausea. 20 tablet 0  . oxyCODONE (OXY IR/ROXICODONE) 5 MG immediate release tablet Take 1 tablet (5 mg total) by mouth every 4 (four) hours as needed for severe pain. 60 tablet 0   No current facility-administered medications for this visit.    OBJECTIVE: PHYSICAL EXAM: GENERAL:  Well developed, well nourished, Patient is in somewhat pain and in distress. MENTAL STATUS:  Alert and oriented to person, place and time.   RESPIRATORY:  Clear to auscultation without rales, wheezes or rhonchi. CARDIOVASCULAR:  Regular rate and rhythm without murmur, rub or gallop.  ABDOMEN:  Soft, non-tender, with active bowel sounds, and no hepatosplenomegaly.  No masses. Wound is still  draining. Patient is putting Neosporin Colostomy functioning well BACK:  No CVA tenderness.  No tenderness on percussion of the back or rib cage. SKIN:  No rashes, ulcers or lesions. EXTREMITIES: No edema, no skin discoloration or tenderness.  No palpable cords. LYMPH NODES: No palpable cervical, supraclavicular, axillary or inguinal adenopathy  NEUROLOGICAL: Unremarkable. PSYCH:  depression  Filed Vitals:   07/31/15 0917  BP: 161/99  Pulse: 87  Temp: 97.1 F (36.2 C)     Body mass index is 40.11 kg/(m^2).    ECOG FS:1 - Symptomatic but completely ambulatory  LAB RESULTS:  Infusion on 07/31/2015  Component Date Value Ref Range Status  . WBC 07/31/2015 4.5  3.8 - 10.6 K/uL Final  . RBC 07/31/2015 4.29* 4.40 - 5.90 MIL/uL Final  . Hemoglobin 07/31/2015 10.9* 13.0 - 18.0 g/dL Final  . HCT 07/31/2015 33.5* 40.0 - 52.0 % Final  . MCV 07/31/2015 78.1* 80.0 - 100.0 fL Final  . MCH 07/31/2015 25.5* 26.0 - 34.0 pg Final  . MCHC 07/31/2015 32.6  32.0 - 36.0 g/dL Final  . RDW 07/31/2015 19.6* 11.5 - 14.5 % Final  . Platelets 07/31/2015 231  150 - 440 K/uL Final  . Neutrophils Relative % 07/31/2015 57   Final  . Neutro Abs 07/31/2015 2.5  1.4 - 6.5 K/uL Final  . Lymphocytes Relative 07/31/2015 28   Final  . Lymphs Abs 07/31/2015 1.2  1.0 - 3.6 K/uL Final  . Monocytes Relative 07/31/2015 12   Final  . Monocytes Absolute 07/31/2015 0.5  0.2 - 1.0 K/uL Final  . Eosinophils Relative 07/31/2015 2   Final  . Eosinophils Absolute 07/31/2015 0.1  0 - 0.7 K/uL Final  . Basophils Relative 07/31/2015 1   Final  . Basophils Absolute 07/31/2015 0.1  0 - 0.1 K/uL Final  . Sodium 07/31/2015 137  135 - 145 mmol/L Final  . Potassium 07/31/2015 3.3* 3.5 - 5.1 mmol/L Final  . Chloride 07/31/2015 103  101 - 111 mmol/L Final  . CO2 07/31/2015 25  22 - 32 mmol/L Final  . Glucose, Bld 07/31/2015 101* 65 - 99 mg/dL Final  . BUN 07/31/2015 7  6 - 20 mg/dL Final  . Creatinine, Ser 07/31/2015 0.92  0.61 -  1.24 mg/dL Final  .  Calcium 07/31/2015 8.7* 8.9 - 10.3 mg/dL Final  . Total Protein 07/31/2015 7.0  6.5 - 8.1 g/dL Final  . Albumin 07/31/2015 3.7  3.5 - 5.0 g/dL Final  . AST 07/31/2015 24  15 - 41 U/L Final  . ALT 07/31/2015 28  17 - 63 U/L Final  . Alkaline Phosphatase 07/31/2015 84  38 - 126 U/L Final  . Total Bilirubin 07/31/2015 0.4  0.3 - 1.2 mg/dL Final  . GFR calc non Af Amer 07/31/2015 >60  >60 mL/min Final  . GFR calc Af Amer 07/31/2015 >60  >60 mL/min Final   Comment: (NOTE) The eGFR has been calculated using the CKD EPI equation. This calculation has not been validated in all clinical situations. eGFR's persistently <60 mL/min signify possible Chronic Kidney Disease.   . Anion gap 07/31/2015 9  5 - 15 Final  . Magnesium 07/31/2015 2.0  1.7 - 2.4 mg/dL Final     STUDIES: No results found.  ASSESSMENT:  Carcinoma of distal descending colon T4 b(small bowel involvement) N1 B (2 of 15 lymph nodes positive)   M1 B2 tumor(  Peritoneal nodules) 2.sarcoidosis with involvement of mediastinal lymph node and axillary lymph node by history. 3. No significant family history of any colon cancer or any other malignancy 4. Preop CEA was less than 1 5.Preop CT scan has been reviewed does not show any evidence of liver metastases 6. Patient did not have colonoscopy 7. MSI stable 8.  K-ras not mutated by Foundation   One study   PLAN:   Hold a Avastin today because of high blood pressure patient was advised to take lisinopril 10 mg daily Resume a Avastin from next cycle.  Continue chemotherapy with FOLFIRI   Patient expressed understanding and was in agreement with this plan. He also understands that He can call clinic at any time with any questions, concerns, or complaints.    No matching staging information was found for the patient.  [name] Gleason, MD   07/31/2015 9:43 AM

## 2015-07-31 NOTE — Addendum Note (Signed)
Addended by: Telford Nab on: 07/31/2015 09:59 AM   Modules accepted: Orders

## 2015-07-31 NOTE — Addendum Note (Signed)
Addended by: Telford Nab on: 07/31/2015 10:15 AM   Modules accepted: Orders

## 2015-08-02 ENCOUNTER — Inpatient Hospital Stay: Payer: BLUE CROSS/BLUE SHIELD

## 2015-08-02 VITALS — BP 114/78 | HR 90 | Temp 97.0°F | Resp 18

## 2015-08-02 DIAGNOSIS — C186 Malignant neoplasm of descending colon: Secondary | ICD-10-CM | POA: Diagnosis not present

## 2015-08-02 MED ORDER — HEPARIN SOD (PORK) LOCK FLUSH 100 UNIT/ML IV SOLN
INTRAVENOUS | Status: AC
  Filled 2015-08-02: qty 5

## 2015-08-02 MED ORDER — SODIUM CHLORIDE 0.9 % IJ SOLN
10.0000 mL | INTRAMUSCULAR | Status: DC | PRN
  Administered 2015-08-02: 10 mL
  Filled 2015-08-02: qty 10

## 2015-08-02 MED ORDER — HEPARIN SOD (PORK) LOCK FLUSH 100 UNIT/ML IV SOLN
500.0000 [IU] | Freq: Once | INTRAVENOUS | Status: AC | PRN
  Administered 2015-08-02: 500 [IU]

## 2015-08-08 ENCOUNTER — Telehealth: Payer: Self-pay | Admitting: Hematology and Oncology

## 2015-08-08 NOTE — Telephone Encounter (Signed)
Re:  Pain medication refill  Patient called about getting his pain medication refilled.  He will run out on Sunday.  I called his pharmacy ([facility]).  The pharmacist noted that he has refilled his pain medications ahead of time.  He has received 120 pills since 07/24/2015 and shouldn't run out until 08/13/2015.  I asked the patient how he took his pain pills.  He states that he took them whenever he needed them.  Sometimes every 3 hours.  I stated that I could not fill them.  If he needed more pills, he would need to see his PCP or the ER.  I encouraged him to talk to Dr. Oliva Bustard or his nurse about his pain medication prescription.  Lequita Asal, MD

## 2015-08-11 ENCOUNTER — Other Ambulatory Visit: Payer: Self-pay | Admitting: *Deleted

## 2015-08-11 DIAGNOSIS — C186 Malignant neoplasm of descending colon: Secondary | ICD-10-CM

## 2015-08-11 MED ORDER — OXYCODONE HCL 5 MG PO TABS: 5.0000 mg | ORAL_TABLET | ORAL | Status: DC | PRN

## 2015-08-11 NOTE — Telephone Encounter (Signed)
Asking for refill on Oxycodone 5 mg, last got # 60 on 11/17

## 2015-08-14 ENCOUNTER — Inpatient Hospital Stay: Payer: BLUE CROSS/BLUE SHIELD

## 2015-08-14 ENCOUNTER — Inpatient Hospital Stay (HOSPITAL_BASED_OUTPATIENT_CLINIC_OR_DEPARTMENT_OTHER): Payer: BLUE CROSS/BLUE SHIELD | Admitting: Oncology

## 2015-08-14 ENCOUNTER — Inpatient Hospital Stay: Payer: BLUE CROSS/BLUE SHIELD | Attending: Oncology

## 2015-08-14 VITALS — BP 111/79 | HR 88 | Temp 98.5°F | Ht 71.0 in | Wt 284.2 lb

## 2015-08-14 DIAGNOSIS — Z87442 Personal history of urinary calculi: Secondary | ICD-10-CM | POA: Insufficient documentation

## 2015-08-14 DIAGNOSIS — Z5111 Encounter for antineoplastic chemotherapy: Secondary | ICD-10-CM | POA: Diagnosis not present

## 2015-08-14 DIAGNOSIS — C186 Malignant neoplasm of descending colon: Secondary | ICD-10-CM

## 2015-08-14 DIAGNOSIS — D861 Sarcoidosis of lymph nodes: Secondary | ICD-10-CM | POA: Diagnosis not present

## 2015-08-14 DIAGNOSIS — Z933 Colostomy status: Secondary | ICD-10-CM | POA: Insufficient documentation

## 2015-08-14 DIAGNOSIS — Z79899 Other long term (current) drug therapy: Secondary | ICD-10-CM

## 2015-08-14 DIAGNOSIS — E669 Obesity, unspecified: Secondary | ICD-10-CM | POA: Diagnosis not present

## 2015-08-14 DIAGNOSIS — K219 Gastro-esophageal reflux disease without esophagitis: Secondary | ICD-10-CM | POA: Insufficient documentation

## 2015-08-14 DIAGNOSIS — I10 Essential (primary) hypertension: Secondary | ICD-10-CM | POA: Insufficient documentation

## 2015-08-14 DIAGNOSIS — E876 Hypokalemia: Secondary | ICD-10-CM | POA: Diagnosis not present

## 2015-08-14 LAB — COMPREHENSIVE METABOLIC PANEL
ALT: 26 U/L (ref 17–63)
AST: 25 U/L (ref 15–41)
Albumin: 3.9 g/dL (ref 3.5–5.0)
Alkaline Phosphatase: 88 U/L (ref 38–126)
Anion gap: 9 (ref 5–15)
BILIRUBIN TOTAL: 0.7 mg/dL (ref 0.3–1.2)
BUN: 13 mg/dL (ref 6–20)
CHLORIDE: 104 mmol/L (ref 101–111)
CO2: 25 mmol/L (ref 22–32)
CREATININE: 1.07 mg/dL (ref 0.61–1.24)
Calcium: 8.7 mg/dL — ABNORMAL LOW (ref 8.9–10.3)
Glucose, Bld: 120 mg/dL — ABNORMAL HIGH (ref 65–99)
Potassium: 3.1 mmol/L — ABNORMAL LOW (ref 3.5–5.1)
Sodium: 138 mmol/L (ref 135–145)
TOTAL PROTEIN: 7.2 g/dL (ref 6.5–8.1)

## 2015-08-14 LAB — CBC WITH DIFFERENTIAL/PLATELET
Basophils Absolute: 0.1 10*3/uL (ref 0–0.1)
Basophils Relative: 1 %
EOS PCT: 2 %
Eosinophils Absolute: 0.1 10*3/uL (ref 0–0.7)
HEMATOCRIT: 34.9 % — AB (ref 40.0–52.0)
Hemoglobin: 11.2 g/dL — ABNORMAL LOW (ref 13.0–18.0)
LYMPHS ABS: 1.1 10*3/uL (ref 1.0–3.6)
LYMPHS PCT: 21 %
MCH: 25.5 pg — AB (ref 26.0–34.0)
MCHC: 32.1 g/dL (ref 32.0–36.0)
MCV: 79.6 fL — AB (ref 80.0–100.0)
MONO ABS: 0.5 10*3/uL (ref 0.2–1.0)
Monocytes Relative: 9 %
Neutro Abs: 3.6 10*3/uL (ref 1.4–6.5)
Neutrophils Relative %: 67 %
PLATELETS: 242 10*3/uL (ref 150–440)
RBC: 4.38 MIL/uL — AB (ref 4.40–5.90)
RDW: 19.3 % — AB (ref 11.5–14.5)
WBC: 5.4 10*3/uL (ref 3.8–10.6)

## 2015-08-14 LAB — PROTEIN, URINE, RANDOM: TOTAL PROTEIN, URINE: 44 mg/dL

## 2015-08-14 LAB — MAGNESIUM: MAGNESIUM: 2.1 mg/dL (ref 1.7–2.4)

## 2015-08-14 MED ORDER — SODIUM CHLORIDE 0.9 % IV SOLN
Freq: Once | INTRAVENOUS | Status: AC
  Administered 2015-08-14: 12:00:00 via INTRAVENOUS
  Filled 2015-08-14: qty 250

## 2015-08-14 MED ORDER — SODIUM CHLORIDE 0.9 % IJ SOLN
10.0000 mL | Freq: Once | INTRAMUSCULAR | Status: AC
  Administered 2015-08-14: 10 mL via INTRAVENOUS
  Filled 2015-08-14: qty 10

## 2015-08-14 MED ORDER — FLUOROURACIL CHEMO INJECTION 2.5 GM/50ML
400.0000 mg/m2 | Freq: Once | INTRAVENOUS | Status: AC
  Administered 2015-08-14: 800 mg via INTRAVENOUS
  Filled 2015-08-14: qty 16

## 2015-08-14 MED ORDER — ATROPINE SULFATE 0.4 MG/ML IJ SOLN
0.4000 mg | Freq: Once | INTRAMUSCULAR | Status: AC | PRN
  Administered 2015-08-14: 0.4 mg via INTRAVENOUS
  Filled 2015-08-14: qty 1

## 2015-08-14 MED ORDER — SODIUM CHLORIDE 0.9 % IV SOLN
Freq: Once | INTRAVENOUS | Status: AC
  Administered 2015-08-14: 11:00:00 via INTRAVENOUS
  Filled 2015-08-14: qty 1000

## 2015-08-14 MED ORDER — HEPARIN SOD (PORK) LOCK FLUSH 100 UNIT/ML IV SOLN: 500.0000 [IU] | Freq: Once | INTRAVENOUS | Status: DC

## 2015-08-14 MED ORDER — SODIUM CHLORIDE 0.9 % IV SOLN
2400.0000 mg/m2 | INTRAVENOUS | Status: DC
  Administered 2015-08-14: 4650 mg via INTRAVENOUS
  Filled 2015-08-14: qty 93

## 2015-08-14 MED ORDER — IRINOTECAN HCL CHEMO INJECTION 100 MG/5ML
150.0000 mg/m2 | Freq: Once | INTRAVENOUS | Status: AC
  Administered 2015-08-14: 292 mg via INTRAVENOUS
  Filled 2015-08-14: qty 4.87

## 2015-08-14 MED ORDER — ONDANSETRON HCL 4 MG PO TABS
4.0000 mg | ORAL_TABLET | Freq: Four times a day (QID) | ORAL | Status: DC | PRN
Start: 2015-08-14 — End: 2015-09-26

## 2015-08-14 MED ORDER — SODIUM CHLORIDE 0.9 % IV SOLN
Freq: Once | INTRAVENOUS | Status: AC
  Administered 2015-08-14: 13:00:00 via INTRAVENOUS
  Filled 2015-08-14: qty 8

## 2015-08-14 MED ORDER — DEXTROSE 5 % IV SOLN
800.0000 mg | Freq: Once | INTRAVENOUS | Status: AC
  Administered 2015-08-14: 800 mg via INTRAVENOUS
  Filled 2015-08-14: qty 40

## 2015-08-14 MED ORDER — HEPARIN SOD (PORK) LOCK FLUSH 100 UNIT/ML IV SOLN: 500.0000 [IU] | Freq: Once | INTRAVENOUS | Status: DC | PRN

## 2015-08-14 MED ORDER — SODIUM CHLORIDE 0.9 % IV SOLN
5.0000 mg/kg | Freq: Once | INTRAVENOUS | Status: AC
  Administered 2015-08-14: 650 mg via INTRAVENOUS
  Filled 2015-08-14: qty 20

## 2015-08-14 NOTE — Progress Notes (Signed)
[place] @ [facility] [phone]  [phone]  [name] OB: 19-Aug-1961  MR#: [id number]  VOH#:[id number]  Patient Care Team: [name], MD as PCP - General (Family Medicine) [name], MD as Consulting Physician (General Surgery)  CHIEF COMPLAINT:  No chief complaint on file.  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 Specimen integrity: Intact. Macroscopic tumor perforation: Not identified. Invasive tumor: Maximum size: 5.5 cm. Histologic type(s): Invasive adenocarcinoma. Histologic grade and differentiation: G1: well differentiated/low grade  Pathologic Staging: pT4b, pN1b, pM1b Ancillary studies MSI stable K-ras wild-type not mutated by Foundation study.    2.Patient was started on FOLFOX in September of 2016,after 2 cycles of chemotherapy patient developed neuropathy.  Patient had a previous neuropathy as a baseline which increased so was switched over to  FOLFIRI I  IN December of 2016 As patient has clear os wild-type we can proceed to add either a Avastin or cetuximab from the next chemotherapy.(November, 2016)  VISIT DIAGNOSIS:   No diagnosis found.    No history exists.    Oncology Flowsheet 06/16/2015 07/04/2015 07/04/2015 07/17/2015 07/17/2015 07/31/2015 07/31/2015  Day, Cycle   Day 1, Cycle 1   Day 1, Cycle 2   Day 1, Cycle 3    dexamethasone (DECADRON) IJ - - - - - - -  dexamethasone (DECADRON) IV   [ 20 mg ]   [ 20 mg ]   [ 20 mg ]    enoxaparin (LOVENOX) [place] - - - - - - -  ferumoxytol (FERAHEME) IV - - - - - - -  fluorouracil (ADRUCIL) IV 2,400 mg/m2 400 mg/m2 2,400 mg/m2 400 mg/m2 2,400 mg/m2 400 mg/m2 2,400 mg/m2  fosaprepitant (EMEND) IV   - - - - - -  irinotecan (CAMPTOSAR) IV - 125 mg/m2   150 mg/m2   150 mg/m2    leucovorin IV   1,000 mg   800 mg   800 mg    ondansetron (ZOFRAN) IJ - - - - - - -  ondansetron  (ZOFRAN) IV - [ 16 mg ]   [ 16 mg ]   [ 16 mg ]    oxaliplatin (ELOXATIN) IV   - - - - - -  palonosetron (ALOXI) IV   - - - - - -    INTERVAL HISTORY: 54 year old gentleman is somewhat depressed.  Started having increasing pain in the left lower abdominal area since March of 2016.  Patient was admitted in the hospital CT scan revealed possibility of diverticulitis versus obstructing lesion patient was treated with antibiotics. Patient continues to have abdominal pain.  Drainage from abdominal wound.  Patient is taking frequent Vicodin.  Using fentanyl patch 12 g.  No nausea no   Abdominal pain has stabilized with present pain management.  No diarrhea.  No tingling numbness.  Neuropathy also has improved. Patient cannot get a Avastin today because of high blood pressure.  Patient was taking antihypertensive medication but has stopped it a while ago. Here for further follow-up and treatment consideration Patient has some numbness over the face.  Lower extremity numbness is stable. No nausea no vomiting no diarrhea   REVIEW OF SYSTEMS:   Review of system: GENERAL:Patient is feeling somewhat depressed.  PERFORMANCE STATUS (ECOG):  01 HEENT:  No visual changes, runny nose, sore throat, mouth sores or tenderness. Lungs: No shortness of breath or cough.  No hemoptysis. Cardiac:  No chest pain, palpitations, orthopnea, or PND. GI:  No nausea, vomiting, diarrhea, constipation, melena or hematochezia.   small area in abdominal wound is still healing  Has colostomy.  Difficulty dealing with colostomy.  Persistent abdominal pain requiring hydrocodone to control pain Abdominal wound is healing.  Per tenderness present.  Some drainage which is significant.  Tenderness around that area. Musculoskeletal:  No back pain.  No joint pain.  No muscle tenderness. GU: No hematuria. Extremities:  No pain or swelling. Skin:  No rashes or skin changes. Neuro:  Numbness in upper and lower extremity as well as  on the face has been stabilized. Endocrine:  No diabetes, thyroid issues, hot flashes or night sweats. Psych:  No mood changes, depression or anxiety. Pain:  No focal pain.  Patient had only one episode of diarrhea No significant abdominal crampy pain Patient has a history of sarcoidosis diagnosis in 2014 Was given Lyrica for neuropathy.  But patient has discontinued that because of progressive joint pains Review of systems:  All other systems reviewed and found to be negative.   As per HPI. Otherwise, a complete review of systems is negatve.  PAST MEDICAL HISTORY: Past Medical History  Diagnosis Date  . Sarcoid ([place]) 10/2012  . Neuropathy ([place])   . 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 Besnier-Boeck disease ([place]) 12/21/2012  . Extreme obesity ([place]) 12/21/2012  . LBP (low back pain) 05/28/2014  . Adiposity 03/21/2015  . PONV (postoperative nausea and vomiting)   . Iron deficiency anemia due to chronic blood loss 05/08/2015  . Hypertension     has been off BP med x 6 months  . Arthritis   . GERD (gastroesophageal reflux disease)   . Abdominal pain     mid abdomen  . History of kidney stones   . History of transfusion   . Colon cancer ([place])   . Skin cancer     "it flairs up with sarcoidosis"    PAST SURGICAL HISTORY: Past Surgical History  Procedure Laterality Date  . Cardiac catheterization    . Flexible sigmoidoscopy N/A 05/07/2015    Procedure: FLEXIBLE SIGMOIDOSCOPY;  Surgeon: Laurence Spates, MD;  Location: [facility] END[facility]COPY;  Service: Endoscopy;  Laterality: N/A;  . Laparoscopic partial colectomy N/A 05/07/2015    Procedure: LAPAROSCOPIC ASSITED PARTIAL COLECTOMY WITH COLOSTOMY, SMALL BOWEL RESECTION, EXCISION OF PERITONEAL NODULE;  Surgeon: [name], MD;  Location: [facility] ORS;  Service: General;  Laterality: N/A;  . Portacath placement Right 05/27/2015    Procedure: INSERTION PORT-A-CATH;  Surgeon: [name], MD;  Location: [facility] OR[facility];  Service: General;   Laterality: Right;    FAMILY HISTORY Family History  Problem Relation Age of Onset  . Arthritis Mother   . Heart disease Mother   . Stroke Father         ADVANCED DIRECTIVES:  Patient does not have any living will or healthcare power of attorney.  Information was given .  Available resources had been discussed.  We will follow-up on subsequent appointments regarding this issue  HEALTH MAINTENANCE: Social History  Substance Use Topics  . Smoking status: Passive Smoke Exposure - Never Smoker  . Smokeless tobacco: Never Used  . Alcohol Use: No      Allergies  Allergen Reactions  . Fentanyl Nausea And Vomiting    IV med  . Versed [Midazolam] Nausea And Vomiting  . Gabapentin     Swallowing problems  . Paba Derivatives Nausea And Vomiting    Pt states  he is allergic to unknown anesthesia. Pt has nausea and vomiting with anesthesia.     Current Outpatient Prescriptions  Medication Sig Dispense Refill  . AMBIEN 10 MG tablet Take 10 mg by mouth at bedtime as needed for sleep.   4  . fentaNYL (DURAGESIC - DOSED MCG/HR) 50 MCG/HR Place 1 patch (50 mcg total) onto the skin every 3 (three) days. 10 patch 0  . lidocaine-prilocaine (EMLA) cream Apply 1 application topically as needed. 30 g 3  . lisinopril (PRINIVIL,ZESTRIL) 10 MG tablet Take 1 tablet (10 mg total) by mouth daily.    . ondansetron (ZOFRAN) 4 MG tablet Take 1 tablet (4 mg total) by mouth every 6 (six) hours as needed for nausea. 20 tablet 0  . oxyCODONE (OXY IR/ROXICODONE) 5 MG immediate release tablet Take 1 tablet (5 mg total) by mouth every 4 (four) hours as needed for severe pain. 60 tablet 0   No current facility-administered medications for this visit.   Facility-Administered Medications Ordered in Other Visits  Medication Dose Route Frequency Provider Last Rate Last Dose  . heparin lock flush 100 unit/mL  500 Units Intravenous Once Forest Gleason, MD      . sodium chloride 0.9 % injection 10 mL  10 mL  Intracatheter PRN Forest Gleason, MD   10 mL at 08/02/15 0955    OBJECTIVE: PHYSICAL EXAM: GENERAL:  Well developed, well nourished, Patient is in somewhat pain and in distress. MENTAL STATUS:  Alert and oriented to person, place and time.   RESPIRATORY:  Clear to auscultation without rales, wheezes or rhonchi. CARDIOVASCULAR:  Regular rate and rhythm without murmur, rub or gallop.  ABDOMEN:  Soft, non-tender, with active bowel sounds, and no hepatosplenomegaly.  No masses. Wound is still draining. Patient is putting Neosporin Colostomy functioning well BACK:  No CVA tenderness.  No tenderness on percussion of the back or rib cage. SKIN:  No rashes, ulcers or lesions. EXTREMITIES: No edema, no skin discoloration or tenderness.  No palpable cords. LYMPH NODES: No palpable cervical, supraclavicular, axillary or inguinal adenopathy  NEUROLOGICAL: Unremarkable. PSYCH:  depression  Filed Vitals:   08/14/15 1024  BP: 111/79  Pulse: 88  Temp: 98.5 F (36.9 C)     Body mass index is 39.65 kg/(m^2).    ECOG FS:1 - Symptomatic but completely ambulatory  LAB RESULTS:  Infusion on 08/14/2015  Component Date Value Ref Range Status  . WBC 08/14/2015 5.4  3.8 - 10.6 K/uL Final  . RBC 08/14/2015 4.38* 4.40 - 5.90 MIL/uL Final  . Hemoglobin 08/14/2015 11.2* 13.0 - 18.0 g/dL Final  . HCT 08/14/2015 34.9* 40.0 - 52.0 % Final  . MCV 08/14/2015 79.6* 80.0 - 100.0 fL Final  . MCH 08/14/2015 25.5* 26.0 - 34.0 pg Final  . MCHC 08/14/2015 32.1  32.0 - 36.0 g/dL Final  . RDW 08/14/2015 19.3* 11.5 - 14.5 % Final  . Platelets 08/14/2015 242  150 - 440 K/uL Final  . Neutrophils Relative % 08/14/2015 67   Final  . Neutro Abs 08/14/2015 3.6  1.4 - 6.5 K/uL Final  . Lymphocytes Relative 08/14/2015 21   Final  . Lymphs Abs 08/14/2015 1.1  1.0 - 3.6 K/uL Final  . Monocytes Relative 08/14/2015 9   Final  . Monocytes Absolute 08/14/2015 0.5  0.2 - 1.0 K/uL Final  . Eosinophils Relative 08/14/2015 2    Final  . Eosinophils Absolute 08/14/2015 0.1  0 - 0.7 K/uL Final  . Basophils Relative 08/14/2015 1  Final  . Basophils Absolute 08/14/2015 0.1  0 - 0.1 K/uL Final  . Sodium 08/14/2015 138  135 - 145 mmol/L Final  . Potassium 08/14/2015 3.1* 3.5 - 5.1 mmol/L Final  . Chloride 08/14/2015 104  101 - 111 mmol/L Final  . CO2 08/14/2015 25  22 - 32 mmol/L Final  . Glucose, Bld 08/14/2015 120* 65 - 99 mg/dL Final  . BUN 08/14/2015 13  6 - 20 mg/dL Final  . Creatinine, Ser 08/14/2015 1.07  0.61 - 1.24 mg/dL Final  . Calcium 08/14/2015 8.7* 8.9 - 10.3 mg/dL Final  . Total Protein 08/14/2015 7.2  6.5 - 8.1 g/dL Final  . Albumin 08/14/2015 3.9  3.5 - 5.0 g/dL Final  . AST 08/14/2015 25  15 - 41 U/L Final  . ALT 08/14/2015 26  17 - 63 U/L Final  . Alkaline Phosphatase 08/14/2015 88  38 - 126 U/L Final  . Total Bilirubin 08/14/2015 0.7  0.3 - 1.2 mg/dL Final  . GFR calc non Af Amer 08/14/2015 >60  >60 mL/min Final  . GFR calc Af Amer 08/14/2015 >60  >60 mL/min Final   Comment: (NOTE) The eGFR has been calculated using the CKD EPI equation. This calculation has not been validated in all clinical situations. eGFR's persistently <60 mL/min signify possible Chronic Kidney Disease.   . Anion gap 08/14/2015 9  5 - 15 Final  . 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 3. No significant family history of any colon cancer or any other malignancy 4. Preop CEA was less than 1 5.Preop CT scan has been reviewed does not show any evidence of liver metastases 6. Patient did not have colonoscopy 7. MSI stable 8.  K-ras not mutated by Foundation   One study   PLAN:   Hold a Avastin today because of high blood pressure patient was advised to take  lisinopril 10 mg daily Resume a Avastin from next cycle.  Continue chemotherapy with FOLFIRI and Avastin. A repeat CT scan for restaging has been ordered. Patient still remains dependent on oxycodone for control of pain   Patient expressed understanding and was in agreement with this plan. He also understands that He can call clinic at any time with any questions, concerns, or complaints.    No matching staging information was found for the patient.  Forest Gleason, MD   08/14/2015 10:42 AM

## 2015-08-15 ENCOUNTER — Encounter: Payer: Self-pay | Admitting: Oncology

## 2015-08-16 ENCOUNTER — Inpatient Hospital Stay: Payer: BLUE CROSS/BLUE SHIELD

## 2015-08-16 VITALS — BP 121/81 | HR 89 | Temp 97.9°F | Resp 18

## 2015-08-16 DIAGNOSIS — C186 Malignant neoplasm of descending colon: Secondary | ICD-10-CM | POA: Diagnosis not present

## 2015-08-16 MED ORDER — SODIUM CHLORIDE 0.9 % IJ SOLN
10.0000 mL | INTRAMUSCULAR | Status: DC | PRN
  Administered 2015-08-16: 10 mL
  Filled 2015-08-16: qty 10

## 2015-08-16 MED ORDER — HEPARIN SOD (PORK) LOCK FLUSH 100 UNIT/ML IV SOLN
500.0000 [IU] | Freq: Once | INTRAVENOUS | Status: AC | PRN
  Administered 2015-08-16: 500 [IU]

## 2015-08-18 ENCOUNTER — Other Ambulatory Visit: Payer: Self-pay | Admitting: *Deleted

## 2015-08-18 DIAGNOSIS — C186 Malignant neoplasm of descending colon: Secondary | ICD-10-CM

## 2015-08-18 MED ORDER — OXYCODONE HCL 5 MG PO TABS: 5.0000 mg | ORAL_TABLET | ORAL | Status: DC | PRN

## 2015-08-18 NOTE — Telephone Encounter (Signed)
Got # [phone] 10 days ago

## 2015-08-21 ENCOUNTER — Telehealth: Payer: Self-pay | Admitting: *Deleted

## 2015-08-21 ENCOUNTER — Ambulatory Visit
Admission: RE | Admit: 2015-08-21 | Discharge: 2015-08-21 | Disposition: A | Discharge status: Home / Self Care | Payer: BLUE CROSS/BLUE SHIELD | Source: Ambulatory Visit | Attending: Oncology | Admitting: Oncology

## 2015-08-21 DIAGNOSIS — C186 Malignant neoplasm of descending colon: Secondary | ICD-10-CM | POA: Diagnosis present

## 2015-08-21 DIAGNOSIS — N132 Hydronephrosis with renal and ureteral calculous obstruction: Secondary | ICD-10-CM | POA: Insufficient documentation

## 2015-08-21 DIAGNOSIS — Z933 Colostomy status: Secondary | ICD-10-CM | POA: Insufficient documentation

## 2015-08-21 DIAGNOSIS — K760 Fatty (change of) liver, not elsewhere classified: Secondary | ICD-10-CM | POA: Diagnosis not present

## 2015-08-21 MED ORDER — IOHEXOL 300 MG/ML  SOLN
100.0000 mL | Freq: Once | INTRAMUSCULAR | Status: AC | PRN
  Administered 2015-08-21: 100 mL via INTRAVENOUS

## 2015-08-21 NOTE — Telephone Encounter (Signed)
I called and spoke with patient regarding CT results.Per Dr Oliva Bustard, chemotherapy will continue for 12 treatments total. I explained to [name] that he does have kidney stones which is partially blocking his kidney and that if he has bloody urine, sudden onset of flank pain or difficulty passing urine to call us or go to ER. He repeated this back to me

## 2015-08-25 ENCOUNTER — Telehealth: Payer: Self-pay | Admitting: *Deleted

## 2015-08-25 DIAGNOSIS — C186 Malignant neoplasm of descending colon: Secondary | ICD-10-CM

## 2015-08-25 MED ORDER — OXYCODONE HCL 5 MG PO TABS: 5.0000 mg | ORAL_TABLET | ORAL | Status: DC | PRN

## 2015-08-25 NOTE — Telephone Encounter (Signed)
Informed that prescription is ready to pick up  

## 2015-08-28 ENCOUNTER — Inpatient Hospital Stay (HOSPITAL_BASED_OUTPATIENT_CLINIC_OR_DEPARTMENT_OTHER): Payer: BLUE CROSS/BLUE SHIELD | Admitting: Oncology

## 2015-08-28 ENCOUNTER — Inpatient Hospital Stay: Payer: BLUE CROSS/BLUE SHIELD

## 2015-08-28 ENCOUNTER — Encounter: Payer: Self-pay | Admitting: Oncology

## 2015-08-28 VITALS — BP 160/99 | HR 95 | Temp 95.0°F | Wt 284.6 lb

## 2015-08-28 DIAGNOSIS — C186 Malignant neoplasm of descending colon: Secondary | ICD-10-CM

## 2015-08-28 DIAGNOSIS — N1330 Unspecified hydronephrosis: Secondary | ICD-10-CM

## 2015-08-28 DIAGNOSIS — N200 Calculus of kidney: Secondary | ICD-10-CM

## 2015-08-28 DIAGNOSIS — D861 Sarcoidosis of lymph nodes: Secondary | ICD-10-CM | POA: Diagnosis not present

## 2015-08-28 DIAGNOSIS — E876 Hypokalemia: Secondary | ICD-10-CM | POA: Diagnosis not present

## 2015-08-28 DIAGNOSIS — Z79899 Other long term (current) drug therapy: Secondary | ICD-10-CM | POA: Diagnosis not present

## 2015-08-28 LAB — CBC WITH DIFFERENTIAL/PLATELET
BASOS ABS: 0 10*3/uL (ref 0–0.1)
BASOS PCT: 1 %
Eosinophils Absolute: 0.1 10*3/uL (ref 0–0.7)
Eosinophils Relative: 3 %
HEMATOCRIT: 33.1 % — AB (ref 40.0–52.0)
HEMOGLOBIN: 10.7 g/dL — AB (ref 13.0–18.0)
LYMPHS PCT: 32 %
Lymphs Abs: 1.2 10*3/uL (ref 1.0–3.6)
MCH: 25.7 pg — ABNORMAL LOW (ref 26.0–34.0)
MCHC: 32.3 g/dL (ref 32.0–36.0)
MCV: 79.6 fL — AB (ref 80.0–100.0)
MONO ABS: 0.4 10*3/uL (ref 0.2–1.0)
MONOS PCT: 11 %
NEUTROS ABS: 2.1 10*3/uL (ref 1.4–6.5)
NEUTROS PCT: 53 %
Platelets: 240 10*3/uL (ref 150–440)
RBC: 4.16 MIL/uL — ABNORMAL LOW (ref 4.40–5.90)
RDW: 18.6 % — AB (ref 11.5–14.5)
WBC: 3.9 10*3/uL (ref 3.8–10.6)

## 2015-08-28 LAB — COMPREHENSIVE METABOLIC PANEL
ALBUMIN: 3.5 g/dL (ref 3.5–5.0)
ALK PHOS: 81 U/L (ref 38–126)
ALT: 22 U/L (ref 17–63)
AST: 23 U/L (ref 15–41)
Anion gap: 10 (ref 5–15)
BILIRUBIN TOTAL: 0.3 mg/dL (ref 0.3–1.2)
BUN: 9 mg/dL (ref 6–20)
CALCIUM: 8.5 mg/dL — AB (ref 8.9–10.3)
CO2: 25 mmol/L (ref 22–32)
Chloride: 103 mmol/L (ref 101–111)
Creatinine, Ser: 0.9 mg/dL (ref 0.61–1.24)
GFR calc Af Amer: >60 mL/min (ref >60)
GLUCOSE: 123 mg/dL — AB (ref 65–99)
Potassium: 2.9 mmol/L — CL (ref 3.5–5.1)
Sodium: 138 mmol/L (ref 135–145)
TOTAL PROTEIN: 6.8 g/dL (ref 6.5–8.1)

## 2015-08-28 LAB — MAGNESIUM: MAGNESIUM: 2 mg/dL (ref 1.7–2.4)

## 2015-08-28 MED ORDER — IRINOTECAN HCL CHEMO INJECTION 100 MG/5ML
150.0000 mg/m2 | Freq: Once | INTRAVENOUS | Status: AC
  Administered 2015-08-28: 292 mg via INTRAVENOUS
  Filled 2015-08-28: qty 4.87

## 2015-08-28 MED ORDER — SODIUM CHLORIDE 0.9 % IV SOLN: Freq: Once | INTRAVENOUS | Status: DC

## 2015-08-28 MED ORDER — SODIUM CHLORIDE 0.9 % IV SOLN
Freq: Once | INTRAVENOUS | Status: AC
  Administered 2015-08-28: 10:00:00 via INTRAVENOUS
  Filled 2015-08-28: qty 1000

## 2015-08-28 MED ORDER — HEPARIN SOD (PORK) LOCK FLUSH 100 UNIT/ML IV SOLN: 500.0000 [IU] | Freq: Once | INTRAVENOUS | Status: DC | PRN

## 2015-08-28 MED ORDER — FLUOROURACIL CHEMO INJECTION 2.5 GM/50ML
400.0000 mg/m2 | Freq: Once | INTRAVENOUS | Status: AC
  Administered 2015-08-28: 800 mg via INTRAVENOUS
  Filled 2015-08-28: qty 16

## 2015-08-28 MED ORDER — SODIUM CHLORIDE 0.9 % IJ SOLN
10.0000 mL | INTRAMUSCULAR | Status: DC | PRN
  Administered 2015-08-28: 10 mL via INTRAVENOUS
  Filled 2015-08-28: qty 10

## 2015-08-28 MED ORDER — POTASSIUM CHLORIDE CRYS ER 20 MEQ PO TBCR: 20.0000 meq | EXTENDED_RELEASE_TABLET | Freq: Every day | ORAL | Status: DC

## 2015-08-28 MED ORDER — SODIUM CHLORIDE 0.9 % IV SOLN
2400.0000 mg/m2 | INTRAVENOUS | Status: DC
  Administered 2015-08-28: 4650 mg via INTRAVENOUS
  Filled 2015-08-28: qty 93

## 2015-08-28 MED ORDER — SODIUM CHLORIDE 0.9 % IJ SOLN
10.0000 mL | INTRAMUSCULAR | Status: DC | PRN
  Filled 2015-08-28: qty 10

## 2015-08-28 MED ORDER — LEUCOVORIN CALCIUM INJECTION 350 MG
800.0000 mg | Freq: Once | INTRAVENOUS | Status: AC
  Administered 2015-08-28: 800 mg via INTRAVENOUS
  Filled 2015-08-28: qty 25

## 2015-08-28 MED ORDER — FENTANYL 50 MCG/HR TD PT72: 50.0000 ug | MEDICATED_PATCH | TRANSDERMAL | Status: DC

## 2015-08-28 MED ORDER — SODIUM CHLORIDE 0.9 % IV SOLN
Freq: Once | INTRAVENOUS | Status: AC
  Administered 2015-08-28: 11:00:00 via INTRAVENOUS
  Filled 2015-08-28: qty 8

## 2015-08-28 MED ORDER — HEPARIN SOD (PORK) LOCK FLUSH 100 UNIT/ML IV SOLN: 500.0000 [IU] | Freq: Once | INTRAVENOUS | Status: DC

## 2015-08-28 MED ORDER — ATROPINE SULFATE 0.4 MG/ML IJ SOLN: 0.4000 mg | Freq: Once | INTRAMUSCULAR | Status: DC | PRN

## 2015-08-28 MED ORDER — SODIUM CHLORIDE 0.9 % IV SOLN
20.0000 meq | Freq: Once | INTRAVENOUS | Status: AC
  Administered 2015-08-28: 20 meq via INTRAVENOUS
  Filled 2015-08-28: qty 10

## 2015-08-28 NOTE — Progress Notes (Signed)
Patient requesting refill for Fentanyl patches.  States he is passing multiple renal calculi since starting chemo.

## 2015-08-28 NOTE — Progress Notes (Signed)
[place] @ [facility] Telephone:(336) [phone]  Fax:(336) [place]  [name] OB: 1961/02/13  MR#: [id number]  EPP#:[id number]  Patient Care Team: [name], MD as PCP - General (Family Medicine) [name], MD as Consulting Physician (General Surgery)  CHIEF COMPLAINT:  Chief Complaint  Patient presents with  . Colon Cancer   1.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 Specimen integrity: Intact. Macroscopic tumor perforation: Not identified. Invasive tumor: Maximum size: 5.5 cm. Histologic type(s): Invasive adenocarcinoma. Histologic grade and differentiation: G1: well differentiated/low grade  Pathologic Staging: pT4b, pN1b, pM1b Ancillary studies MSI stable K-ras wild-type not mutated by Foundation study.    2.Patient was started on FOLFOX in September of 2016,after 2 cycles of chemotherapy patient developed neuropathy.  Patient had a previous neuropathy as a baseline which increased so was switched over to  FOLFIRI I  IN December of 2016 As patient has clear os wild-type we can proceed to add either a Avastin or cetuximab from the next chemotherapy.(November, 2016)   Avastin was added as abdominal wound has completely healed now  VISIT DIAGNOSIS:   No diagnosis found.    No history exists.    Oncology Flowsheet 07/04/2015 07/17/2015 07/17/2015 07/31/2015 07/31/2015 08/14/2015 08/14/2015  Day, Cycle   Day 1, Cycle 2   Day 1, Cycle 3   Day 1, Cycle 4    bevacizumab (AVASTIN) IV - - - - - 5 mg/kg    dexamethasone (DECADRON) IJ - - - - - - -  dexamethasone (DECADRON) IV   [ 20 mg ]   [ 20 mg ]   [ 20 mg ]    enoxaparin (LOVENOX) [place] - - - - - - -  ferumoxytol (FERAHEME) IV - - - - - - -  fluorouracil (ADRUCIL) IV 2,400 mg/m2 400 mg/m2 2,400 mg/m2 400 mg/m2 2,400 mg/m2 400 mg/m2 2,400 mg/m2  fosaprepitant (EMEND) IV - - - - - - -  irinotecan (CAMPTOSAR)  IV   150 mg/m2   150 mg/m2   150 mg/m2    leucovorin IV   800 mg   800 mg   800 mg    ondansetron (ZOFRAN) IJ - - - - - - -  ondansetron (ZOFRAN) IV   [ 16 mg ]   [ 16 mg ]   [ 16 mg ]    oxaliplatin (ELOXATIN) IV - - - - - - -  palonosetron (ALOXI) IV - - - - - - -    INTERVAL HISTORY: 54 year old gentleman is somewhat depressed.  Started having increasing pain in the left lower abdominal area since March of 2016.  Patient was admitted in the hospital CT scan revealed possibility of diverticulitis versus obstructing lesion patient was treated with antibiotics. Patient continues to have abdominal pain.  Drainage from abdominal wound.  Patient is taking frequent Vicodin.  Using fentanyl patch 12 g.  No nausea no   Abdominal pain has stabilized with present pain management.  No diarrhea.  No tingling numbness.  Neuropathy also has improved. Patient cannot get a Avastin today because of high blood pressure.  Patient was taking antihypertensive medication but has stopped it a while ago. Here for further follow-up and treatment consideration Patient has some numbness over the face.  Lower extremity numbness is stable. No nausea no vomiting no diarrhea  Patient continues to a lot of abdominal pain secondary to passing multiple stones  CT  scan had revealed partially obstructing right kidney stone  Patient is using 7 tablets of oxycodone per day because of increased frequency of passing stones  REVIEW OF SYSTEMS:   Review of system: GENERAL:Patient is feeling somewhat depressed.  PERFORMANCE STATUS (ECOG):  01 HEENT:  No visual changes, runny nose, sore throat, mouth sores or tenderness. Lungs: No shortness of breath or cough.  No hemoptysis. Cardiac:  No chest pain, palpitations, orthopnea, or PND. GI:  No nausea, vomiting, diarrhea, constipation, melena or hematochezia.   small area in abdominal wound is still healing  Has colostomy.  Difficulty dealing with colostomy.  Persistent abdominal  pain requiring hydrocodone to control pain Abdominal wound is healing.  Per tenderness present.  Some drainage which is significant.  Tenderness around that area. Musculoskeletal:  No back pain.  No joint pain.  No muscle tenderness. GU: No hematuria. Extremities:  No pain or swelling. Skin:  No rashes or skin changes. Neuro:  Numbness in upper and lower extremity as well as on the face has been stabilized. Endocrine:  No diabetes, thyroid issues, hot flashes or night sweats. Psych:  No mood changes, depression or anxiety. Pain:  No focal pain.  Patient had only one episode of diarrhea No significant abdominal crampy pain Patient has a history of sarcoidosis diagnosis in 2014 Was given Lyrica for neuropathy.  But patient has discontinued that because of progressive joint pains Review of systems:  All other systems reviewed and found to be negative.   As per HPI. Otherwise, a complete review of systems is negatve.  PAST MEDICAL HISTORY: Past Medical History  Diagnosis Date  . Sarcoid ([place]) 10/2012  . Neuropathy ([place])   . 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 Besnier-Boeck disease ([place]) 12/21/2012  . Extreme obesity ([place]) 12/21/2012  . LBP (low back pain) 05/28/2014  . Adiposity 03/21/2015  . PONV (postoperative nausea and vomiting)   . Iron deficiency anemia due to chronic blood loss 05/08/2015  . Hypertension     has been off BP med x 6 months  . Arthritis   . GERD (gastroesophageal reflux disease)   . Abdominal pain     mid abdomen  . History of kidney stones   . History of transfusion   . Colon cancer ([place])   . Skin cancer     "it flairs up with sarcoidosis"    PAST SURGICAL HISTORY: Past Surgical History  Procedure Laterality Date  . Cardiac catheterization    . Flexible sigmoidoscopy N/A 05/07/2015    Procedure: FLEXIBLE SIGMOIDOSCOPY;  Surgeon: Laurence Spates, MD;  Location: [facility];  Service: Endoscopy;  Laterality: N/A;  . Laparoscopic partial  colectomy N/A 05/07/2015    Procedure: LAPAROSCOPIC ASSITED PARTIAL COLECTOMY WITH COLOSTOMY, SMALL BOWEL RESECTION, EXCISION OF PERITONEAL NODULE;  Surgeon: [name], MD;  Location: [facility] OR[facility];  Service: General;  Laterality: N/A;  . Portacath placement Right 05/27/2015    Procedure: INSERTION PORT-A-CATH;  Surgeon: [name], MD;  Location: [facility];  Service: General;  Laterality: Right;    FAMILY HISTORY Family History  Problem Relation Age of Onset  . Arthritis Mother   . Heart disease Mother   . Stroke Father         ADVANCED DIRECTIVES:  Patient does not have any living will or healthcare power of attorney.  Information was given .  Available resources had been discussed.  We will follow-up on subsequent appointments regarding this issue  HEALTH MAINTENANCE: Social History  Substance Use  Topics  . Smoking status: Passive Smoke Exposure - Never Smoker  . Smokeless tobacco: Never Used  . Alcohol Use: No      Allergies  Allergen Reactions  . Fentanyl Nausea And Vomiting    IV med  . Versed [Midazolam] Nausea And Vomiting  . Gabapentin     Swallowing problems  . Paba Derivatives Nausea And Vomiting    Pt states he is allergic to unknown anesthesia. Pt has nausea and vomiting with anesthesia.     Current Outpatient Prescriptions  Medication Sig Dispense Refill  . AMBIEN 10 MG tablet Take 10 mg by mouth at bedtime as needed for sleep.   4  . fentaNYL (DURAGESIC - DOSED MCG/HR) 50 MCG/HR Place 1 patch (50 mcg total) onto the skin every 3 (three) days. 10 patch 0  . lidocaine-prilocaine (EMLA) cream Apply 1 application topically as needed. 30 g 3  . lisinopril (PRINIVIL,ZESTRIL) 10 MG tablet Take 1 tablet (10 mg total) by mouth daily.    . ondansetron (ZOFRAN) 4 MG tablet Take 1 tablet (4 mg total) by mouth every 6 (six) hours as needed for nausea. 60 tablet 3  . oxyCODONE (OXY IR/ROXICODONE) 5 MG immediate release tablet Take 1 tablet (5 mg total) by mouth every 4  (four) hours as needed for severe pain. 50 tablet 0   No current facility-administered medications for this visit.   Facility-Administered Medications Ordered in Other Visits  Medication Dose Route Frequency Provider Last Rate Last Dose  . heparin lock flush 100 unit/mL  500 Units Intravenous Once Forest Gleason, MD      . sodium chloride 0.9 % injection 10 mL  10 mL Intracatheter PRN Forest Gleason, MD   10 mL at 08/02/15 0955  . sodium chloride 0.9 % injection 10 mL  10 mL Intravenous PRN Forest Gleason, MD   10 mL at 08/28/15 0856    OBJECTIVE: PHYSICAL EXAM: GENERAL:  Well developed, well nourished, Patient is in somewhat pain and in distress. MENTAL STATUS:  Alert and oriented to person, place and time.   RESPIRATORY:  Clear to auscultation without rales, wheezes or rhonchi. CARDIOVASCULAR:  Regular rate and rhythm without murmur, rub or gallop.  ABDOMEN:  Soft, non-tender, with active bowel sounds, and no hepatosplenomegaly.  No masses. Wound is still draining. Patient is putting Neosporin Colostomy functioning well BACK:  No CVA tenderness.  No tenderness on percussion of the back or rib cage. SKIN:  No rashes, ulcers or lesions. EXTREMITIES: No edema, no skin discoloration or tenderness.  No palpable cords. LYMPH NODES: No palpable cervical, supraclavicular, axillary or inguinal adenopathy  NEUROLOGICAL: Unremarkable. PSYCH:  depression  Filed Vitals:   08/28/15 0916  BP: 160/99  Pulse: 95  Temp: 95 F (35 C)     Body mass index is 39.71 kg/(m^2).    ECOG FS:1 - Symptomatic but completely ambulatory  LAB RESULTS:  Infusion on 08/28/2015  Component Date Value Ref Range Status  . WBC 08/28/2015 3.9  3.8 - 10.6 K/uL Final   A-LINE DRAW  . RBC 08/28/2015 4.16* 4.40 - 5.90 MIL/uL Final  . Hemoglobin 08/28/2015 10.7* 13.0 - 18.0 g/dL Final  . HCT 08/28/2015 33.1* 40.0 - 52.0 % Final  . MCV 08/28/2015 79.6* 80.0 - 100.0 fL Final  . MCH 08/28/2015 25.7* 26.0 - 34.0 pg Final    . MCHC 08/28/2015 32.3  32.0 - 36.0 g/dL Final  . RDW 08/28/2015 18.6* 11.5 - 14.5 % Final  . Platelets 08/28/2015 240  150 - 440 K/uL Final  . Neutrophils Relative % 08/28/2015 53   Final  . Neutro Abs 08/28/2015 2.1  1.4 - 6.5 K/uL Final  . Lymphocytes Relative 08/28/2015 32   Final  . Lymphs Abs 08/28/2015 1.2  1.0 - 3.6 K/uL Final  . Monocytes Relative 08/28/2015 11   Final  . Monocytes Absolute 08/28/2015 0.4  0.2 - 1.0 K/uL Final  . Eosinophils Relative 08/28/2015 3   Final  . Eosinophils Absolute 08/28/2015 0.1  0 - 0.7 K/uL Final  . Basophils Relative 08/28/2015 1   Final  . Basophils Absolute 08/28/2015 0.0  0 - 0.1 K/uL Final  . Sodium 08/28/2015 138  135 - 145 mmol/L Final  . Potassium 08/28/2015 2.9* 3.5 - 5.1 mmol/L Final   Comment: RESULTS VERIFIED BY REPEAT TESTING CRITICAL RESULT CALLED TO, READ BACK BY AND VERIFIED WITH HAYLEY RHODE AT 4268 08/28/2015 KMR   . Chloride 08/28/2015 103  101 - 111 mmol/L Final  . CO2 08/28/2015 25  22 - 32 mmol/L Final  . Glucose, Bld 08/28/2015 123* 65 - 99 mg/dL Final  . BUN 08/28/2015 9  6 - 20 mg/dL Final  . Creatinine, Ser 08/28/2015 0.90  0.61 - 1.24 mg/dL Final  . Calcium 08/28/2015 8.5* 8.9 - 10.3 mg/dL Final  . Total Protein 08/28/2015 6.8  6.5 - 8.1 g/dL Final  . Albumin 08/28/2015 3.5  3.5 - 5.0 g/dL Final  . AST 08/28/2015 23  15 - 41 U/L Final  . ALT 08/28/2015 22  17 - 63 U/L Final  . Alkaline Phosphatase 08/28/2015 81  38 - 126 U/L Final  . Total Bilirubin 08/28/2015 0.3  0.3 - 1.2 mg/dL Final  . GFR calc non Af Amer 08/28/2015 >60  >60 mL/min Final  . GFR calc Af Amer 08/28/2015 >60  >60 mL/min Final   Comment: (NOTE) The eGFR has been calculated using the CKD EPI equation. This calculation has not been validated in all clinical situations. eGFR's persistently <60 mL/min signify possible Chronic Kidney Disease.   . Anion gap 08/28/2015 10  5 - 15 Final  . Magnesium 08/28/2015 2.0  1.7 - 2.4 mg/dL Final      ASSESSMENT:  Carcinoma of distal descending colon T4 b(small bowel involvement) N1 B (2 of 15 lymph nodes positive)   M1 B2 tumor(  Peritoneal nodules) 2.sarcoidosis with involvement of mediastinal lymph node and axillary lymph node by history. 3. No significant family history of any colon cancer or any other malignancy 4. Preop CEA was less than 1 5.Preop CT scan has been reviewed does not show any evidence of liver metastases 6. Patient did not have colonoscopy 7. MSI stable 8.  K-ras not mutated by [facility]   One study   PLAN:    continue chemotherapy with   FOLFIRI   hOLD A aVASTIN BECAUSE OF DIASTOLIC BLOOD PRESSURE IS 99   Hypokalemia  Patient was advised to take potassium 20 mg by mouth daily an IV potassium was given   Patient was also referred to urologist for management of kidney stones   CT scan has been reviewed independently did not show any evidence of recurrent or progressive disease.   There was partial right hydronephrosis.  Patient probably passed that stone recently   Patient expressed understanding and was in agreement with this plan. He also understands that He can call clinic at any time with any questions, concerns, or complaints.    No matching staging information was found for  the patient.  Forest Gleason, MD   08/28/2015 9:38 AM

## 2015-08-29 LAB — CEA: CEA: 1.3 ng/mL (ref 0.0–4.7)

## 2015-08-30 ENCOUNTER — Inpatient Hospital Stay: Payer: BLUE CROSS/BLUE SHIELD

## 2015-08-30 VITALS — BP 137/93 | HR 91 | Temp 97.6°F | Resp 18

## 2015-08-30 DIAGNOSIS — C186 Malignant neoplasm of descending colon: Secondary | ICD-10-CM | POA: Diagnosis not present

## 2015-08-30 MED ORDER — SODIUM CHLORIDE 0.9 % IJ SOLN
10.0000 mL | INTRAMUSCULAR | Status: DC | PRN
  Administered 2015-08-30: 10 mL
  Filled 2015-08-30: qty 10

## 2015-08-30 MED ORDER — HEPARIN SOD (PORK) LOCK FLUSH 100 UNIT/ML IV SOLN
500.0000 [IU] | Freq: Once | INTRAVENOUS | Status: AC | PRN
  Administered 2015-08-30: 500 [IU]

## 2015-09-01 ENCOUNTER — Telehealth: Payer: Self-pay | Admitting: *Deleted

## 2015-09-01 MED ORDER — PROMETHAZINE HCL 25 MG PO TABS
25.0000 mg | ORAL_TABLET | Freq: Four times a day (QID) | ORAL | Status: DC | PRN
Start: 2015-09-01 — End: 2015-09-18

## 2015-09-01 NOTE — Telephone Encounter (Signed)
Nauseated since chemo and his med is not working. He is not vomiting, but is very nauseated.  Per Dr Oliva Bustard, promethazine 25 mg e scribed to [facility] and Roxanne notified

## 2015-09-02 ENCOUNTER — Ambulatory Visit: Payer: BLUE CROSS/BLUE SHIELD | Admitting: Urology

## 2015-09-04 ENCOUNTER — Other Ambulatory Visit: Payer: Self-pay | Admitting: *Deleted

## 2015-09-04 DIAGNOSIS — C186 Malignant neoplasm of descending colon: Secondary | ICD-10-CM

## 2015-09-04 MED ORDER — OXYCODONE HCL 5 MG PO TABS: 5.0000 mg | ORAL_TABLET | ORAL | Status: DC | PRN

## 2015-09-16 ENCOUNTER — Ambulatory Visit: Payer: BLUE CROSS/BLUE SHIELD | Admitting: Urology

## 2015-09-18 ENCOUNTER — Inpatient Hospital Stay (HOSPITAL_BASED_OUTPATIENT_CLINIC_OR_DEPARTMENT_OTHER): Payer: BLUE CROSS/BLUE SHIELD | Admitting: Oncology

## 2015-09-18 ENCOUNTER — Inpatient Hospital Stay: Payer: BLUE CROSS/BLUE SHIELD

## 2015-09-18 ENCOUNTER — Ambulatory Visit
Admission: RE | Admit: 2015-09-18 | Discharge: 2015-09-18 | Disposition: A | Discharge status: Home / Self Care | Payer: BLUE CROSS/BLUE SHIELD | Source: Ambulatory Visit | Attending: Oncology | Admitting: Oncology

## 2015-09-18 ENCOUNTER — Inpatient Hospital Stay: Payer: BLUE CROSS/BLUE SHIELD | Attending: Oncology

## 2015-09-18 ENCOUNTER — Encounter: Payer: Self-pay | Admitting: Oncology

## 2015-09-18 VITALS — BP 152/126 | HR 109 | Temp 97.6°F | Wt 282.9 lb

## 2015-09-18 DIAGNOSIS — N1330 Unspecified hydronephrosis: Secondary | ICD-10-CM

## 2015-09-18 DIAGNOSIS — C186 Malignant neoplasm of descending colon: Secondary | ICD-10-CM

## 2015-09-18 DIAGNOSIS — N200 Calculus of kidney: Secondary | ICD-10-CM | POA: Diagnosis not present

## 2015-09-18 DIAGNOSIS — E860 Dehydration: Secondary | ICD-10-CM | POA: Diagnosis not present

## 2015-09-18 DIAGNOSIS — K219 Gastro-esophageal reflux disease without esophagitis: Secondary | ICD-10-CM | POA: Diagnosis not present

## 2015-09-18 DIAGNOSIS — E876 Hypokalemia: Secondary | ICD-10-CM | POA: Diagnosis not present

## 2015-09-18 DIAGNOSIS — Z933 Colostomy status: Secondary | ICD-10-CM | POA: Insufficient documentation

## 2015-09-18 DIAGNOSIS — D869 Sarcoidosis, unspecified: Secondary | ICD-10-CM | POA: Diagnosis not present

## 2015-09-18 DIAGNOSIS — M1990 Unspecified osteoarthritis, unspecified site: Secondary | ICD-10-CM | POA: Insufficient documentation

## 2015-09-18 DIAGNOSIS — Z5111 Encounter for antineoplastic chemotherapy: Secondary | ICD-10-CM | POA: Diagnosis not present

## 2015-09-18 DIAGNOSIS — Z9049 Acquired absence of other specified parts of digestive tract: Secondary | ICD-10-CM | POA: Insufficient documentation

## 2015-09-18 DIAGNOSIS — E669 Obesity, unspecified: Secondary | ICD-10-CM | POA: Insufficient documentation

## 2015-09-18 DIAGNOSIS — I10 Essential (primary) hypertension: Secondary | ICD-10-CM | POA: Insufficient documentation

## 2015-09-18 DIAGNOSIS — R112 Nausea with vomiting, unspecified: Secondary | ICD-10-CM | POA: Diagnosis not present

## 2015-09-18 DIAGNOSIS — Z79899 Other long term (current) drug therapy: Secondary | ICD-10-CM | POA: Insufficient documentation

## 2015-09-18 LAB — CBC WITH DIFFERENTIAL/PLATELET
Basophils Absolute: 0.1 10*3/uL (ref 0–0.1)
Basophils Relative: 1 %
Eosinophils Absolute: 0.1 10*3/uL (ref 0–0.7)
Eosinophils Relative: 3 %
HEMATOCRIT: 37.5 % — AB (ref 40.0–52.0)
Hemoglobin: 12.2 g/dL — ABNORMAL LOW (ref 13.0–18.0)
LYMPHS PCT: 31 %
Lymphs Abs: 1.4 10*3/uL (ref 1.0–3.6)
MCH: 26.9 pg (ref 26.0–34.0)
MCHC: 32.4 g/dL (ref 32.0–36.0)
MCV: 83 fL (ref 80.0–100.0)
MONO ABS: 0.6 10*3/uL (ref 0.2–1.0)
MONOS PCT: 14 %
NEUTROS ABS: 2.3 10*3/uL (ref 1.4–6.5)
Neutrophils Relative %: 51 %
Platelets: 284 10*3/uL (ref 150–440)
RBC: 4.52 MIL/uL (ref 4.40–5.90)
RDW: 21.4 % — AB (ref 11.5–14.5)
WBC: 4.4 10*3/uL (ref 3.8–10.6)

## 2015-09-18 LAB — COMPREHENSIVE METABOLIC PANEL
ALT: 29 U/L (ref 17–63)
ANION GAP: 9 (ref 5–15)
AST: 24 U/L (ref 15–41)
Albumin: 4 g/dL (ref 3.5–5.0)
Alkaline Phosphatase: 89 U/L (ref 38–126)
BILIRUBIN TOTAL: 0.4 mg/dL (ref 0.3–1.2)
BUN: 9 mg/dL (ref 6–20)
CO2: 25 mmol/L (ref 22–32)
Calcium: 9.1 mg/dL (ref 8.9–10.3)
Chloride: 105 mmol/L (ref 101–111)
Creatinine, Ser: 1.01 mg/dL (ref 0.61–1.24)
GFR calc Af Amer: >60 mL/min (ref >60)
Glucose, Bld: 109 mg/dL — ABNORMAL HIGH (ref 65–99)
POTASSIUM: 3.3 mmol/L — AB (ref 3.5–5.1)
Sodium: 139 mmol/L (ref 135–145)
TOTAL PROTEIN: 7.3 g/dL (ref 6.5–8.1)

## 2015-09-18 LAB — MAGNESIUM: MAGNESIUM: 2.1 mg/dL (ref 1.7–2.4)

## 2015-09-18 MED ORDER — SODIUM CHLORIDE 0.9 % IV SOLN
Freq: Once | INTRAVENOUS | Status: AC
  Administered 2015-09-18: 12:00:00 via INTRAVENOUS
  Filled 2015-09-18: qty 4

## 2015-09-18 MED ORDER — SODIUM CHLORIDE 0.9 % IV SOLN
Freq: Once | INTRAVENOUS | Status: AC
  Administered 2015-09-18: 12:00:00 via INTRAVENOUS
  Filled 2015-09-18: qty 1000

## 2015-09-18 MED ORDER — SODIUM CHLORIDE 0.9 % IJ SOLN
10.0000 mL | INTRAMUSCULAR | Status: DC | PRN
Start: 2015-09-18 — End: 2015-09-18
  Administered 2015-09-18: 10 mL via INTRAVENOUS
  Filled 2015-09-18: qty 10

## 2015-09-18 MED ORDER — SODIUM CHLORIDE 0.9 % IV SOLN
INTRAVENOUS | Status: DC
  Administered 2015-09-18: 12:00:00 via INTRAVENOUS
  Filled 2015-09-18: qty 1000

## 2015-09-18 MED ORDER — FENTANYL 50 MCG/HR TD PT72: 50.0000 ug | MEDICATED_PATCH | TRANSDERMAL | Status: DC

## 2015-09-18 MED ORDER — PROMETHAZINE HCL 25 MG PO TABS: 25.0000 mg | ORAL_TABLET | Freq: Four times a day (QID) | ORAL | Status: DC | PRN

## 2015-09-18 MED ORDER — OXYCODONE HCL 5 MG PO TABS: 5.0000 mg | ORAL_TABLET | ORAL | Status: DC | PRN

## 2015-09-18 MED ORDER — HEPARIN SOD (PORK) LOCK FLUSH 100 UNIT/ML IV SOLN
500.0000 [IU] | Freq: Once | INTRAVENOUS | Status: AC
  Administered 2015-09-18: 500 [IU] via INTRAVENOUS

## 2015-09-18 MED ORDER — HEPARIN SOD (PORK) LOCK FLUSH 100 UNIT/ML IV SOLN
INTRAVENOUS | Status: AC
  Filled 2015-09-18: qty 5

## 2015-09-18 NOTE — Progress Notes (Signed)
Requesting refill for fentanyl, oxycodone and phenergan.

## 2015-09-19 LAB — CEA: CEA: 1 ng/mL (ref 0.0–4.7)

## 2015-09-20 ENCOUNTER — Encounter: Payer: Self-pay | Admitting: Oncology

## 2015-09-20 NOTE — Progress Notes (Signed)
[place] @ [facility] [phone]  [phone]) [place]  [name] OB: 06-24-1961  MR#: [id number]  EXB#:[id number]  Patient Care Team: [name], MD as PCP - General (Family Medicine) [name], MD as Consulting Physician (General Surgery)  CHIEF COMPLAINT:  Chief Complaint  Patient presents with  . Colon Cancer   1.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 Specimen integrity: Intact. Macroscopic tumor perforation: Not identified. Invasive tumor: Maximum size: 5.5 cm. Histologic type(s): Invasive adenocarcinoma. Histologic grade and differentiation: G1: well differentiated/low grade  Pathologic Staging: pT4b, pN1b, pM1b Ancillary studies MSI stable K-ras wild-type not mutated by Foundation study.    2.Patient was started on FOLFOX in September of 2016,after 2 cycles of chemotherapy patient developed neuropathy.  Patient had a previous neuropathy as a baseline which increased so was switched over to  FOLFIRI I  IN December of 2016 As patient has clear os wild-type we can proceed to add either a Avastin or cetuximab from the next chemotherapy.(November, 2016)   Avastin was added as abdominal wound has completely healed now  VISIT DIAGNOSIS:     ICD-9-CM ICD-10-CM   1. 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 Kidney stones 592.0 N20.0   3. Hydronephrosis, unspecified hydronephrosis type 591 N13.30       No history exists.      INTERVAL HISTORY: 55 year old gentleman is somewhat depressed.  Started having increasing pain in the left lower abdominal area since  March of 2016.  Patient was admitted in the hospital CT scan revealed possibility of diverticulitis versus obstructing lesion patient was treated with antibiotics. Patient came today further follow-up and continuation of treatment however patient has persistent nausea vomiting.  Increasing abdominal discomfort.  Appetite is poor. Blood pressure was noticed to be high  REVIEW OF SYSTEMS:   Review of system: GENERAL:Patient is feeling somewhat depressed.  PERFORMANCE STATUS (ECOG):  01 HEENT:  No visual changes, runny nose, sore throat, mouth sores or tenderness. Lungs: No shortness of breath or cough.  No hemoptysis. Cardiac:  No chest pain, palpitations, orthopnea, or PND. GI:  Patient has persistent nausea.  Abdominal discomfort.  Poor oral intake.   Musculoskeletal:  No back pain.  No joint pain.  No muscle tenderness. GU: No hematuria. Extremities:  No pain or swelling. Skin:  No rashes or skin changes. Neuro:  Numbness in upper and lower extremity as well as on the face has been stabilized. Endocrine:  No diabetes, thyroid issues, hot flashes or night sweats. Psych:  No mood changes, depression or anxiety. Pain:  No focal pain.  Patient had only one episode of diarrhea No significant abdominal crampy pain Patient has a history of sarcoidosis diagnosis in 2014 Was given Lyrica for neuropathy.  But patient has discontinued that because of progressive joint pains Review of systems:  All other systems reviewed and found to be negative.   As per HPI. Otherwise, a complete review of systems is negatve.  PAST MEDICAL HISTORY: Past Medical History  Diagnosis Date  . Sarcoid ([place]) 10/2012  . Neuropathy ([place])   .  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 Besnier-Boeck disease ([place]) 12/21/2012  . Extreme obesity ([place]) 12/21/2012  . LBP (low back pain) 05/28/2014  . Adiposity 03/21/2015  . PONV (postoperative nausea and vomiting)   . Iron deficiency anemia due to  chronic blood loss 05/08/2015  . Hypertension     has been off BP med x 6 months  . Arthritis   . GERD (gastroesophageal reflux disease)   . Abdominal pain     mid abdomen  . History of kidney stones   . History of transfusion   . Colon cancer ([place])   . Skin cancer     "it flairs up with sarcoidosis"    PAST SURGICAL HISTORY: Past Surgical History  Procedure Laterality Date  . Cardiac catheterization    . Flexible sigmoidoscopy N/A 05/07/2015    Procedure: FLEXIBLE SIGMOIDOSCOPY;  Surgeon: Laurence Spates, MD;  Location: [facility];  Service: Endoscopy;  Laterality: N/A;  . Laparoscopic partial colectomy N/A 05/07/2015    Procedure: LAPAROSCOPIC ASSITED PARTIAL COLECTOMY WITH COLOSTOMY, SMALL BOWEL RESECTION, EXCISION OF PERITONEAL NODULE;  Surgeon: [name], MD;  Location: [facility];  Service: General;  Laterality: N/A;  . Portacath placement Right 05/27/2015    Procedure: INSERTION PORT-A-CATH;  Surgeon: [name], MD;  Location: [facility];  Service: General;  Laterality: Right;    FAMILY HISTORY Family History  Problem Relation Age of Onset  . Arthritis Mother   . Heart disease Mother   . Stroke Father         ADVANCED DIRECTIVES:  Patient does not have any living will or healthcare power of attorney.  Information was given .  Available resources had been discussed.  We will follow-up on subsequent appointments regarding this issue  HEALTH MAINTENANCE: Social History  Substance Use Topics  . Smoking status: Passive Smoke Exposure - Never Smoker  . Smokeless tobacco: Never Used  . Alcohol Use: No      Allergies  Allergen Reactions  . Fentanyl Nausea And Vomiting    IV med  . Versed [Midazolam] Nausea And Vomiting  . Gabapentin     Swallowing problems  . Paba Derivatives Nausea And Vomiting    Pt states he is allergic to unknown anesthesia. Pt has nausea and vomiting with anesthesia.     Current Outpatient Prescriptions  Medication Sig Dispense Refill   . AMBIEN 10 MG tablet Take 10 mg by mouth at bedtime as needed for sleep.   4  . fentaNYL (DURAGESIC - DOSED MCG/HR) 50 MCG/HR Place 1 patch (50 mcg total) onto the skin every 3 (three) days. 10 patch 0  . lidocaine-prilocaine (EMLA) cream Apply 1 application topically as needed. 30 g 3  . lisinopril (PRINIVIL,ZESTRIL) 10 MG tablet Take 1 tablet (10 mg total) by mouth daily.    . ondansetron (ZOFRAN) 4 MG tablet Take 1 tablet (4 mg total) by mouth every 6 (six) hours as needed for nausea. 60 tablet 3  . oxyCODONE (OXY IR/ROXICODONE) 5 MG immediate release tablet Take 1 tablet (5 mg total) by mouth every 4 (four) hours as needed for severe pain. 50 tablet 0  . potassium chloride SA (K-DUR,KLOR-CON) 20 MEQ tablet Take 1 tablet (20 mEq total) by mouth daily. 30 tablet 0  . promethazine (PHENERGAN) 25 MG tablet Take 1 tablet (25 mg total) by mouth every 6 (six) hours as needed for nausea or vomiting. 60 tablet 0   No current facility-administered medications for this visit.   Facility-Administered  Medications Ordered in Other Visits  Medication Dose Route Frequency Provider Last Rate Last Dose  . sodium chloride 0.9 % injection 10 mL  10 mL Intracatheter PRN Forest Gleason, MD   10 mL at 08/02/15 0955    OBJECTIVE: PHYSICAL EXAM: GENERAL:  Well developed, well nourished, Patient is in somewhat pain and in distress. MENTAL STATUS:  Alert and oriented to person, place and time.   RESPIRATORY:  Clear to auscultation without rales, wheezes or rhonchi. CARDIOVASCULAR:  Regular rate and rhythm without murmur, rub or gallop.  ABDOMEN:  Soft, non-tender, with active bowel sounds, and no hepatosplenomegaly.  No masses. Wound is still draining. Patient is putting Neosporin Colostomy functioning well BACK:  No CVA tenderness.  No tenderness on percussion of the back or rib cage. SKIN:  No rashes, ulcers or lesions. EXTREMITIES: No edema, no skin discoloration or tenderness.  No palpable cords. LYMPH  NODES: No palpable cervical, supraclavicular, axillary or inguinal adenopathy  NEUROLOGICAL: Unremarkable. PSYCH:  depression  Filed Vitals:   09/18/15 1050  BP: 152/126  Pulse: 109  Temp: 97.6 F (36.4 C)     Body mass index is 39.47 kg/(m^2).    ECOG FS:1 - Symptomatic but completely ambulatory  LAB RESULTS:  Infusion on 09/18/2015  Component Date Value Ref Range Status  . CEA 09/18/2015 1.0  0.0 - 4.7 ng/mL Final   Comment: (NOTE)       Roche ECLIA methodology       Nonsmokers  <3.9                                     Smokers     <5.6 Performed At: [facility] [place], [place] [id number] Lindon Romp MD SF:[id number]   Clinical Support on 09/18/2015  Component Date Value Ref Range Status  . WBC 09/18/2015 4.4  3.8 - 10.6 K/uL Final  . RBC 09/18/2015 4.52  4.40 - 5.90 MIL/uL Final  . Hemoglobin 09/18/2015 12.2* 13.0 - 18.0 g/dL Final  . HCT 09/18/2015 37.5* 40.0 - 52.0 % Final  . MCV 09/18/2015 83.0  80.0 - 100.0 fL Final  . MCH 09/18/2015 26.9  26.0 - 34.0 pg Final  . MCHC 09/18/2015 32.4  32.0 - 36.0 g/dL Final  . RDW 09/18/2015 21.4* 11.5 - 14.5 % Final  . Platelets 09/18/2015 284  150 - 440 K/uL Final  . Neutrophils Relative % 09/18/2015 51   Final  . Neutro Abs 09/18/2015 2.3  1.4 - 6.5 K/uL Final  . Lymphocytes Relative 09/18/2015 31   Final  . Lymphs Abs 09/18/2015 1.4  1.0 - 3.6 K/uL Final  . Monocytes Relative 09/18/2015 14   Final  . Monocytes Absolute 09/18/2015 0.6  0.2 - 1.0 K/uL Final  . Eosinophils Relative 09/18/2015 3   Final  . Eosinophils Absolute 09/18/2015 0.1  0 - 0.7 K/uL Final  . Basophils Relative 09/18/2015 1   Final  . Basophils Absolute 09/18/2015 0.1  0 - 0.1 K/uL Final  . Sodium 09/18/2015 139  135 - 145 mmol/L Final  . Potassium 09/18/2015 3.3* 3.5 - 5.1 mmol/L Final  . Chloride 09/18/2015 105  101 - 111 mmol/L Final  . CO2 09/18/2015 25  22 - 32 mmol/L Final  . Glucose, Bld 09/18/2015 109* 65 - 99 mg/dL  Final  . BUN 09/18/2015 9  6 - 20 mg/dL Final  . Creatinine, Ser  09/18/2015 1.01  0.61 - 1.24 mg/dL Final  . Calcium 09/18/2015 9.1  8.9 - 10.3 mg/dL Final  . Total Protein 09/18/2015 7.3  6.5 - 8.1 g/dL Final  . Albumin 09/18/2015 4.0  3.5 - 5.0 g/dL Final  . AST 09/18/2015 24  15 - 41 U/L Final  . ALT 09/18/2015 29  17 - 63 U/L Final  . Alkaline Phosphatase 09/18/2015 89  38 - 126 U/L Final  . Total Bilirubin 09/18/2015 0.4  0.3 - 1.2 mg/dL Final  . GFR calc non Af Amer 09/18/2015 >60  >60 mL/min Final  . GFR calc Af Amer 09/18/2015 >60  >60 mL/min Final   Comment: (NOTE) The eGFR has been calculated using the CKD EPI equation. This calculation has not been validated in all clinical situations. eGFR's persistently <60 mL/min signify possible Chronic Kidney Disease.   . Anion gap 09/18/2015 9  5 - 15 Final  . Magnesium 09/18/2015 2.1  1.7 - 2.4 mg/dL Final     ASSESSMENT:  Carcinoma of distal descending colon T4 b(small bowel involvement) N1 B (2 of 15 lymph nodes positive)   M1 B2 tumor(  Peritoneal nodules) 2.sarcoidosis with involvement of mediastinal lymph node and axillary lymph node by history. 3.  Three-way abdomen was ordered because of persistent nausea.  Which did not reveal any evidence of small bowel obstruction This has been reviewed independently  4.  Hypertension.  Diastolic blood pressure was high patient was advised to increase lisinopril.  Patient has been off antihypertensive medication 5.  Hold chemotherapy today pending reevaluation  6.  CEA stable 7.  Hypokalemia and clinical sign of dehydration patient will get IV fluid and IV potassium 8.  K-ras not mutated by Foundation   One study Evaluate patient after all this information is available. If needed CT scan of abdomen would be done    PLAN:    continue chemotherapy with   FOLFIRI   hOLD A aVASTIN BECAUSE OF DIASTOLIC BLOOD PRESSURE IS 99   Hypokalemia  Patient was advised to take potassium 20  mg by mouth daily an IV potassium was given   Patient was also referred to urologist for management of kidney stones   CT scan has been reviewed independently did not show any evidence of recurrent or progressive disease.   There was partial right hydronephrosis.  Patient probably passed that stone recently   Patient expressed understanding and was in agreement with this plan. He also understands that He can call clinic at any time with any questions, concerns, or complaints.    No matching staging information was found for the patient.  Forest Gleason, MD   09/20/2015 3:56 PM

## 2015-09-25 ENCOUNTER — Inpatient Hospital Stay: Payer: BLUE CROSS/BLUE SHIELD

## 2015-09-25 ENCOUNTER — Inpatient Hospital Stay (HOSPITAL_BASED_OUTPATIENT_CLINIC_OR_DEPARTMENT_OTHER): Payer: BLUE CROSS/BLUE SHIELD | Admitting: Oncology

## 2015-09-25 ENCOUNTER — Encounter: Payer: Self-pay | Admitting: Oncology

## 2015-09-25 VITALS — BP 145/98 | HR 98 | Temp 96.7°F | Resp 18 | Wt 284.6 lb

## 2015-09-25 DIAGNOSIS — C186 Malignant neoplasm of descending colon: Secondary | ICD-10-CM

## 2015-09-25 DIAGNOSIS — D869 Sarcoidosis, unspecified: Secondary | ICD-10-CM | POA: Diagnosis not present

## 2015-09-25 DIAGNOSIS — I10 Essential (primary) hypertension: Secondary | ICD-10-CM | POA: Diagnosis not present

## 2015-09-25 DIAGNOSIS — Z79899 Other long term (current) drug therapy: Secondary | ICD-10-CM

## 2015-09-25 DIAGNOSIS — R112 Nausea with vomiting, unspecified: Secondary | ICD-10-CM

## 2015-09-25 DIAGNOSIS — E876 Hypokalemia: Secondary | ICD-10-CM

## 2015-09-25 DIAGNOSIS — Z933 Colostomy status: Secondary | ICD-10-CM

## 2015-09-25 LAB — CBC WITH DIFFERENTIAL/PLATELET
Basophils Absolute: 0.1 10*3/uL (ref 0–0.1)
Basophils Relative: 1 %
Eosinophils Absolute: 0.1 10*3/uL (ref 0–0.7)
Eosinophils Relative: 2 %
HEMATOCRIT: 38.7 % — AB (ref 40.0–52.0)
HEMOGLOBIN: 12.4 g/dL — AB (ref 13.0–18.0)
LYMPHS ABS: 1.7 10*3/uL (ref 1.0–3.6)
LYMPHS PCT: 22 %
MCH: 27.1 pg (ref 26.0–34.0)
MCHC: 32.1 g/dL (ref 32.0–36.0)
MCV: 84.3 fL (ref 80.0–100.0)
MONOS PCT: 10 %
Monocytes Absolute: 0.8 10*3/uL (ref 0.2–1.0)
NEUTROS PCT: 65 %
Neutro Abs: 5.1 10*3/uL (ref 1.4–6.5)
Platelets: 224 10*3/uL (ref 150–440)
RBC: 4.6 MIL/uL (ref 4.40–5.90)
RDW: 21.2 % — ABNORMAL HIGH (ref 11.5–14.5)
WBC: 7.9 10*3/uL (ref 3.8–10.6)

## 2015-09-25 LAB — BASIC METABOLIC PANEL
Anion gap: 6 (ref 5–15)
BUN: 12 mg/dL (ref 6–20)
CHLORIDE: 106 mmol/L (ref 101–111)
CO2: 25 mmol/L (ref 22–32)
CREATININE: 1 mg/dL (ref 0.61–1.24)
Calcium: 8.7 mg/dL — ABNORMAL LOW (ref 8.9–10.3)
GFR calc Af Amer: >60 mL/min (ref >60)
GFR calc non Af Amer: >60 mL/min (ref >60)
GLUCOSE: 112 mg/dL — AB (ref 65–99)
POTASSIUM: 3.3 mmol/L — AB (ref 3.5–5.1)
Sodium: 137 mmol/L (ref 135–145)

## 2015-09-25 MED ORDER — AMLODIPINE BESYLATE 10 MG PO TABS: 10.0000 mg | ORAL_TABLET | Freq: Every day | ORAL | Status: DC

## 2015-09-25 MED ORDER — SODIUM CHLORIDE 0.9 % IV SOLN
Freq: Once | INTRAVENOUS | Status: AC
  Administered 2015-09-25: 12:00:00 via INTRAVENOUS
  Filled 2015-09-25: qty 8

## 2015-09-25 MED ORDER — LEUCOVORIN CALCIUM INJECTION 350 MG: 400.0000 mg/m2 | Freq: Once | INTRAVENOUS | Status: DC

## 2015-09-25 MED ORDER — SODIUM CHLORIDE 0.9 % IV SOLN
Freq: Once | INTRAVENOUS | Status: AC
  Administered 2015-09-25: 11:00:00 via INTRAVENOUS
  Filled 2015-09-25: qty 100

## 2015-09-25 MED ORDER — HEPARIN SOD (PORK) LOCK FLUSH 100 UNIT/ML IV SOLN: 500.0000 [IU] | Freq: Once | INTRAVENOUS | Status: DC

## 2015-09-25 MED ORDER — ATROPINE SULFATE 0.4 MG/ML IJ SOLN
0.4000 mg | Freq: Once | INTRAMUSCULAR | Status: AC | PRN
  Administered 2015-09-25: 0.4 mg via INTRAVENOUS
  Filled 2015-09-25: qty 1

## 2015-09-25 MED ORDER — SODIUM CHLORIDE 0.9 % IJ SOLN
10.0000 mL | Freq: Once | INTRAMUSCULAR | Status: AC
  Administered 2015-09-25: 10 mL via INTRAVENOUS
  Filled 2015-09-25: qty 10

## 2015-09-25 MED ORDER — SODIUM CHLORIDE 0.9 % IV SOLN
2400.0000 mg/m2 | INTRAVENOUS | Status: AC
  Administered 2015-09-25: 4650 mg via INTRAVENOUS
  Filled 2015-09-25: qty 93

## 2015-09-25 MED ORDER — IRINOTECAN HCL CHEMO INJECTION 100 MG/5ML
150.0000 mg/m2 | Freq: Once | INTRAVENOUS | Status: AC
  Administered 2015-09-25: 292 mg via INTRAVENOUS
  Filled 2015-09-25: qty 4.87

## 2015-09-25 MED ORDER — SODIUM CHLORIDE 0.9 % IV SOLN
Freq: Once | INTRAVENOUS | Status: AC
  Administered 2015-09-25: 10:00:00 via INTRAVENOUS
  Filled 2015-09-25: qty 1000

## 2015-09-25 MED ORDER — LEUCOVORIN CALCIUM INJECTION 100 MG
20.0000 mg/m2 | Freq: Once | INTRAMUSCULAR | Status: AC
  Administered 2015-09-25: 52 mg via INTRAVENOUS
  Filled 2015-09-25: qty 2.6

## 2015-09-25 MED ORDER — OXYCODONE HCL 5 MG PO TABS: 5.0000 mg | ORAL_TABLET | ORAL | Status: DC | PRN

## 2015-09-25 MED ORDER — FLUOROURACIL CHEMO INJECTION 2.5 GM/50ML
400.0000 mg/m2 | Freq: Once | INTRAVENOUS | Status: AC
Start: 2015-09-25 — End: 2015-09-25
  Administered 2015-09-25: 800 mg via INTRAVENOUS
  Filled 2015-09-25: qty 16

## 2015-09-25 NOTE — Progress Notes (Signed)
[name] @ [facility] [phone]  [phone] [place]  [name] OB: 1960-10-04  MR#: [id number]  FBX#:[id number]  Patient Care Team: [name], MD as PCP - General (Family Medicine) [name], MD as Consulting Physician (General Surgery)  CHIEF COMPLAINT:  Chief Complaint  Patient presents with  . Colon Cancer   1.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 Specimen integrity: Intact. Macroscopic tumor perforation: Not identified. Invasive tumor: Maximum size: 5.5 cm. Histologic type(s): Invasive adenocarcinoma. Histologic grade and differentiation: G1: well differentiated/low grade  Pathologic Staging: pT4b, pN1b, pM1b Ancillary studies MSI stable K-ras wild-type not mutated by Foundation study.    2.Patient was started on FOLFOX in September of 2016,after 2 cycles of chemotherapy patient developed neuropathy.  Patient had a previous neuropathy as a baseline which increased so was switched over to  FOLFIRI I  IN December of 2016 As patient has clear os wild-type we can proceed to add either a Avastin or cetuximab from the next chemotherapy.(November, 2016)   Avastin was added as abdominal wound has completely healed now Avastin on hold (December, 2016) because of hypertension  VISIT DIAGNOSIS:     ICD-9-CM ICD-10-CM   1. 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      INTERVAL HISTORY: 55 year old gentleman is somewhat depressed.  Started having increasing pain in the left lower abdominal area since March of 2016.  P  After discontinuing potassium.  Patient's nausea has improved.  Three-way abdomen did  not reveal any evidence of obstruction. Patient also decided to stop taking the lisinopril claiming that was causing nausea and headache. Patient reports significant improvement in nausea and headache.  Here for further follow-up and treatment consideration REVIEW OF SYSTEMS:   Review of system: GENERAL: Patient is feeling somewhat stronger  PERFORMANCE STATUS (ECOG):  01 HEENT:  No visual changes, runny nose, sore throat, mouth sores or tenderness. Lungs: No shortness of breath or cough.  No hemoptysis. Cardiac:  No chest pain, palpitations, orthopnea, or PND. GI:  Patient has persistent nausea.  Abdominal discomfort.  Poor oral intake.   Musculoskeletal:  No back pain.  No joint pain.  No muscle tenderness. GU: No hematuria. Extremities:  No pain or swelling. Skin:  No rashes or skin changes. Neuro:  Numbness in upper and lower extremity as well as on the face has been stabilized. Endocrine:  No diabetes, thyroid issues, hot flashes or night sweats. Psych:  No mood changes, depression or anxiety. Pain:  No focal pain.  Patient had only one episode of diarrhea No significant abdominal crampy pain Patient has a history of sarcoidosis diagnosis in 2014 Was given Lyrica for neuropathy.  But patient has discontinued that because of progressive joint pains Review of systems:  All other systems reviewed and found to be negative.   As per HPI. Otherwise, a complete review of systems is negatve.  PAST MEDICAL HISTORY: Past Medical History  Diagnosis Date  . Sarcoid ([place]) 10/2012  . Neuropathy ([place])   . 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 Besnier-Boeck disease ([place]) 12/21/2012  . Extreme obesity ([place]) 12/21/2012  . LBP (low back  pain) 05/28/2014  . Adiposity 03/21/2015  . PONV (postoperative nausea and vomiting)   . Iron deficiency anemia due to chronic blood loss 05/08/2015  . Hypertension     has been off BP med x 6 months  . Arthritis   . GERD  (gastroesophageal reflux disease)   . Abdominal pain     mid abdomen  . History of kidney stones   . History of transfusion   . Colon cancer ([place])   . Skin cancer     "it flairs up with sarcoidosis"    PAST SURGICAL HISTORY: Past Surgical History  Procedure Laterality Date  . Cardiac catheterization    . Flexible sigmoidoscopy N/A 05/07/2015    Procedure: FLEXIBLE SIGMOIDOSCOPY;  Surgeon: Laurence Spates, MD;  Location: [facility];  Service: Endoscopy;  Laterality: N/A;  . Laparoscopic partial colectomy N/A 05/07/2015    Procedure: LAPAROSCOPIC ASSITED PARTIAL COLECTOMY WITH COLOSTOMY, SMALL BOWEL RESECTION, EXCISION OF PERITONEAL NODULE;  Surgeon: [name], MD;  Location: [facility];  Service: General;  Laterality: N/A;  . Portacath placement Right 05/27/2015    Procedure: INSERTION PORT-A-CATH;  Surgeon: [name], MD;  Location: [facility];  Service: General;  Laterality: Right;    FAMILY HISTORY Family History  Problem Relation Age of Onset  . Arthritis Mother   . Heart disease Mother   . Stroke Father         ADVANCED DIRECTIVES:  Patient does not have any living will or healthcare power of attorney.  Information was given .  Available resources had been discussed.  We will follow-up on subsequent appointments regarding this issue  HEALTH MAINTENANCE: Social History  Substance Use Topics  . Smoking status: Passive Smoke Exposure - Never Smoker  . Smokeless tobacco: Never Used  . Alcohol Use: No      Allergies  Allergen Reactions  . Fentanyl Nausea And Vomiting    IV med  . Versed [Midazolam] Nausea And Vomiting  . Gabapentin     Swallowing problems  . Paba Derivatives Nausea And Vomiting    Pt states he is allergic to unknown anesthesia. Pt has nausea and vomiting with anesthesia.     Current Outpatient Prescriptions  Medication Sig Dispense Refill  . AMBIEN 10 MG tablet Take 10 mg by mouth at bedtime as needed for sleep.   4  . fentaNYL (DURAGESIC -  DOSED MCG/HR) 50 MCG/HR Place 1 patch (50 mcg total) onto the skin every 3 (three) days. 10 patch 0  . lidocaine-prilocaine (EMLA) cream Apply 1 application topically as needed. 30 g 3  . ondansetron (ZOFRAN) 4 MG tablet Take 1 tablet (4 mg total) by mouth every 6 (six) hours as needed for nausea. 60 tablet 3  . oxyCODONE (OXY IR/ROXICODONE) 5 MG immediate release tablet Take 1 tablet (5 mg total) by mouth every 4 (four) hours as needed for severe pain. 50 tablet 0  . potassium chloride SA (K-DUR,KLOR-CON) 20 MEQ tablet Take 1 tablet (20 mEq total) by mouth daily. 30 tablet 0  . promethazine (PHENERGAN) 25 MG tablet Take 1 tablet (25 mg total) by mouth every 6 (six) hours as needed for nausea or vomiting. 60 tablet 0  . amLODipine (NORVASC) 10 MG tablet Take 1 tablet (10 mg total) by mouth daily. 30 tablet 3   No current facility-administered medications for this visit.   Facility-Administered Medications Ordered in Other Visits  Medication Dose Route Frequency Provider Last Rate Last Dose  . heparin lock flush 100 unit/mL  500 Units Intravenous Once Forest Gleason, MD      . sodium chloride 0.9 % injection 10 mL  10 mL Intracatheter PRN Forest Gleason, MD   10 mL at 08/02/15 0955    OBJECTIVE: PHYSICAL EXAM: GENERAL:  Well developed, well nourished, Patient is in somewhat pain and in distress. MENTAL STATUS:  Alert and oriented to person, place and time.   RESPIRATORY:  Clear to auscultation without rales, wheezes or rhonchi. CARDIOVASCULAR:  Regular rate and rhythm without murmur, rub or gallop.  ABDOMEN:  Soft, non-tender, with active bowel sounds, and no hepatosplenomegaly.  No masses. Wound is still draining. Patient is putting Neosporin Colostomy functioning well BACK:  No CVA tenderness.  No tenderness on percussion of the back or rib cage. SKIN:  No rashes, ulcers or lesions. EXTREMITIES: No edema, no skin discoloration or tenderness.  No palpable cords. LYMPH NODES: No palpable  cervical, supraclavicular, axillary or inguinal adenopathy  NEUROLOGICAL: Unremarkable. PSYCH:  depression  Filed Vitals:   09/25/15 0928  BP: 145/98  Pulse: 98  Temp: 96.7 F (35.9 C)  Resp: 18     Body mass index is 39.71 kg/(m^2).    ECOG FS:1 - Symptomatic but completely ambulatory  LAB RESULTS:  Infusion on 09/25/2015  Component Date Value Ref Range Status  . WBC 09/25/2015 7.9  3.8 - 10.6 K/uL Final  . RBC 09/25/2015 4.60  4.40 - 5.90 MIL/uL Final  . Hemoglobin 09/25/2015 12.4* 13.0 - 18.0 g/dL Final  . HCT 09/25/2015 38.7* 40.0 - 52.0 % Final  . MCV 09/25/2015 84.3  80.0 - 100.0 fL Final  . MCH 09/25/2015 27.1  26.0 - 34.0 pg Final  . MCHC 09/25/2015 32.1  32.0 - 36.0 g/dL Final  . RDW 09/25/2015 21.2* 11.5 - 14.5 % Final  . Platelets 09/25/2015 224  150 - 440 K/uL Final  . Neutrophils Relative % 09/25/2015 65   Final  . Neutro Abs 09/25/2015 5.1  1.4 - 6.5 K/uL Final  . Lymphocytes Relative 09/25/2015 22   Final  . Lymphs Abs 09/25/2015 1.7  1.0 - 3.6 K/uL Final  . Monocytes Relative 09/25/2015 10   Final  . Monocytes Absolute 09/25/2015 0.8  0.2 - 1.0 K/uL Final  . Eosinophils Relative 09/25/2015 2   Final  . Eosinophils Absolute 09/25/2015 0.1  0 - 0.7 K/uL Final  . Basophils Relative 09/25/2015 1   Final  . Basophils Absolute 09/25/2015 0.1  0 - 0.1 K/uL Final  . Sodium 09/25/2015 137  135 - 145 mmol/L Final  . Potassium 09/25/2015 3.3* 3.5 - 5.1 mmol/L Final  . Chloride 09/25/2015 106  101 - 111 mmol/L Final  . CO2 09/25/2015 25  22 - 32 mmol/L Final  . Glucose, Bld 09/25/2015 112* 65 - 99 mg/dL Final  . BUN 09/25/2015 12  6 - 20 mg/dL Final  . Creatinine, Ser 09/25/2015 1.00  0.61 - 1.24 mg/dL Final  . Calcium 09/25/2015 8.7* 8.9 - 10.3 mg/dL Final  . GFR calc non Af Amer 09/25/2015 >60  >60 mL/min Final  . GFR calc Af Amer 09/25/2015 >60  >60 mL/min Final   Comment: (NOTE) The eGFR has been calculated using the CKD EPI equation. This calculation has not  been validated in all clinical situations. eGFR's persistently <60 mL/min signify possible Chronic Kidney Disease.   . Anion gap 09/25/2015 6  5 - 15 Final     ASSESSMENT:  Carcinoma of distal descending colon T4 b(small bowel involvement) N1 B (  2 of 15 lymph nodes positive)   M1 B2 tumor(  Peritoneal nodules) 2.sarcoidosis with involvement of mediastinal lymph node and axillary lymph node by history. 3.  Three-way abdomen was ordered because of persistent nausea.  Which did not reveal any evidence of small bowel obstruction This has been reviewed independently  4.  Hypertension.  Diastolic blood pressure was high patient was advised to increase lisinopril.  Patient has been off antihypertensive medication 5.  Hold chemotherapy today pending reevaluation  6.  CEA stable 7.  Hypokalemia and clinical sign of dehydration patient will get IV fluid and IV potassium 8.  K-ras not mutated by Foundation   One study     PLAN:    continue chemotherapy with   FOLFIRI  Hold Avastin because of hypertension. NORVASC  has been started to control hypertension   Patient expressed understanding and was in agreement with this plan. He also understands that He can call clinic at any time with any questions, concerns, or complaints.    No matching staging information was found for the patient.  Forest Gleason, MD   09/25/2015 10:15 AM

## 2015-09-26 ENCOUNTER — Telehealth: Payer: Self-pay | Admitting: *Deleted

## 2015-09-26 MED ORDER — PROCHLORPERAZINE MALEATE 10 MG PO TABS: 10.0000 mg | ORAL_TABLET | Freq: Three times a day (TID) | ORAL | Status: DC | PRN

## 2015-09-26 NOTE — Telephone Encounter (Signed)
Per Dr Oliva Bustard  Compazine 10 mg tid prn # 30 tabs e scribed and Roxanne notified

## 2015-09-26 NOTE — Telephone Encounter (Signed)
Current nausea med is not working and she is asking if it can be changed to something else. He takes it and vomits 20 mins later

## 2015-09-27 ENCOUNTER — Inpatient Hospital Stay: Payer: BLUE CROSS/BLUE SHIELD

## 2015-09-27 VITALS — BP 124/82 | HR 90 | Temp 98.2°F | Resp 18

## 2015-09-27 DIAGNOSIS — C186 Malignant neoplasm of descending colon: Secondary | ICD-10-CM | POA: Diagnosis not present

## 2015-09-27 MED ORDER — HEPARIN SOD (PORK) LOCK FLUSH 100 UNIT/ML IV SOLN
500.0000 [IU] | Freq: Once | INTRAVENOUS | Status: AC | PRN
  Administered 2015-09-27: 500 [IU]

## 2015-09-27 MED ORDER — SODIUM CHLORIDE 0.9 % IJ SOLN
10.0000 mL | INTRAMUSCULAR | Status: DC | PRN
  Administered 2015-09-27: 10 mL
  Filled 2015-09-27: qty 10

## 2015-10-07 ENCOUNTER — Telehealth: Payer: Self-pay | Admitting: *Deleted

## 2015-10-07 DIAGNOSIS — C186 Malignant neoplasm of descending colon: Secondary | ICD-10-CM

## 2015-10-07 MED ORDER — PROCHLORPERAZINE MALEATE 10 MG PO TABS: 10.0000 mg | ORAL_TABLET | Freq: Three times a day (TID) | ORAL | Status: DC | PRN

## 2015-10-07 MED ORDER — OXYCODONE HCL 5 MG PO TABS: 5.0000 mg | ORAL_TABLET | ORAL | Status: DC | PRN

## 2015-10-07 NOTE — Telephone Encounter (Signed)
Informed that prescription is ready to pick up  

## 2015-10-14 ENCOUNTER — Encounter (HOSPITAL_COMMUNITY): Payer: Self-pay

## 2015-10-14 ENCOUNTER — Inpatient Hospital Stay: Payer: BLUE CROSS/BLUE SHIELD

## 2015-10-14 ENCOUNTER — Encounter: Payer: Self-pay | Admitting: Oncology

## 2015-10-14 ENCOUNTER — Inpatient Hospital Stay (HOSPITAL_BASED_OUTPATIENT_CLINIC_OR_DEPARTMENT_OTHER): Payer: BLUE CROSS/BLUE SHIELD | Admitting: Oncology

## 2015-10-14 VITALS — BP 157/99 | HR 90 | Temp 96.7°F | Resp 18 | Wt 285.9 lb

## 2015-10-14 DIAGNOSIS — Z933 Colostomy status: Secondary | ICD-10-CM | POA: Diagnosis not present

## 2015-10-14 DIAGNOSIS — Z79899 Other long term (current) drug therapy: Secondary | ICD-10-CM

## 2015-10-14 DIAGNOSIS — C186 Malignant neoplasm of descending colon: Secondary | ICD-10-CM

## 2015-10-14 DIAGNOSIS — E876 Hypokalemia: Secondary | ICD-10-CM | POA: Diagnosis not present

## 2015-10-14 DIAGNOSIS — I10 Essential (primary) hypertension: Secondary | ICD-10-CM

## 2015-10-14 LAB — CBC WITH DIFFERENTIAL/PLATELET
Basophils Absolute: 0 10*3/uL (ref 0–0.1)
Basophils Relative: 1 %
EOS ABS: 0.1 10*3/uL (ref 0–0.7)
EOS PCT: 3 %
HCT: 35.1 % — ABNORMAL LOW (ref 40.0–52.0)
Hemoglobin: 11.7 g/dL — ABNORMAL LOW (ref 13.0–18.0)
LYMPHS ABS: 1.2 10*3/uL (ref 1.0–3.6)
Lymphocytes Relative: 30 %
MCH: 28.1 pg (ref 26.0–34.0)
MCHC: 33.2 g/dL (ref 32.0–36.0)
MCV: 84.5 fL (ref 80.0–100.0)
Monocytes Absolute: 0.5 10*3/uL (ref 0.2–1.0)
Monocytes Relative: 13 %
NEUTROS ABS: 2.1 10*3/uL (ref 1.4–6.5)
NEUTROS PCT: 53 %
PLATELETS: 236 10*3/uL (ref 150–440)
RBC: 4.16 MIL/uL — AB (ref 4.40–5.90)
RDW: 19.1 % — AB (ref 11.5–14.5)
WBC: 4 10*3/uL (ref 3.8–10.6)

## 2015-10-14 LAB — COMPREHENSIVE METABOLIC PANEL
ALT: 23 U/L (ref 17–63)
ANION GAP: 5 (ref 5–15)
AST: 18 U/L (ref 15–41)
Albumin: 3.7 g/dL (ref 3.5–5.0)
Alkaline Phosphatase: 76 U/L (ref 38–126)
BUN: 6 mg/dL (ref 6–20)
CHLORIDE: 107 mmol/L (ref 101–111)
CO2: 25 mmol/L (ref 22–32)
Calcium: 8.4 mg/dL — ABNORMAL LOW (ref 8.9–10.3)
Creatinine, Ser: 0.85 mg/dL (ref 0.61–1.24)
GFR calc non Af Amer: >60 mL/min (ref >60)
Glucose, Bld: 130 mg/dL — ABNORMAL HIGH (ref 65–99)
POTASSIUM: 2.9 mmol/L — AB (ref 3.5–5.1)
SODIUM: 137 mmol/L (ref 135–145)
Total Bilirubin: 0.5 mg/dL (ref 0.3–1.2)
Total Protein: 7 g/dL (ref 6.5–8.1)

## 2015-10-14 LAB — MAGNESIUM: Magnesium: 1.8 mg/dL (ref 1.7–2.4)

## 2015-10-14 MED ORDER — SODIUM CHLORIDE 0.9 % IV SOLN
Freq: Once | INTRAVENOUS | Status: AC
  Administered 2015-10-14: 09:00:00 via INTRAVENOUS
  Filled 2015-10-14: qty 1000

## 2015-10-14 MED ORDER — FLUOROURACIL CHEMO INJECTION 2.5 GM/50ML
400.0000 mg/m2 | Freq: Once | INTRAVENOUS | Status: AC
  Administered 2015-10-14: 800 mg via INTRAVENOUS
  Filled 2015-10-14: qty 16

## 2015-10-14 MED ORDER — SODIUM CHLORIDE 0.9 % IV SOLN
Freq: Once | INTRAVENOUS | Status: AC
  Administered 2015-10-14: 10:00:00 via INTRAVENOUS
  Filled 2015-10-14: qty 250

## 2015-10-14 MED ORDER — LEUCOVORIN CALCIUM INJECTION 100 MG
20.0000 mg/m2 | Freq: Once | INTRAMUSCULAR | Status: AC
  Administered 2015-10-14: 52 mg via INTRAVENOUS
  Filled 2015-10-14: qty 2.6

## 2015-10-14 MED ORDER — ATROPINE SULFATE 1 MG/ML IJ SOLN
0.5000 mg | Freq: Once | INTRAMUSCULAR | Status: AC | PRN
  Administered 2015-10-14: 0.5 mg via INTRAVENOUS
  Filled 2015-10-14: qty 1

## 2015-10-14 MED ORDER — SODIUM CHLORIDE 0.9 % IV SOLN
2400.0000 mg/m2 | INTRAVENOUS | Status: DC
  Administered 2015-10-14: 4650 mg via INTRAVENOUS
  Filled 2015-10-14: qty 93

## 2015-10-14 MED ORDER — SODIUM CHLORIDE 0.9 % IV SOLN
Freq: Once | INTRAVENOUS | Status: AC
  Administered 2015-10-14: 12:00:00 via INTRAVENOUS
  Filled 2015-10-14: qty 8

## 2015-10-14 MED ORDER — OXYCODONE HCL 5 MG PO TABS: 5.0000 mg | ORAL_TABLET | ORAL | Status: DC | PRN

## 2015-10-14 MED ORDER — DEXTROSE 5 % IV SOLN
150.0000 mg/m2 | Freq: Once | INTRAVENOUS | Status: AC
  Administered 2015-10-14: 292 mg via INTRAVENOUS
  Filled 2015-10-14: qty 4.87

## 2015-10-14 MED ORDER — LEUCOVORIN CALCIUM INJECTION 350 MG: 400.0000 mg/m2 | Freq: Once | INTRAVENOUS | Status: DC

## 2015-10-14 NOTE — Progress Notes (Signed)
[place] @ [facility] Telephone:(336) [phone]  Fax:(336) [place]  BRAZEN DOMANGUE OB: 03/13/1961  MR#: [id number]  [id number]#:[id number]  Patient Care Team: [name], MD as PCP - General (Family Medicine) [name], MD as Consulting Physician (General Surgery)  CHIEF COMPLAINT:  Chief Complaint  Patient presents with  . Colon Cancer   1.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 Specimen integrity: Intact. Macroscopic tumor perforation: Not identified. Invasive tumor: Maximum size: 5.5 cm. Histologic type(s): Invasive adenocarcinoma. Histologic grade and differentiation: G1: well differentiated/low grade  Pathologic Staging: pT4b, pN1b, pM1b Ancillary studies MSI stable K-ras wild-type not mutated by Foundation study.    2.Patient was started on FOLFOX in September of 2016,after 2 cycles of chemotherapy patient developed neuropathy.  Patient had a previous neuropathy as a baseline which increased so was switched over to  FOLFIRI I  IN December of 2016 As patient has clear os wild-type we can proceed to add either a Avastin or cetuximab from the next chemotherapy.(November, 2016)   Avastin was added as abdominal wound has completely healed now Avastin on hold (December, 2016) because of hypertension  VISIT DIAGNOSIS:   No diagnosis found.    No history exists.      INTERVAL HISTORY: 55 year old gentleman is somewhat depressed.  Started having increasing pain in the left lower abdominal area since March of 2016.  P  After discontinuing potassium.  Patient's nausea has improved.  Three-way abdomen did not reveal any evidence of obstruction. Patient also decided to stop taking the lisinopril claiming that was causing nausea and headache. Patient reports significant improvement in nausea and headache.  Here for further follow-up and treatment  consideration  Patient is here to initiate ninth cycle of chemotherapy Nausea is improved after taking Compazine.  No abdominal pain.  No abdominal distention.  No tingling.  Numbness continues REVIEW OF SYSTEMS:   Review of system: GENERAL: Patient is feeling somewhat stronger  PERFORMANCE STATUS (ECOG):  01 HEENT:  No visual changes, runny nose, sore throat, mouth sores or tenderness. Lungs: No shortness of breath or cough.  No hemoptysis. Cardiac:  No chest pain, palpitations, orthopnea, or PND. GI:  Patient has persistent nausea.  Abdominal discomfort.  Poor oral intake.   Musculoskeletal:  No back pain.  No joint pain.  No muscle tenderness. GU: No hematuria. Extremities:  No pain or swelling. Skin:  No rashes or skin changes. Neuro:  Numbness in upper and lower extremity as well as on the face has been stabilized. Endocrine:  No diabetes, thyroid issues, hot flashes or night sweats. Psych:  No mood changes, depression or anxiety. Pain:  No focal pain.  Patient had only one episode of diarrhea No significant abdominal crampy pain Patient has a history of sarcoidosis diagnosis in 2014 Was given Lyrica for neuropathy.  But patient has discontinued that because of progressive joint pains Review of systems:  All other systems reviewed and found to be negative.   As per HPI. Otherwise, a complete review of systems is negatve.  PAST MEDICAL HISTORY: Past Medical History  Diagnosis Date  . Sarcoid ([place]) 10/2012  . Neuropathy ([place])   . 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 Besnier-Boeck disease ([place]) 12/21/2012  . Extreme obesity ([place]) 12/21/2012  . LBP (low back pain) 05/28/2014  . Adiposity 03/21/2015  . PONV (postoperative nausea and vomiting)   . Iron deficiency anemia due to chronic blood  loss 05/08/2015  . Hypertension     has been off BP med x 6 months  . Arthritis   . GERD (gastroesophageal reflux disease)   . Abdominal pain     mid abdomen  .  History of kidney stones   . History of transfusion   . Colon cancer ([place])   . Skin cancer     "it flairs up with sarcoidosis"    PAST SURGICAL HISTORY: Past Surgical History  Procedure Laterality Date  . Cardiac catheterization    . Flexible sigmoidoscopy N/A 05/07/2015    Procedure: FLEXIBLE SIGMOIDOSCOPY;  Surgeon: Laurence Spates, MD;  Location: [facility];  Service: Endoscopy;  Laterality: N/A;  . Laparoscopic partial colectomy N/A 05/07/2015    Procedure: LAPAROSCOPIC ASSITED PARTIAL COLECTOMY WITH COLOSTOMY, SMALL BOWEL RESECTION, EXCISION OF PERITONEAL NODULE;  Surgeon: [name], MD;  Location: [facility];  Service: General;  Laterality: N/A;  . Portacath placement Right 05/27/2015    Procedure: INSERTION PORT-A-CATH;  Surgeon: [name], MD;  Location: [facility];  Service: General;  Laterality: Right;    FAMILY HISTORY Family History  Problem Relation Age of Onset  . Arthritis Mother   . Heart disease Mother   . Stroke Father         ADVANCED DIRECTIVES:  Patient does not have any living will or healthcare power of attorney.  Information was given .  Available resources had been discussed.  We will follow-up on subsequent appointments regarding this issue  HEALTH MAINTENANCE: Social History  Substance Use Topics  . Smoking status: Passive Smoke Exposure - Never Smoker  . Smokeless tobacco: Never Used  . Alcohol Use: No      Allergies  Allergen Reactions  . Fentanyl Nausea And Vomiting    IV med  . Versed [Midazolam] Nausea And Vomiting  . Gabapentin     Swallowing problems  . Paba Derivatives Nausea And Vomiting    Pt states he is allergic to unknown anesthesia. Pt has nausea and vomiting with anesthesia.     Current Outpatient Prescriptions  Medication Sig Dispense Refill  . AMBIEN 10 MG tablet Take 10 mg by mouth at bedtime as needed for sleep.   4  . amLODipine (NORVASC) 10 MG tablet Take 1 tablet (10 mg total) by mouth daily. 30 tablet 3  .  fentaNYL (DURAGESIC - DOSED MCG/HR) 50 MCG/HR Place 1 patch (50 mcg total) onto the skin every 3 (three) days. 10 patch 0  . lidocaine-prilocaine (EMLA) cream Apply 1 application topically as needed. 30 g 3  . oxyCODONE (OXY IR/ROXICODONE) 5 MG immediate release tablet Take 1 tablet (5 mg total) by mouth every 4 (four) hours as needed for severe pain. 50 tablet 0  . prochlorperazine (COMPAZINE) 10 MG tablet Take 1 tablet (10 mg total) by mouth every 8 (eight) hours as needed for nausea or vomiting. 30 tablet 0   No current facility-administered medications for this visit.   Facility-Administered Medications Ordered in Other Visits  Medication Dose Route Frequency Provider Last Rate Last Dose  . sodium chloride 0.9 % injection 10 mL  10 mL Intracatheter PRN Forest Gleason, MD   10 mL at 08/02/15 0955    OBJECTIVE: PHYSICAL EXAM: GENERAL:  Well developed, well nourished, Patient is in somewhat pain and in distress. MENTAL STATUS:  Alert and oriented to person, place and time.   RESPIRATORY:  Clear to auscultation without rales, wheezes or rhonchi. CARDIOVASCULAR:  Regular rate and rhythm without murmur, rub or gallop.  ABDOMEN:  Soft, non-tender, with active bowel sounds, and no hepatosplenomegaly.  No masses. Wound is still draining. Patient is putting Neosporin Colostomy functioning well BACK:  No CVA tenderness.  No tenderness on percussion of the back or rib cage. SKIN:  No rashes, ulcers or lesions. EXTREMITIES: No edema, no skin discoloration or tenderness.  No palpable cords. LYMPH NODES: No palpable cervical, supraclavicular, axillary or inguinal adenopathy  NEUROLOGICAL: Unremarkable. PSYCH:  depression  Filed Vitals:   10/14/15 0905  BP: 157/99  Pulse: 90  Temp: 96.7 F (35.9 C)  Resp: 18     Body mass index is 39.9 kg/(m^2).    ECOG FS:1 - Symptomatic but completely ambulatory  LAB RESULTS:  Infusion on 10/14/2015  Component Date Value Ref Range Status  . WBC  10/14/2015 4.0  3.8 - 10.6 K/uL Final  . RBC 10/14/2015 4.16* 4.40 - 5.90 MIL/uL Final  . Hemoglobin 10/14/2015 11.7* 13.0 - 18.0 g/dL Final  . HCT 10/14/2015 35.1* 40.0 - 52.0 % Final  . MCV 10/14/2015 84.5  80.0 - 100.0 fL Final  . MCH 10/14/2015 28.1  26.0 - 34.0 pg Final  . MCHC 10/14/2015 33.2  32.0 - 36.0 g/dL Final  . RDW 10/14/2015 19.1* 11.5 - 14.5 % Final  . Platelets 10/14/2015 236  150 - 440 K/uL Final  . Neutrophils Relative % 10/14/2015 53   Final  . Neutro Abs 10/14/2015 2.1  1.4 - 6.5 K/uL Final  . Lymphocytes Relative 10/14/2015 30   Final  . Lymphs Abs 10/14/2015 1.2  1.0 - 3.6 K/uL Final  . Monocytes Relative 10/14/2015 13   Final  . Monocytes Absolute 10/14/2015 0.5  0.2 - 1.0 K/uL Final  . Eosinophils Relative 10/14/2015 3   Final  . Eosinophils Absolute 10/14/2015 0.1  0 - 0.7 K/uL Final  . Basophils Relative 10/14/2015 1   Final  . Basophils Absolute 10/14/2015 0.0  0 - 0.1 K/uL Final  . Sodium 10/14/2015 137  135 - 145 mmol/L Final  . Potassium 10/14/2015 2.9* 3.5 - 5.1 mmol/L Final   Comment: RESULTS VERIFIED BY REPEAT TESTING CRITICAL RESULT CALLED TO, READ BACK BY AND VERIFIED WITH ANITA BLACK AT 8280 10/14/2015 KMR   . Chloride 10/14/2015 107  101 - 111 mmol/L Final  . CO2 10/14/2015 25  22 - 32 mmol/L Final  . Glucose, Bld 10/14/2015 130* 65 - 99 mg/dL Final  . BUN 10/14/2015 6  6 - 20 mg/dL Final  . Creatinine, Ser 10/14/2015 0.85  0.61 - 1.24 mg/dL Final  . Calcium 10/14/2015 8.4* 8.9 - 10.3 mg/dL Final  . Total Protein 10/14/2015 7.0  6.5 - 8.1 g/dL Final  . Albumin 10/14/2015 3.7  3.5 - 5.0 g/dL Final  . AST 10/14/2015 18  15 - 41 U/L Final  . ALT 10/14/2015 23  17 - 63 U/L Final  . Alkaline Phosphatase 10/14/2015 76  38 - 126 U/L Final  . Total Bilirubin 10/14/2015 0.5  0.3 - 1.2 mg/dL Final  . GFR calc non Af Amer 10/14/2015 >60  >60 mL/min Final  . GFR calc Af Amer 10/14/2015 >60  >60 mL/min Final   Comment: (NOTE) The eGFR has been calculated  using the CKD EPI equation. This calculation has not been validated in all clinical situations. eGFR's persistently <60 mL/min signify possible Chronic Kidney Disease.   . Anion gap 10/14/2015 5  5 - 15 Final  . 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 3.  Three-way abdomen was ordered because of persistent nausea.  Which did not reveal any evidence of small bowel obstruction This has been reviewed independently  4.  Hypertension.  Diastolic blood pressure was high patient was advised to increase lisinopril.  Patient has been off antihypertensive medication 's and continues to high diastolic blood pressures with Avastin cannot be given. Hypokalemia He does not tolerate oral potassium IV potassium 40.  Equal and would be given.     PLAN:    continue chemotherapy with   FOLFIRI  Hold Avastin because of hypertension. NORVASC  has been started to control hypertension   Patient expressed understanding and was in agreement with this plan. He also understands that He can call clinic at any time with any questions, concerns, or complaints.    No matching staging information was found for the patient.  Forest Gleason, MD   10/14/2015 9:09 AM

## 2015-10-14 NOTE — Progress Notes (Signed)
Kim from Clinical Lab called with critical K+ - 2.9. MD notified.

## 2015-10-16 ENCOUNTER — Inpatient Hospital Stay: Payer: BLUE CROSS/BLUE SHIELD | Attending: Oncology

## 2015-10-16 VITALS — BP 141/92 | HR 60

## 2015-10-16 DIAGNOSIS — Z85828 Personal history of other malignant neoplasm of skin: Secondary | ICD-10-CM | POA: Diagnosis not present

## 2015-10-16 DIAGNOSIS — E669 Obesity, unspecified: Secondary | ICD-10-CM | POA: Diagnosis not present

## 2015-10-16 DIAGNOSIS — Z933 Colostomy status: Secondary | ICD-10-CM | POA: Diagnosis not present

## 2015-10-16 DIAGNOSIS — Z87442 Personal history of urinary calculi: Secondary | ICD-10-CM | POA: Diagnosis not present

## 2015-10-16 DIAGNOSIS — I10 Essential (primary) hypertension: Secondary | ICD-10-CM | POA: Insufficient documentation

## 2015-10-16 DIAGNOSIS — Z5111 Encounter for antineoplastic chemotherapy: Secondary | ICD-10-CM | POA: Insufficient documentation

## 2015-10-16 DIAGNOSIS — K219 Gastro-esophageal reflux disease without esophagitis: Secondary | ICD-10-CM | POA: Diagnosis not present

## 2015-10-16 DIAGNOSIS — D869 Sarcoidosis, unspecified: Secondary | ICD-10-CM | POA: Diagnosis not present

## 2015-10-16 DIAGNOSIS — Z79899 Other long term (current) drug therapy: Secondary | ICD-10-CM | POA: Diagnosis not present

## 2015-10-16 DIAGNOSIS — E876 Hypokalemia: Secondary | ICD-10-CM | POA: Insufficient documentation

## 2015-10-16 DIAGNOSIS — M1990 Unspecified osteoarthritis, unspecified site: Secondary | ICD-10-CM | POA: Insufficient documentation

## 2015-10-16 DIAGNOSIS — F1920 Other psychoactive substance dependence, uncomplicated: Secondary | ICD-10-CM | POA: Diagnosis not present

## 2015-10-16 DIAGNOSIS — C186 Malignant neoplasm of descending colon: Secondary | ICD-10-CM | POA: Diagnosis present

## 2015-10-16 MED ORDER — SODIUM CHLORIDE 0.9% FLUSH
10.0000 mL | Freq: Once | INTRAVENOUS | Status: AC
  Administered 2015-10-16: 10 mL via INTRAVENOUS
  Filled 2015-10-16: qty 10

## 2015-10-16 MED ORDER — HEPARIN SOD (PORK) LOCK FLUSH 100 UNIT/ML IV SOLN
500.0000 [IU] | Freq: Once | INTRAVENOUS | Status: AC
  Administered 2015-10-16: 500 [IU] via INTRAVENOUS
  Filled 2015-10-16: qty 5

## 2015-10-21 ENCOUNTER — Other Ambulatory Visit: Payer: Self-pay | Admitting: Oncology

## 2015-10-28 ENCOUNTER — Inpatient Hospital Stay: Payer: BLUE CROSS/BLUE SHIELD

## 2015-10-28 ENCOUNTER — Inpatient Hospital Stay: Payer: BLUE CROSS/BLUE SHIELD | Admitting: Oncology

## 2015-10-28 ENCOUNTER — Telehealth: Payer: Self-pay | Admitting: Pharmacist

## 2015-10-28 VITALS — BP 132/96 | HR 80 | Temp 96.6°F | Wt 285.3 lb

## 2015-10-28 DIAGNOSIS — C186 Malignant neoplasm of descending colon: Secondary | ICD-10-CM

## 2015-10-28 LAB — COMPREHENSIVE METABOLIC PANEL
ALBUMIN: 3.7 g/dL (ref 3.5–5.0)
ALK PHOS: 76 U/L (ref 38–126)
ALT: 23 U/L (ref 17–63)
ANION GAP: 5 (ref 5–15)
AST: 17 U/L (ref 15–41)
BILIRUBIN TOTAL: 0.4 mg/dL (ref 0.3–1.2)
BUN: 10 mg/dL (ref 6–20)
CALCIUM: 8.6 mg/dL — AB (ref 8.9–10.3)
CO2: 25 mmol/L (ref 22–32)
CREATININE: 0.91 mg/dL (ref 0.61–1.24)
Chloride: 109 mmol/L (ref 101–111)
GFR calc Af Amer: >60 mL/min (ref >60)
GFR calc non Af Amer: >60 mL/min (ref >60)
GLUCOSE: 153 mg/dL — AB (ref 65–99)
Potassium: 3.2 mmol/L — ABNORMAL LOW (ref 3.5–5.1)
SODIUM: 139 mmol/L (ref 135–145)
TOTAL PROTEIN: 6.6 g/dL (ref 6.5–8.1)

## 2015-10-28 LAB — CBC WITH DIFFERENTIAL/PLATELET
BASOS PCT: 1 %
Basophils Absolute: 0 10*3/uL (ref 0–0.1)
EOS ABS: 0.1 10*3/uL (ref 0–0.7)
Eosinophils Relative: 1 %
HEMATOCRIT: 33.6 % — AB (ref 40.0–52.0)
HEMOGLOBIN: 11.2 g/dL — AB (ref 13.0–18.0)
LYMPHS ABS: 1.4 10*3/uL (ref 1.0–3.6)
Lymphocytes Relative: 30 %
MCH: 28.4 pg (ref 26.0–34.0)
MCHC: 33.2 g/dL (ref 32.0–36.0)
MCV: 85.4 fL (ref 80.0–100.0)
MONOS PCT: 8 %
Monocytes Absolute: 0.4 10*3/uL (ref 0.2–1.0)
NEUTROS ABS: 2.7 10*3/uL (ref 1.4–6.5)
NEUTROS PCT: 60 %
Platelets: 207 10*3/uL (ref 150–440)
RBC: 3.93 MIL/uL — AB (ref 4.40–5.90)
RDW: 17.6 % — ABNORMAL HIGH (ref 11.5–14.5)
WBC: 4.6 10*3/uL (ref 3.8–10.6)

## 2015-10-28 LAB — MAGNESIUM: Magnesium: 2 mg/dL (ref 1.7–2.4)

## 2015-10-28 MED ORDER — FLUOROURACIL CHEMO INJECTION 2.5 GM/50ML
400.0000 mg/m2 | Freq: Once | INTRAVENOUS | Status: AC
  Administered 2015-10-28: 800 mg via INTRAVENOUS
  Filled 2015-10-28: qty 16

## 2015-10-28 MED ORDER — SODIUM CHLORIDE 0.9 % IV SOLN
Freq: Once | INTRAVENOUS | Status: AC
  Administered 2015-10-28: 11:00:00 via INTRAVENOUS
  Filled 2015-10-28: qty 1000

## 2015-10-28 MED ORDER — LEUCOVORIN CALCIUM INJECTION 350 MG: 400.0000 mg/m2 | Freq: Once | INTRAVENOUS | Status: DC

## 2015-10-28 MED ORDER — SODIUM CHLORIDE 0.9 % IV SOLN
Freq: Once | INTRAVENOUS | Status: AC
  Administered 2015-10-28: 11:00:00 via INTRAVENOUS
  Filled 2015-10-28: qty 100

## 2015-10-28 MED ORDER — OXYCODONE HCL 5 MG PO TABS: 5.0000 mg | ORAL_TABLET | ORAL | Status: DC | PRN

## 2015-10-28 MED ORDER — IRINOTECAN HCL CHEMO INJECTION 100 MG/5ML
150.0000 mg/m2 | Freq: Once | INTRAVENOUS | Status: AC
  Administered 2015-10-28: 292 mg via INTRAVENOUS
  Filled 2015-10-28: qty 4.87

## 2015-10-28 MED ORDER — ATROPINE SULFATE 1 MG/ML IJ SOLN
0.5000 mg | Freq: Once | INTRAMUSCULAR | Status: AC | PRN
  Administered 2015-10-28: 0.5 mg via INTRAVENOUS
  Filled 2015-10-28: qty 1

## 2015-10-28 MED ORDER — SODIUM CHLORIDE 0.9 % IV SOLN
Freq: Once | INTRAVENOUS | Status: AC
  Administered 2015-10-28: 13:00:00 via INTRAVENOUS
  Filled 2015-10-28: qty 8

## 2015-10-28 MED ORDER — SODIUM CHLORIDE 0.9 % IV SOLN
2400.0000 mg/m2 | INTRAVENOUS | Status: DC
  Administered 2015-10-28: 4650 mg via INTRAVENOUS
  Filled 2015-10-28: qty 93

## 2015-10-28 MED ORDER — SODIUM CHLORIDE 0.9 % IJ SOLN
10.0000 mL | INTRAMUSCULAR | Status: DC | PRN
  Administered 2015-10-28: 10 mL
  Filled 2015-10-28: qty 10

## 2015-10-28 MED ORDER — SODIUM CHLORIDE 0.9% FLUSH
10.0000 mL | INTRAVENOUS | Status: DC | PRN
  Administered 2015-10-28: 10 mL via INTRAVENOUS
  Filled 2015-10-28: qty 10

## 2015-10-28 MED ORDER — HEPARIN SOD (PORK) LOCK FLUSH 100 UNIT/ML IV SOLN: 500.0000 [IU] | Freq: Once | INTRAVENOUS | Status: DC | PRN

## 2015-10-28 MED ORDER — FENTANYL 50 MCG/HR TD PT72: 50.0000 ug | MEDICATED_PATCH | TRANSDERMAL | Status: DC

## 2015-10-28 MED ORDER — LEUCOVORIN CALCIUM INJECTION 350 MG
800.0000 mg | Freq: Once | INTRAVENOUS | Status: AC
  Administered 2015-10-28: 800 mg via INTRAVENOUS
  Filled 2015-10-28: qty 40

## 2015-10-28 MED ORDER — HEPARIN SOD (PORK) LOCK FLUSH 100 UNIT/ML IV SOLN: 500.0000 [IU] | Freq: Once | INTRAVENOUS | Status: DC

## 2015-10-28 NOTE — Progress Notes (Unsigned)
[place] @ [facility] Telephone:(336) [phone]  Fax:(336) [place]  [name] OB: 02/22/1961  MR#: [id number]  VFI#:[id number]  Patient Care Team: [name], MD as PCP - General (Family Medicine) [name], MD as Consulting Physician (General Surgery)  CHIEF COMPLAINT:  Chief Complaint  Patient presents with  . Colon Cancer   1.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 Specimen integrity: Intact. Macroscopic tumor perforation: Not identified. Invasive tumor: Maximum size: 5.5 cm. Histologic type(s): Invasive adenocarcinoma. Histologic grade and differentiation: G1: well differentiated/low grade  Pathologic Staging: pT4b, pN1b, pM1b Ancillary studies MSI stable K-ras wild-type not mutated by Foundation study.    2.Patient was started on FOLFOX in September of 2016,after 2 cycles of chemotherapy patient developed neuropathy.  Patient had a previous neuropathy as a baseline which increased so was switched over to  FOLFIRI I  IN December of 2016 As patient has clear os wild-type we can proceed to add either a Avastin or cetuximab from the next chemotherapy.(November, 2016)   Avastin was added as abdominal wound has completely healed now Avastin on hold (December, 2016) because of hypertension  VISIT DIAGNOSIS:   No diagnosis found.    No history exists.      INTERVAL HISTORY: 55 year old gentleman is somewhat depressed.  Started having increasing pain in the left lower abdominal area since March of 2016.  P  After discontinuing potassium.  Patient's nausea has improved.  Three-way abdomen did not reveal any evidence of obstruction. Patient also decided to stop taking the lisinopril claiming that was causing nausea and headache. Patient reports significant improvement in nausea and headache.  Here for further follow-up and treatment  consideration  Patient is here to initiate ninth cycle of chemotherapy Nausea is improved after taking Compazine.  No abdominal pain.  No abdominal distention.  No tingling.  Numbness continues REVIEW OF SYSTEMS:   Review of system: GENERAL: Patient is feeling somewhat stronger  PERFORMANCE STATUS (ECOG):  01 HEENT:  No visual changes, runny nose, sore throat, mouth sores or tenderness. Lungs: No shortness of breath or cough.  No hemoptysis. Cardiac:  No chest pain, palpitations, orthopnea, or PND. GI:  Patient has persistent nausea.  Abdominal discomfort.  Poor oral intake.   Musculoskeletal:  No back pain.  No joint pain.  No muscle tenderness. GU: No hematuria. Extremities:  No pain or swelling. Skin:  No rashes or skin changes. Neuro:  Numbness in upper and lower extremity as well as on the face has been stabilized. Endocrine:  No diabetes, thyroid issues, hot flashes or night sweats. Psych:  No mood changes, depression or anxiety. Pain:  No focal pain.  Patient had only one episode of diarrhea No significant abdominal crampy pain Patient has a history of sarcoidosis diagnosis in 2014 Was given Lyrica for neuropathy.  But patient has discontinued that because of progressive joint pains Review of systems:  All other systems reviewed and found to be negative.   As per HPI. Otherwise, a complete review of systems is negatve.  PAST MEDICAL HISTORY: Past Medical History  Diagnosis Date  . Sarcoid ([place]) 10/2012  . Neuropathy ([place])   . 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 Besnier-Boeck disease ([place]) 12/21/2012  . Extreme obesity ([place]) 12/21/2012  . LBP (low back pain) 05/28/2014  . Adiposity 03/21/2015  . PONV (postoperative nausea and vomiting)   . Iron deficiency anemia due to chronic blood  loss 05/08/2015  . Hypertension     has been off BP med x 6 months  . Arthritis   . GERD (gastroesophageal reflux disease)   . Abdominal pain     mid abdomen  .  History of kidney stones   . History of transfusion   . Colon cancer ([place])   . Skin cancer     "it flairs up with sarcoidosis"    PAST SURGICAL HISTORY: Past Surgical History  Procedure Laterality Date  . Cardiac catheterization    . Flexible sigmoidoscopy N/A 05/07/2015    Procedure: FLEXIBLE SIGMOIDOSCOPY;  Surgeon: Laurence Spates, MD;  Location: [facility];  Service: Endoscopy;  Laterality: N/A;  . Laparoscopic partial colectomy N/A 05/07/2015    Procedure: LAPAROSCOPIC ASSITED PARTIAL COLECTOMY WITH COLOSTOMY, SMALL BOWEL RESECTION, EXCISION OF PERITONEAL NODULE;  Surgeon: [name], MD;  Location: [facility];  Service: General;  Laterality: N/A;  . Portacath placement Right 05/27/2015    Procedure: INSERTION PORT-A-CATH;  Surgeon: [name], MD;  Location: [facility];  Service: General;  Laterality: Right;    FAMILY HISTORY Family History  Problem Relation Age of Onset  . Arthritis Mother   . Heart disease Mother   . Stroke Father         ADVANCED DIRECTIVES:  Patient does not have any living will or healthcare power of attorney.  Information was given .  Available resources had been discussed.  We will follow-up on subsequent appointments regarding this issue  HEALTH MAINTENANCE: Social History  Substance Use Topics  . Smoking status: Passive Smoke Exposure - Never Smoker  . Smokeless tobacco: Never Used  . Alcohol Use: No      Allergies  Allergen Reactions  . Fentanyl Nausea And Vomiting    IV med  . Versed [Midazolam] Nausea And Vomiting  . Gabapentin     Swallowing problems  . Paba Derivatives Nausea And Vomiting    Pt states he is allergic to unknown anesthesia. Pt has nausea and vomiting with anesthesia.     Current Outpatient Prescriptions  Medication Sig Dispense Refill  . AMBIEN 10 MG tablet Take 10 mg by mouth at bedtime as needed for sleep.   4  . amLODipine (NORVASC) 10 MG tablet Take 1 tablet (10 mg total) by mouth daily. 30 tablet 3  .  fentaNYL (DURAGESIC - DOSED MCG/HR) 50 MCG/HR Place 1 patch (50 mcg total) onto the skin every 3 (three) days. 10 patch 0  . lidocaine-prilocaine (EMLA) cream Apply 1 application topically as needed. 30 g 3  . oxyCODONE (OXY IR/ROXICODONE) 5 MG immediate release tablet Take 1 tablet (5 mg total) by mouth every 4 (four) hours as needed for severe pain. 50 tablet 0  . prochlorperazine (COMPAZINE) 10 MG tablet TAKE 1 TABLET BY MOUTH EVERY 8 HOURS AS NEEDED FOR NAUSEA OR VOMITING 30 tablet 1   No current facility-administered medications for this visit.   Facility-Administered Medications Ordered in Other Visits  Medication Dose Route Frequency Provider Last Rate Last Dose  . heparin lock flush 100 unit/mL  500 Units Intravenous Once Forest Gleason, MD      . sodium chloride 0.9 % injection 10 mL  10 mL Intracatheter PRN Forest Gleason, MD   10 mL at 08/02/15 0955  . sodium chloride flush (NS) 0.9 % injection 10 mL  10 mL Intravenous PRN Forest Gleason, MD   10 mL at 10/28/15 3545    OBJECTIVE: PHYSICAL EXAM: GENERAL:  Well developed, well nourished, Patient  is in somewhat pain and in distress. MENTAL STATUS:  Alert and oriented to person, place and time.   RESPIRATORY:  Clear to auscultation without rales, wheezes or rhonchi. CARDIOVASCULAR:  Regular rate and rhythm without murmur, rub or gallop.  ABDOMEN:  Soft, non-tender, with active bowel sounds, and no hepatosplenomegaly.  No masses. Wound is still draining. Patient is putting Neosporin Colostomy functioning well BACK:  No CVA tenderness.  No tenderness on percussion of the back or rib cage. SKIN:  No rashes, ulcers or lesions. EXTREMITIES: No edema, no skin discoloration or tenderness.  No palpable cords. LYMPH NODES: No palpable cervical, supraclavicular, axillary or inguinal adenopathy  NEUROLOGICAL: Unremarkable. PSYCH:  depression  Filed Vitals:   10/28/15 0956  BP: 132/96  Pulse: 80  Temp: 96.6 F (35.9 C)     Body mass index  is 39.81 kg/(m^2).    ECOG FS:1 - Symptomatic but completely ambulatory  LAB RESULTS:  Infusion on 10/28/2015  Component Date Value Ref Range Status  . WBC 10/28/2015 4.6  3.8 - 10.6 K/uL Final  . RBC 10/28/2015 3.93* 4.40 - 5.90 MIL/uL Final  . Hemoglobin 10/28/2015 11.2* 13.0 - 18.0 g/dL Final  . HCT 10/28/2015 33.6* 40.0 - 52.0 % Final  . MCV 10/28/2015 85.4  80.0 - 100.0 fL Final  . MCH 10/28/2015 28.4  26.0 - 34.0 pg Final  . MCHC 10/28/2015 33.2  32.0 - 36.0 g/dL Final  . RDW 10/28/2015 17.6* 11.5 - 14.5 % Final  . Platelets 10/28/2015 207  150 - 440 K/uL Final  . Neutrophils Relative % 10/28/2015 60   Final  . Neutro Abs 10/28/2015 2.7  1.4 - 6.5 K/uL Final  . Lymphocytes Relative 10/28/2015 30   Final  . Lymphs Abs 10/28/2015 1.4  1.0 - 3.6 K/uL Final  . Monocytes Relative 10/28/2015 8   Final  . Monocytes Absolute 10/28/2015 0.4  0.2 - 1.0 K/uL Final  . Eosinophils Relative 10/28/2015 1   Final  . Eosinophils Absolute 10/28/2015 0.1  0 - 0.7 K/uL Final  . Basophils Relative 10/28/2015 1   Final  . Basophils Absolute 10/28/2015 0.0  0 - 0.1 K/uL Final  . Sodium 10/28/2015 139  135 - 145 mmol/L Final  . Potassium 10/28/2015 3.2* 3.5 - 5.1 mmol/L Final  . Chloride 10/28/2015 109  101 - 111 mmol/L Final  . CO2 10/28/2015 25  22 - 32 mmol/L Final  . Glucose, Bld 10/28/2015 153* 65 - 99 mg/dL Final  . BUN 10/28/2015 10  6 - 20 mg/dL Final  . Creatinine, Ser 10/28/2015 0.91  0.61 - 1.24 mg/dL Final  . Calcium 10/28/2015 8.6* 8.9 - 10.3 mg/dL Final  . Total Protein 10/28/2015 6.6  6.5 - 8.1 g/dL Final  . Albumin 10/28/2015 3.7  3.5 - 5.0 g/dL Final  . AST 10/28/2015 17  15 - 41 U/L Final  . ALT 10/28/2015 23  17 - 63 U/L Final  . Alkaline Phosphatase 10/28/2015 76  38 - 126 U/L Final  . Total Bilirubin 10/28/2015 0.4  0.3 - 1.2 mg/dL Final  . GFR calc non Af Amer 10/28/2015 >60  >60 mL/min Final  . GFR calc Af Amer 10/28/2015 >60  >60 mL/min Final   Comment: (NOTE) The eGFR  has been calculated using the CKD EPI equation. This calculation has not been validated in all clinical situations. eGFR's persistently <60 mL/min signify possible Chronic Kidney Disease.   . Anion gap 10/28/2015 5  5 - 15 Final  .  Magnesium 10/28/2015 2.0  1.7 - 2.4 mg/dL Final     ASSESSMENT:  Carcinoma of distal descending colon T4 b(small bowel involvement) N1 B (2 of 15 lymph nodes positive)   M1 B2 tumor(  Peritoneal nodules) 2.sarcoidosis with involvement of mediastinal lymph node and axillary lymph node by history.   4.  Hypertension.  Diastolic blood pressure was high patient was advised to increase lisinopril.  Patient has been off antihypertensive medication 's and continues to high diastolic blood pressures with Avastin cannot be given. Does not take any antihypertensive medication. Hypokalemia He does not tolerate oral potassium IV potassium 40.  Equal and would be given.     PLAN:    continue chemotherapy with   FOLFIRI  Hold Avastin because of hypertension. NORVASC  has been started to control hypertension   Patient expressed understanding and was in agreement with this plan. He also understands that He can call clinic at any time with any questions, concerns, or complaints.    No matching staging information was found for the patient.  Forest Gleason, MD   10/28/2015 10:29 AM

## 2015-10-28 NOTE — Telephone Encounter (Signed)
Patient had Avastin listed on schedule for today. Per MD, no avastin today. Folfiri only.

## 2015-10-30 ENCOUNTER — Inpatient Hospital Stay: Payer: BLUE CROSS/BLUE SHIELD

## 2015-10-30 DIAGNOSIS — C801 Malignant (primary) neoplasm, unspecified: Secondary | ICD-10-CM

## 2015-10-30 DIAGNOSIS — C186 Malignant neoplasm of descending colon: Secondary | ICD-10-CM | POA: Diagnosis not present

## 2015-10-30 MED ORDER — SODIUM CHLORIDE 0.9% FLUSH
10.0000 mL | INTRAVENOUS | Status: DC | PRN
  Administered 2015-10-30: 10 mL via INTRAVENOUS
  Filled 2015-10-30: qty 10

## 2015-10-30 MED ORDER — HEPARIN SOD (PORK) LOCK FLUSH 100 UNIT/ML IV SOLN
500.0000 [IU] | Freq: Once | INTRAVENOUS | Status: AC
  Administered 2015-10-30: 500 [IU] via INTRAVENOUS
  Filled 2015-10-30: qty 5

## 2015-11-03 ENCOUNTER — Telehealth: Payer: Self-pay | Admitting: *Deleted

## 2015-11-03 MED ORDER — PROMETHAZINE HCL 25 MG PO TABS: 25.0000 mg | ORAL_TABLET | Freq: Four times a day (QID) | ORAL | Status: DC | PRN

## 2015-11-03 NOTE — Telephone Encounter (Signed)
Discussed with Dr Oliva Bustard, Notified patient of phenergan being called in and that it will make him drowsy. Also instructed to take Imodium AD for Diarrhea he said he is and that it is helping. Instructed to call back if diarrhea continues. He stated, "I will"

## 2015-11-03 NOTE — Telephone Encounter (Signed)
Asking if we would order something differnet for nausea, states current med seems to be making him anxious also reports that he has had diarrhea.

## 2015-11-06 ENCOUNTER — Telehealth: Payer: Self-pay | Admitting: *Deleted

## 2015-11-06 NOTE — Telephone Encounter (Signed)
Per Dr Oliva Bustard pt to keep appt and change chemo appt to IVF Mrs Mancera notified and thanked me for calling

## 2015-11-06 NOTE — Telephone Encounter (Signed)
Called to report that he says he will not take any more chemo because it has made him so sick. She is asking if they should keep his appt on 2/28 and if he will be given IVF so she knows if she needs to apply EMLA cream to his port site. And if you still want him to have labs drawn

## 2015-11-11 ENCOUNTER — Inpatient Hospital Stay (HOSPITAL_BASED_OUTPATIENT_CLINIC_OR_DEPARTMENT_OTHER): Payer: BLUE CROSS/BLUE SHIELD | Admitting: Oncology

## 2015-11-11 ENCOUNTER — Encounter: Payer: Self-pay | Admitting: Oncology

## 2015-11-11 ENCOUNTER — Inpatient Hospital Stay: Payer: BLUE CROSS/BLUE SHIELD

## 2015-11-11 VITALS — BP 141/101 | HR 118 | Temp 96.8°F | Resp 18 | Wt 287.3 lb

## 2015-11-11 VITALS — BP 115/81 | HR 99

## 2015-11-11 DIAGNOSIS — C186 Malignant neoplasm of descending colon: Secondary | ICD-10-CM

## 2015-11-11 DIAGNOSIS — Z933 Colostomy status: Secondary | ICD-10-CM | POA: Diagnosis not present

## 2015-11-11 DIAGNOSIS — F1920 Other psychoactive substance dependence, uncomplicated: Secondary | ICD-10-CM | POA: Diagnosis not present

## 2015-11-11 DIAGNOSIS — E876 Hypokalemia: Secondary | ICD-10-CM | POA: Diagnosis not present

## 2015-11-11 DIAGNOSIS — Z79899 Other long term (current) drug therapy: Secondary | ICD-10-CM

## 2015-11-11 LAB — COMPREHENSIVE METABOLIC PANEL
ALK PHOS: 83 U/L (ref 38–126)
ALT: 28 U/L (ref 17–63)
ANION GAP: 6 (ref 5–15)
AST: 22 U/L (ref 15–41)
Albumin: 3.9 g/dL (ref 3.5–5.0)
BUN: 8 mg/dL (ref 6–20)
CALCIUM: 8.5 mg/dL — AB (ref 8.9–10.3)
CO2: 24 mmol/L (ref 22–32)
Chloride: 106 mmol/L (ref 101–111)
Creatinine, Ser: 0.85 mg/dL (ref 0.61–1.24)
GFR calc non Af Amer: >60 mL/min (ref >60)
Glucose, Bld: 142 mg/dL — ABNORMAL HIGH (ref 65–99)
Potassium: 3 mmol/L — ABNORMAL LOW (ref 3.5–5.1)
SODIUM: 136 mmol/L (ref 135–145)
Total Bilirubin: 0.6 mg/dL (ref 0.3–1.2)
Total Protein: 7.3 g/dL (ref 6.5–8.1)

## 2015-11-11 LAB — CBC WITH DIFFERENTIAL/PLATELET
Basophils Absolute: 0 10*3/uL (ref 0–0.1)
Basophils Relative: 1 %
EOS ABS: 0.1 10*3/uL (ref 0–0.7)
EOS PCT: 2 %
HCT: 35.9 % — ABNORMAL LOW (ref 40.0–52.0)
HEMOGLOBIN: 11.9 g/dL — AB (ref 13.0–18.0)
LYMPHS ABS: 1.1 10*3/uL (ref 1.0–3.6)
Lymphocytes Relative: 21 %
MCH: 28.5 pg (ref 26.0–34.0)
MCHC: 33.2 g/dL (ref 32.0–36.0)
MCV: 85.8 fL (ref 80.0–100.0)
MONOS PCT: 9 %
Monocytes Absolute: 0.5 10*3/uL (ref 0.2–1.0)
Neutro Abs: 3.4 10*3/uL (ref 1.4–6.5)
Neutrophils Relative %: 67 %
PLATELETS: 196 10*3/uL (ref 150–440)
RBC: 4.19 MIL/uL — ABNORMAL LOW (ref 4.40–5.90)
RDW: 17 % — ABNORMAL HIGH (ref 11.5–14.5)
WBC: 5 10*3/uL (ref 3.8–10.6)

## 2015-11-11 LAB — MAGNESIUM: MAGNESIUM: 1.8 mg/dL (ref 1.7–2.4)

## 2015-11-11 MED ORDER — SODIUM CHLORIDE 0.9 % IV SOLN
Freq: Once | INTRAVENOUS | Status: AC
  Administered 2015-11-11: 10:00:00 via INTRAVENOUS
  Filled 2015-11-11: qty 4

## 2015-11-11 MED ORDER — OXYCODONE HCL 5 MG PO TABS: 5.0000 mg | ORAL_TABLET | ORAL | Status: DC | PRN

## 2015-11-11 MED ORDER — SODIUM CHLORIDE 0.9 % IV SOLN
Freq: Once | INTRAVENOUS | Status: AC
  Administered 2015-11-11: 11:00:00 via INTRAVENOUS
  Filled 2015-11-11: qty 1000

## 2015-11-11 MED ORDER — HEPARIN SOD (PORK) LOCK FLUSH 100 UNIT/ML IV SOLN
500.0000 [IU] | Freq: Once | INTRAVENOUS | Status: AC
  Administered 2015-11-11: 500 [IU] via INTRAVENOUS
  Filled 2015-11-11: qty 5

## 2015-11-11 NOTE — Progress Notes (Signed)
[name] @ [facility] Telephone:([phone]  Fax:(336) [place]  [name] OB: 26-Nov-1960  MR#: [id number]  JJO#:[id number]  Patient Care Team: [name] Pink, MD as PCP - General (Family Medicine) [name] Confer, MD as Consulting Physician (General Surgery)  CHIEF COMPLAINT:  Chief Complaint  Patient presents with  . Colon Cancer   1.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 Specimen integrity: Intact. Macroscopic tumor perforation: Not identified. Invasive tumor: Maximum size: 5.5 cm. Histologic type(s): Invasive adenocarcinoma. Histologic grade and differentiation: G1: well differentiated/low grade  Pathologic Staging: pT4b, pN1b, pM1b Ancillary studies MSI stable K-ras wild-type not mutated by Foundation study.    2.Patient was started on FOLFOX in September of 2016,after 2 cycles of chemotherapy patient developed neuropathy.  Patient had a previous neuropathy as a baseline which increased so was switched over to  FOLFIRI I  IN December of 2016 As patient has clear os wild-type we can proceed to add either a Avastin or cetuximab from the next chemotherapy.(November, 2016)   Avastin was added as abdominal wound has completely healed now Avastin on hold (December, 2016) because of hypertension  VISIT DIAGNOSIS:   No diagnosis found.    No history exists.      INTERVAL HISTORY: 55 year old gentleman is somewhat depressed.  Started having increasing pain in the left lower abdominal area since March of 2016.  P  After discontinuing potassium.  Patient's nausea has improved.  Three-way abdomen did not reveal any evidence of obstruction. Patient also decided to stop taking the lisinopril claiming that was causing nausea and headache. Patient reports significant improvement in nausea and headache.  Here for further follow-up and treatment consideration   After  her last chemotherapy patient had a frequent panic attack. After Compazine was discontinued anxiety has diminished.  But patient is feeling weak and tired.  No tingling numbness.  No abdominal pain no nausea no vomiting.  Feeling weak and tired.  Somewhat depressed. Does not want to continue chemotherapy   REVIEW OF SYSTEMS:   Review of system: No status: Patient is having frequent anxiety attacks.  Somewhat depressed.  Feeling weak and tired. HEENT: No soreness in the mouth. GI: Had frequent nausea Compazine did not help so Phenergan was tried.  Abdominal pain on persistent discomfort requiring Percocet.  Already on fentanyl patch. Lower extremity no swelling.  Skin: No rash. Complaints of night sweats. Patient desires strongly to get reversal of colostomy Here for further follow-up and treatment consideration  Neurological system no tingling numbness  Patient had only one episode of diarrhea No significant abdominal crampy pain Patient has a history of sarcoidosis diagnosis in 2014 Was given Lyrica for neuropathy.  But patient has discontinued that because of progressive joint pains Review of systems:  All other systems reviewed and found to be negative.   As per HPI. Otherwise, a complete review of systems is negatve.  PAST MEDICAL HISTORY: Past Medical History  Diagnosis Date  . Sarcoid ([place]) 10/2012  . Neuropathy ([place])   . 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 Besnier-Boeck disease ([place]) 12/21/2012  . Extreme obesity ([place]) 12/21/2012  . LBP (low back pain) 05/28/2014  . Adiposity 03/21/2015  . PONV (postoperative nausea and vomiting)   . Iron deficiency anemia due to chronic blood loss 05/08/2015  . Hypertension     has been off BP med x 6 months  . Arthritis   . GERD (  gastroesophageal reflux disease)   . Abdominal pain     mid abdomen  . History of kidney stones   . History of transfusion   . Colon cancer ([place])   . Skin cancer     "it flairs up with  sarcoidosis"    PAST SURGICAL HISTORY: Past Surgical History  Procedure Laterality Date  . Cardiac catheterization    . Flexible sigmoidoscopy N/A 05/07/2015    Procedure: FLEXIBLE SIGMOIDOSCOPY;  Surgeon: Laurence Spates, MD;  Location: [facility];  Service: Endoscopy;  Laterality: N/A;  . Laparoscopic partial colectomy N/A 05/07/2015    Procedure: LAPAROSCOPIC ASSITED PARTIAL COLECTOMY WITH COLOSTOMY, SMALL BOWEL RESECTION, EXCISION OF PERITONEAL NODULE;  Surgeon: [name] Confer, MD;  Location: [facility];  Service: General;  Laterality: N/A;  . Portacath placement Right 05/27/2015    Procedure: INSERTION PORT-A-CATH;  Surgeon: [name] Confer, MD;  Location: [facility];  Service: General;  Laterality: Right;    FAMILY HISTORY Family History  Problem Relation Age of Onset  . Arthritis Mother   . Heart disease Mother   . Stroke Father         ADVANCED DIRECTIVES:  Patient does not have any living will or healthcare power of attorney.  Information was given .  Available resources had been discussed.  We will follow-up on subsequent appointments regarding this issue  HEALTH MAINTENANCE: Social History  Substance Use Topics  . Smoking status: Passive Smoke Exposure - Never Smoker  . Smokeless tobacco: Never Used  . Alcohol Use: No      Allergies  Allergen Reactions  . Fentanyl Nausea And Vomiting    IV med  . Versed [Midazolam] Nausea And Vomiting  . Gabapentin     Swallowing problems  . Paba Derivatives Nausea And Vomiting    Pt states he is allergic to unknown anesthesia. Pt has nausea and vomiting with anesthesia.     Current Outpatient Prescriptions  Medication Sig Dispense Refill  . AMBIEN 10 MG tablet Take 10 mg by mouth at bedtime as needed for sleep.   4  . amLODipine (NORVASC) 10 MG tablet Take 1 tablet (10 mg total) by mouth daily. 30 tablet 3  . fentaNYL (DURAGESIC - DOSED MCG/HR) 50 MCG/HR Place 1 patch (50 mcg total) onto the skin every 3 (three) days. 10 patch  0  . lidocaine-prilocaine (EMLA) cream Apply 1 application topically as needed. 30 g 3  . oxyCODONE (OXY IR/ROXICODONE) 5 MG immediate release tablet Take 1 tablet (5 mg total) by mouth every 4 (four) hours as needed for severe pain. 50 tablet 0  . prochlorperazine (COMPAZINE) 10 MG tablet TAKE 1 TABLET BY MOUTH EVERY 8 HOURS AS NEEDED FOR NAUSEA OR VOMITING 30 tablet 1  . promethazine (PHENERGAN) 25 MG tablet Take 1 tablet (25 mg total) by mouth every 6 (six) hours as needed for nausea or vomiting. 30 tablet 0   No current facility-administered medications for this visit.   Facility-Administered Medications Ordered in Other Visits  Medication Dose Route Frequency Provider Last Rate Last Dose  . sodium chloride 0.9 % injection 10 mL  10 mL Intracatheter PRN Forest Gleason, MD   10 mL at 08/02/15 0955    OBJECTIVE: PHYSICAL EXAM: GENERAL:  Well developed, well nourished, Patient is in somewhat pain and in distress. MENTAL STATUS:  Alert and oriented to person, place and time.   RESPIRATORY:  Clear to auscultation without rales, wheezes or rhonchi. CARDIOVASCULAR:  Regular rate and rhythm without murmur, rub or gallop.  ABDOMEN:  Soft, non-tender, with active bowel sounds, and no hepatosplenomegaly.  No masses. Wound is still draining. Patient is putting Neosporin Colostomy functioning well BACK:  No CVA tenderness.  No tenderness on percussion of the back or rib cage. SKIN:  No rashes, ulcers or lesions. EXTREMITIES: No edema, no skin discoloration or tenderness.  No palpable cords. LYMPH NODES: No palpable cervical, supraclavicular, axillary or inguinal adenopathy  NEUROLOGICAL: Unremarkable. PSYCH:  depression  Filed Vitals:   11/11/15 0855  BP: 141/101  Pulse: 118  Temp: 96.8 F (36 C)  Resp: 18     Body mass index is 40.08 kg/(m^2).    ECOG FS:1 - Symptomatic but completely ambulatory  LAB RESULTS:  Appointment on 11/11/2015  Component Date Value Ref Range Status  .  Sodium 11/11/2015 136  135 - 145 mmol/L Final  . Potassium 11/11/2015 3.0* 3.5 - 5.1 mmol/L Final  . Chloride 11/11/2015 106  101 - 111 mmol/L Final  . CO2 11/11/2015 24  22 - 32 mmol/L Final  . Glucose, Bld 11/11/2015 142* 65 - 99 mg/dL Final  . BUN 11/11/2015 8  6 - 20 mg/dL Final  . Creatinine, Ser 11/11/2015 0.85  0.61 - 1.24 mg/dL Final  . Calcium 11/11/2015 8.5* 8.9 - 10.3 mg/dL Final  . Total Protein 11/11/2015 7.3  6.5 - 8.1 g/dL Final  . Albumin 11/11/2015 3.9  3.5 - 5.0 g/dL Final  . AST 11/11/2015 22  15 - 41 U/L Final  . ALT 11/11/2015 28  17 - 63 U/L Final  . Alkaline Phosphatase 11/11/2015 83  38 - 126 U/L Final  . Total Bilirubin 11/11/2015 0.6  0.3 - 1.2 mg/dL Final  . GFR calc non Af Amer 11/11/2015 >60  >60 mL/min Final  . GFR calc Af Amer 11/11/2015 >60  >60 mL/min Final   Comment: (NOTE) The eGFR has been calculated using the CKD EPI equation. This calculation has not been validated in all clinical situations. eGFR's persistently <60 mL/min signify possible Chronic Kidney Disease.   . Anion gap 11/11/2015 6  5 - 15 Final  . 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   4.  Hypertension.  Diastolic blood pressure was high patient was advised to increase lisinopril.  Patient has been off antihypertensive medication Patient's blood pressure still remains up   not sure whether he is taking lisinopril or not. Hypokalemia needs IV potassium.    PLAN:   We will hold off any further chemotherapy Will get a PET scan for complete restaging Depending on the results of the PET scan and further planning of treatment including potential reversal of colostomy Frequent panic attack. Compazine has been discontinued.   Patient may need psychiatric help as well as pain clinic appointment to gradually get patient off pain medication Patient is dependent on Percocet as well as fentanyl patch started during surgical intervention for colon cancer' 'Total duration of visit was 30  minutes.  50% or more time was spent in counseling patient and family regarding prognosis and options of treatment and available resources     Patient expressed understanding and was in agreement with this plan. He also understands that He can call clinic at any time with any questions, concerns,  or complaints.    No matching staging information was found for the patient.  Forest Gleason, MD   11/11/2015 9:13 AM

## 2015-11-14 ENCOUNTER — Telehealth: Payer: Self-pay | Admitting: *Deleted

## 2015-11-14 ENCOUNTER — Other Ambulatory Visit: Payer: Self-pay | Admitting: Family Medicine

## 2015-11-14 MED ORDER — AZITHROMYCIN 500 MG PO TABS: 500.0000 mg | ORAL_TABLET | Freq: Every day | ORAL | Status: DC

## 2015-11-14 NOTE — Telephone Encounter (Signed)
Azithromycin e scribed per VO Dr Oliva Bustard, Ms. Derderian informed

## 2015-11-14 NOTE — Telephone Encounter (Signed)
Called to request abx be called in for congestion and cough having chills also. Does not know if he has fever or not and if there is color to sputum, but she had the same thing and her doctor put on Amoxicillin which has helped her immensely

## 2015-11-17 ENCOUNTER — Telehealth: Payer: Self-pay | Admitting: *Deleted

## 2015-11-17 ENCOUNTER — Other Ambulatory Visit: Payer: Self-pay | Admitting: Family Medicine

## 2015-11-17 DIAGNOSIS — C186 Malignant neoplasm of descending colon: Secondary | ICD-10-CM

## 2015-11-17 NOTE — Telephone Encounter (Signed)
Asking for med to be called in for above sx. Per Dr Jeb Levering, needs to see PCP. Roxanne notified and acknowledged thsi

## 2015-11-24 ENCOUNTER — Ambulatory Visit
Admission: RE | Admit: 2015-11-24 | Discharge: 2015-11-24 | Disposition: A | Discharge status: Home / Self Care | Payer: BLUE CROSS/BLUE SHIELD | Source: Ambulatory Visit | Attending: Family Medicine | Admitting: Family Medicine

## 2015-11-24 ENCOUNTER — Inpatient Hospital Stay: Payer: BLUE CROSS/BLUE SHIELD | Attending: Oncology

## 2015-11-24 ENCOUNTER — Inpatient Hospital Stay: Payer: BLUE CROSS/BLUE SHIELD

## 2015-11-24 ENCOUNTER — Ambulatory Visit: Payer: BLUE CROSS/BLUE SHIELD

## 2015-11-24 DIAGNOSIS — Z933 Colostomy status: Secondary | ICD-10-CM | POA: Insufficient documentation

## 2015-11-24 DIAGNOSIS — Z79899 Other long term (current) drug therapy: Secondary | ICD-10-CM | POA: Diagnosis not present

## 2015-11-24 DIAGNOSIS — K219 Gastro-esophageal reflux disease without esophagitis: Secondary | ICD-10-CM | POA: Diagnosis not present

## 2015-11-24 DIAGNOSIS — Z87442 Personal history of urinary calculi: Secondary | ICD-10-CM | POA: Diagnosis not present

## 2015-11-24 DIAGNOSIS — D869 Sarcoidosis, unspecified: Secondary | ICD-10-CM | POA: Diagnosis not present

## 2015-11-24 DIAGNOSIS — N200 Calculus of kidney: Secondary | ICD-10-CM | POA: Insufficient documentation

## 2015-11-24 DIAGNOSIS — C186 Malignant neoplasm of descending colon: Secondary | ICD-10-CM | POA: Diagnosis present

## 2015-11-24 DIAGNOSIS — Z9049 Acquired absence of other specified parts of digestive tract: Secondary | ICD-10-CM | POA: Diagnosis not present

## 2015-11-24 DIAGNOSIS — K429 Umbilical hernia without obstruction or gangrene: Secondary | ICD-10-CM | POA: Diagnosis not present

## 2015-11-24 DIAGNOSIS — K760 Fatty (change of) liver, not elsewhere classified: Secondary | ICD-10-CM | POA: Diagnosis not present

## 2015-11-24 DIAGNOSIS — M1990 Unspecified osteoarthritis, unspecified site: Secondary | ICD-10-CM | POA: Diagnosis not present

## 2015-11-24 DIAGNOSIS — I10 Essential (primary) hypertension: Secondary | ICD-10-CM | POA: Diagnosis not present

## 2015-11-24 DIAGNOSIS — K439 Ventral hernia without obstruction or gangrene: Secondary | ICD-10-CM | POA: Insufficient documentation

## 2015-11-24 DIAGNOSIS — E669 Obesity, unspecified: Secondary | ICD-10-CM | POA: Insufficient documentation

## 2015-11-24 LAB — CBC WITH DIFFERENTIAL/PLATELET
BASOS ABS: 0 10*3/uL (ref 0–0.1)
Basophils Relative: 1 %
EOS PCT: 1 %
Eosinophils Absolute: 0.1 10*3/uL (ref 0–0.7)
HCT: 36.6 % — ABNORMAL LOW (ref 40.0–52.0)
Hemoglobin: 12.4 g/dL — ABNORMAL LOW (ref 13.0–18.0)
LYMPHS ABS: 2.1 10*3/uL (ref 1.0–3.6)
Lymphocytes Relative: 25 %
MCH: 28.7 pg (ref 26.0–34.0)
MCHC: 33.8 g/dL (ref 32.0–36.0)
MCV: 85 fL (ref 80.0–100.0)
MONO ABS: 0.7 10*3/uL (ref 0.2–1.0)
Monocytes Relative: 8 %
Neutro Abs: 5.4 10*3/uL (ref 1.4–6.5)
Neutrophils Relative %: 65 %
PLATELETS: 211 10*3/uL (ref 150–440)
RBC: 4.3 MIL/uL — ABNORMAL LOW (ref 4.40–5.90)
RDW: 17 % — AB (ref 11.5–14.5)
WBC: 8.2 10*3/uL (ref 3.8–10.6)

## 2015-11-24 LAB — COMPREHENSIVE METABOLIC PANEL
ALT: 35 U/L (ref 17–63)
ANION GAP: 6 (ref 5–15)
AST: 24 U/L (ref 15–41)
Albumin: 3.9 g/dL (ref 3.5–5.0)
Alkaline Phosphatase: 72 U/L (ref 38–126)
BUN: 9 mg/dL (ref 6–20)
CHLORIDE: 105 mmol/L (ref 101–111)
CO2: 27 mmol/L (ref 22–32)
Calcium: 8.7 mg/dL — ABNORMAL LOW (ref 8.9–10.3)
Creatinine, Ser: 0.9 mg/dL (ref 0.61–1.24)
Glucose, Bld: 91 mg/dL (ref 65–99)
POTASSIUM: 3.5 mmol/L (ref 3.5–5.1)
Sodium: 138 mmol/L (ref 135–145)
Total Bilirubin: 0.4 mg/dL (ref 0.3–1.2)
Total Protein: 7.9 g/dL (ref 6.5–8.1)

## 2015-11-24 LAB — MAGNESIUM: MAGNESIUM: 2.1 mg/dL (ref 1.7–2.4)

## 2015-11-24 MED ORDER — IOHEXOL 300 MG/ML  SOLN
125.0000 mL | Freq: Once | INTRAMUSCULAR | Status: AC | PRN
  Administered 2015-11-24: 125 mL via INTRAVENOUS

## 2015-11-25 ENCOUNTER — Encounter: Payer: Self-pay | Admitting: Oncology

## 2015-11-25 ENCOUNTER — Inpatient Hospital Stay (HOSPITAL_BASED_OUTPATIENT_CLINIC_OR_DEPARTMENT_OTHER): Payer: BLUE CROSS/BLUE SHIELD | Admitting: Oncology

## 2015-11-25 VITALS — BP 121/77 | HR 90 | Temp 95.8°F | Resp 18 | Wt 282.2 lb

## 2015-11-25 DIAGNOSIS — C186 Malignant neoplasm of descending colon: Secondary | ICD-10-CM | POA: Diagnosis not present

## 2015-11-25 DIAGNOSIS — Z933 Colostomy status: Secondary | ICD-10-CM

## 2015-11-25 DIAGNOSIS — Z79899 Other long term (current) drug therapy: Secondary | ICD-10-CM | POA: Diagnosis not present

## 2015-11-25 LAB — CEA: CEA: 1 ng/mL (ref 0.0–4.7)

## 2015-11-25 MED ORDER — FENTANYL 25 MCG/HR TD PT72: 25.0000 ug | MEDICATED_PATCH | TRANSDERMAL | Status: DC

## 2015-11-25 NOTE — Progress Notes (Signed)
Patient here for CT results.  States he continues to have shoulder and back pain.  Also has been having headaches.  Further states he has had nausea and diarrhea.  Taking imodium for diarrhea.

## 2015-11-25 NOTE — Progress Notes (Signed)
[place] @ [facility] Telephone:(336) [phone]  Fax:(336) [place]  ALBION WEATHERHOLTZ OB: 1960/09/21  MR#: [id number]  YTK#:[id number]  Patient Care Team: [name], MD as PCP - General (Family Medicine) [name], MD as Consulting Physician (General Surgery)  CHIEF COMPLAINT:  Chief Complaint  Patient presents with  . Cancer of decending colon   1.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 Specimen integrity: Intact. Macroscopic tumor perforation: Not identified. Invasive tumor: Maximum size: 5.5 cm. Histologic type(s): Invasive adenocarcinoma. Histologic grade and differentiation: G1: well differentiated/low grade  Pathologic Staging: pT4b, pN1b, pM1b Ancillary studies MSI stable K-ras wild-type not mutated by Foundation study.    2.Patient was started on FOLFOX in September of 2016,after 2 cycles of chemotherapy patient developed neuropathy.  Patient had a previous neuropathy as a baseline which increased so was switched over to  FOLFIRI I  IN December of 2016 As patient has clear os wild-type we can proceed to add either a Avastin or cetuximab from the next chemotherapy.(November, 2016)   Avastin was added as abdominal wound has completely healed now Avastin on hold (December, 2016) because of hypertension Chemotherapy was discontinued after protocol 8 cycle because of significant side effect (last chemotherapy was on October 28, 2015) VISIT DIAGNOSIS:     ICD-9-CM ICD-10-CM   1. Cancer of descending colon w obstruction s/p colectomy/ostomy 05/07/2015 153.2 C18.6 fentaNYL (DURAGESIC - DOSED MCG/HR) 25 MCG/HR patch     CBC with Differential     Comprehensive metabolic panel     CEA      No history exists.      INTERVAL HISTORY: 55 year old gentleman is somewhat depressed.  Started having increasing pain in the left lower abdominal area since March of 2016.   P  After discontinuing potassium.  Patient's nausea has improved.  Three-way abdomen did not reveal any evidence of obstruction. Patient also decided to stop taking the lisinopril claiming that was causing nausea and headache. Patient reports significant improvement in nausea and headache.  Here for further follow-up and treatment consideration  Patient is here for ongoing evaluation and treatment consideration.  A repeat CT scan.  Patient is gradually improving.  Nausea is improved.   REVIEW OF SYSTEMS:   Review of system: No status: Patient is having frequent anxiety attacks.  Somewhat depressed.  Feeling weak and tired. HEENT: No soreness in the mouth. GI: Had frequent nausea Compazine did not help so Phenergan was tried.  Abdominal pain on persistent discomfort requiring Percocet.  Already on fentanyl patch. Lower extremity no swelling.  Skin: No rash. Complaints of night sweats. Patient desires strongly to get reversal of colostomy Here for further follow-up and treatment consideration  Neurological system no tingling numbness  Patient had only one episode of diarrhea No significant abdominal crampy pain Patient has a history of sarcoidosis diagnosis in 2014 Was given Lyrica for neuropathy.  But patient has discontinued that because of progressive joint pains Review of systems:  All other systems reviewed and found to be negative.   As per HPI. Otherwise, a complete review of systems is negatve.  PAST MEDICAL HISTORY: Past Medical History  Diagnosis Date  . Sarcoid ([place]) 10/2012  . Neuropathy ([place])   . 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 Besnier-Boeck disease ([place]) 12/21/2012  . Extreme obesity ([place]) 12/21/2012  . LBP (low back pain) 05/28/2014  . Adiposity 03/21/2015  . PONV (postoperative nausea and  vomiting)   . Iron deficiency anemia due to chronic blood loss 05/08/2015  . Hypertension     has been off BP med x 6 months  . Arthritis   . GERD  (gastroesophageal reflux disease)   . Abdominal pain     mid abdomen  . History of kidney stones   . History of transfusion   . Colon cancer ([place])   . Skin cancer     "it flairs up with sarcoidosis"    PAST SURGICAL HISTORY: Past Surgical History  Procedure Laterality Date  . Cardiac catheterization    . Flexible sigmoidoscopy N/A 05/07/2015    Procedure: FLEXIBLE SIGMOIDOSCOPY;  Surgeon: Laurence Spates, MD;  Location: [facility];  Service: Endoscopy;  Laterality: N/A;  . Laparoscopic partial colectomy N/A 05/07/2015    Procedure: LAPAROSCOPIC ASSITED PARTIAL COLECTOMY WITH COLOSTOMY, SMALL BOWEL RESECTION, EXCISION OF PERITONEAL NODULE;  Surgeon: [name], MD;  Location: [facility];  Service: General;  Laterality: N/A;  . Portacath placement Right 05/27/2015    Procedure: INSERTION PORT-A-CATH;  Surgeon: [name], MD;  Location: [facility];  Service: General;  Laterality: Right;    FAMILY HISTORY Family History  Problem Relation Age of Onset  . Arthritis Mother   . Heart disease Mother   . Stroke Father         ADVANCED DIRECTIVES:  Patient does not have any living will or healthcare power of attorney.  Information was given .  Available resources had been discussed.  We will follow-up on subsequent appointments regarding this issue  HEALTH MAINTENANCE: Social History  Substance Use Topics  . Smoking status: Passive Smoke Exposure - Never Smoker  . Smokeless tobacco: Never Used  . Alcohol Use: No      Allergies  Allergen Reactions  . Fentanyl Nausea And Vomiting    IV med  . Versed [Midazolam] Nausea And Vomiting  . Gabapentin     Swallowing problems  . Paba Derivatives Nausea And Vomiting    Pt states he is allergic to unknown anesthesia. Pt has nausea and vomiting with anesthesia.     Current Outpatient Prescriptions  Medication Sig Dispense Refill  . AMBIEN 10 MG tablet Take 10 mg by mouth at bedtime as needed for sleep.   4  . amLODipine (NORVASC)  10 MG tablet Take 1 tablet (10 mg total) by mouth daily. 30 tablet 3  . fentaNYL (DURAGESIC - DOSED MCG/HR) 25 MCG/HR patch Place 1 patch (25 mcg total) onto the skin every 3 (three) days. 10 patch 0  . lidocaine-prilocaine (EMLA) cream Apply 1 application topically as needed. 30 g 3  . oxyCODONE (OXY IR/ROXICODONE) 5 MG immediate release tablet Take 1 tablet (5 mg total) by mouth every 4 (four) hours as needed for severe pain. 50 tablet 0  . prochlorperazine (COMPAZINE) 10 MG tablet TAKE 1 TABLET BY MOUTH EVERY 8 HOURS AS NEEDED FOR NAUSEA OR VOMITING 30 tablet 1  . promethazine (PHENERGAN) 25 MG tablet Take 1 tablet (25 mg total) by mouth every 6 (six) hours as needed for nausea or vomiting. 30 tablet 0   No current facility-administered medications for this visit.   Facility-Administered Medications Ordered in Other Visits  Medication Dose Route Frequency Provider Last Rate Last Dose  . sodium chloride 0.9 % injection 10 mL  10 mL Intracatheter PRN Forest Gleason, MD   10 mL at 08/02/15 0955    OBJECTIVE: PHYSICAL EXAM: GENERAL:  Well developed, well nourished, Patient is in somewhat pain and  in distress. MENTAL STATUS:  Alert and oriented to person, place and time.   RESPIRATORY:  Clear to auscultation without rales, wheezes or rhonchi. CARDIOVASCULAR:  Regular rate and rhythm without murmur, rub or gallop.  ABDOMEN:  Soft, non-tender, with active bowel sounds, and no hepatosplenomegaly.  No masses. Wound is still draining. Patient is putting Neosporin Colostomy functioning well, and with liquid stool BACK:  No CVA tenderness.  No tenderness on percussion of the back or rib cage. SKIN:  No rashes, ulcers or lesions. EXTREMITIES: No edema, no skin discoloration or tenderness.  No palpable cords. LYMPH NODES: No palpable cervical, supraclavicular, axillary or inguinal adenopathy  NEUROLOGICAL: Unremarkable. PSYCH:  depression  Filed Vitals:   11/25/15 0902  BP: 121/77  Pulse: 90    Temp: 95.8 F (35.4 C)  Resp: 18     Body mass index is 39.37 kg/(m^2).    ECOG FS:1 - Symptomatic but completely ambulatory  LAB RESULTS:  Appointment on 11/24/2015  Component Date Value Ref Range Status  . WBC 11/24/2015 8.2  3.8 - 10.6 K/uL Final  . RBC 11/24/2015 4.30* 4.40 - 5.90 MIL/uL Final  . Hemoglobin 11/24/2015 12.4* 13.0 - 18.0 g/dL Final  . HCT 11/24/2015 36.6* 40.0 - 52.0 % Final  . MCV 11/24/2015 85.0  80.0 - 100.0 fL Final  . MCH 11/24/2015 28.7  26.0 - 34.0 pg Final  . MCHC 11/24/2015 33.8  32.0 - 36.0 g/dL Final  . RDW 11/24/2015 17.0* 11.5 - 14.5 % Final  . Platelets 11/24/2015 211  150 - 440 K/uL Final  . Neutrophils Relative % 11/24/2015 65   Final  . Neutro Abs 11/24/2015 5.4  1.4 - 6.5 K/uL Final  . Lymphocytes Relative 11/24/2015 25   Final  . Lymphs Abs 11/24/2015 2.1  1.0 - 3.6 K/uL Final  . Monocytes Relative 11/24/2015 8   Final  . Monocytes Absolute 11/24/2015 0.7  0.2 - 1.0 K/uL Final  . Eosinophils Relative 11/24/2015 1   Final  . Eosinophils Absolute 11/24/2015 0.1  0 - 0.7 K/uL Final  . Basophils Relative 11/24/2015 1   Final  . Basophils Absolute 11/24/2015 0.0  0 - 0.1 K/uL Final  . Sodium 11/24/2015 138  135 - 145 mmol/L Final  . Potassium 11/24/2015 3.5  3.5 - 5.1 mmol/L Final  . Chloride 11/24/2015 105  101 - 111 mmol/L Final  . CO2 11/24/2015 27  22 - 32 mmol/L Final  . Glucose, Bld 11/24/2015 91  65 - 99 mg/dL Final  . BUN 11/24/2015 9  6 - 20 mg/dL Final  . Creatinine, Ser 11/24/2015 0.90  0.61 - 1.24 mg/dL Final  . Calcium 11/24/2015 8.7* 8.9 - 10.3 mg/dL Final  . Total Protein 11/24/2015 7.9  6.5 - 8.1 g/dL Final  . Albumin 11/24/2015 3.9  3.5 - 5.0 g/dL Final  . AST 11/24/2015 24  15 - 41 U/L Final  . ALT 11/24/2015 35  17 - 63 U/L Final  . Alkaline Phosphatase 11/24/2015 72  38 - 126 U/L Final  . Total Bilirubin 11/24/2015 0.4  0.3 - 1.2 mg/dL Final  . GFR calc non Af Amer 11/24/2015 >60  >60 mL/min Final  . GFR calc Af Amer  11/24/2015 >60  >60 mL/min Final   Comment: (NOTE) The eGFR has been calculated using the CKD EPI equation. This calculation has not been validated in all clinical situations. eGFR's persistently <60 mL/min signify possible Chronic Kidney Disease.   . Anion gap 11/24/2015 6  5 - 15 Final  . Magnesium 11/24/2015 2.1  1.7 - 2.4 mg/dL Final  . CEA 11/24/2015 1.0  0.0 - 4.7 ng/mL Final   Comment: (NOTE)       Roche ECLIA methodology       Nonsmokers  <3.9                                     Smokers     <5.6 Performed At: [facility] [place], [place] [id number] Lindon Romp MD MO:[id number]      ASSESSMENT:  Carcinoma of distal descending colon T4 b(small bowel involvement) N1 B (2 of 15 lymph nodes positive)   M1 B2 tumor(  Peritoneal nodules) 2.sarcoidosis with involvement of mediastinal lymph node and axillary lymph node by history. All lab data has been reviewed.  CEA is within normal limits.  Patient's insurance did not approve CT scan of chest as well as PET scan so CT scan of abdomen and pelvis was done which did not reveal any evidence of recurrent or metastatic disease.   2, sinus chronic pain which may not have been related with malignancy or chemotherapy and was advised to decrease fentanyl patch to 25 g.  The pain persistent pain clinic appointment can be made. 3.  DiscussED   result of the CT scan I will discuss that situation with patient's surgeon from [place] [facility] to decide on further surgical intervention regarding reversal of colostomy. 4.  Patient would be seen by me in this 3 months for reevaluation CT scan has been reviewed independently reviewed with the patient. All the questions regarding malignancy has been answered. Total duration of visit was 30 minutes.  50% or more time was spent in counseling patient and family regarding prognosis and options of treatment and available resources  Discussion regarding CT scan results.  As  well as possibility of reversal of colostomy and gradually decreasing fentanyl patch which has been now decreased to 25 g every 3 days  We discussed situation with surgeon regarding reversal of colostomy Patient expressed understanding and was in agreement with this plan. He also understands that He can call clinic at any time with any questions, concerns, or complaints.    No matching staging information was found for the patient.  Forest Gleason, MD   11/25/2015 9:39 AM

## 2015-11-29 ENCOUNTER — Encounter: Payer: Self-pay | Admitting: Oncology

## 2015-12-03 ENCOUNTER — Telehealth: Payer: Self-pay | Admitting: *Deleted

## 2015-12-03 DIAGNOSIS — C186 Malignant neoplasm of descending colon: Secondary | ICD-10-CM

## 2015-12-03 MED ORDER — OXYCODONE HCL 5 MG PO TABS: 5.0000 mg | ORAL_TABLET | ORAL | Status: DC | PRN

## 2015-12-03 NOTE — Telephone Encounter (Signed)
Called patient's wife to let her know prescription is available for pickup.

## 2015-12-03 NOTE — Addendum Note (Signed)
Addended by: Telford Nab on: 12/03/2015 10:15 AM   Modules accepted: Orders

## 2015-12-03 NOTE — Telephone Encounter (Signed)
Patient requesting refill for Oxycodone 5 mg.

## 2015-12-03 NOTE — Telephone Encounter (Signed)
Prescription will be ready for pick up at 12pm today.

## 2015-12-04 NOTE — Telephone Encounter (Signed)
Entered in error

## 2015-12-15 ENCOUNTER — Inpatient Hospital Stay: Payer: BLUE CROSS/BLUE SHIELD | Attending: Oncology

## 2015-12-15 DIAGNOSIS — Z452 Encounter for adjustment and management of vascular access device: Secondary | ICD-10-CM | POA: Insufficient documentation

## 2015-12-15 DIAGNOSIS — C186 Malignant neoplasm of descending colon: Secondary | ICD-10-CM | POA: Diagnosis present

## 2015-12-15 DIAGNOSIS — C801 Malignant (primary) neoplasm, unspecified: Secondary | ICD-10-CM

## 2015-12-15 MED ORDER — SODIUM CHLORIDE 0.9% FLUSH
10.0000 mL | INTRAVENOUS | Status: DC | PRN
  Administered 2015-12-15: 10 mL via INTRAVENOUS
  Filled 2015-12-15: qty 10

## 2015-12-15 MED ORDER — HEPARIN SOD (PORK) LOCK FLUSH 100 UNIT/ML IV SOLN
500.0000 [IU] | Freq: Once | INTRAVENOUS | Status: AC
  Administered 2015-12-15: 500 [IU] via INTRAVENOUS
  Filled 2015-12-15: qty 5

## 2015-12-22 ENCOUNTER — Other Ambulatory Visit: Payer: Self-pay | Admitting: *Deleted

## 2015-12-22 DIAGNOSIS — C186 Malignant neoplasm of descending colon: Secondary | ICD-10-CM

## 2015-12-22 MED ORDER — OXYCODONE HCL 5 MG PO TABS: 5.0000 mg | ORAL_TABLET | ORAL | Status: DC | PRN

## 2015-12-29 ENCOUNTER — Other Ambulatory Visit: Payer: Self-pay | Admitting: *Deleted

## 2015-12-29 DIAGNOSIS — C186 Malignant neoplasm of descending colon: Secondary | ICD-10-CM

## 2015-12-29 MED ORDER — FENTANYL 25 MCG/HR TD PT72: 25.0000 ug | MEDICATED_PATCH | TRANSDERMAL | Status: DC

## 2016-01-06 ENCOUNTER — Other Ambulatory Visit: Payer: Self-pay | Admitting: *Deleted

## 2016-01-06 DIAGNOSIS — C186 Malignant neoplasm of descending colon: Secondary | ICD-10-CM

## 2016-01-06 MED ORDER — OXYCODONE HCL 5 MG PO TABS: 5.0000 mg | ORAL_TABLET | ORAL | Status: DC | PRN

## 2016-01-06 NOTE — Telephone Encounter (Signed)
Per Dr Oliva Bustard, refer to pain clinic, will fill rx until seen by pain management.  I advised Roxanne that he is being referred to pain management and she was not pleased, stating "he has been to the pain clinic before and they said he had so much pain that they could not help him." I advised her that Dr Oliva Bustard said he will only fill pain med until he is seen by pain management. She stated "alright"

## 2016-01-28 ENCOUNTER — Other Ambulatory Visit: Payer: Self-pay | Admitting: *Deleted

## 2016-01-28 DIAGNOSIS — C186 Malignant neoplasm of descending colon: Secondary | ICD-10-CM

## 2016-01-28 MED ORDER — FENTANYL 25 MCG/HR TD PT72: 25.0000 ug | MEDICATED_PATCH | TRANSDERMAL | Status: DC

## 2016-02-11 ENCOUNTER — Inpatient Hospital Stay: Payer: BLUE CROSS/BLUE SHIELD

## 2016-02-11 ENCOUNTER — Inpatient Hospital Stay: Payer: BLUE CROSS/BLUE SHIELD | Attending: Oncology

## 2016-02-11 ENCOUNTER — Inpatient Hospital Stay (HOSPITAL_BASED_OUTPATIENT_CLINIC_OR_DEPARTMENT_OTHER): Payer: BLUE CROSS/BLUE SHIELD | Admitting: Oncology

## 2016-02-11 VITALS — BP 153/84 | HR 72 | Temp 96.2°F | Resp 18 | Wt 287.3 lb

## 2016-02-11 DIAGNOSIS — Z7722 Contact with and (suspected) exposure to environmental tobacco smoke (acute) (chronic): Secondary | ICD-10-CM | POA: Diagnosis not present

## 2016-02-11 DIAGNOSIS — G894 Chronic pain syndrome: Secondary | ICD-10-CM

## 2016-02-11 DIAGNOSIS — Z9221 Personal history of antineoplastic chemotherapy: Secondary | ICD-10-CM | POA: Insufficient documentation

## 2016-02-11 DIAGNOSIS — Z79899 Other long term (current) drug therapy: Secondary | ICD-10-CM | POA: Diagnosis not present

## 2016-02-11 DIAGNOSIS — D869 Sarcoidosis, unspecified: Secondary | ICD-10-CM | POA: Diagnosis not present

## 2016-02-11 DIAGNOSIS — C186 Malignant neoplasm of descending colon: Secondary | ICD-10-CM | POA: Insufficient documentation

## 2016-02-11 DIAGNOSIS — Z87442 Personal history of urinary calculi: Secondary | ICD-10-CM | POA: Diagnosis not present

## 2016-02-11 DIAGNOSIS — Z933 Colostomy status: Secondary | ICD-10-CM

## 2016-02-11 DIAGNOSIS — Z9049 Acquired absence of other specified parts of digestive tract: Secondary | ICD-10-CM | POA: Insufficient documentation

## 2016-02-11 DIAGNOSIS — I10 Essential (primary) hypertension: Secondary | ICD-10-CM | POA: Insufficient documentation

## 2016-02-11 DIAGNOSIS — M1990 Unspecified osteoarthritis, unspecified site: Secondary | ICD-10-CM | POA: Diagnosis not present

## 2016-02-11 DIAGNOSIS — K219 Gastro-esophageal reflux disease without esophagitis: Secondary | ICD-10-CM | POA: Diagnosis not present

## 2016-02-11 DIAGNOSIS — Z95828 Presence of other vascular implants and grafts: Secondary | ICD-10-CM

## 2016-02-11 DIAGNOSIS — E669 Obesity, unspecified: Secondary | ICD-10-CM | POA: Diagnosis not present

## 2016-02-11 LAB — CBC WITH DIFFERENTIAL/PLATELET
Basophils Absolute: 0.1 10*3/uL (ref 0–0.1)
Basophils Relative: 0 %
EOS ABS: 0 10*3/uL (ref 0–0.7)
EOS PCT: 0 %
HCT: 41.7 % (ref 40.0–52.0)
HEMOGLOBIN: 13.9 g/dL (ref 13.0–18.0)
LYMPHS ABS: 2.6 10*3/uL (ref 1.0–3.6)
LYMPHS PCT: 16 %
MCH: 29.5 pg (ref 26.0–34.0)
MCHC: 33.4 g/dL (ref 32.0–36.0)
MCV: 88.3 fL (ref 80.0–100.0)
MONOS PCT: 10 %
Monocytes Absolute: 1.5 10*3/uL — ABNORMAL HIGH (ref 0.2–1.0)
NEUTROS PCT: 74 %
Neutro Abs: 11.7 10*3/uL — ABNORMAL HIGH (ref 1.4–6.5)
Platelets: 225 10*3/uL (ref 150–440)
RBC: 4.72 MIL/uL (ref 4.40–5.90)
RDW: 14.6 % — ABNORMAL HIGH (ref 11.5–14.5)
WBC: 15.9 10*3/uL — ABNORMAL HIGH (ref 3.8–10.6)

## 2016-02-11 LAB — COMPREHENSIVE METABOLIC PANEL
ALK PHOS: 82 U/L (ref 38–126)
ALT: 36 U/L (ref 17–63)
ANION GAP: 7 (ref 5–15)
AST: 21 U/L (ref 15–41)
Albumin: 4.2 g/dL (ref 3.5–5.0)
BILIRUBIN TOTAL: 0.7 mg/dL (ref 0.3–1.2)
BUN: 19 mg/dL (ref 6–20)
CALCIUM: 8.9 mg/dL (ref 8.9–10.3)
CO2: 24 mmol/L (ref 22–32)
CREATININE: 1.12 mg/dL (ref 0.61–1.24)
Chloride: 107 mmol/L (ref 101–111)
Glucose, Bld: 93 mg/dL (ref 65–99)
Potassium: 3.8 mmol/L (ref 3.5–5.1)
SODIUM: 138 mmol/L (ref 135–145)
TOTAL PROTEIN: 7.8 g/dL (ref 6.5–8.1)

## 2016-02-11 MED ORDER — SODIUM CHLORIDE 0.9% FLUSH
10.0000 mL | INTRAVENOUS | Status: AC | PRN
  Administered 2016-02-11: 10 mL via INTRAVENOUS
  Filled 2016-02-11: qty 10

## 2016-02-11 MED ORDER — HEPARIN SOD (PORK) LOCK FLUSH 100 UNIT/ML IV SOLN
500.0000 [IU] | Freq: Once | INTRAVENOUS | Status: AC
  Administered 2016-02-11: 500 [IU] via INTRAVENOUS

## 2016-02-11 MED ORDER — OXYCODONE HCL 5 MG PO TABS: 5.0000 mg | ORAL_TABLET | ORAL | Status: DC | PRN

## 2016-02-11 NOTE — Progress Notes (Signed)
Patient is asking for referral to pain center.  States he was told Dr. Oliva Bustard would no longer fill his pain medication.

## 2016-02-12 LAB — CEA: CEA: 1.8 ng/mL (ref 0.0–4.7)

## 2016-02-15 ENCOUNTER — Encounter: Payer: Self-pay | Admitting: Oncology

## 2016-02-15 NOTE — Progress Notes (Signed)
Entered in error.  [place] @ [facility] [phone]  Fax:([phone] [place]  [name] OB: 1960-10-25  MR#: [id number]  JGG#:[id number]  Patient Care Team: [name], MD as PCP - General (Family Medicine) [name], MD as Consulting Physician (General Surgery)  CHIEF COMPLAINT:  Chief Complaint  Patient presents with  . Colon Cancer   1.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 Specimen integrity: Intact. Macroscopic tumor perforation: Not identified. Invasive tumor: Maximum size: 5.5 cm. Histologic type(s): Invasive adenocarcinoma. Histologic grade and differentiation: G1: well differentiated/low grade  Pathologic Staging: pT4b, pN1b, pM1b Ancillary studies MSI stable K-ras wild-type not mutated by Foundation study.    2.Patient was started on FOLFOX in September of 2016,after 2 cycles of chemotherapy patient developed neuropathy.  Patient had a previous neuropathy as a baseline which increased so was switched over to  FOLFIRI I  IN December of 2016 As patient has clear os wild-type we can proceed to add either a Avastin or cetuximab from the next chemotherapy.(November, 2016)   Avastin was added as abdominal wound has completely healed now Avastin on hold (December, 2016) because of hypertension Chemotherapy was discontinued after protocol 8 cycle because of significant side effect (last chemotherapy was on October 28, 2015) VISIT DIAGNOSIS:     ICD-9-CM ICD-10-CM   1. 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      INTERVAL HISTORY: 55 year old gentleman is somewhat depressed.  Started having increasing pain in  the left lower abdominal area since March of 2016.  P  After discontinuing potassium.  Patient's nausea has improved.  Three-way abdomen did not reveal any evidence of obstruction. Patient also decided to stop taking the lisinopril claiming that was causing nausea and headache. Patient reports significant improvement in nausea and headache.  Here for further follow-up and treatment consideration  Patient is here for ongoing evaluation and treatment consideration.  A repeat CT scan.  Patient is gradually improving.  Nausea is improved.   REVIEW OF SYSTEMS:   Review of system: No status: Patient is having frequent anxiety attacks.  Somewhat depressed.  Feeling weak and tired. HEENT: No soreness in the mouth. GI: Had frequent nausea Compazine did not help so Phenergan was tried.  Abdominal pain on persistent discomfort requiring Percocet.  Already on fentanyl patch. Lower extremity no swelling.  Skin: No rash. Complaints of night sweats. Patient desires strongly to get reversal of colostomy Here for further follow-up and treatment consideration  Neurological system no tingling numbness  Patient had only one episode of diarrhea No significant abdominal crampy pain Patient has a history of sarcoidosis diagnosis in 2014 Was given Lyrica for neuropathy.  But patient has discontinued that because of progressive joint pains Review of systems:  All other systems reviewed and found to be negative.   As per HPI. Otherwise, a complete review of systems is negatve.  PAST MEDICAL HISTORY: Past Medical History  Diagnosis Date  . Sarcoid ([place]) 10/2012  . Neuropathy ([place])   . 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 Besnier-Boeck disease ([place]) 12/21/2012  . Extreme obesity ([place]) 12/21/2012  . LBP (low back pain)  05/28/2014  . Adiposity 03/21/2015  . PONV (postoperative nausea and vomiting)   . Iron deficiency anemia due to chronic blood loss 05/08/2015  . Hypertension     has been  off BP med x 6 months  . Arthritis   . GERD (gastroesophageal reflux disease)   . Abdominal pain     mid abdomen  . History of kidney stones   . History of transfusion   . Colon cancer ([place])   . Skin cancer     "it flairs up with sarcoidosis"    PAST SURGICAL HISTORY: Past Surgical History  Procedure Laterality Date  . Cardiac catheterization    . Flexible sigmoidoscopy N/A 05/07/2015    Procedure: FLEXIBLE SIGMOIDOSCOPY;  Surgeon: Laurence Spates, MD;  Location: [facility];  Service: Endoscopy;  Laterality: N/A;  . Laparoscopic partial colectomy N/A 05/07/2015    Procedure: LAPAROSCOPIC ASSITED PARTIAL COLECTOMY WITH COLOSTOMY, SMALL BOWEL RESECTION, EXCISION OF PERITONEAL NODULE;  Surgeon: [name], MD;  Location: [facility] ORS;  Service: General;  Laterality: N/A;  . Portacath placement Right 05/27/2015    Procedure: INSERTION PORT-A-CATH;  Surgeon: [name], MD;  Location: [facility] OR[facility];  Service: General;  Laterality: Right;    FAMILY HISTORY Family History  Problem Relation Age of Onset  . Arthritis Mother   . Heart disease Mother   . Stroke Father         ADVANCED DIRECTIVES:  Patient does not have any living will or healthcare power of attorney.  Information was given .  Available resources had been discussed.  We will follow-up on subsequent appointments regarding this issue  HEALTH MAINTENANCE: Social History  Substance Use Topics  . Smoking status: Passive Smoke Exposure - Never Smoker  . Smokeless tobacco: Never Used  . Alcohol Use: No      Allergies  Allergen Reactions  . Fentanyl Nausea And Vomiting    IV med  . Versed [Midazolam] Nausea And Vomiting  . Gabapentin     Swallowing problems  . Paba Derivatives Nausea And Vomiting    Pt states he is allergic to unknown anesthesia. Pt has nausea and vomiting with anesthesia.     Current Outpatient Prescriptions  Medication Sig Dispense Refill  . AMBIEN 10 MG tablet Take 10 mg by mouth at bedtime as  needed for sleep.   4  . amLODipine (NORVASC) 10 MG tablet Take 1 tablet (10 mg total) by mouth daily. 30 tablet 3  . fentaNYL (DURAGESIC - DOSED MCG/HR) 25 MCG/HR patch Place 1 patch (25 mcg total) onto the skin every 3 (three) days. 10 patch 0  . lidocaine-prilocaine (EMLA) cream Apply 1 application topically as needed. 30 g 3  . meloxicam (MOBIC) 15 MG tablet Take by mouth.    . oxyCODONE (OXY IR/ROXICODONE) 5 MG immediate release tablet Take 1 tablet (5 mg total) by mouth every 4 (four) hours as needed for severe pain. 50 tablet 0  . prochlorperazine (COMPAZINE) 10 MG tablet TAKE 1 TABLET BY MOUTH EVERY 8 HOURS AS NEEDED FOR NAUSEA OR VOMITING 30 tablet 1  . promethazine (PHENERGAN) 25 MG tablet Take 1 tablet (25 mg total) by mouth every 6 (six) hours as needed for nausea or vomiting. 30 tablet 0   No current facility-administered medications for this visit.   Facility-Administered Medications Ordered in Other Visits  Medication Dose Route Frequency Provider Last Rate Last Dose  . sodium chloride 0.9 % injection 10 mL  10 mL Intracatheter PRN Forest Gleason, MD  10 mL at 08/02/15 0955  . sodium chloride flush (NS) 0.9 % injection 10 mL  10 mL Intravenous PRN Forest Gleason, MD   10 mL at 02/11/16 1326    OBJECTIVE: PHYSICAL EXAM: GENERAL:  Well developed, well nourished, Patient is in somewhat pain and in distress. MENTAL STATUS:  Alert and oriented to person, place and time.   RESPIRATORY:  Clear to auscultation without rales, wheezes or rhonchi. CARDIOVASCULAR:  Regular rate and rhythm without murmur, rub or gallop.  ABDOMEN:  Soft, non-tender, with active bowel sounds, and no hepatosplenomegaly.  No masses. Wound is still draining. Patient is putting Neosporin Colostomy functioning well, and with liquid stool BACK:  No CVA tenderness.  No tenderness on percussion of the back or rib cage. SKIN:  No rashes, ulcers or lesions. EXTREMITIES: No edema, no skin discoloration or  tenderness.  No palpable cords. LYMPH NODES: No palpable cervical, supraclavicular, axillary or inguinal adenopathy  NEUROLOGICAL: Unremarkable. PSYCH:  depression  Filed Vitals:   02/11/16 1407  BP: 153/84  Pulse: 72  Temp: 96.2 F (35.7 C)  Resp: 18     Body mass index is 40.08 kg/(m^2).    ECOG FS:1 - Symptomatic but completely ambulatory  LAB RESULTS:  Clinical Support on 02/11/2016  Component Date Value Ref Range Status  . WBC 02/11/2016 15.9* 3.8 - 10.6 K/uL Final  . RBC 02/11/2016 4.72  4.40 - 5.90 MIL/uL Final  . Hemoglobin 02/11/2016 13.9  13.0 - 18.0 g/dL Final  . HCT 02/11/2016 41.7  40.0 - 52.0 % Final  . MCV 02/11/2016 88.3  80.0 - 100.0 fL Final  . MCH 02/11/2016 29.5  26.0 - 34.0 pg Final  . MCHC 02/11/2016 33.4  32.0 - 36.0 g/dL Final  . RDW 02/11/2016 14.6* 11.5 - 14.5 % Final  . Platelets 02/11/2016 225  150 - 440 K/uL Final  . Neutrophils Relative % 02/11/2016 74   Final  . Neutro Abs 02/11/2016 11.7* 1.4 - 6.5 K/uL Final  . Lymphocytes Relative 02/11/2016 16   Final  . Lymphs Abs 02/11/2016 2.6  1.0 - 3.6 K/uL Final  . Monocytes Relative 02/11/2016 10   Final  . Monocytes Absolute 02/11/2016 1.5* 0.2 - 1.0 K/uL Final  . Eosinophils Relative 02/11/2016 0   Final  . Eosinophils Absolute 02/11/2016 0.0  0 - 0.7 K/uL Final  . Basophils Relative 02/11/2016 0   Final  . Basophils Absolute 02/11/2016 0.1  0 - 0.1 K/uL Final  . Sodium 02/11/2016 138  135 - 145 mmol/L Final  . Potassium 02/11/2016 3.8  3.5 - 5.1 mmol/L Final  . Chloride 02/11/2016 107  101 - 111 mmol/L Final  . CO2 02/11/2016 24  22 - 32 mmol/L Final  . Glucose, Bld 02/11/2016 93  65 - 99 mg/dL Final  . BUN 02/11/2016 19  6 - 20 mg/dL Final  . Creatinine, Ser 02/11/2016 1.12  0.61 - 1.24 mg/dL Final  . Calcium 02/11/2016 8.9  8.9 - 10.3 mg/dL Final  . Total Protein 02/11/2016 7.8  6.5 - 8.1 g/dL Final  . Albumin 02/11/2016 4.2  3.5 - 5.0 g/dL Final  . AST 02/11/2016 21  15 - 41 U/L Final  .  ALT 02/11/2016 36  17 - 63 U/L Final  . Alkaline Phosphatase 02/11/2016 82  38 - 126 U/L Final  . Total Bilirubin 02/11/2016 0.7  0.3 - 1.2 mg/dL Final  . GFR calc non Af Amer 02/11/2016 >60  >60 mL/min Final  .  GFR calc Af Amer 02/11/2016 >60  >60 mL/min Final   Comment: (NOTE) The eGFR has been calculated using the CKD EPI equation. This calculation has not been validated in all clinical situations. eGFR's persistently <60 mL/min signify possible Chronic Kidney Disease.   . Anion gap 02/11/2016 7  5 - 15 Final  . CEA 02/11/2016 1.8  0.0 - 4.7 ng/mL Final   Comment: (NOTE)       Roche ECLIA methodology       Nonsmokers  <3.9                                     Smokers     <5.6 Performed At: [facility] [place], [place] [id number] Lindon Romp MD PH:[phone]      ASSESSMENT:  Carcinoma of distal descending colon T4 b(small bowel involvement) N1 B (2 of 15 lymph nodes positive)   M1 B2 tumor(  Peritoneal nodules) 2.sarcoidosis with involvement of mediastinal lymph node and axillary lymph node by history. All lab data has been reviewed.  CEA is within normal limits.  Patient's insurance did not approve CT scan of chest as well as PET scan so CT scan of abdomen and pelvis was done which did not reveal any evidence of recurrent or metastatic disease.   2, sinus chronic pain which may not have been related with malignancy or chemotherapy and was advised to decrease fentanyl patch to 25 g.  The pain persistent pain clinic appointment can be made. 3.  DiscussED   result of the CT scan I will discuss that situation with patient's surgeon from [facility] to decide on further surgical intervention regarding reversal of colostomy. 4.  Patient would be seen by me in this 3 months for reevaluation Tumor marker with CEA remains stable.  I would discuss situation with his surgeon regarding reversing colostomy. Regarding pain control at present time pain is  not related to any malignancy.  She has been referred to pain clinic for chronic pain syndrome management.  Discussion regarding CT scan results.  As well as possibility of reversal of colostomy and gradually decreasing fentanyl patch which has been now decreased to 25 g every 3 days  We discussed situation with surgeon regarding reversal of colostomy Patient expressed understanding and was in agreement with this plan. He also understands that He can call clinic at any time with any questions, concerns, or complaints.    No matching staging information was found for the patient.  Forest Gleason, MD   02/15/2016 9:46 AM

## 2016-02-17 ENCOUNTER — Telehealth: Payer: Self-pay | Admitting: *Deleted

## 2016-02-17 NOTE — Telephone Encounter (Signed)
Notified pt that Dr. Oliva Bustard has spoken to Dr. Zella Richer. Instructed pt to contact Dr. Bertrum Sol office to schedule an appt.

## 2016-02-18 ENCOUNTER — Other Ambulatory Visit: Payer: BLUE CROSS/BLUE SHIELD

## 2016-02-18 ENCOUNTER — Ambulatory Visit: Payer: BLUE CROSS/BLUE SHIELD | Admitting: Oncology

## 2016-02-23 ENCOUNTER — Telehealth: Payer: Self-pay | Admitting: *Deleted

## 2016-02-23 NOTE — Telephone Encounter (Signed)
Asking about referral to be made to pain clinic and wants results of tumor markers. I called the Pain clinic to inquire as to when an appt will be scheduled and they said they will call me back. I gave her the results of his CEA 1.8 and let her know it is in the normal range

## 2016-02-27 ENCOUNTER — Other Ambulatory Visit: Payer: Self-pay | Admitting: *Deleted

## 2016-02-27 DIAGNOSIS — C186 Malignant neoplasm of descending colon: Secondary | ICD-10-CM

## 2016-02-27 MED ORDER — OXYCODONE HCL 5 MG PO TABS: 5.0000 mg | ORAL_TABLET | ORAL | Status: DC | PRN

## 2016-03-03 ENCOUNTER — Telehealth: Payer: Self-pay | Admitting: *Deleted

## 2016-03-03 DIAGNOSIS — C186 Malignant neoplasm of descending colon: Secondary | ICD-10-CM

## 2016-03-03 MED ORDER — FENTANYL 25 MCG/HR TD PT72: 25.0000 ug | MEDICATED_PATCH | TRANSDERMAL | Status: DC

## 2016-03-03 NOTE — Telephone Encounter (Signed)
I have called the pain clinic again and was told Dr Andree Elk has his information and as soon as they hear from him, they will call and schedule patient. Roxanne informed of this

## 2016-03-18 ENCOUNTER — Telehealth: Payer: Self-pay | Admitting: *Deleted

## 2016-03-18 ENCOUNTER — Telehealth: Payer: Self-pay

## 2016-03-18 ENCOUNTER — Inpatient Hospital Stay: Payer: BLUE CROSS/BLUE SHIELD

## 2016-03-18 ENCOUNTER — Other Ambulatory Visit: Payer: Self-pay | Admitting: *Deleted

## 2016-03-18 ENCOUNTER — Inpatient Hospital Stay (HOSPITAL_BASED_OUTPATIENT_CLINIC_OR_DEPARTMENT_OTHER): Payer: BLUE CROSS/BLUE SHIELD | Admitting: Family Medicine

## 2016-03-18 ENCOUNTER — Inpatient Hospital Stay: Payer: BLUE CROSS/BLUE SHIELD | Attending: Internal Medicine

## 2016-03-18 VITALS — BP 144/90 | HR 70 | Temp 98.0°F | Ht 71.0 in | Wt 283.6 lb

## 2016-03-18 DIAGNOSIS — C186 Malignant neoplasm of descending colon: Secondary | ICD-10-CM

## 2016-03-18 DIAGNOSIS — G629 Polyneuropathy, unspecified: Secondary | ICD-10-CM | POA: Diagnosis not present

## 2016-03-18 DIAGNOSIS — E669 Obesity, unspecified: Secondary | ICD-10-CM | POA: Insufficient documentation

## 2016-03-18 DIAGNOSIS — Z85828 Personal history of other malignant neoplasm of skin: Secondary | ICD-10-CM | POA: Insufficient documentation

## 2016-03-18 DIAGNOSIS — R531 Weakness: Secondary | ICD-10-CM

## 2016-03-18 DIAGNOSIS — Z79899 Other long term (current) drug therapy: Secondary | ICD-10-CM | POA: Insufficient documentation

## 2016-03-18 DIAGNOSIS — Z95828 Presence of other vascular implants and grafts: Secondary | ICD-10-CM

## 2016-03-18 DIAGNOSIS — Z933 Colostomy status: Secondary | ICD-10-CM

## 2016-03-18 DIAGNOSIS — Z9049 Acquired absence of other specified parts of digestive tract: Secondary | ICD-10-CM | POA: Diagnosis not present

## 2016-03-18 DIAGNOSIS — Z949 Transplanted organ and tissue status, unspecified: Secondary | ICD-10-CM

## 2016-03-18 DIAGNOSIS — I10 Essential (primary) hypertension: Secondary | ICD-10-CM | POA: Insufficient documentation

## 2016-03-18 DIAGNOSIS — D869 Sarcoidosis, unspecified: Secondary | ICD-10-CM

## 2016-03-18 DIAGNOSIS — K219 Gastro-esophageal reflux disease without esophagitis: Secondary | ICD-10-CM | POA: Diagnosis not present

## 2016-03-18 DIAGNOSIS — Z6839 Body mass index (BMI) 39.0-39.9, adult: Secondary | ICD-10-CM | POA: Insufficient documentation

## 2016-03-18 DIAGNOSIS — Z87442 Personal history of urinary calculi: Secondary | ICD-10-CM | POA: Diagnosis not present

## 2016-03-18 DIAGNOSIS — M1990 Unspecified osteoarthritis, unspecified site: Secondary | ICD-10-CM | POA: Insufficient documentation

## 2016-03-18 DIAGNOSIS — Z7722 Contact with and (suspected) exposure to environmental tobacco smoke (acute) (chronic): Secondary | ICD-10-CM | POA: Diagnosis not present

## 2016-03-18 LAB — COMPREHENSIVE METABOLIC PANEL
ALBUMIN: 3.9 g/dL (ref 3.5–5.0)
ALK PHOS: 77 U/L (ref 38–126)
ALT: 24 U/L (ref 17–63)
ANION GAP: 7 (ref 5–15)
AST: 19 U/L (ref 15–41)
BUN: 10 mg/dL (ref 6–20)
CALCIUM: 8.9 mg/dL (ref 8.9–10.3)
CHLORIDE: 108 mmol/L (ref 101–111)
CO2: 26 mmol/L (ref 22–32)
Creatinine, Ser: 0.93 mg/dL (ref 0.61–1.24)
GFR calc non Af Amer: >60 mL/min (ref >60)
Glucose, Bld: 82 mg/dL (ref 65–99)
POTASSIUM: 3.8 mmol/L (ref 3.5–5.1)
SODIUM: 141 mmol/L (ref 135–145)
Total Bilirubin: 0.5 mg/dL (ref 0.3–1.2)
Total Protein: 7.1 g/dL (ref 6.5–8.1)

## 2016-03-18 LAB — CBC WITH DIFFERENTIAL/PLATELET
BASOS PCT: 1 %
Basophils Absolute: 0 10*3/uL (ref 0–0.1)
Eosinophils Absolute: 0.1 10*3/uL (ref 0–0.7)
Eosinophils Relative: 1 %
HEMATOCRIT: 40.3 % (ref 40.0–52.0)
Hemoglobin: 13.6 g/dL (ref 13.0–18.0)
LYMPHS ABS: 2.4 10*3/uL (ref 1.0–3.6)
Lymphocytes Relative: 24 %
MCH: 30 pg (ref 26.0–34.0)
MCHC: 33.8 g/dL (ref 32.0–36.0)
MCV: 88.6 fL (ref 80.0–100.0)
MONO ABS: 0.8 10*3/uL (ref 0.2–1.0)
MONOS PCT: 8 %
NEUTROS ABS: 6.6 10*3/uL — AB (ref 1.4–6.5)
Neutrophils Relative %: 66 %
PLATELETS: 219 10*3/uL (ref 150–440)
RBC: 4.55 MIL/uL (ref 4.40–5.90)
RDW: 14.3 % (ref 11.5–14.5)
WBC: 9.9 10*3/uL (ref 3.8–10.6)

## 2016-03-18 MED ORDER — PROMETHAZINE HCL 25 MG PO TABS: 25.0000 mg | ORAL_TABLET | Freq: Four times a day (QID) | ORAL | Status: DC | PRN

## 2016-03-18 MED ORDER — SODIUM CHLORIDE 0.9% FLUSH
10.0000 mL | INTRAVENOUS | Status: DC | PRN
  Administered 2016-03-18: 10 mL via INTRAVENOUS
  Filled 2016-03-18: qty 10

## 2016-03-18 MED ORDER — HEPARIN SOD (PORK) LOCK FLUSH 100 UNIT/ML IV SOLN
500.0000 [IU] | Freq: Once | INTRAVENOUS | Status: AC
  Administered 2016-03-18: 500 [IU] via INTRAVENOUS

## 2016-03-18 NOTE — Telephone Encounter (Signed)
-----   Message from Shawnee Knapp, RN sent at 03/18/2016 10:11 AM EDT ----- Regarding: LABS FOR PATIENT? Patient is coming today for port flush/survivorship.  Wife called and states he has been very sick since the weekend: Nauseated/vomitting Clammy Pain Dizzy Do you want labs with port flush?  See patient while he is here?

## 2016-03-18 NOTE — Progress Notes (Signed)
Patient here for sick visit. Patient states he has been having stomach pains and nausea , after eating patient feels light headed and clammy.

## 2016-03-18 NOTE — Progress Notes (Signed)
[place]  [phone]  [phone]     [name] DOB: 11/10/1960  MR#: [id number]  BOF#:[id number]  Patient Care Team: [name], MD as PCP - General (Family Medicine) [name], MD as Consulting Physician (General Surgery)  CHIEF COMPLAINT:  Chief Complaint  Patient presents with  . Colon Cancer  . sick visit    INTERVAL HISTORY:  Patient is here for routine survivorship visit but requested to be seen as an acute add on due to increasing weakness, fatigue, nausea, episodes of diaphoresis, abdominal pains, increasing and worsening myalgias and joint pains. Patient has progressively worsened over the last several weeks. 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 Patient was previously seen by a neurologist at [facility] clinic that helped with management of his sarcoidosis as well as neuropathies. He is currently not taking any medication for his neuropathy. He is using oxycodone as needed and has a fentanyl 25 g patch in place. Patient reports having had to use increasing amounts of Phenergan over the last 2 weeks. He states that his appetite is very poor and he has only had a piece of toast for breakfast this morning.  REVIEW OF SYSTEMS:   Review of Systems  Constitutional: Positive for malaise/fatigue and diaphoresis. Negative for fever, chills and weight loss.  Eyes: Negative.   Respiratory: Negative for cough, hemoptysis, sputum production, shortness of breath and wheezing.   Cardiovascular: Negative for chest pain, palpitations, orthopnea, claudication, leg swelling and PND.  Gastrointestinal: Positive for nausea and abdominal pain. Negative for heartburn, vomiting, diarrhea, constipation, blood in stool and melena.       Change in stool color, slightly darker  Genitourinary: Negative.   Musculoskeletal: Positive for myalgias, back pain, joint pain and neck pain.  Skin: Negative.     Neurological: Positive for dizziness, tingling, focal weakness and weakness. Negative for seizures.  Endo/Heme/Allergies: Does not bruise/bleed easily.  Psychiatric/Behavioral: Negative for depression. The patient is nervous/anxious. The patient does not have insomnia.   All other systems reviewed and are negative.   As per HPI. Otherwise, a complete review of systems is negatve.  ONCOLOGY HISTORY: Oncology History   1.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 Specimen integrity: Intact. Macroscopic tumor perforation: Not identified. Invasive tumor: Maximum size: 5.5 cm. Histologic type(s): Invasive adenocarcinoma. Histologic grade and differentiation: G1: well differentiated/low grade  Pathologic Staging: pT4b, pN1b, pM1b Ancillary studies MSI stable K-ras wild-type not mutated by Foundation study. 2.Patient was started on FOLFOX in September of 2016,after 2 cycles of chemotherapy patient developed neuropathy. Patient had a previous neuropathy as a baseline which increased so was switched over to FOLFIRI I IN December of 2016 As patient has clear os wild-type we can proceed to add either a Avastin or cetuximab from the next chemotherapy.(November, 2016)  Avastin was added as abdominal wound has completely healed now Avastin on hold (December, 2016) because of hypertension Chemotherapy was discontinued after protocol 8 cycle because of significant side effect (last chemotherapy was on October 28, 2015)     Cancer of descending colon w obstruction s/p colectomy/ostomy 05/07/2015   05/10/2015 Initial Diagnosis Cancer of descending colon w obstruction s/p colectomy/ostomy 05/07/2015    PAST MEDICAL HISTORY: Past Medical History  Diagnosis Date  . Sarcoid ([place]) 10/2012  . Neuropathy ([place])   . Disorder of peripheral nervous system ([place]) 05/28/2014  . Besnier-Boeck  disease ([place]) 12/21/2012   . Extreme obesity ([place]) 12/21/2012  . LBP (low back pain) 05/28/2014  . Adiposity 03/21/2015  . PONV (postoperative nausea and vomiting)   . Iron deficiency anemia due to chronic blood loss 05/08/2015  . Hypertension     has been off BP med x 6 months  . Arthritis   . GERD (gastroesophageal reflux disease)   . Abdominal pain     mid abdomen  . History of kidney stones   . History of transfusion   . Colon cancer ([place])   . Skin cancer     "it flairs up with sarcoidosis"    PAST SURGICAL HISTORY: Past Surgical History  Procedure Laterality Date  . Cardiac catheterization    . Flexible sigmoidoscopy N/A 05/07/2015    Procedure: FLEXIBLE SIGMOIDOSCOPY;  Surgeon: Laurence Spates, MD;  Location: [facility];  Service: Endoscopy;  Laterality: N/A;  . Laparoscopic partial colectomy N/A 05/07/2015    Procedure: LAPAROSCOPIC ASSITED PARTIAL COLECTOMY WITH COLOSTOMY, SMALL BOWEL RESECTION, EXCISION OF PERITONEAL NODULE;  Surgeon: [name], MD;  Location: [facility];  Service: General;  Laterality: N/A;  . Portacath placement Right 05/27/2015    Procedure: INSERTION PORT-A-CATH;  Surgeon: [name], MD;  Location: [facility];  Service: General;  Laterality: Right;    FAMILY HISTORY Family History  Problem Relation Age of Onset  . Arthritis Mother   . Heart disease Mother   . Stroke Father     GYNECOLOGIC HISTORY:  No LMP for male patient.     ADVANCED DIRECTIVES:    HEALTH MAINTENANCE: Social History  Substance Use Topics  . Smoking status: Passive Smoke Exposure - Never Smoker  . Smokeless tobacco: Never Used  . Alcohol Use: No      Allergies  Allergen Reactions  . Versed [Midazolam] Nausea And Vomiting  . Gabapentin     Swallowing problems  . Paba Derivatives Nausea And Vomiting    Pt states he is allergic to unknown anesthesia. Pt has nausea and vomiting with anesthesia.     Current Outpatient Prescriptions  Medication Sig Dispense Refill  . AMBIEN 10 MG tablet  Take 10 mg by mouth at bedtime as needed for sleep.   4  . fentaNYL (DURAGESIC - DOSED MCG/HR) 25 MCG/HR patch Place 1 patch (25 mcg total) onto the skin every 3 (three) days. 10 patch 0  . oxyCODONE (OXY IR/ROXICODONE) 5 MG immediate release tablet Take 1 tablet (5 mg total) by mouth every 4 (four) hours as needed for severe pain. 50 tablet 0  . prochlorperazine (COMPAZINE) 10 MG tablet TAKE 1 TABLET BY MOUTH EVERY 8 HOURS AS NEEDED FOR NAUSEA OR VOMITING 30 tablet 1  . promethazine (PHENERGAN) 25 MG tablet Take 1 tablet (25 mg total) by mouth every 6 (six) hours as needed for nausea or vomiting. 30 tablet 0  . amLODipine (NORVASC) 10 MG tablet Take 1 tablet (10 mg total) by mouth daily. (Patient not taking: Reported on 03/18/2016) 30 tablet 3  . lidocaine-prilocaine (EMLA) cream Apply 1 application topically as needed. 30 g 3  . meloxicam (MOBIC) 15 MG tablet Take by mouth. Reported on 03/18/2016     No current facility-administered medications for this visit.   Facility-Administered Medications Ordered in Other Visits  Medication Dose Route Frequency Provider Last Rate Last Dose  . sodium chloride 0.9 % injection 10 mL  10 mL Intracatheter PRN Forest Gleason, MD   10 mL at 08/02/15 0955  . sodium chloride flush (NS)  0.9 % injection 10 mL  10 mL Intravenous PRN Forest Gleason, MD   10 mL at 02/11/16 1326    OBJECTIVE: BP 144/90 mmHg  Pulse 70  Temp(Src) 98 F (36.7 C) (Tympanic)  Ht 5' 11"  (1.803 m)  Wt 283 lb 9.6 oz (128.64 kg)  BMI 39.57 kg/m2   Body mass index is 39.57 kg/(m^2).    ECOG FS:1 - Symptomatic but completely ambulatory  General: Well-developed, well-nourished, no acute distress. Eyes: Pink conjunctiva, anicteric sclera. HEENT: Normocephalic, moist mucous membranes, clear oropharnyx. Lungs: Clear to auscultation bilaterally. Heart: Regular rate and rhythm. No rubs, murmurs, or gallops. Abdomen: Soft, nontender, nondistended. No organomegaly noted, normoactive bowel sounds.  Colostomy in place and functioning well. Musculoskeletal: No edema, cyanosis, or clubbing. Neuro: Alert, answering all questions appropriately. Cranial nerves grossly intact. Skin: No rashes or petechiae noted. Psych: Flat affect, depressed.   LAB RESULTS:  Appointment on 03/18/2016  Component Date Value Ref Range Status  . WBC 03/18/2016 9.9  3.8 - 10.6 K/uL Final  . RBC 03/18/2016 4.55  4.40 - 5.90 MIL/uL Final  . Hemoglobin 03/18/2016 13.6  13.0 - 18.0 g/dL Final  . HCT 03/18/2016 40.3  40.0 - 52.0 % Final  . MCV 03/18/2016 88.6  80.0 - 100.0 fL Final  . MCH 03/18/2016 30.0  26.0 - 34.0 pg Final  . MCHC 03/18/2016 33.8  32.0 - 36.0 g/dL Final  . RDW 03/18/2016 14.3  11.5 - 14.5 % Final  . Platelets 03/18/2016 219  150 - 440 K/uL Final  . Neutrophils Relative % 03/18/2016 66   Final  . Neutro Abs 03/18/2016 6.6* 1.4 - 6.5 K/uL Final  . Lymphocytes Relative 03/18/2016 24   Final  . Lymphs Abs 03/18/2016 2.4  1.0 - 3.6 K/uL Final  . Monocytes Relative 03/18/2016 8   Final  . Monocytes Absolute 03/18/2016 0.8  0.2 - 1.0 K/uL Final  . Eosinophils Relative 03/18/2016 1   Final  . Eosinophils Absolute 03/18/2016 0.1  0 - 0.7 K/uL Final  . Basophils Relative 03/18/2016 1   Final  . Basophils Absolute 03/18/2016 0.0  0 - 0.1 K/uL Final  . Sodium 03/18/2016 141  135 - 145 mmol/L Final  . Potassium 03/18/2016 3.8  3.5 - 5.1 mmol/L Final  . Chloride 03/18/2016 108  101 - 111 mmol/L Final  . CO2 03/18/2016 26  22 - 32 mmol/L Final  . Glucose, Bld 03/18/2016 82  65 - 99 mg/dL Final  . BUN 03/18/2016 10  6 - 20 mg/dL Final  . Creatinine, Ser 03/18/2016 0.93  0.61 - 1.24 mg/dL Final  . Calcium 03/18/2016 8.9  8.9 - 10.3 mg/dL Final  . Total Protein 03/18/2016 7.1  6.5 - 8.1 g/dL Final  . Albumin 03/18/2016 3.9  3.5 - 5.0 g/dL Final  . AST 03/18/2016 19  15 - 41 U/L Final  . ALT 03/18/2016 24  17 - 63 U/L Final  . Alkaline Phosphatase 03/18/2016 77  38 - 126 U/L Final  . Total Bilirubin  03/18/2016 0.5  0.3 - 1.2 mg/dL Final  . GFR calc non Af Amer 03/18/2016 >60  >60 mL/min Final  . GFR calc Af Amer 03/18/2016 >60  >60 mL/min Final   Comment: (NOTE) The eGFR has been calculated using the CKD EPI equation. This calculation has not been validated in all clinical situations. eGFR's persistently <60 mL/min signify possible Chronic Kidney Disease.   . Anion gap 03/18/2016 7  5 - 15 Final  .  CEA 03/18/2016 2.6  0.0 - 4.7 ng/mL Final   Comment: (NOTE)       Roche ECLIA methodology       Nonsmokers  <3.9                                     Smokers     <5.6 Performed At: [facility] [place], [place] [id number] Lindon Romp MD UR:[id number]     STUDIES: No results found.  ASSESSMENT:  Carcinoma of the distal descending colon, T4b, N1b, M1b, stage IVB with peritoneal nodules. Sarcoidosis. Chronic neuropathies.  PLAN:   1. Carcinoma of colon. Patient's complaints today are nonspecific and appeared to be chronic in nature. He has expressed his concerns and several different appointments with previous oncologist. Pain management appears to be an issue with the patient. He is currently on fentanyl 25 g by transdermal patch and using oxycodone as needed for breakthrough pain. He has been referred to pain clinic but he has never received a phone call from the clinic for an appointment. He states this is been ongoing for approximately a month. We will contact pain management clinic in order to establish an appointment for patient. Pain does not appear to be related to malignancy. Also advised patient that he needs to see his GI doctor in [place] regarding his nausea, abdominal pains. There is concern for any number of gastrointestinal issues as patient has reported no change in the color of his stool, decrease in appetite, increase in abdominal discomfort and pain. Also discussed at length with patient that he needs to be in touch with his neurologist  history he reports is in [place] regarding his neuropathic pain and also his sarcoidosis. Patient states that his neurologist previously treated his sarcoidosis as well.  An appointment has already been made to follow up with [name] on 05/13/2016. Patient was encouraged to meet with GI as well as neurology specialists prior to this follow-up appointment with the hope that medications or changes in treatment plans have been made.   Patient expressed understanding and was in agreement with this plan. He also understands that He can call clinic at any time with any questions, concerns, or complaints.   [name] was available for consultation and review of plan of care for this patient.  Cancer of descending colon w obstruction s/p colectomy/ostomy 05/07/2015   Staging form: Colon and Rectum, AJCC 7th Edition     Clinical: Stage IVB (T4b, N1, M1b) - Signed by Forest Gleason, MD on 05/20/2015     Pathologic: No stage assigned - Forbes Cellar, NP   03/19/2016 9:03 AM

## 2016-03-18 NOTE — Progress Notes (Signed)
Survivorship Care Plan visit completed.  Treatment summary reviewed and given to patient.  ASCO booklet reviewed and given to patient.  CARE program and Cancer Transitions discussed along with other resources provided by the cancer center.  Patient verbalized understanding.  

## 2016-03-18 NOTE — Telephone Encounter (Signed)
Added patient to lab/and NP schedule today per v/o [name]. Attempted to contact patient. Left vm on cell phone. Unable to leave msg on home phone number. (cbc, metc, cea)

## 2016-03-18 NOTE — Telephone Encounter (Signed)
Patient's wife stated that he had an appointment for a port flush but was not feeling well and would like to see Dr.  Renita Papa, RN stated that she had already called patient and information was given to patient again.  Patient to have lab work done while he was getting a port flush and was going to see Georgeanne Nim, NP regarding symptoms

## 2016-03-19 ENCOUNTER — Other Ambulatory Visit: Payer: Self-pay | Admitting: Family Medicine

## 2016-03-19 DIAGNOSIS — C186 Malignant neoplasm of descending colon: Secondary | ICD-10-CM

## 2016-03-19 LAB — CEA: CEA: 2.6 ng/mL (ref 0.0–4.7)

## 2016-03-19 MED ORDER — OXYCODONE HCL 5 MG PO TABS: 5.0000 mg | ORAL_TABLET | ORAL | Status: DC | PRN

## 2016-03-22 ENCOUNTER — Telehealth: Payer: Self-pay

## 2016-03-22 ENCOUNTER — Encounter (HOSPITAL_COMMUNITY): Payer: Self-pay | Admitting: Emergency Medicine

## 2016-03-22 ENCOUNTER — Emergency Department (HOSPITAL_COMMUNITY): Payer: BLUE CROSS/BLUE SHIELD

## 2016-03-22 ENCOUNTER — Emergency Department (HOSPITAL_COMMUNITY)
Admission: EM | Admit: 2016-03-22 | Discharge: 2016-03-23 | Disposition: A | Discharge status: Home / Self Care | Payer: BLUE CROSS/BLUE SHIELD | Attending: Emergency Medicine | Admitting: Emergency Medicine

## 2016-03-22 DIAGNOSIS — N201 Calculus of ureter: Secondary | ICD-10-CM | POA: Diagnosis not present

## 2016-03-22 DIAGNOSIS — I10 Essential (primary) hypertension: Secondary | ICD-10-CM | POA: Insufficient documentation

## 2016-03-22 DIAGNOSIS — Z85828 Personal history of other malignant neoplasm of skin: Secondary | ICD-10-CM | POA: Insufficient documentation

## 2016-03-22 DIAGNOSIS — M1990 Unspecified osteoarthritis, unspecified site: Secondary | ICD-10-CM | POA: Diagnosis not present

## 2016-03-22 DIAGNOSIS — Z7722 Contact with and (suspected) exposure to environmental tobacco smoke (acute) (chronic): Secondary | ICD-10-CM | POA: Insufficient documentation

## 2016-03-22 DIAGNOSIS — R1032 Left lower quadrant pain: Secondary | ICD-10-CM | POA: Diagnosis present

## 2016-03-22 DIAGNOSIS — Z85038 Personal history of other malignant neoplasm of large intestine: Secondary | ICD-10-CM | POA: Insufficient documentation

## 2016-03-22 DIAGNOSIS — C186 Malignant neoplasm of descending colon: Secondary | ICD-10-CM

## 2016-03-22 LAB — COMPREHENSIVE METABOLIC PANEL
ALBUMIN: 4 g/dL (ref 3.5–5.0)
ALK PHOS: 73 U/L (ref 38–126)
ALT: 24 U/L (ref 17–63)
ANION GAP: 7 (ref 5–15)
AST: 20 U/L (ref 15–41)
BILIRUBIN TOTAL: 0.5 mg/dL (ref 0.3–1.2)
BUN: 11 mg/dL (ref 6–20)
CALCIUM: 9 mg/dL (ref 8.9–10.3)
CO2: 24 mmol/L (ref 22–32)
CREATININE: 0.78 mg/dL (ref 0.61–1.24)
Chloride: 109 mmol/L (ref 101–111)
GFR calc Af Amer: >60 mL/min (ref >60)
GFR calc non Af Amer: >60 mL/min (ref >60)
GLUCOSE: 106 mg/dL — AB (ref 65–99)
Potassium: 3.9 mmol/L (ref 3.5–5.1)
Sodium: 140 mmol/L (ref 135–145)
TOTAL PROTEIN: 7.2 g/dL (ref 6.5–8.1)

## 2016-03-22 LAB — URINE MICROSCOPIC-ADD ON: Bacteria, UA: NONE SEEN

## 2016-03-22 LAB — URINALYSIS, ROUTINE W REFLEX MICROSCOPIC
Bilirubin Urine: NEGATIVE
Glucose, UA: NEGATIVE mg/dL
KETONES UR: NEGATIVE mg/dL
NITRITE: NEGATIVE
Protein, ur: 100 mg/dL — AB
Specific Gravity, Urine: 1.018 (ref 1.005–1.030)
pH: 6 (ref 5.0–8.0)

## 2016-03-22 LAB — CBC
HCT: 40.1 % (ref 39.0–52.0)
Hemoglobin: 13.3 g/dL (ref 13.0–17.0)
MCH: 29.5 pg (ref 26.0–34.0)
MCHC: 33.2 g/dL (ref 30.0–36.0)
MCV: 88.9 fL (ref 78.0–100.0)
PLATELETS: 211 10*3/uL (ref 150–400)
RBC: 4.51 MIL/uL (ref 4.22–5.81)
RDW: 13.9 % (ref 11.5–15.5)
WBC: 12.9 10*3/uL — ABNORMAL HIGH (ref 4.0–10.5)

## 2016-03-22 LAB — LIPASE, BLOOD: Lipase: 16 U/L (ref 11–51)

## 2016-03-22 MED ORDER — MORPHINE SULFATE (PF) 4 MG/ML IV SOLN
4.0000 mg | Freq: Once | INTRAVENOUS | Status: AC
  Administered 2016-03-22: 4 mg via INTRAVENOUS
  Filled 2016-03-22: qty 1

## 2016-03-22 NOTE — Telephone Encounter (Signed)
Notified Patient's wife that oxycodone prescription refill is ready for pickup at front desk.

## 2016-03-22 NOTE — ED Notes (Signed)
Pt requesting IV be taken out of his arm; states it is too painful with his neuropathy; will attempt to access port

## 2016-03-22 NOTE — ED Provider Notes (Signed)
CSN: [id number]     Arrival date & time 03/22/16  1909 History   First MD Initiated Contact with Patient 03/22/16 2057     Chief Complaint  Patient presents with  . Abdominal Pain     (Consider location/radiation/quality/duration/timing/severity/associated sxs/prior Treatment) HPI Comments: 55 year old male with past medical history including colon cancer, sarcoidosis, peripheral neuropathy, kidney stones, chronic pain who presents with abdominal pain. The patient has a chronic history of intermittent left-sided abdominal pain. He states that for the past few weeks, the pain has been worse. It is located in his left side and sometimes radiates to his back and into his groin. The pain is intermittent and severe, lasting usually 30 minutes to 1 hour. Today starting at 2 PM, the pain has been constant and severe. He saw his oncologist last week for the pain and has been referred to GI, with appointment tomorrow, but because the pain has been constant he couldn't wait. He wears a fentanyl patch and took 2 doses of oxycodone today with no relief. He received 50 g fentanyl by EMS. He is had one episode of vomiting. No fevers. He denies any dysuria, first noted hematuria tonight in the ER. He has a history of kidney stones but cannot tell whether this feels similar. No change in his ostomy output and no blood in his stool.  Patient is a 55 y.o. male presenting with abdominal pain. The history is provided by the patient.  Abdominal Pain   Past Medical History  Diagnosis Date  . Sarcoid ([place]) 10/2012  . Neuropathy ([place])   . Disorder of peripheral nervous system ([place]) 05/28/2014  . Besnier-Boeck disease ([place]) 12/21/2012  . Extreme obesity ([place]) 12/21/2012  . LBP (low back pain) 05/28/2014  . Adiposity 03/21/2015  . PONV (postoperative nausea and vomiting)   . Iron deficiency anemia due to chronic blood loss 05/08/2015  . Hypertension     has been off BP med x 6 months  . Arthritis   . GERD  (gastroesophageal reflux disease)   . Abdominal pain     mid abdomen  . History of kidney stones   . History of transfusion   . Colon cancer ([place])   . Skin cancer     "it flairs up with sarcoidosis"   Past Surgical History  Procedure Laterality Date  . Cardiac catheterization    . Flexible sigmoidoscopy N/A 05/07/2015    Procedure: FLEXIBLE SIGMOIDOSCOPY;  Surgeon: Laurence Spates, MD;  Location: [facility];  Service: Endoscopy;  Laterality: N/A;  . Laparoscopic partial colectomy N/A 05/07/2015    Procedure: LAPAROSCOPIC ASSITED PARTIAL COLECTOMY WITH COLOSTOMY, SMALL BOWEL RESECTION, EXCISION OF PERITONEAL NODULE;  Surgeon: [name], MD;  Location: [facility];  Service: General;  Laterality: N/A;  . Portacath placement Right 05/27/2015    Procedure: INSERTION PORT-A-CATH;  Surgeon: [name], MD;  Location: [facility];  Service: General;  Laterality: Right;   Family History  Problem Relation Age of Onset  . Arthritis Mother   . Heart disease Mother   . Stroke Father    Social History  Substance Use Topics  . Smoking status: Passive Smoke Exposure - Never Smoker  . Smokeless tobacco: Never Used  . Alcohol Use: No    Review of Systems  Gastrointestinal: Positive for abdominal pain.   10 Systems reviewed and are negative for acute change except as noted in the HPI.   Allergies  Versed; Gabapentin; and Paba derivatives  Home Medications   Prior to Admission medications  Medication Sig Start Date End Date Taking? Authorizing Provider  fentaNYL (DURAGESIC - DOSED MCG/HR) 25 MCG/HR patch Place 1 patch (25 mcg total) onto the skin every 3 (three) days. 03/03/16  Yes Cammie Sickle, MD  oxyCODONE (OXY IR/ROXICODONE) 5 MG immediate release tablet Take 1 tablet (5 mg total) by mouth every 4 (four) hours as needed for severe pain. 03/19/16  Yes Evlyn Kanner, NP  promethazine (PHENERGAN) 25 MG tablet Take 1 tablet (25 mg total) by mouth every 6 (six) hours as needed for  nausea or vomiting. 03/18/16  Yes Evlyn Kanner, NP  zolpidem (AMBIEN) 10 MG tablet Take 10-15 mg by mouth at bedtime as needed for sleep.   Yes Historical Provider, MD  amLODipine (NORVASC) 10 MG tablet Take 1 tablet (10 mg total) by mouth daily. Patient not taking: Reported on 03/18/2016 09/25/15   Forest Gleason, MD  tamsulosin (FLOMAX) 0.4 MG CAPS capsule Take 1 capsule (0.4 mg total) by mouth daily. 03/23/16   Wenda Overland Cherisse Carrell, MD   BP 126/88 mmHg  Pulse 47  Temp(Src) 97.6 F (36.4 C) (Oral)  Resp 16  Ht 5\' 11"  (1.803 m)  Wt 280 lb (127.007 kg)  BMI 39.07 kg/m2  SpO2 98% Physical Exam  Constitutional: He is oriented to person, place, and time. He appears well-developed and well-nourished. No distress.  Uncomfortable  HENT:  Head: Normocephalic and atraumatic.  Moist mucous membranes  Eyes: Conjunctivae are normal. Pupils are equal, round, and reactive to light.  Neck: Neck supple.  Cardiovascular: Normal rate, regular rhythm and normal heart sounds.   No murmur heard. Pulmonary/Chest: Effort normal and breath sounds normal.  Abdominal: Soft. Bowel sounds are normal. He exhibits no distension. There is tenderness (LUQ and LLQ tenderness). There is no rebound and no guarding.  Colostomy draining brown stool  Musculoskeletal: He exhibits no edema.  Neurological: He is alert and oriented to person, place, and time.  Fluent speech  Skin: Skin is warm and dry.  Port in R upper chest, no erythema or drainage  Psychiatric:  Depressed mood  Nursing note and vitals reviewed.   ED Course  Procedures (including critical care time) Labs Review Labs Reviewed  COMPREHENSIVE METABOLIC PANEL - Abnormal; Notable for the following:    Glucose, Bld 106 (*)    All other components within normal limits  CBC - Abnormal; Notable for the following:    WBC 12.9 (*)    All other components within normal limits  URINALYSIS, ROUTINE W REFLEX MICROSCOPIC (NOT AT [facility]) - Abnormal; Notable for  the following:    Color, Urine RED (*)    APPearance CLOUDY (*)    Hgb urine dipstick LARGE (*)    Protein, ur 100 (*)    Leukocytes, UA SMALL (*)    All other components within normal limits  URINE MICROSCOPIC-ADD ON - Abnormal; Notable for the following:    Squamous Epithelial / LPF 0-5 (*)    All other components within normal limits  LIPASE, BLOOD    Imaging Review Ct Renal Stone Study  03/22/2016  CLINICAL DATA:  Left flank pain and dysuria. History of colorectal cancer. Patient has been seen by his oncologist for the symptoms. EXAM: CT ABDOMEN AND PELVIS WITHOUT CONTRAST TECHNIQUE: Multidetector CT imaging of the abdomen and pelvis was performed following the standard protocol without IV contrast. COMPARISON:  05/05/2015 FINDINGS: The lung bases are clear. 3 mm stone in the mid left ureter at the level of L3 with proximal  hydronephrosis and hydroureter. Stranding around the left kidney and ureter. Multiple additional bilateral nonobstructing intrarenal stones. Bladder wall is not thickened. No bladder stones. Unenhanced appearance of the liver, spleen, gallbladder, pancreas, adrenal glands, abdominal aorta, inferior vena cava, and retroperitoneal lymph nodes is unremarkable. Postoperative changes with partial colectomy at the descending level and left lower quadrant colostomy. Small peristomal hernia containing small bowel but without proximal small bowel obstruction. Broad-based anterior abdominal wall hernia containing fat. Additional anastomosis noted in the small bowel. No small or large bowel distention. Scattered stool in the colon. No free air or free fluid in the abdomen. Pelvis: Appendix is not identified. Prostate gland is not enlarged. No free or loculated pelvic fluid collections. No pelvic mass or lymphadenopathy is identified. IMPRESSION: 2.8 mm stone in the mid left ureter with moderate proximal obstruction. Bilateral nonobstructing intrarenal stones. Postoperative changes with  partial colectomy and left lower quadrant colostomy. Small peristomal hernia containing small bowel. No proximal obstruction. Large broad-based anterior abdominal wall hernia containing fat. Electronically Signed   By: Lucienne Capers M.D.   On: 03/22/2016 23:57   I have personally reviewed and evaluated these  lab results as part of my medical decision-making.   EKG Interpretation None     Medications  morphine 4 MG/ML injection 4 mg (4 mg Intravenous Given 03/22/16 2251)  ketorolac (TORADOL) 30 MG/ML injection 30 mg (30 mg Intravenous Given 03/23/16 0024)  oxyCODONE-acetaminophen (PERCOCET/ROXICET) 5-325 MG per tablet 2 tablet (2 tablets Oral Given 03/23/16 0023)  tamsulosin (FLOMAX) capsule 0.4 mg (0.4 mg Oral Given 03/23/16 0022)    MDM   Final diagnoses:  Left ureteral stone   Patient with several week history of left-sided abdominal pain and history of chronic pain presents for left-sided pain that worsened this afternoon and has been constant and severe, associated with vomiting. Patient was uncomfortable but nontoxic on exam with reassuring vital signs. He had left upper quadrant tenderness, normal-appearing colostomy, no peritonitis or rebound. Obtained above lab work which was notable for UA containing large amount of blood, suspicious for kidney stone. Obtained CT which showed 2.8 mm stone in mid left ureter. Given that his pain is left-sided, I suspect that this is the cause of his symptoms. However, the patient does have a complex abdominal history and is concerned because of previous misdiagnosis of diverticulitis when he had colon cancer. I have emphasized the importance of follow-up with his gastroenterologist in addition to urology follow-up. Provided him with urology information as he has never followed with a urologist previously. Gave Percocet, Toradol, and Flomax prior to discharge. On reexamination, he was comfortable and was tolerating liquids. Return precautions reviewed and  patient voiced understanding. Patient discharged in satisfactory condition.  Sharlett Iles, MD 03/23/16 [phone]

## 2016-03-22 NOTE — Telephone Encounter (Signed)
na

## 2016-03-22 NOTE — ED Notes (Signed)
Bed: WTR6 Expected date:  Expected time:  Means of arrival:  Comments: EMS 55 yo M abd pain

## 2016-03-22 NOTE — ED Notes (Signed)
Pt sts he has a port and would like it accessed for blood draw. RN notified.

## 2016-03-22 NOTE — ED Notes (Signed)
Per EMS. Pt from home. Hx of colorectal cancer. Has had intermittent L side abd pain that radiates to back for the past few weeks. Saw oncologist for same on Thursday who referred him to a gastroenterologist. Has appointment with gastro tomorrow. Pt reports pain became worse at 2pm today. Pt has also had 1 episode of n/v. Has tried fentanyl patch and oxycodone for pain with no relief. EMS gave 28mcg fentanyl prior to arrival.

## 2016-03-23 MED ORDER — TAMSULOSIN HCL 0.4 MG PO CAPS: 0.4000 mg | ORAL_CAPSULE | Freq: Every day | ORAL | Status: DC

## 2016-03-23 MED ORDER — TAMSULOSIN HCL 0.4 MG PO CAPS
0.4000 mg | ORAL_CAPSULE | Freq: Once | ORAL | Status: AC
  Administered 2016-03-23: 0.4 mg via ORAL
  Filled 2016-03-23: qty 1

## 2016-03-23 MED ORDER — KETOROLAC TROMETHAMINE 30 MG/ML IJ SOLN
30.0000 mg | Freq: Once | INTRAMUSCULAR | Status: AC
  Administered 2016-03-23: 30 mg via INTRAVENOUS
  Filled 2016-03-23: qty 1

## 2016-03-23 MED ORDER — HEPARIN SOD (PORK) LOCK FLUSH 100 UNIT/ML IV SOLN
500.0000 [IU] | Freq: Once | INTRAVENOUS | Status: AC
  Administered 2016-03-23: 500 [IU]
  Filled 2016-03-23: qty 5

## 2016-03-23 MED ORDER — OXYCODONE-ACETAMINOPHEN 5-325 MG PO TABS
2.0000 | ORAL_TABLET | Freq: Once | ORAL | Status: AC
  Administered 2016-03-23: 2 via ORAL
  Filled 2016-03-23: qty 2

## 2016-03-23 NOTE — Discharge Instructions (Signed)
Kidney Stones °Kidney stones (urolithiasis) are deposits that form inside your kidneys. The intense pain is caused by the stone moving through the urinary tract. When the stone moves, the ureter goes into spasm around the stone. The stone is usually passed in the urine.  °CAUSES  °· A disorder that makes certain neck glands produce too much parathyroid hormone (primary hyperparathyroidism). °· A buildup of uric acid crystals, similar to gout in your joints. °· Narrowing (stricture) of the ureter. °· A kidney obstruction present at birth (congenital obstruction). °· Previous surgery on the kidney or ureters. °· Numerous kidney infections. °SYMPTOMS  °· Feeling sick to your stomach (nauseous). °· Throwing up (vomiting). °· Blood in the urine (hematuria). °· Pain that usually spreads (radiates) to the groin. °· Frequency or urgency of urination. °DIAGNOSIS  °· Taking a history and physical exam. °· Blood or urine tests. °· CT scan. °· Occasionally, an examination of the inside of the urinary bladder (cystoscopy) is performed. °TREATMENT  °· Observation. °· Increasing your fluid intake. °· Extracorporeal shock wave lithotripsy--This is a noninvasive procedure that uses shock waves to break up kidney stones. °· Surgery may be needed if you have severe pain or persistent obstruction. There are various surgical procedures. Most of the procedures are performed with the use of small instruments. Only small incisions are needed to accommodate these instruments, so recovery time is minimized. °The size, location, and chemical composition are all important variables that will determine the proper choice of action for you. Talk to your health care provider to better understand your situation so that you will minimize the risk of injury to yourself and your kidney.  °HOME CARE INSTRUCTIONS  °· Drink enough water and fluids to keep your urine clear or pale yellow. This will help you to pass the stone or stone fragments. °· Strain  all urine through the provided strainer. Keep all particulate matter and stones for your health care provider to see. The stone causing the pain may be as small as a grain of salt. It is very important to use the strainer each and every time you pass your urine. The collection of your stone will allow your health care provider to analyze it and verify that a stone has actually passed. The stone analysis will often identify what you can do to reduce the incidence of recurrences. °· Only take over-the-counter or prescription medicines for pain, discomfort, or fever as directed by your health care provider. °· Keep all follow-up visits as told by your health care provider. This is important. °· Get follow-up X-rays if required. The absence of pain does not always mean that the stone has passed. It may have only stopped moving. If the urine remains completely obstructed, it can cause loss of kidney function or even complete destruction of the kidney. It is your responsibility to make sure X-rays and follow-ups are completed. Ultrasounds of the kidney can show blockages and the status of the kidney. Ultrasounds are not associated with any radiation and can be performed easily in a matter of minutes. °· Make changes to your daily diet as told by your health care provider. You may be told to: °¨ Limit the amount of salt that you eat. °¨ Eat 5 or more servings of fruits and vegetables each day. °¨ Limit the amount of meat, poultry, fish, and eggs that you eat. °· Collect a 24-hour urine sample as told by your health care provider. You may need to collect another urine sample every 6-12   months. °SEEK MEDICAL CARE IF: °· You experience pain that is progressive and unresponsive to any pain medicine you have been prescribed. °SEEK IMMEDIATE MEDICAL CARE IF:  °· Pain cannot be controlled with the prescribed medicine. °· You have a fever or shaking chills. °· The severity or intensity of pain increases over 18 hours and is not  relieved by pain medicine. °· You develop a new onset of abdominal pain. °· You feel faint or pass out. °· You are unable to urinate. °  °This information is not intended to replace advice given to you by your health care provider. Make sure you discuss any questions you have with your health care provider. °  °Document Released: 08/30/2005 Document Revised: 05/21/2015 Document Reviewed: 01/31/2013 °Elsevier Interactive Patient Education ©2016 [employer] ° °

## 2016-04-02 ENCOUNTER — Telehealth: Payer: Self-pay

## 2016-04-02 ENCOUNTER — Other Ambulatory Visit: Payer: Self-pay

## 2016-04-02 DIAGNOSIS — C186 Malignant neoplasm of descending colon: Secondary | ICD-10-CM

## 2016-04-02 NOTE — Telephone Encounter (Signed)
Called and spoke with pts wife and let them know Dr. Jacinto Reap would address fentanyl patch refill on Monday when he returns per Dr. Jacinto Reap.  Pts wife verbalized an understanding and stated that would be fine pt would not be out of medication until Tuesday

## 2016-04-05 ENCOUNTER — Other Ambulatory Visit: Payer: Self-pay | Admitting: *Deleted

## 2016-04-05 DIAGNOSIS — C186 Malignant neoplasm of descending colon: Secondary | ICD-10-CM

## 2016-04-05 MED ORDER — FENTANYL 25 MCG/HR TD PT72: 25.0000 ug | MEDICATED_PATCH | TRANSDERMAL | 0 refills | Status: DC

## 2016-04-12 ENCOUNTER — Ambulatory Visit (INDEPENDENT_AMBULATORY_CARE_PROVIDER_SITE_OTHER): Payer: BLUE CROSS/BLUE SHIELD | Admitting: Urology

## 2016-04-12 ENCOUNTER — Encounter: Payer: Self-pay | Admitting: Urology

## 2016-04-12 VITALS — BP 153/107 | HR 90 | Ht 71.0 in | Wt 285.5 lb

## 2016-04-12 DIAGNOSIS — N200 Calculus of kidney: Secondary | ICD-10-CM | POA: Diagnosis not present

## 2016-04-12 NOTE — Progress Notes (Signed)
H&P  Chief Complaint: Left ureteral stone  History of Present Illness: Patient was seen about 3 weeks ago when he had abdominal pain and may have localized to the left. His UA showed too numerous to count red blood cells and a CT scan revealed a 3 mm left proximal ureteral stone. There were other small scattered bilateral calculi. He passed this stone and also passed a small stone yesterday. Stone passage began Dec 2016 when he started chemo for colon ca. He completed chemo Feb 2017. He is well today. His serum Calcium has been normal.    Past Medical History:  Diagnosis Date  . Abdominal pain    mid abdomen  . Adiposity 03/21/2015  . Arthritis   . Besnier-Boeck disease ([place]) 12/21/2012  . Colon cancer ([place])   . 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 Extreme obesity ([place]) 12/21/2012  . GERD (gastroesophageal reflux disease)   . History of kidney stones   . History of transfusion   . Hypertension    has been off BP med x 6 months  . Iron deficiency anemia due to chronic blood loss 05/08/2015  . LBP (low back pain) 05/28/2014  . Neuropathy ([place])   . PONV (postoperative nausea and vomiting)   . Sarcoid ([place]) 10/2012  . Skin cancer    "it flairs up with sarcoidosis"   Past Surgical History:  Procedure Laterality Date  . CARDIAC CATHETERIZATION    . FLEXIBLE SIGMOIDOSCOPY N/A 05/07/2015   Procedure: FLEXIBLE SIGMOIDOSCOPY;  Surgeon: Laurence Spates, MD;  Location: [facility];  Service: Endoscopy;  Laterality: N/A;  . LAPAROSCOPIC PARTIAL COLECTOMY N/A 05/07/2015   Procedure: LAPAROSCOPIC ASSITED PARTIAL COLECTOMY WITH COLOSTOMY, SMALL BOWEL RESECTION, EXCISION OF PERITONEAL NODULE;  Surgeon: [name], MD;  Location: [facility];  Service: General;  Laterality: N/A;  . PORTACATH PLACEMENT Right 05/27/2015   Procedure: INSERTION PORT-A-CATH;  Surgeon: [name], MD;  Location: [facility];  Service: General;  Laterality: Right;    Home Medications:   (Not in a hospital  admission) Allergies:  Allergies  Allergen Reactions  . Versed [Midazolam] Nausea And Vomiting  . Gabapentin     Swallowing problems  . Paba Derivatives Nausea And Vomiting    Pt states he is allergic to unknown anesthesia. Pt has nausea and vomiting with anesthesia.     Family History  Problem Relation Age of Onset  . Arthritis Mother   . Heart disease Mother   . Stroke Father   . Prostate cancer Neg Hx   . Bladder Cancer Neg Hx    Social History:  reports that he is a non-smoker but has been exposed to tobacco smoke. He has never used smokeless tobacco. He reports that he does not drink alcohol or use drugs.  ROS: A complete review of systems was performed.  All systems are negative except for pertinent findings as noted. Review of Systems  Constitutional: Positive for diaphoresis, fever, malaise/fatigue and weight loss.  Eyes: Positive for blurred vision.  Gastrointestinal: Positive for diarrhea, heartburn, nausea and vomiting.  Musculoskeletal: Positive for back pain and joint pain.  Neurological: Positive for dizziness.  Endo/Heme/Allergies: Positive for polydipsia.     Physical Exam:  Vital signs in last 24 hours: @VSRANGES @ General:  Alert and oriented, No acute distress HEENT: Normocephalic, atraumatic Neck: No JVD or lymphadenopathy Cardiovascular: Regular rate and rhythm Lungs: Regular rate and effort Abdomen: Soft, nontender, nondistended, no abdominal masses Back: No CVA tenderness Extremities: No edema Neurologic: Grossly intact  Laboratory Data:  No results found for this or any previous visit (from the past 24 hour(s)). No results found for this or any previous visit (from the past 240 hour(s)). Creatinine: No results for input(s): CREATININE in the last 168 hours.  Impression/Assessment/plan:  Ureteral stone - passed.   Renal stones - check KUB, serum and 24 hr urine eval.    Devera Englander 04/12/2016, 2:01 PM

## 2016-04-13 ENCOUNTER — Telehealth: Payer: Self-pay | Admitting: *Deleted

## 2016-04-13 ENCOUNTER — Telehealth: Payer: Self-pay

## 2016-04-13 LAB — URINALYSIS, COMPLETE
BILIRUBIN UA: NEGATIVE
Glucose, UA: NEGATIVE
Ketones, UA: NEGATIVE
NITRITE UA: NEGATIVE
PH UA: 7 (ref 5.0–7.5)
PROTEIN UA: NEGATIVE
RBC UA: NEGATIVE
Specific Gravity, UA: 1.015 (ref 1.005–1.030)
UUROB: 0.2 mg/dL (ref 0.2–1.0)

## 2016-04-13 LAB — MICROSCOPIC EXAMINATION

## 2016-04-13 LAB — URIC ACID: Uric Acid: 4.8 mg/dL (ref 3.7–8.6)

## 2016-04-13 LAB — PARATHYROID HORMONE, INTACT (NO CA): PTH: 31 pg/mL (ref 15–65)

## 2016-04-13 LAB — MAGNESIUM: MAGNESIUM: 2.2 mg/dL (ref 1.6–2.3)

## 2016-04-13 NOTE — Telephone Encounter (Signed)
Requesting refill of oxycodone.  Please call patient's wife when script is ready to pick up.  Would like to pick it up Wednesday or Thursday.

## 2016-04-13 NOTE — Telephone Encounter (Signed)
-----   Message from Festus Aloe, MD sent at 04/13/2016  9:07 AM EDT ----- Notify patient his blood work was normal. Give results.   ----- Message ----- From: Orlene Erm, CMA Sent: 04/13/2016   7:56 AM To: Festus Aloe, MD    ----- Message ----- From: Lavone Neri Lab Results In Sent: 04/13/2016   5:40 AM To: [facility]

## 2016-04-13 NOTE — Telephone Encounter (Signed)
LMOM for patient to return call about labs.

## 2016-04-14 ENCOUNTER — Other Ambulatory Visit: Payer: Self-pay

## 2016-04-14 ENCOUNTER — Telehealth: Payer: Self-pay

## 2016-04-14 DIAGNOSIS — C186 Malignant neoplasm of descending colon: Secondary | ICD-10-CM

## 2016-04-14 MED ORDER — OXYCODONE HCL 5 MG PO TABS: 5.0000 mg | ORAL_TABLET | ORAL | 0 refills | Status: DC | PRN

## 2016-04-14 NOTE — Telephone Encounter (Signed)
Patient is requesting pain medication refill, Rx printed and left at front desk

## 2016-04-14 NOTE — Telephone Encounter (Signed)
Spoke with patient and gave results. 

## 2016-04-28 ENCOUNTER — Telehealth: Payer: Self-pay | Admitting: *Deleted

## 2016-04-28 NOTE — Telephone Encounter (Signed)
Patient is seeing surgeon (Dr. Barkley Bruns) at [facility] for possible colostomy reversal.  Per patient's wife, surgeon would like for patient to have CT scan prior to surgery.  Patient is scheduled for colonoscopy at [facility] on [place] 05-27-16.  Wants to know if you will order scan prior to that date?  Mrs. Formoso's phone number is [phone].

## 2016-04-29 ENCOUNTER — Telehealth: Payer: Self-pay | Admitting: *Deleted

## 2016-04-29 ENCOUNTER — Other Ambulatory Visit: Payer: Self-pay | Admitting: Urology

## 2016-04-29 ENCOUNTER — Other Ambulatory Visit: Payer: Self-pay | Admitting: Internal Medicine

## 2016-04-29 DIAGNOSIS — C186 Malignant neoplasm of descending colon: Secondary | ICD-10-CM

## 2016-04-29 NOTE — Telephone Encounter (Signed)
Dr Rogue Bussing entering order

## 2016-04-29 NOTE — Telephone Encounter (Signed)
Reports that he is having surgery 9/14 and is going to need a CT and Colonscopy prior to that, Requesting that we go ahead and order CT week of 9/5 to be done at [facility].

## 2016-05-03 ENCOUNTER — Other Ambulatory Visit: Payer: Self-pay | Admitting: *Deleted

## 2016-05-03 DIAGNOSIS — C186 Malignant neoplasm of descending colon: Secondary | ICD-10-CM

## 2016-05-03 MED ORDER — FENTANYL 25 MCG/HR TD PT72: 25.0000 ug | MEDICATED_PATCH | TRANSDERMAL | 0 refills | Status: DC

## 2016-05-03 MED ORDER — OXYCODONE HCL 5 MG PO TABS: 5.0000 mg | ORAL_TABLET | ORAL | 0 refills | Status: DC | PRN

## 2016-05-06 ENCOUNTER — Other Ambulatory Visit: Payer: Self-pay | Admitting: Urology

## 2016-05-11 ENCOUNTER — Other Ambulatory Visit: Payer: Self-pay | Admitting: *Deleted

## 2016-05-11 DIAGNOSIS — C189 Malignant neoplasm of colon, unspecified: Secondary | ICD-10-CM

## 2016-05-12 ENCOUNTER — Ambulatory Visit (INDEPENDENT_AMBULATORY_CARE_PROVIDER_SITE_OTHER): Payer: BLUE CROSS/BLUE SHIELD | Admitting: Urology

## 2016-05-12 VITALS — BP 137/8 | HR 94 | Ht 71.0 in | Wt 291.9 lb

## 2016-05-12 DIAGNOSIS — N200 Calculus of kidney: Secondary | ICD-10-CM

## 2016-05-12 NOTE — Progress Notes (Signed)
F/U note  Chief Complaint: kidney stones  History of Present Illness:   1) kidney stones  - He was seen Jul 2017 after passing a 3 mm left proximal stone. His CT other small scattered bilateral calculi. He's passed several stones since Dec 2016 when he started chemo for colon ca. He completed chemo Feb 2017.   Today, patient is seen for the above and to review metabolic eval for kidney stones and KUB. His Magnesium, Ca, PTH, and uric acid were all normal. Urine pH = 7. He did not complete 24 hr urine or get a KUB. He continues to have left groin pain. He did not leave a urine today. He is getting a new CT A/P next week by his gen surgeon to consider colostomy reversal.   UA today -- not certain if he left a sample.    Past Medical History:  Diagnosis Date  . Abdominal pain    mid abdomen  . Adiposity 03/21/2015  . Arthritis   . Besnier-Boeck disease ([place]) 12/21/2012  . Colon cancer ([place])   . 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 Extreme obesity ([place]) 12/21/2012  . GERD (gastroesophageal reflux disease)   . History of kidney stones   . History of transfusion   . Hypertension    has been off BP med x 6 months  . Iron deficiency anemia due to chronic blood loss 05/08/2015  . LBP (low back pain) 05/28/2014  . Neuropathy ([place])   . PONV (postoperative nausea and vomiting)   . Sarcoid ([place]) 10/2012  . Skin cancer    "it flairs up with sarcoidosis"   Past Surgical History:  Procedure Laterality Date  . CARDIAC CATHETERIZATION    . FLEXIBLE SIGMOIDOSCOPY N/A 05/07/2015   Procedure: FLEXIBLE SIGMOIDOSCOPY;  Surgeon: Laurence Spates, MD;  [facility];  Service: Endoscopy;  Laterality: N/A;  . LAPAROSCOPIC PARTIAL COLECTOMY N/A 05/07/2015   Procedure: LAPAROSCOPIC ASSITED PARTIAL COLECTOMY WITH COLOSTOMY, SMALL BOWEL RESECTION, EXCISION OF PERITONEAL NODULE;  Surgeon: [name], MD;  Location: [facility];  Service: General;  Laterality: N/A;  . PORTACATH PLACEMENT  Right 05/27/2015   Procedure: INSERTION PORT-A-CATH;  Surgeon: [name], MD;  Location: [facility];  Service: General;  Laterality: Right;    Home Medications:   (Not in a hospital admission) Allergies:  Allergies  Allergen Reactions  . Versed [Midazolam] Nausea And Vomiting  . Gabapentin     Swallowing problems  . Paba Derivatives Nausea And Vomiting    Pt states he is allergic to unknown anesthesia. Pt has nausea and vomiting with anesthesia.     Family History  Problem Relation Age of Onset  . Arthritis Mother   . Heart disease Mother   . Stroke Father   . Prostate cancer Neg Hx   . Bladder Cancer Neg Hx    Social History:  reports that he is a non-smoker but has been exposed to tobacco smoke. He has never used smokeless tobacco. He reports that he does not drink alcohol or use drugs.  ROS: A complete review of systems was performed.  All systems are negative except for pertinent findings as noted. Review of Systems  Musculoskeletal: Positive for back pain and joint pain.  Neurological: Positive for dizziness.     Physical Exam:  Vital signs in last 24 hours: @VSRANGES @ General:  Alert and oriented, No acute distress HEENT: Normocephalic, atraumatic Neck: No JVD or lymphadenopathy Cardiovascular: Regular rate and rhythm Lungs: Regular rate and effort Abdomen:  Soft, nontender, nondistended, no abdominal masses Back: No CVA tenderness Extremities: No edema Neurologic: Grossly intact  Laboratory Data:  No results found for this or any previous visit (from the past 24 hour(s)). No results found for this or any previous visit (from the past 240 hour(s)). Creatinine: No results for input(s): CREATININE in the last 168 hours.  Impression/Assessment/plan:  Kidney stones - groin pain - I thought he had passed the prior ureteral stone, but given continued symptoms he'll return on 2-3 weeks to review his new CT scan and for symptom check, UA.   Brandi Armato,  Maleaha Hughett 05/12/2016, 2:00 PM

## 2016-05-13 ENCOUNTER — Encounter (INDEPENDENT_AMBULATORY_CARE_PROVIDER_SITE_OTHER): Payer: Self-pay

## 2016-05-13 ENCOUNTER — Inpatient Hospital Stay: Payer: BLUE CROSS/BLUE SHIELD

## 2016-05-13 ENCOUNTER — Inpatient Hospital Stay: Payer: BLUE CROSS/BLUE SHIELD | Attending: Internal Medicine | Admitting: Internal Medicine

## 2016-05-13 VITALS — BP 143/96 | HR 92 | Temp 97.4°F | Resp 18 | Wt 292.4 lb

## 2016-05-13 DIAGNOSIS — M1990 Unspecified osteoarthritis, unspecified site: Secondary | ICD-10-CM | POA: Insufficient documentation

## 2016-05-13 DIAGNOSIS — E669 Obesity, unspecified: Secondary | ICD-10-CM | POA: Insufficient documentation

## 2016-05-13 DIAGNOSIS — C186 Malignant neoplasm of descending colon: Secondary | ICD-10-CM

## 2016-05-13 DIAGNOSIS — G629 Polyneuropathy, unspecified: Secondary | ICD-10-CM | POA: Diagnosis not present

## 2016-05-13 DIAGNOSIS — Z9221 Personal history of antineoplastic chemotherapy: Secondary | ICD-10-CM | POA: Insufficient documentation

## 2016-05-13 DIAGNOSIS — C189 Malignant neoplasm of colon, unspecified: Secondary | ICD-10-CM

## 2016-05-13 DIAGNOSIS — Z933 Colostomy status: Secondary | ICD-10-CM | POA: Diagnosis not present

## 2016-05-13 DIAGNOSIS — K219 Gastro-esophageal reflux disease without esophagitis: Secondary | ICD-10-CM | POA: Diagnosis not present

## 2016-05-13 DIAGNOSIS — Z87442 Personal history of urinary calculi: Secondary | ICD-10-CM | POA: Insufficient documentation

## 2016-05-13 DIAGNOSIS — Z85038 Personal history of other malignant neoplasm of large intestine: Secondary | ICD-10-CM | POA: Diagnosis present

## 2016-05-13 DIAGNOSIS — Z79899 Other long term (current) drug therapy: Secondary | ICD-10-CM

## 2016-05-13 DIAGNOSIS — I10 Essential (primary) hypertension: Secondary | ICD-10-CM | POA: Insufficient documentation

## 2016-05-13 DIAGNOSIS — D869 Sarcoidosis, unspecified: Secondary | ICD-10-CM | POA: Diagnosis not present

## 2016-05-13 DIAGNOSIS — Z9049 Acquired absence of other specified parts of digestive tract: Secondary | ICD-10-CM | POA: Insufficient documentation

## 2016-05-13 DIAGNOSIS — Z95828 Presence of other vascular implants and grafts: Secondary | ICD-10-CM

## 2016-05-13 LAB — CBC WITH DIFFERENTIAL/PLATELET
Basophils Absolute: 0 10*3/uL (ref 0–0.1)
Basophils Relative: 1 %
EOS ABS: 0.1 10*3/uL (ref 0–0.7)
Eosinophils Relative: 1 %
HEMATOCRIT: 38.4 % — AB (ref 40.0–52.0)
HEMOGLOBIN: 13 g/dL (ref 13.0–18.0)
LYMPHS ABS: 1.8 10*3/uL (ref 1.0–3.6)
Lymphocytes Relative: 21 %
MCH: 30.4 pg (ref 26.0–34.0)
MCHC: 33.8 g/dL (ref 32.0–36.0)
MCV: 90.1 fL (ref 80.0–100.0)
MONOS PCT: 8 %
Monocytes Absolute: 0.7 10*3/uL (ref 0.2–1.0)
NEUTROS PCT: 69 %
Neutro Abs: 6.1 10*3/uL (ref 1.4–6.5)
Platelets: 208 10*3/uL (ref 150–440)
RBC: 4.27 MIL/uL — ABNORMAL LOW (ref 4.40–5.90)
RDW: 14.7 % — ABNORMAL HIGH (ref 11.5–14.5)
WBC: 8.8 10*3/uL (ref 3.8–10.6)

## 2016-05-13 LAB — COMPREHENSIVE METABOLIC PANEL
ALK PHOS: 72 U/L (ref 38–126)
ALT: 21 U/L (ref 17–63)
ANION GAP: 9 (ref 5–15)
AST: 20 U/L (ref 15–41)
Albumin: 3.9 g/dL (ref 3.5–5.0)
BILIRUBIN TOTAL: 0.6 mg/dL (ref 0.3–1.2)
BUN: 9 mg/dL (ref 6–20)
CALCIUM: 8.5 mg/dL — AB (ref 8.9–10.3)
CO2: 25 mmol/L (ref 22–32)
Chloride: 107 mmol/L (ref 101–111)
Creatinine, Ser: 0.85 mg/dL (ref 0.61–1.24)
GFR calc non Af Amer: >60 mL/min (ref >60)
Glucose, Bld: 129 mg/dL — ABNORMAL HIGH (ref 65–99)
Potassium: 3.4 mmol/L — ABNORMAL LOW (ref 3.5–5.1)
SODIUM: 141 mmol/L (ref 135–145)
TOTAL PROTEIN: 7.3 g/dL (ref 6.5–8.1)

## 2016-05-13 MED ORDER — SODIUM CHLORIDE 0.9% FLUSH
10.0000 mL | INTRAVENOUS | Status: DC | PRN
  Administered 2016-05-13: 10 mL via INTRAVENOUS
  Filled 2016-05-13: qty 10

## 2016-05-13 MED ORDER — HEPARIN SOD (PORK) LOCK FLUSH 100 UNIT/ML IV SOLN
500.0000 [IU] | Freq: Once | INTRAVENOUS | Status: AC
  Administered 2016-05-13: 500 [IU] via INTRAVENOUS

## 2016-05-13 NOTE — Progress Notes (Signed)
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 Specimen integrity: Intact. Macroscopic tumor perforation: Not identified. Invasive tumor: Maximum size: 5.5 cm. Histologic type(s): Invasive adenocarcinoma. Histologic grade and differentiation: G1: well differentiated/low grade  Pathologic Staging: pT4b, pN1b, pM1b MSI stable K-ras wild-type not mutated by [employer] study. 2.Patient was started on FOLFOX in September of 2016,after 2 cycles of chemotherapy patient developed neuropathy. Patient had a previous neuropathy as a baseline which increased so was switched over to FOLFIRI I IN December of 2016 As patient has clear os wild-type we can proceed to add either a Avastin or cetuximab from the next chemotherapy.(November, 2016)  Avastin was added as abdominal wound has completely healed now Avastin on hold (December, 2016) because of hypertension Chemotherapy was discontinued after protocol 8 cycle because of significant side effect (last chemotherapy was on October 28, 2015).  #   # sarcoidosis/ Lung- [[facility]]- on surveillance  # Neuropathy- lyrica -off. ? 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 I reviewed the patient's prior charts/pertinent labs/imaging in detail; findings are summarized above.    INTERVAL HISTORY:  [name] 55 y.o.  male pleasant patient above history of metastatic colon cancer currently off therapy since febl 2017 because of intolerance/side effects is here for follow-up. Patient has been evaluated in [place] for reversal of colostomy. He is awaiting CT scan/colonoscopy next week.  Patient has chronic tingling and numbness of his feet. Also complains of tingling and numbness of his face [question neurosarcoidosis as per patient]. No new weight loss. Appetite fair. No blood in stools or black stools.  REVIEW OF SYSTEMS:  A complete 10 point review of system is done which is negative except mentioned above/history of present illness.   PAST MEDICAL HISTORY :  Past Medical History:  Diagnosis Date  . Abdominal pain    mid abdomen  . Adiposity 03/21/2015  . Arthritis   . Besnier-Boeck disease ([place]) 12/21/2012  . Colon cancer ([place])   . Disorder of peripheral nervous system ([place]) 05/28/2014  . Extreme obesity ([place]) 12/21/2012  . GERD (gastroesophageal reflux disease)   . History of kidney stones   . History of transfusion   . Hypertension    has been off BP med x 6 months  . Iron deficiency anemia due to chronic blood loss 05/08/2015  . LBP (low back pain) 05/28/2014  . Neuropathy ([place])   . PONV (postoperative nausea and vomiting)   . Sarcoid ([place]) 10/2012  . Skin cancer    "it flairs up with sarcoidosis"    PAST SURGICAL HISTORY :   Past Surgical History:  Procedure Laterality Date  . CARDIAC CATHETERIZATION    . FLEXIBLE SIGMOIDOSCOPY N/A 05/07/2015  Procedure: FLEXIBLE SIGMOIDOSCOPY;  Surgeon: Laurence Spates, MD;  Location: [facility];  Service: Endoscopy;  Laterality: N/A;  . LAPAROSCOPIC PARTIAL COLECTOMY N/A 05/07/2015   Procedure: LAPAROSCOPIC ASSITED PARTIAL COLECTOMY WITH COLOSTOMY, SMALL BOWEL RESECTION, EXCISION OF  PERITONEAL NODULE;  Surgeon: [name], MD;  Location: [facility];  Service: General;  Laterality: N/A;  . PORTACATH PLACEMENT Right 05/27/2015   Procedure: INSERTION PORT-A-CATH;  Surgeon: [name], MD;  Location: [facility];  Service: General;  Laterality: Right;    FAMILY HISTORY :   Family History  Problem Relation Age of Onset  . Arthritis Mother   . Heart disease Mother   . Stroke Father   . Prostate cancer Neg Hx   . Bladder Cancer Neg Hx     SOCIAL HISTORY:   Social History  Substance Use Topics  . Smoking status: Passive Smoke Exposure - Never Smoker  . Smokeless tobacco: Never Used  . Alcohol use No    ALLERGIES:  is allergic to versed [midazolam]; gabapentin; and paba derivatives.  MEDICATIONS:  Current Outpatient Prescriptions  Medication Sig Dispense Refill  . amLODipine (NORVASC) 10 MG tablet Take 1 tablet (10 mg total) by mouth daily. 30 tablet 3  . fentaNYL (DURAGESIC - DOSED MCG/HR) 25 MCG/HR patch Place 1 patch (25 mcg total) onto the skin every 3 (three) days. 10 patch 0  . oxyCODONE (OXY IR/ROXICODONE) 5 MG immediate release tablet Take 1 tablet (5 mg total) by mouth every 4 (four) hours as needed for severe pain. 50 tablet 0  . promethazine (PHENERGAN) 25 MG tablet Take 1 tablet (25 mg total) by mouth every 6 (six) hours as needed for nausea or vomiting. 30 tablet 0  . zolpidem (AMBIEN) 10 MG tablet Take 10-15 mg by mouth at bedtime as needed for sleep.     No current facility-administered medications for this visit.    Facility-Administered Medications Ordered in Other Visits  Medication Dose Route Frequency Provider Last Rate Last Dose  . sodium chloride 0.9 % injection 10 mL  10 mL Intracatheter PRN Forest Gleason, MD   10 mL at 08/02/15 0955  . sodium chloride flush (NS) 0.9 % injection 10 mL  10 mL Intravenous PRN Forest Gleason, MD   10 mL at 02/11/16 1326  . sodium chloride flush (NS) 0.9 % injection 10 mL  10 mL Intravenous PRN Cammie Sickle, MD   10 mL at 05/13/16 1443    PHYSICAL EXAMINATION: ECOG PERFORMANCE STATUS: 0 - Asymptomatic  BP (!) 143/96 (BP Location: Left Arm, Patient Position: Sitting)   Pulse 92   Temp 97.4 F (36.3 C) (Tympanic)   Resp 18   Wt 292 lb 6 oz (132.6 kg)   BMI 40.78 kg/m   Filed Weights   05/13/16 1510  Weight: 292 lb 6 oz (132.6 kg)    GENERAL: Well-nourished well-developed; Alert, no distress and comfortable.  Obese.  EYES: no pallor or icterus OROPHARYNX: no thrush or ulceration; good dentition  NECK: supple, no masses felt LYMPH:  no palpable lymphadenopathy in the cervical, axillary or inguinal regions LUNGS: clear to auscultation and  No wheeze or crackles HEART/CVS: regular rate & rhythm and no murmurs; No lower extremity edema ABDOMEN:abdomen soft, non-tender and normal bowel sounds Musculoskeletal:no cyanosis of digits and no clubbing  PSYCH: alert & oriented x 3 with fluent speech NEURO: no focal motor/sensory deficits SKIN:  no rashes or significant lesions  LABORATORY DATA:  I have reviewed the data as listed  Component Value Date/Time   NA 141 05/13/2016 1424   NA 138 01/02/2015 1555   K 3.4 (L) 05/13/2016 1424   K 3.7 01/02/2015 1555   CL 107 05/13/2016 1424   CL 109 01/02/2015 1555   CO2 25 05/13/2016 1424   CO2 22 01/02/2015 1555   GLUCOSE 129 (H) 05/13/2016 1424   GLUCOSE 110 (H) 01/02/2015 1555   BUN 9 05/13/2016 1424   BUN 9 01/02/2015 1555   CREATININE 0.85 05/13/2016 1424   CREATININE 1.04 01/02/2015 1555   CALCIUM 8.5 (L) 05/13/2016 1424   CALCIUM 8.8 (L) 01/02/2015 1555   PROT 7.3 05/13/2016 1424   PROT 8.2 (H) 01/02/2015 1555   ALBUMIN 3.9 05/13/2016 1424   ALBUMIN 4.0 01/02/2015 1555   AST 20 05/13/2016 1424   AST 39 01/02/2015 1555   ALT 21 05/13/2016 1424   ALT 55 01/02/2015 1555   ALKPHOS 72 05/13/2016 1424   ALKPHOS 125 01/02/2015 1555   BILITOT 0.6 05/13/2016 1424   BILITOT 0.5 01/02/2015 1555   GFRNONAA >60 05/13/2016  1424   GFRNONAA >60 01/02/2015 1555   GFRAA >60 05/13/2016 1424   GFRAA >60 01/02/2015 1555    No results found for: SPEP, UPEP  Lab Results  Component Value Date   WBC 8.8 05/13/2016   NEUTROABS 6.1 05/13/2016   HGB 13.0 05/13/2016   HCT 38.4 (L) 05/13/2016   MCV 90.1 05/13/2016   PLT 208 05/13/2016      Chemistry      Component Value Date/Time   NA 141 05/13/2016 1424   NA 138 01/02/2015 1555   K 3.4 (L) 05/13/2016 1424   K 3.7 01/02/2015 1555   CL 107 05/13/2016 1424   CL 109 01/02/2015 1555   CO2 25 05/13/2016 1424   CO2 22 01/02/2015 1555   BUN 9 05/13/2016 1424   BUN 9 01/02/2015 1555   CREATININE 0.85 05/13/2016 1424   CREATININE 1.04 01/02/2015 1555      Component Value Date/Time   CALCIUM 8.5 (L) 05/13/2016 1424   CALCIUM 8.8 (L) 01/02/2015 1555   ALKPHOS 72 05/13/2016 1424   ALKPHOS 125 01/02/2015 1555   AST 20 05/13/2016 1424   AST 39 01/02/2015 1555   ALT 21 05/13/2016 1424   ALT 55 01/02/2015 1555   BILITOT 0.6 05/13/2016 1424   BILITOT 0.5 01/02/2015 1555       RADIOGRAPHIC STUDIES: I have personally reviewed the radiological images as listed and agreed with the findings in the report. No results found.   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 Patient interested in having reversal of colostomy; awaiting chest abdomen pelvis CT next week; colonoscopy- Dr. Oralia Rud; Surgeon- Dr.Rosenbauer/ [place]. Today's CBC CBC normal. Clinically no evidence of recurrence.  # Dizziness/ fatigued- ? Neuropathy-   # pain management- PN/ awaiting consultation next week  # follow up in 47month/ 6 week port flush  Will call re: CT -- [phone]/home.   # 25 minutes face-to-face with the patient discussing the above plan of care; more than 50% of time spent on prognosis/ natural history; counseling and coordination.   Orders Placed This  Encounter  Procedures  . Comprehensive metabolic panel    Standing Status:   Future    Standing Expiration Date:   05/13/2017  . CBC with Differential    Standing Status:   Future    Standing Expiration  Date:   05/13/2017  . CEA    Standing Status:   Future    Standing Expiration Date:   05/13/2017   All questions were answered. The patient knows to call the clinic with any problems, questions or concerns.      Cammie Sickle, MD 05/13/2016 4:12 PM

## 2016-05-13 NOTE — Assessment & Plan Note (Addendum)
#   Colon cancer with metastases; most recent scan March 2017 NED. Patient interested in having reversal of colostomy; awaiting chest abdomen pelvis CT next week; colonoscopy- Dr. Oralia Rud; Surgeon- Dr.Rosenbauer/ [place]. Today's CBC CBC normal. Clinically no evidence of recurrence.  # Dizziness/ fatigued- ? Neuropathy-   # pain management- PN/ awaiting consultation next week  # follow up in 4months/ 6 week port flush  Will call re: CT [id number] home.   # 25 minutes face-to-face with the patient discussing the above plan of care; more than 50% of time spent on prognosis/ natural history; counseling and coordination.

## 2016-05-14 LAB — CEA: CEA: 4.6 ng/mL (ref 0.0–4.7)

## 2016-05-19 ENCOUNTER — Ambulatory Visit: Admission: RE | Admit: 2016-05-19 | Payer: BLUE CROSS/BLUE SHIELD | Source: Ambulatory Visit

## 2016-05-20 ENCOUNTER — Encounter: Payer: Self-pay | Admitting: Anesthesiology

## 2016-05-20 ENCOUNTER — Ambulatory Visit: Payer: BLUE CROSS/BLUE SHIELD | Attending: Anesthesiology | Admitting: Anesthesiology

## 2016-05-20 VITALS — BP 123/89 | HR 93 | Resp 18 | Ht 71.0 in | Wt 290.0 lb

## 2016-05-20 DIAGNOSIS — M5431 Sciatica, right side: Secondary | ICD-10-CM | POA: Diagnosis not present

## 2016-05-20 DIAGNOSIS — R52 Pain, unspecified: Secondary | ICD-10-CM | POA: Insufficient documentation

## 2016-05-20 DIAGNOSIS — M47817 Spondylosis without myelopathy or radiculopathy, lumbosacral region: Secondary | ICD-10-CM | POA: Diagnosis not present

## 2016-05-20 DIAGNOSIS — C189 Malignant neoplasm of colon, unspecified: Secondary | ICD-10-CM

## 2016-05-20 DIAGNOSIS — M5432 Sciatica, left side: Secondary | ICD-10-CM | POA: Insufficient documentation

## 2016-05-20 DIAGNOSIS — M5136 Other intervertebral disc degeneration, lumbar region: Secondary | ICD-10-CM | POA: Diagnosis not present

## 2016-05-20 DIAGNOSIS — G609 Hereditary and idiopathic neuropathy, unspecified: Secondary | ICD-10-CM

## 2016-05-20 NOTE — Patient Instructions (Signed)
GENERAL RISKS AND COMPLICATIONS  What are the risk, side effects and possible complications? Generally speaking, most procedures are safe.  However, with any procedure there are risks, side effects, and the possibility of complications.  The risks and complications are dependent upon the sites that are lesioned, or the type of nerve block to be performed.  The closer the procedure is to the spine, the more serious the risks are.  Great care is taken when placing the radio frequency needles, block needles or lesioning probes, but sometimes complications can occur. 1. Infection: Any time there is an injection through the skin, there is a risk of infection.  This is why sterile conditions are used for these blocks.  There are four possible types of infection. 1. Localized skin infection. 2. Central Nervous System Infection-This can be in the form of Meningitis, which can be deadly. 3. Epidural Infections-This can be in the form of an epidural abscess, which can cause pressure inside of the spine, causing compression of the spinal cord with subsequent paralysis. This would require an emergency surgery to decompress, and there are no guarantees that the patient would recover from the paralysis. 4. Discitis-This is an infection of the intervertebral discs.  It occurs in about 1% of discography procedures.  It is difficult to treat and it may lead to surgery.        2. Pain: the needles have to go through skin and soft tissues, will cause soreness.       3. Damage to internal structures:  The nerves to be lesioned may be near blood vessels or    other nerves which can be potentially damaged.       4. Bleeding: Bleeding is more common if the patient is taking blood thinners such as  aspirin, Coumadin, Ticiid, Plavix, etc., or if he/she have some genetic predisposition  such as hemophilia. Bleeding into the spinal canal can cause compression of the spinal  cord with subsequent paralysis.  This would require an  emergency surgery to  decompress and there are no guarantees that the patient would recover from the  paralysis.       5. Pneumothorax:  Puncturing of a lung is a possibility, every time a needle is introduced in  the area of the chest or upper back.  Pneumothorax refers to free air around the  collapsed lung(s), inside of the thoracic cavity (chest cavity).  Another two possible  complications related to a similar event would include: Hemothorax and Chylothorax.   These are variations of the Pneumothorax, where instead of air around the collapsed  lung(s), you may have blood or chyle, respectively.       6. Spinal headaches: They may occur with any procedures in the area of the spine.       7. Persistent CSF (Cerebro-Spinal Fluid) leakage: This is a rare problem, but may occur  with prolonged intrathecal or epidural catheters either due to the formation of a fistulous  track or a dural tear.       8. Nerve damage: By working so close to the spinal cord, there is always a possibility of  nerve damage, which could be as serious as a permanent spinal cord injury with  paralysis.       9. Death:  Although rare, severe deadly allergic reactions known as "Anaphylactic  reaction" can occur to any of the medications used.      10. Worsening of the symptoms:  We can always make thing worse.    What are the chances of something like this happening? Chances of any of this occuring are extremely low.  By statistics, you have more of a chance of getting killed in a motor vehicle accident: while driving to the hospital than any of the above occurring .  Nevertheless, you should be aware that they are possibilities.  In general, it is similar to taking a shower.  Everybody knows that you can slip, hit your head and get killed.  Does that mean that you should not shower again?  Nevertheless always keep in mind that statistics do not mean anything if you happen to be on the wrong side of them.  Even if a procedure has a 1  (one) in a 1,000,000 (million) chance of going wrong, it you happen to be that one..Also, keep in mind that by statistics, you have more of a chance of having something go wrong when taking medications.  Who should not have this procedure? If you are on a blood thinning medication (e.g. Coumadin, Plavix, see list of "Blood Thinners"), or if you have an active infection going on, you should not have the procedure.  If you are taking any blood thinners, please inform your physician.  How should I prepare for this procedure?  Do not eat or drink anything at least six hours prior to the procedure.  Bring a driver with you .  It cannot be a taxi.  Come accompanied by an adult that can drive you back, and that is strong enough to help you if your legs get weak or numb from the local anesthetic.  Take all of your medicines the morning of the procedure with just enough water to swallow them.  If you have diabetes, make sure that you are scheduled to have your procedure done first thing in the morning, whenever possible.  If you have diabetes, take only half of your insulin dose and notify our nurse that you have done so as soon as you arrive at the clinic.  If you are diabetic, but only take blood sugar pills (oral hypoglycemic), then do not take them on the morning of your procedure.  You may take them after you have had the procedure.  Do not take aspirin or any aspirin-containing medications, at least eleven (11) days prior to the procedure.  They may prolong bleeding.  Wear loose fitting clothing that may be easy to take off and that you would not mind if it got stained with Betadine or blood.  Do not wear any jewelry or perfume  Remove any nail coloring.  It will interfere with some of our monitoring equipment.  NOTE: Remember that this is not meant to be interpreted as a complete list of all possible complications.  Unforeseen problems may occur.  BLOOD THINNERS The following drugs  contain aspirin or other products, which can cause increased bleeding during surgery and should not be taken for 2 weeks prior to and 1 week after surgery.  If you should need take something for relief of minor pain, you may take acetaminophen which is found in Tylenol,m Datril, Anacin-3 and Panadol. It is not blood thinner. The products listed below are.  Do not take any of the products listed below in addition to any listed on your instruction sheet.  A.P.C or A.P.C with Codeine Codeine Phosphate Capsules #3 Ibuprofen Ridaura  ABC compound Congesprin Imuran rimadil  Advil Cope Indocin Robaxisal  Alka-Seltzer Effervescent Pain Reliever and Antacid Coricidin or Coricidin-D  Indomethacin Rufen    Alka-Seltzer plus Cold Medicine Cosprin Ketoprofen S-A-C Tablets  Anacin Analgesic Tablets or Capsules Coumadin Korlgesic Salflex  Anacin Extra Strength Analgesic tablets or capsules CP-2 Tablets Lanoril Salicylate  Anaprox Cuprimine Capsules Levenox Salocol  Anexsia-D Dalteparin Magan Salsalate  Anodynos Darvon compound Magnesium Salicylate Sine-off  Ansaid Dasin Capsules Magsal Sodium Salicylate  Anturane Depen Capsules Marnal Soma  APF Arthritis pain formula Dewitt's Pills Measurin Stanback  Argesic Dia-Gesic Meclofenamic Sulfinpyrazone  Arthritis Bayer Timed Release Aspirin Diclofenac Meclomen Sulindac  Arthritis pain formula Anacin Dicumarol Medipren Supac  Analgesic (Safety coated) Arthralgen Diffunasal Mefanamic Suprofen  Arthritis Strength Bufferin Dihydrocodeine Mepro Compound Suprol  Arthropan liquid Dopirydamole Methcarbomol with Aspirin Synalgos  ASA tablets/Enseals Disalcid Micrainin Tagament  Ascriptin Doan's Midol Talwin  Ascriptin A/D Dolene Mobidin Tanderil  Ascriptin Extra Strength Dolobid Moblgesic Ticlid  Ascriptin with Codeine Doloprin or Doloprin with Codeine Momentum Tolectin  Asperbuf Duoprin Mono-gesic Trendar  Aspergum Duradyne Motrin or Motrin IB Triminicin  Aspirin  plain, buffered or enteric coated Durasal Myochrisine Trigesic  Aspirin Suppositories Easprin Nalfon Trillsate  Aspirin with Codeine Ecotrin Regular or Extra Strength Naprosyn Uracel  Atromid-S Efficin Naproxen Ursinus  Auranofin Capsules Elmiron Neocylate Vanquish  Axotal Emagrin Norgesic Verin  Azathioprine Empirin or Empirin with Codeine Normiflo Vitamin E  Azolid Emprazil Nuprin Voltaren  Bayer Aspirin plain, buffered or children's or timed BC Tablets or powders Encaprin Orgaran Warfarin Sodium  Buff-a-Comp Enoxaparin Orudis Zorpin  Buff-a-Comp with Codeine Equegesic Os-Cal-Gesic   Buffaprin Excedrin plain, buffered or Extra Strength Oxalid   Bufferin Arthritis Strength Feldene Oxphenbutazone   Bufferin plain or Extra Strength Feldene Capsules Oxycodone with Aspirin   Bufferin with Codeine Fenoprofen Fenoprofen Pabalate or Pabalate-SF   Buffets II Flogesic Panagesic   Buffinol plain or Extra Strength Florinal or Florinal with Codeine Panwarfarin   Buf-Tabs Flurbiprofen Penicillamine   Butalbital Compound Four-way cold tablets Penicillin   Butazolidin Fragmin Pepto-Bismol   Carbenicillin Geminisyn Percodan   Carna Arthritis Reliever Geopen Persantine   Carprofen Gold's salt Persistin   Chloramphenicol Goody's Phenylbutazone   Chloromycetin Haltrain Piroxlcam   Clmetidine heparin Plaquenil   Cllnoril Hyco-pap Ponstel   Clofibrate Hydroxy chloroquine Propoxyphen         Before stopping any of these medications, be sure to consult the physician who ordered them.  Some, such as Coumadin (Warfarin) are ordered to prevent or treat serious conditions such as "deep thrombosis", "pumonary embolisms", and other heart problems.  The amount of time that you may need off of the medication may also vary with the medication and the reason for which you were taking it.  If you are taking any of these medications, please make sure you notify your pain physician before you undergo any  procedures.         Epidural Steroid Injection Patient Information  Description: The epidural space surrounds the nerves as they exit the spinal cord.  In some patients, the nerves can be compressed and inflamed by a bulging disc or a tight spinal canal (spinal stenosis).  By injecting steroids into the epidural space, we can bring irritated nerves into direct contact with a potentially helpful medication.  These steroids act directly on the irritated nerves and can reduce swelling and inflammation which often leads to decreased pain.  Epidural steroids may be injected anywhere along the spine and from the neck to the low back depending upon the location of your pain.   After numbing the skin with local anesthetic (like Novocaine), a small needle is passed   into the epidural space slowly.  You may experience a sensation of pressure while this is being done.  The entire block usually last less than 10 minutes.  Conditions which may be treated by epidural steroids:   Low back and leg pain  Neck and arm pain  Spinal stenosis  Post-laminectomy syndrome  Herpes zoster (shingles) pain  Pain from compression fractures  Preparation for the injection:  1. Do not eat any solid food or dairy products within 8 hours of your appointment.  2. You may drink clear liquids up to 3 hours before appointment.  Clear liquids include water, black coffee, juice or soda.  No milk or cream please. 3. You may take your regular medication, including pain medications, with a sip of water before your appointment  Diabetics should hold regular insulin (if taken separately) and take 1/2 normal NPH dos the morning of the procedure.  Carry some sugar containing items with you to your appointment. 4. A driver must accompany you and be prepared to drive you home after your procedure.  5. Bring all your current medications with your. 6. An IV may be inserted and sedation may be given at the discretion of the  physician.   7. A blood pressure cuff, EKG and other monitors will often be applied during the procedure.  Some patients may need to have extra oxygen administered for a short period. 8. You will be asked to provide medical information, including your allergies, prior to the procedure.  We must know immediately if you are taking blood thinners (like Coumadin/Warfarin)  Or if you are allergic to IV iodine contrast (dye). We must know if you could possible be pregnant.  Possible side-effects:  Bleeding from needle site  Infection (rare, may require surgery)  Nerve injury (rare)  Numbness & tingling (temporary)  Difficulty urinating (rare, temporary)  Spinal headache ( a headache worse with upright posture)  Light -headedness (temporary)  Pain at injection site (several days)  Decreased blood pressure (temporary)  Weakness in arm/leg (temporary)  Pressure sensation in back/neck (temporary)  Call if you experience:  Fever/chills associated with headache or increased back/neck pain.  Headache worsened by an upright position.  New onset weakness or numbness of an extremity below the injection site  Hives or difficulty breathing (go to the emergency room)  Inflammation or drainage at the infection site  Severe back/neck pain  Any new symptoms which are concerning to you  Please note:  Although the local anesthetic injected can often make your back or neck feel good for several hours after the injection, the pain will likely return.  It takes 3-7 days for steroids to work in the epidural space.  You may not notice any pain relief for at least that one week.  If effective, we will often do a series of three injections spaced 3-6 weeks apart to maximally decrease your pain.  After the initial series, we generally will wait several months before considering a repeat injection of the same type.  If you have any questions, please call [phone] [place] 8 HOURS PRIOR TO PROCEDURE BRING A DRIVER TAKE BLOOD PRESSURE MEDICATION THE MORNING OF PROCEDURE.

## 2016-05-24 ENCOUNTER — Other Ambulatory Visit: Payer: Self-pay | Admitting: *Deleted

## 2016-05-24 ENCOUNTER — Telehealth: Payer: Self-pay | Admitting: *Deleted

## 2016-05-24 DIAGNOSIS — C186 Malignant neoplasm of descending colon: Secondary | ICD-10-CM

## 2016-05-24 MED ORDER — OXYCODONE HCL 5 MG PO TABS: 5.0000 mg | ORAL_TABLET | ORAL | 0 refills | Status: DC | PRN
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
## 2016-05-24 NOTE — Telephone Encounter (Signed)
Contacted patient's wife to inform her that requested prescription  Is at the front desk.

## 2016-05-28 LAB — TOXASSURE SELECT 13 (MW), URINE

## 2016-06-02 ENCOUNTER — Other Ambulatory Visit: Payer: Self-pay | Admitting: *Deleted

## 2016-06-02 ENCOUNTER — Telehealth: Payer: Self-pay | Admitting: Anesthesiology

## 2016-06-02 DIAGNOSIS — C186 Malignant neoplasm of descending colon: Secondary | ICD-10-CM

## 2016-06-02 MED ORDER — FENTANYL 25 MCG/HR TD PT72: 25.0000 ug | MEDICATED_PATCH | TRANSDERMAL | 0 refills | Status: DC

## 2016-06-02 NOTE — Telephone Encounter (Signed)
[facility] called and needs to know if Dr. Andree Elk is going to write meds for patient as he is calling them for pain meds and his pain has nothing to do with anything they are treating him for. Please call Hassan Rowan at [phone] and let her know

## 2016-06-02 NOTE — Telephone Encounter (Signed)
[name] at [facility] contacted, instructed to have them continue writing pain meds.

## 2016-06-02 NOTE — Telephone Encounter (Signed)
Patient has been seen and evaluated by Dr Andree Elk in pain management, I have a call in to his office to see if he is going to prescribe for patient

## 2016-06-09 ENCOUNTER — Ambulatory Visit: Payer: BLUE CROSS/BLUE SHIELD

## 2016-06-14 ENCOUNTER — Ambulatory Visit: Payer: BLUE CROSS/BLUE SHIELD | Attending: Anesthesiology | Admitting: Anesthesiology

## 2016-06-14 ENCOUNTER — Encounter: Payer: Self-pay | Admitting: Anesthesiology

## 2016-06-14 ENCOUNTER — Ambulatory Visit: Payer: BLUE CROSS/BLUE SHIELD

## 2016-06-14 ENCOUNTER — Other Ambulatory Visit: Payer: Self-pay | Admitting: *Deleted

## 2016-06-14 VITALS — BP 136/61 | HR 91 | Temp 98.2°F | Resp 11 | Ht 71.0 in | Wt 285.0 lb

## 2016-06-14 DIAGNOSIS — Z85828 Personal history of other malignant neoplasm of skin: Secondary | ICD-10-CM | POA: Insufficient documentation

## 2016-06-14 DIAGNOSIS — E669 Obesity, unspecified: Secondary | ICD-10-CM | POA: Diagnosis not present

## 2016-06-14 DIAGNOSIS — Z85048 Personal history of other malignant neoplasm of rectum, rectosigmoid junction, and anus: Secondary | ICD-10-CM | POA: Diagnosis not present

## 2016-06-14 DIAGNOSIS — M47817 Spondylosis without myelopathy or radiculopathy, lumbosacral region: Secondary | ICD-10-CM

## 2016-06-14 DIAGNOSIS — Z87442 Personal history of urinary calculi: Secondary | ICD-10-CM | POA: Insufficient documentation

## 2016-06-14 DIAGNOSIS — Z79899 Other long term (current) drug therapy: Secondary | ICD-10-CM | POA: Diagnosis not present

## 2016-06-14 DIAGNOSIS — Z823 Family history of stroke: Secondary | ICD-10-CM | POA: Insufficient documentation

## 2016-06-14 DIAGNOSIS — M5136 Other intervertebral disc degeneration, lumbar region: Secondary | ICD-10-CM | POA: Diagnosis not present

## 2016-06-14 DIAGNOSIS — I10 Essential (primary) hypertension: Secondary | ICD-10-CM | POA: Insufficient documentation

## 2016-06-14 DIAGNOSIS — M542 Cervicalgia: Secondary | ICD-10-CM | POA: Diagnosis not present

## 2016-06-14 DIAGNOSIS — D869 Sarcoidosis, unspecified: Secondary | ICD-10-CM | POA: Diagnosis not present

## 2016-06-14 DIAGNOSIS — K219 Gastro-esophageal reflux disease without esophagitis: Secondary | ICD-10-CM | POA: Diagnosis not present

## 2016-06-14 DIAGNOSIS — M5431 Sciatica, right side: Secondary | ICD-10-CM

## 2016-06-14 DIAGNOSIS — R200 Anesthesia of skin: Secondary | ICD-10-CM | POA: Diagnosis not present

## 2016-06-14 DIAGNOSIS — D509 Iron deficiency anemia, unspecified: Secondary | ICD-10-CM | POA: Insufficient documentation

## 2016-06-14 DIAGNOSIS — Z7722 Contact with and (suspected) exposure to environmental tobacco smoke (acute) (chronic): Secondary | ICD-10-CM | POA: Diagnosis not present

## 2016-06-14 DIAGNOSIS — G609 Hereditary and idiopathic neuropathy, unspecified: Secondary | ICD-10-CM | POA: Insufficient documentation

## 2016-06-14 DIAGNOSIS — M4697 Unspecified inflammatory spondylopathy, lumbosacral region: Secondary | ICD-10-CM | POA: Diagnosis not present

## 2016-06-14 DIAGNOSIS — M5432 Sciatica, left side: Secondary | ICD-10-CM | POA: Diagnosis not present

## 2016-06-14 DIAGNOSIS — C186 Malignant neoplasm of descending colon: Secondary | ICD-10-CM

## 2016-06-14 DIAGNOSIS — Z9889 Other specified postprocedural states: Secondary | ICD-10-CM | POA: Diagnosis not present

## 2016-06-14 MED ORDER — TRIAMCINOLONE ACETONIDE 40 MG/ML IJ SUSP
INTRAMUSCULAR | Status: AC
  Administered 2016-06-14: 16:00:00
  Filled 2016-06-14: qty 1

## 2016-06-14 MED ORDER — MIDAZOLAM HCL 5 MG/5ML IJ SOLN
INTRAMUSCULAR | Status: AC
  Administered 2016-06-14: 3 mg
  Filled 2016-06-14: qty 5

## 2016-06-14 MED ORDER — IOPAMIDOL (ISOVUE-M 200) INJECTION 41%
20.0000 mL | Freq: Once | INTRAMUSCULAR | Status: DC | PRN
Start: 2016-06-14 — End: 2016-08-18

## 2016-06-14 MED ORDER — LACTATED RINGERS IV SOLN: 1000.0000 mL | INTRAVENOUS | Status: DC

## 2016-06-14 MED ORDER — IOPAMIDOL (ISOVUE-M 200) INJECTION 41%
INTRAMUSCULAR | Status: AC
  Administered 2016-06-14: 16:00:00
  Filled 2016-06-14: qty 10

## 2016-06-14 MED ORDER — SODIUM CHLORIDE 0.9% FLUSH: 10.0000 mL | Freq: Once | INTRAVENOUS | Status: DC

## 2016-06-14 MED ORDER — ROPIVACAINE HCL 2 MG/ML IJ SOLN
10.0000 mL | Freq: Once | INTRAMUSCULAR | Status: DC
  Filled 2016-06-14: qty 10

## 2016-06-14 MED ORDER — OXYCODONE HCL 5 MG PO TABS: 5.0000 mg | ORAL_TABLET | ORAL | 0 refills | Status: DC | PRN

## 2016-06-14 MED ORDER — LIDOCAINE HCL (PF) 1 % IJ SOLN
5.0000 mL | Freq: Once | INTRAMUSCULAR | Status: DC
  Filled 2016-06-14: qty 5

## 2016-06-14 MED ORDER — MIDAZOLAM HCL 2 MG/2ML IJ SOLN
5.0000 mg | Freq: Once | INTRAMUSCULAR | Status: DC
  Filled 2016-06-14: qty 5

## 2016-06-14 NOTE — Patient Instructions (Signed)
Epidural Steroid Injection Patient Information  Description: The epidural space surrounds the nerves as they exit the spinal cord.  In some patients, the nerves can be compressed and inflamed by a bulging disc or a tight spinal canal (spinal stenosis).  By injecting steroids into the epidural space, we can bring irritated nerves into direct contact with a potentially helpful medication.  These steroids act directly on the irritated nerves and can reduce swelling and inflammation which often leads to decreased pain.  Epidural steroids may be injected anywhere along the spine and from the neck to the low back depending upon the location of your pain.   After numbing the skin with local anesthetic (like Novocaine), a small needle is passed into the epidural space slowly.  You may experience a sensation of pressure while this is being done.  The entire block usually last less than 10 minutes.  Conditions which may be treated by epidural steroids:   Low back and leg pain  Neck and arm pain  Spinal stenosis  Post-laminectomy syndrome  Herpes zoster (shingles) pain  Pain from compression fractures  Preparation for the injection:  1. Do not eat any solid food or dairy products within 8 hours of your appointment.  2. You may drink clear liquids up to 3 hours before appointment.  Clear liquids include water, black coffee, juice or soda.  No milk or cream please. 3. You may take your regular medication, including pain medications, with a sip of water before your appointment  Diabetics should hold regular insulin (if taken separately) and take 1/2 normal NPH dos the morning of the procedure.  Carry some sugar containing items with you to your appointment. 4. A driver must accompany you and be prepared to drive you home after your procedure.  5. Bring all your current medications with your. 6. An IV may be inserted and sedation may be given at the discretion of the physician.   7. A blood pressure  cuff, EKG and other monitors will often be applied during the procedure.  Some patients may need to have extra oxygen administered for a short period. 8. You will be asked to provide medical information, including your allergies, prior to the procedure.  We must know immediately if you are taking blood thinners (like Coumadin/Warfarin)  Or if you are allergic to IV iodine contrast (dye). We must know if you could possible be pregnant.  Possible side-effects:  Bleeding from needle site  Infection (rare, may require surgery)  Nerve injury (rare)  Numbness & tingling (temporary)  Difficulty urinating (rare, temporary)  Spinal headache ( a headache worse with upright posture)  Light -headedness (temporary)  Pain at injection site (several days)  Decreased blood pressure (temporary)  Weakness in arm/leg (temporary)  Pressure sensation in back/neck (temporary)  Call if you experience:  Fever/chills associated with headache or increased back/neck pain.  Headache worsened by an upright position.  New onset weakness or numbness of an extremity below the injection site  Hives or difficulty breathing (go to the emergency room)  Inflammation or drainage at the infection site  Severe back/neck pain  Any new symptoms which are concerning to you  Please note:  Although the local anesthetic injected can often make your back or neck feel good for several hours after the injection, the pain will likely return.  It takes 3-7 days for steroids to work in the epidural space.  You may not notice any pain relief for at least that one week.    If effective, we will often do a series of three injections spaced 3-6 weeks apart to maximally decrease your pain.  After the initial series, we generally will wait several months before considering a repeat injection of the same type.  If you have any questions, please call [phone] [facility] AND  COMPLICATIONS  What are the risk, side effects and possible complications? Generally speaking, most procedures are safe.  However, with any procedure there are risks, side effects, and the possibility of complications.  The risks and complications are dependent upon the sites that are lesioned, or the type of nerve block to be performed.  The closer the procedure is to the spine, the more serious the risks are.  Great care is taken when placing the radio frequency needles, block needles or lesioning probes, but sometimes complications can occur. 1. Infection: Any time there is an injection through the skin, there is a risk of infection.  This is why sterile conditions are used for these blocks.  There are four possible types of infection. 1. Localized skin infection. 2. Central Nervous System Infection-This can be in the form of Meningitis, which can be deadly. 3. Epidural Infections-This can be in the form of an epidural abscess, which can cause pressure inside of the spine, causing compression of the spinal cord with subsequent paralysis. This would require an emergency surgery to decompress, and there are no guarantees that the patient would recover from the paralysis. 4. Discitis-This is an infection of the intervertebral discs.  It occurs in about 1% of discography procedures.  It is difficult to treat and it may lead to surgery.        2. Pain: the needles have to go through skin and soft tissues, will cause soreness.       3. Damage to internal structures:  The nerves to be lesioned may be near blood vessels or    other nerves which can be potentially damaged.       4. Bleeding: Bleeding is more common if the patient is taking blood thinners such as  aspirin, Coumadin, Ticiid, Plavix, etc., or if he/she have some genetic predisposition  such as hemophilia. Bleeding into the spinal canal can cause compression of the spinal  cord with subsequent paralysis.  This would require an emergency surgery  to  decompress and there are no guarantees that the patient would recover from the  paralysis.       5. Pneumothorax:  Puncturing of a lung is a possibility, every time a needle is introduced in  the area of the chest or upper back.  Pneumothorax refers to free air around the  collapsed lung(s), inside of the thoracic cavity (chest cavity).  Another two possible  complications related to a similar event would include: Hemothorax and Chylothorax.   These are variations of the Pneumothorax, where instead of air around the collapsed  lung(s), you may have blood or chyle, respectively.       6. Spinal headaches: They may occur with any procedures in the area of the spine.       7. Persistent CSF (Cerebro-Spinal Fluid) leakage: This is a rare problem, but may occur  with prolonged intrathecal or epidural catheters either due to the formation of a fistulous  track or a dural tear.       8. Nerve damage: By working so close to the spinal cord, there is always a possibility of  nerve damage, which could be   as serious as a permanent spinal cord injury with  paralysis.       9. Death:  Although rare, severe deadly allergic reactions known as "Anaphylactic  reaction" can occur to any of the medications used.      10. Worsening of the symptoms:  We can always make thing worse.  What are the chances of something like this happening? Chances of any of this occuring are extremely low.  By statistics, you have more of a chance of getting killed in a motor vehicle accident: while driving to the hospital than any of the above occurring .  Nevertheless, you should be aware that they are possibilities.  In general, it is similar to taking a shower.  Everybody knows that you can slip, hit your head and get killed.  Does that mean that you should not shower again?  Nevertheless always keep in mind that statistics do not mean anything if you happen to be on the wrong side of them.  Even if a procedure has a 1 (one) in a 1,000,000  (million) chance of going wrong, it you happen to be that one..Also, keep in mind that by statistics, you have more of a chance of having something go wrong when taking medications.  Who should not have this procedure? If you are on a blood thinning medication (e.g. Coumadin, Plavix, see list of "Blood Thinners"), or if you have an active infection going on, you should not have the procedure.  If you are taking any blood thinners, please inform your physician.  How should I prepare for this procedure?  Do not eat or drink anything at least six hours prior to the procedure.  Bring a driver with you .  It cannot be a taxi.  Come accompanied by an adult that can drive you back, and that is strong enough to help you if your legs get weak or numb from the local anesthetic.  Take all of your medicines the morning of the procedure with just enough water to swallow them.  If you have diabetes, make sure that you are scheduled to have your procedure done first thing in the morning, whenever possible.  If you have diabetes, take only half of your insulin dose and notify our nurse that you have done so as soon as you arrive at the clinic.  If you are diabetic, but only take blood sugar pills (oral hypoglycemic), then do not take them on the morning of your procedure.  You may take them after you have had the procedure.  Do not take aspirin or any aspirin-containing medications, at least eleven (11) days prior to the procedure.  They may prolong bleeding.  Wear loose fitting clothing that may be easy to take off and that you would not mind if it got stained with Betadine or blood.  Do not wear any jewelry or perfume  Remove any nail coloring.  It will interfere with some of our monitoring equipment.  NOTE: Remember that this is not meant to be interpreted as a complete list of all possible complications.  Unforeseen problems may occur.  BLOOD THINNERS The following drugs contain aspirin or other  products, which can cause increased bleeding during surgery and should not be taken for 2 weeks prior to and 1 week after surgery.  If you should need take something for relief of minor pain, you may take acetaminophen which is found in Tylenol,m Datril, Anacin-3 and Panadol. It is not blood thinner. The products listed below   are.  Do not take any of the products listed below in addition to any listed on your instruction sheet.  A.P.C or A.P.C with Codeine Codeine Phosphate Capsules #3 Ibuprofen Ridaura  ABC compound Congesprin Imuran rimadil  Advil Cope Indocin Robaxisal  Alka-Seltzer Effervescent Pain Reliever and Antacid Coricidin or Coricidin-D  Indomethacin Rufen  Alka-Seltzer plus Cold Medicine Cosprin Ketoprofen S-A-C Tablets  Anacin Analgesic Tablets or Capsules Coumadin Korlgesic Salflex  Anacin Extra Strength Analgesic tablets or capsules CP-2 Tablets Lanoril Salicylate  Anaprox Cuprimine Capsules Levenox Salocol  Anexsia-D Dalteparin Magan Salsalate  Anodynos Darvon compound Magnesium Salicylate Sine-off  Ansaid Dasin Capsules Magsal Sodium Salicylate  Anturane Depen Capsules Marnal Soma  APF Arthritis pain formula Dewitt's Pills Measurin Stanback  Argesic Dia-Gesic Meclofenamic Sulfinpyrazone  Arthritis Bayer Timed Release Aspirin Diclofenac Meclomen Sulindac  Arthritis pain formula Anacin Dicumarol Medipren Supac  Analgesic (Safety coated) Arthralgen Diffunasal Mefanamic Suprofen  Arthritis Strength Bufferin Dihydrocodeine Mepro Compound Suprol  Arthropan liquid Dopirydamole Methcarbomol with Aspirin Synalgos  ASA tablets/Enseals Disalcid Micrainin Tagament  Ascriptin Doan's Midol Talwin  Ascriptin A/D Dolene Mobidin Tanderil  Ascriptin Extra Strength Dolobid Moblgesic Ticlid  Ascriptin with Codeine Doloprin or Doloprin with Codeine Momentum Tolectin  Asperbuf Duoprin Mono-gesic Trendar  Aspergum Duradyne Motrin or Motrin IB Triminicin  Aspirin plain, buffered or enteric  coated Durasal Myochrisine Trigesic  Aspirin Suppositories Easprin Nalfon Trillsate  Aspirin with Codeine Ecotrin Regular or Extra Strength Naprosyn Uracel  Atromid-S Efficin Naproxen Ursinus  Auranofin Capsules Elmiron Neocylate Vanquish  Axotal Emagrin Norgesic Verin  Azathioprine Empirin or Empirin with Codeine Normiflo Vitamin E  Azolid Emprazil Nuprin Voltaren  Bayer Aspirin plain, buffered or children's or timed BC Tablets or powders Encaprin Orgaran Warfarin Sodium  Buff-a-Comp Enoxaparin Orudis Zorpin  Buff-a-Comp with Codeine Equegesic Os-Cal-Gesic   Buffaprin Excedrin plain, buffered or Extra Strength Oxalid   Bufferin Arthritis Strength Feldene Oxphenbutazone   Bufferin plain or Extra Strength Feldene Capsules Oxycodone with Aspirin   Bufferin with Codeine Fenoprofen Fenoprofen Pabalate or Pabalate-SF   Buffets II Flogesic Panagesic   Buffinol plain or Extra Strength Florinal or Florinal with Codeine Panwarfarin   Buf-Tabs Flurbiprofen Penicillamine   Butalbital Compound Four-way cold tablets Penicillin   Butazolidin Fragmin Pepto-Bismol   Carbenicillin Geminisyn Percodan   Carna Arthritis Reliever Geopen Persantine   Carprofen Gold's salt Persistin   Chloramphenicol Goody's Phenylbutazone   Chloromycetin Haltrain Piroxlcam   Clmetidine heparin Plaquenil   Cllnoril Hyco-pap Ponstel   Clofibrate Hydroxy chloroquine Propoxyphen         Before stopping any of these medications, be sure to consult the physician who ordered them.  Some, such as Coumadin (Warfarin) are ordered to prevent or treat serious conditions such as "deep thrombosis", "pumonary embolisms", and other heart problems.  The amount of time that you may need off of the medication may also vary with the medication and the reason for which you were taking it.  If you are taking any of these medications, please make sure you notify your pain physician before you undergo any procedures.          
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
## 2016-06-24 ENCOUNTER — Inpatient Hospital Stay: Payer: BLUE CROSS/BLUE SHIELD | Attending: Internal Medicine

## 2016-06-24 ENCOUNTER — Other Ambulatory Visit: Payer: Self-pay | Admitting: *Deleted

## 2016-06-24 DIAGNOSIS — Z9221 Personal history of antineoplastic chemotherapy: Secondary | ICD-10-CM | POA: Diagnosis not present

## 2016-06-24 DIAGNOSIS — Z85038 Personal history of other malignant neoplasm of large intestine: Secondary | ICD-10-CM | POA: Diagnosis not present

## 2016-06-24 DIAGNOSIS — D869 Sarcoidosis, unspecified: Secondary | ICD-10-CM | POA: Diagnosis not present

## 2016-06-24 DIAGNOSIS — Z95828 Presence of other vascular implants and grafts: Secondary | ICD-10-CM

## 2016-06-24 DIAGNOSIS — G629 Polyneuropathy, unspecified: Secondary | ICD-10-CM | POA: Insufficient documentation

## 2016-06-24 DIAGNOSIS — Z933 Colostomy status: Secondary | ICD-10-CM | POA: Insufficient documentation

## 2016-06-24 DIAGNOSIS — Z452 Encounter for adjustment and management of vascular access device: Secondary | ICD-10-CM | POA: Diagnosis not present

## 2016-06-24 MED ORDER — HEPARIN SOD (PORK) LOCK FLUSH 100 UNIT/ML IV SOLN
500.0000 [IU] | Freq: Once | INTRAVENOUS | Status: AC
  Administered 2016-06-24: 500 [IU] via INTRAVENOUS

## 2016-06-24 MED ORDER — SODIUM CHLORIDE 0.9% FLUSH
10.0000 mL | INTRAVENOUS | Status: DC | PRN
  Administered 2016-06-24: 10 mL via INTRAVENOUS
  Filled 2016-06-24: qty 10

## 2016-06-24 MED ORDER — PROMETHAZINE HCL 25 MG PO TABS: 25.0000 mg | ORAL_TABLET | Freq: Four times a day (QID) | ORAL | 1 refills | Status: DC | PRN

## 2016-06-29 ENCOUNTER — Other Ambulatory Visit: Payer: Self-pay | Admitting: *Deleted

## 2016-06-29 DIAGNOSIS — C186 Malignant neoplasm of descending colon: Secondary | ICD-10-CM

## 2016-06-29 MED ORDER — FENTANYL 25 MCG/HR TD PT72: 25.0000 ug | MEDICATED_PATCH | TRANSDERMAL | 0 refills | Status: DC

## 2016-06-29 MED ORDER — OXYCODONE HCL 5 MG PO TABS: 5.0000 mg | ORAL_TABLET | ORAL | 0 refills | Status: DC | PRN

## 2016-07-05 ENCOUNTER — Ambulatory Visit: Payer: BLUE CROSS/BLUE SHIELD

## 2016-07-14 ENCOUNTER — Ambulatory Visit: Payer: BLUE CROSS/BLUE SHIELD | Admitting: Anesthesiology

## 2016-07-15 ENCOUNTER — Other Ambulatory Visit: Payer: Self-pay | Admitting: *Deleted

## 2016-07-15 DIAGNOSIS — C186 Malignant neoplasm of descending colon: Secondary | ICD-10-CM

## 2016-07-15 MED ORDER — OXYCODONE HCL 5 MG PO TABS: 5.0000 mg | ORAL_TABLET | ORAL | 0 refills | Status: DC | PRN

## 2016-07-19 ENCOUNTER — Ambulatory Visit: Payer: BLUE CROSS/BLUE SHIELD

## 2016-07-26 ENCOUNTER — Telehealth: Payer: Self-pay | Admitting: *Deleted

## 2016-07-26 DIAGNOSIS — C186 Malignant neoplasm of descending colon: Secondary | ICD-10-CM

## 2016-07-26 MED ORDER — OXYCODONE HCL 5 MG PO TABS: 5.0000 mg | ORAL_TABLET | ORAL | 0 refills | Status: DC | PRN

## 2016-07-26 MED ORDER — FENTANYL 25 MCG/HR TD PT72: 25.0000 ug | MEDICATED_PATCH | TRANSDERMAL | 0 refills | Status: DC

## 2016-08-02 ENCOUNTER — Inpatient Hospital Stay: Payer: BLUE CROSS/BLUE SHIELD

## 2016-08-02 ENCOUNTER — Inpatient Hospital Stay: Payer: BLUE CROSS/BLUE SHIELD | Attending: Internal Medicine | Admitting: Internal Medicine

## 2016-08-02 VITALS — BP 144/99 | HR 88 | Temp 97.5°F | Resp 18 | Wt 296.0 lb

## 2016-08-02 DIAGNOSIS — M1990 Unspecified osteoarthritis, unspecified site: Secondary | ICD-10-CM | POA: Diagnosis not present

## 2016-08-02 DIAGNOSIS — E669 Obesity, unspecified: Secondary | ICD-10-CM | POA: Diagnosis not present

## 2016-08-02 DIAGNOSIS — Z933 Colostomy status: Secondary | ICD-10-CM | POA: Diagnosis not present

## 2016-08-02 DIAGNOSIS — G893 Neoplasm related pain (acute) (chronic): Secondary | ICD-10-CM

## 2016-08-02 DIAGNOSIS — D869 Sarcoidosis, unspecified: Secondary | ICD-10-CM | POA: Insufficient documentation

## 2016-08-02 DIAGNOSIS — G629 Polyneuropathy, unspecified: Secondary | ICD-10-CM | POA: Diagnosis not present

## 2016-08-02 DIAGNOSIS — Z85038 Personal history of other malignant neoplasm of large intestine: Secondary | ICD-10-CM | POA: Diagnosis not present

## 2016-08-02 DIAGNOSIS — I10 Essential (primary) hypertension: Secondary | ICD-10-CM | POA: Diagnosis not present

## 2016-08-02 DIAGNOSIS — Z79899 Other long term (current) drug therapy: Secondary | ICD-10-CM | POA: Diagnosis not present

## 2016-08-02 DIAGNOSIS — K219 Gastro-esophageal reflux disease without esophagitis: Secondary | ICD-10-CM | POA: Diagnosis not present

## 2016-08-02 DIAGNOSIS — Z7722 Contact with and (suspected) exposure to environmental tobacco smoke (acute) (chronic): Secondary | ICD-10-CM | POA: Diagnosis not present

## 2016-08-02 DIAGNOSIS — C186 Malignant neoplasm of descending colon: Secondary | ICD-10-CM

## 2016-08-02 DIAGNOSIS — Z87442 Personal history of urinary calculi: Secondary | ICD-10-CM | POA: Diagnosis not present

## 2016-08-02 DIAGNOSIS — Z85828 Personal history of other malignant neoplasm of skin: Secondary | ICD-10-CM | POA: Insufficient documentation

## 2016-08-02 LAB — CBC WITH DIFFERENTIAL/PLATELET
BASOS ABS: 0.1 10*3/uL (ref 0–0.1)
BASOS PCT: 1 %
Eosinophils Absolute: 0.1 10*3/uL (ref 0–0.7)
Eosinophils Relative: 1 %
HEMATOCRIT: 37.6 % — AB (ref 40.0–52.0)
HEMOGLOBIN: 12.5 g/dL — AB (ref 13.0–18.0)
LYMPHS PCT: 25 %
Lymphs Abs: 1.9 10*3/uL (ref 1.0–3.6)
MCH: 29.2 pg (ref 26.0–34.0)
MCHC: 33.3 g/dL (ref 32.0–36.0)
MCV: 87.7 fL (ref 80.0–100.0)
Monocytes Absolute: 0.6 10*3/uL (ref 0.2–1.0)
Monocytes Relative: 8 %
NEUTROS ABS: 5.1 10*3/uL (ref 1.4–6.5)
NEUTROS PCT: 65 %
Platelets: 234 10*3/uL (ref 150–440)
RBC: 4.29 MIL/uL — ABNORMAL LOW (ref 4.40–5.90)
RDW: 13.6 % (ref 11.5–14.5)
WBC: 7.8 10*3/uL (ref 3.8–10.6)

## 2016-08-02 LAB — COMPREHENSIVE METABOLIC PANEL
ALK PHOS: 70 U/L (ref 38–126)
ALT: 18 U/L (ref 17–63)
ANION GAP: 6 (ref 5–15)
AST: 22 U/L (ref 15–41)
Albumin: 3.9 g/dL (ref 3.5–5.0)
BILIRUBIN TOTAL: 0.5 mg/dL (ref 0.3–1.2)
BUN: 14 mg/dL (ref 6–20)
CALCIUM: 9 mg/dL (ref 8.9–10.3)
CO2: 27 mmol/L (ref 22–32)
Chloride: 107 mmol/L (ref 101–111)
Creatinine, Ser: 1.21 mg/dL (ref 0.61–1.24)
GFR calc non Af Amer: >60 mL/min (ref >60)
Glucose, Bld: 114 mg/dL — ABNORMAL HIGH (ref 65–99)
POTASSIUM: 3.5 mmol/L (ref 3.5–5.1)
SODIUM: 140 mmol/L (ref 135–145)
TOTAL PROTEIN: 7.2 g/dL (ref 6.5–8.1)

## 2016-08-02 MED ORDER — OXYCODONE HCL 5 MG PO TABS: 5.0000 mg | ORAL_TABLET | ORAL | 0 refills | Status: DC | PRN

## 2016-08-02 MED ORDER — SODIUM CHLORIDE 0.9% FLUSH
10.0000 mL | INTRAVENOUS | Status: DC | PRN
  Administered 2016-08-02: 10 mL via INTRAVENOUS
  Filled 2016-08-02: qty 10

## 2016-08-02 MED ORDER — HEPARIN SOD (PORK) LOCK FLUSH 100 UNIT/ML IV SOLN
500.0000 [IU] | Freq: Once | INTRAVENOUS | Status: AC
  Administered 2016-08-02: 500 [IU] via INTRAVENOUS

## 2016-08-02 NOTE — Assessment & Plan Note (Addendum)
#   Colon cancer with metastases; most recent scan July 2017 NED. Clinically no evidence of progression. Colostomy reversal on HOLD. Repeat scan in 6 months.   # pain management/chronic pain- fentanyl 25 g every 72 hours and oxycodone every 4 hours. Feels this pain regimen is keeping his pain stable. continue current regimen. New prescription given.  # Chronic neuropathy- stable/ pain management.   # follow up in 6 months/ 6-8 week port flush/scan prior/labs- 1 week prior.

## 2016-08-02 NOTE — Progress Notes (Signed)
Patient is here for follow up, he is doing well 

## 2016-08-02 NOTE — Progress Notes (Signed)
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 Specimen integrity: Intact. Macroscopic tumor perforation: Not identified. Invasive tumor: Maximum size: 5.5 cm. Histologic type(s): Invasive adenocarcinoma. Histologic grade and differentiation: G1: well differentiated/low grade  Pathologic Staging: pT4b, pN1b, pM1b MSI stable K-ras wild-type not mutated by [employer] study. 2.Patient was started on FOLFOX in September of 2016,after 2 cycles of chemotherapy patient developed neuropathy. Patient had a previous neuropathy as a baseline which increased so was switched over to FOLFIRI I IN December of 2016 As patient has clear os wild-type we can proceed to add either a Avastin or cetuximab from the next chemotherapy.(November, 2016)  Avastin was added as abdominal wound has completely healed now Avastin on hold (December, 2016) because of hypertension Chemotherapy was discontinued after protocol 8 cycle because of significant side effect (last chemotherapy was on October 28, 2015).  # Hx of Bil Kidney stones  # sarcoidosis/ Lung- [[facility]]- on surveillance  # Neuropathy- lyrica -off. ? 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  male [place]  patient above history of metastatic colon cancer currently off therapy since febl 2017 because of intolerance/side effects is here for follow-up.   Patient's colostomy reversal is on hold because of his over all poor health/obesity. History of chronic intermittent abdominal pain. Not any worse.  Patient has chronic tingling and numbness of his feet. Also complains of tingling and numbness of his face [question neurosarcoidosis as per patient]. She is on chronic pain medication. Stable. No new weight loss. Appetite fair. No blood in stools or black stools.  REVIEW OF SYSTEMS:  A complete 10 point review of system is done which is negative except mentioned above/history of present illness.   PAST MEDICAL HISTORY :  Past Medical History:  Diagnosis Date  . Abdominal pain    mid abdomen  . Adiposity 03/21/2015  . Arthritis   . Besnier-Boeck disease ([place]) 12/21/2012  . Colon cancer ([place])   . Disorder of peripheral nervous system ([place]) 05/28/2014  . Extreme obesity ([place]) 12/21/2012  . GERD (gastroesophageal reflux disease)   . History of kidney stones   . History of transfusion   . Hypertension    has been off BP med x 6 months  . Iron deficiency anemia due to chronic blood loss 05/08/2015  . LBP (low back pain) 05/28/2014  . Neuropathy ([place])   . PONV (postoperative nausea and vomiting)   . Sarcoid ([place]) 10/2012  . Skin cancer    "it flairs up with sarcoidosis"    PAST SURGICAL HISTORY :   Past Surgical History:  Procedure Laterality Date  . CARDIAC CATHETERIZATION    . FLEXIBLE SIGMOIDOSCOPY N/A 05/07/2015   Procedure: FLEXIBLE SIGMOIDOSCOPY;  Surgeon: Laurence Spates, MD;  Location: [facility];  Service: Endoscopy;  Laterality: N/A;  . LAPAROSCOPIC PARTIAL COLECTOMY N/A 05/07/2015   Procedure: LAPAROSCOPIC ASSITED PARTIAL COLECTOMY WITH COLOSTOMY, SMALL BOWEL RESECTION, EXCISION OF PERITONEAL NODULE;  Surgeon: [name], MD;  Location: [facility];  Service: General;  Laterality: N/A;  .  PORTACATH PLACEMENT Right 05/27/2015   Procedure: INSERTION PORT-A-CATH;  Surgeon: [name], MD;  Location: [facility];  Service: General;  Laterality: Right;    FAMILY HISTORY :   Family History  Problem Relation Age of Onset  . Arthritis Mother   . Heart disease Mother   . Stroke Father   . Prostate cancer Neg Hx   . Bladder Cancer Neg Hx     SOCIAL HISTORY:   Social History  Substance Use Topics  . Smoking status: Passive Smoke Exposure - Never Smoker  . Smokeless tobacco: Never Used  . Alcohol use No    ALLERGIES:  is allergic to versed [midazolam]; gabapentin; and paba derivatives.  MEDICATIONS:  Current Outpatient Prescriptions  Medication Sig Dispense Refill  . fentaNYL (DURAGESIC - DOSED MCG/HR) 25 MCG/HR patch Place 1 patch (25 mcg total) onto the skin every 3 (three) days. 10 patch 0  . lisinopril (PRINIVIL,ZESTRIL) 10 MG tablet Take 10 mg by mouth daily.    Marland Kitchen oxyCODONE (OXY IR/ROXICODONE) 5 MG immediate release tablet Take 1 tablet (5 mg total) by mouth every 4 (four) hours as needed for severe pain. 50 tablet 0  . promethazine (PHENERGAN) 25 MG tablet Take 1 tablet (25 mg total) by mouth every 6 (six) hours as needed for nausea or vomiting. 30 tablet 1  . zolpidem (AMBIEN) 10 MG tablet Take 10-15 mg by mouth at bedtime as needed for sleep.    Marland Kitchen amLODipine (NORVASC) 10 MG tablet Take 1 tablet (10 mg total) by mouth daily. (Patient not taking: Reported on 08/02/2016) 30 tablet 3   Current Facility-Administered Medications  Medication Dose Route Frequency Provider Last Rate Last Dose  . iopamidol (ISOVUE-M) 41 % intrathecal injection 20 mL  20 mL Other Once PRN Molli Barrows, MD      . lactated ringers infusion 1,000 mL  1,000 mL Intravenous Continuous Molli Barrows, MD      . lidocaine (PF) (XYLOCAINE) 1 % injection 5 mL  5 mL Subcutaneous Once Molli Barrows, MD      . midazolam (VERSED) injection 5 mg  5 mg Intravenous Once Molli Barrows, MD      . ropivacaine  (PF) 2 mg/ml (0.2%) (NAROPIN) epidural 10 mL  10 mL Epidural Once Molli Barrows, MD      . sodium chloride flush (NS) 0.9 % injection 10 mL  10 mL Other Once Molli Barrows, MD       Facility-Administered Medications Ordered in Other Visits  Medication Dose Route Frequency Provider Last Rate Last Dose  . sodium chloride 0.9 % injection 10 mL  10 mL Intracatheter PRN Forest Gleason, MD   10 mL at 08/02/15 0955  . sodium chloride flush (NS) 0.9 % injection 10 mL  10 mL Intravenous PRN Forest Gleason, MD   10 mL at 02/11/16 1326  . sodium chloride flush (NS) 0.9 % injection 10 mL  10 mL Intravenous PRN Cammie Sickle, MD   10 mL at 08/02/16 1403    PHYSICAL EXAMINATION: ECOG PERFORMANCE STATUS: 0 - Asymptomatic  BP (!) 144/99 (BP Location: Left Arm, Patient Position: Sitting)   Pulse 88   Temp 97.5 F (36.4 C) (Tympanic)  Resp 18   Wt 296 lb (134.3 kg)   BMI 41.28 kg/m   Filed Weights   08/02/16 1441  Weight: 296 lb (134.3 kg)    GENERAL: Well-nourished well-developed; Alert, no distress and comfortable.  Obese. Accompanied by is wife.  EYES: no pallor or icterus OROPHARYNX: no thrush or ulceration; good dentition  NECK: supple, no masses felt LYMPH:  no palpable lymphadenopathy in the cervical, axillary or inguinal regions LUNGS: clear to auscultation and  No wheeze or crackles HEART/CVS: regular rate & rhythm and no murmurs; No lower extremity edema ABDOMEN:abdomen soft, non-tender and normal bowel sounds Musculoskeletal:no cyanosis of digits and no clubbing  PSYCH: alert & oriented x 3 with fluent speech NEURO: no focal motor/sensory deficits SKIN:  no rashes or significant lesions  LABORATORY DATA:  I have reviewed the data as listed    Component Value Date/Time   NA 140 08/02/2016 1354   NA 138 01/02/2015 1555   K 3.5 08/02/2016 1354   K 3.7 01/02/2015 1555   CL 107 08/02/2016 1354   CL 109 01/02/2015 1555   CO2 27 08/02/2016 1354   CO2 22 01/02/2015 1555    GLUCOSE 114 (H) 08/02/2016 1354   GLUCOSE 110 (H) 01/02/2015 1555   BUN 14 08/02/2016 1354   BUN 9 01/02/2015 1555   CREATININE 1.21 08/02/2016 1354   CREATININE 1.04 01/02/2015 1555   CALCIUM 9.0 08/02/2016 1354   CALCIUM 8.8 (L) 01/02/2015 1555   PROT 7.2 08/02/2016 1354   PROT 8.2 (H) 01/02/2015 1555   ALBUMIN 3.9 08/02/2016 1354   ALBUMIN 4.0 01/02/2015 1555   AST 22 08/02/2016 1354   AST 39 01/02/2015 1555   ALT 18 08/02/2016 1354   ALT 55 01/02/2015 1555   ALKPHOS 70 08/02/2016 1354   ALKPHOS 125 01/02/2015 1555   BILITOT 0.5 08/02/2016 1354   BILITOT 0.5 01/02/2015 1555   GFRNONAA >60 08/02/2016 1354   GFRNONAA >60 01/02/2015 1555   GFRAA >60 08/02/2016 1354   GFRAA >60 01/02/2015 1555    No results found for: SPEP, UPEP  Lab Results  Component Value Date   WBC 7.8 08/02/2016   NEUTROABS 5.1 08/02/2016   HGB 12.5 (L) 08/02/2016   HCT 37.6 (L) 08/02/2016   MCV 87.7 08/02/2016   PLT 234 08/02/2016      Chemistry      Component Value Date/Time   NA 140 08/02/2016 1354   NA 138 01/02/2015 1555   K 3.5 08/02/2016 1354   K 3.7 01/02/2015 1555   CL 107 08/02/2016 1354   CL 109 01/02/2015 1555   CO2 27 08/02/2016 1354   CO2 22 01/02/2015 1555   BUN 14 08/02/2016 1354   BUN 9 01/02/2015 1555   CREATININE 1.21 08/02/2016 1354   CREATININE 1.04 01/02/2015 1555      Component Value Date/Time   CALCIUM 9.0 08/02/2016 1354   CALCIUM 8.8 (L) 01/02/2015 1555   ALKPHOS 70 08/02/2016 1354   ALKPHOS 125 01/02/2015 1555   AST 22 08/02/2016 1354   AST 39 01/02/2015 1555   ALT 18 08/02/2016 1354   ALT 55 01/02/2015 1555   BILITOT 0.5 08/02/2016 1354   BILITOT 0.5 01/02/2015 1555       RADIOGRAPHIC STUDIES: I have personally reviewed the radiological images as listed and agreed with the findings in the report. No results found.   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 Clinically no evidence of progression. Colostomy reversal on HOLD. Repeat scan in 6 months.   # pain management/chronic pain- fentanyl 25 g every 72 hours and oxycodone every 4 hours. Feels this pain regimen is keeping his pain stable. continue current regimen. New prescription given.  # Chronic neuropathy- stable/ pain management.   # follow up in 6 months/ 6-8 week port flush/scan prior/labs- 1 week prior.    Orders Placed This Encounter  Procedures  . CT CHEST W CONTRAST    Standing Status:   Future    Standing Expiration Date:   10/02/2017    Order Specific Question:   Reason for Exam (SYMPTOM  OR DIAGNOSIS REQUIRED)    Answer:   colon cancer    Order Specific Question:   Preferred imaging location?    Answer:   [facility]  . CT ABDOMEN PELVIS W CONTRAST    Standing Status:   Future    Standing Expiration Date:   11/01/2017    Order Specific Question:   Reason for Exam (SYMPTOM  OR DIAGNOSIS REQUIRED)    Answer:   colon cancer    Order Specific Question:   Preferred imaging location?    Answer:   [facility]  . CBC with Differential    Standing Status:   Future    Standing Expiration Date:   08/02/2017  . Comprehensive metabolic panel    Standing Status:   Future    Standing Expiration Date:   08/02/2017  . CEA    Standing Status:   Future    Standing Expiration Date:   08/02/2017       Cammie Sickle, MD 08/02/2016 3:25 PM

## 2016-08-03 ENCOUNTER — Other Ambulatory Visit: Payer: Self-pay | Admitting: Internal Medicine

## 2016-08-03 DIAGNOSIS — C186 Malignant neoplasm of descending colon: Secondary | ICD-10-CM

## 2016-08-03 LAB — CEA: CEA: 13.3 ng/mL — ABNORMAL HIGH (ref 0.0–4.7)

## 2016-08-03 NOTE — Progress Notes (Signed)
Please inform patient that his CEA is slightly elevated at 13; recommend a PET scan/ please schedule that as soon as possible; follow-up appointment with me few Days after the PET scan. Thx

## 2016-08-04 ENCOUNTER — Telehealth: Payer: Self-pay | Admitting: *Deleted

## 2016-08-04 ENCOUNTER — Other Ambulatory Visit: Payer: Self-pay | Admitting: *Deleted

## 2016-08-04 DIAGNOSIS — C186 Malignant neoplasm of descending colon: Secondary | ICD-10-CM

## 2016-08-04 NOTE — Telephone Encounter (Signed)
Left msgs x 2 for pt to return my call. On pts home ph and cell.  Also called pts wife - Roxanne - left vm for wife trying to reach her husband to discuss test results.

## 2016-08-04 NOTE — Telephone Encounter (Signed)
Spoke with wife. Wife called back. Can only sch. Pet scan on 12/4 per wife due to her work sch. Pt already has an apt with pain mgmt that date and wife prefers pet be sch. This date if at all possible.  msg sent to sch. To arrange.

## 2016-08-04 NOTE — Telephone Encounter (Signed)
-----   Message from Cammie Sickle, MD sent at 08/03/2016  5:01 PM EST ----- PET ordered. Please inform pt- Thx

## 2016-08-05 ENCOUNTER — Inpatient Hospital Stay: Payer: BLUE CROSS/BLUE SHIELD

## 2016-08-07 ENCOUNTER — Inpatient Hospital Stay (HOSPITAL_COMMUNITY)
Admission: EM | Admit: 2016-08-07 | Discharge: 2016-08-18 | DRG: 330 | Disposition: A | Discharge status: Home / Self Care | Payer: BLUE CROSS/BLUE SHIELD | Attending: Internal Medicine | Admitting: Internal Medicine

## 2016-08-07 ENCOUNTER — Encounter (HOSPITAL_COMMUNITY): Payer: Self-pay

## 2016-08-07 ENCOUNTER — Emergency Department (HOSPITAL_COMMUNITY): Payer: BLUE CROSS/BLUE SHIELD

## 2016-08-07 ENCOUNTER — Inpatient Hospital Stay (HOSPITAL_COMMUNITY): Payer: BLUE CROSS/BLUE SHIELD

## 2016-08-07 DIAGNOSIS — M5431 Sciatica, right side: Secondary | ICD-10-CM | POA: Diagnosis present

## 2016-08-07 DIAGNOSIS — Z9221 Personal history of antineoplastic chemotherapy: Secondary | ICD-10-CM

## 2016-08-07 DIAGNOSIS — Z888 Allergy status to other drugs, medicaments and biological substances status: Secondary | ICD-10-CM

## 2016-08-07 DIAGNOSIS — Z8249 Family history of ischemic heart disease and other diseases of the circulatory system: Secondary | ICD-10-CM

## 2016-08-07 DIAGNOSIS — C784 Secondary malignant neoplasm of small intestine: Principal | ICD-10-CM | POA: Diagnosis present

## 2016-08-07 DIAGNOSIS — Z0189 Encounter for other specified special examinations: Secondary | ICD-10-CM

## 2016-08-07 DIAGNOSIS — F419 Anxiety disorder, unspecified: Secondary | ICD-10-CM | POA: Diagnosis present

## 2016-08-07 DIAGNOSIS — M5432 Sciatica, left side: Secondary | ICD-10-CM | POA: Diagnosis present

## 2016-08-07 DIAGNOSIS — C786 Secondary malignant neoplasm of retroperitoneum and peritoneum: Secondary | ICD-10-CM | POA: Diagnosis present

## 2016-08-07 DIAGNOSIS — I10 Essential (primary) hypertension: Secondary | ICD-10-CM | POA: Diagnosis present

## 2016-08-07 DIAGNOSIS — Z6839 Body mass index (BMI) 39.0-39.9, adult: Secondary | ICD-10-CM

## 2016-08-07 DIAGNOSIS — K429 Umbilical hernia without obstruction or gangrene: Secondary | ICD-10-CM | POA: Diagnosis present

## 2016-08-07 DIAGNOSIS — G629 Polyneuropathy, unspecified: Secondary | ICD-10-CM | POA: Diagnosis present

## 2016-08-07 DIAGNOSIS — G64 Other disorders of peripheral nervous system: Secondary | ICD-10-CM | POA: Diagnosis present

## 2016-08-07 DIAGNOSIS — K56609 Unspecified intestinal obstruction, unspecified as to partial versus complete obstruction: Secondary | ICD-10-CM | POA: Diagnosis present

## 2016-08-07 DIAGNOSIS — D72829 Elevated white blood cell count, unspecified: Secondary | ICD-10-CM

## 2016-08-07 DIAGNOSIS — G4700 Insomnia, unspecified: Secondary | ICD-10-CM | POA: Diagnosis present

## 2016-08-07 DIAGNOSIS — M7989 Other specified soft tissue disorders: Secondary | ICD-10-CM | POA: Diagnosis not present

## 2016-08-07 DIAGNOSIS — C186 Malignant neoplasm of descending colon: Secondary | ICD-10-CM

## 2016-08-07 DIAGNOSIS — Z933 Colostomy status: Secondary | ICD-10-CM | POA: Diagnosis not present

## 2016-08-07 DIAGNOSIS — M7121 Synovial cyst of popliteal space [Baker], right knee: Secondary | ICD-10-CM | POA: Diagnosis present

## 2016-08-07 DIAGNOSIS — K219 Gastro-esophageal reflux disease without esophagitis: Secondary | ICD-10-CM | POA: Diagnosis present

## 2016-08-07 DIAGNOSIS — Z79899 Other long term (current) drug therapy: Secondary | ICD-10-CM

## 2016-08-07 DIAGNOSIS — Z85038 Personal history of other malignant neoplasm of large intestine: Secondary | ICD-10-CM

## 2016-08-07 DIAGNOSIS — G893 Neoplasm related pain (acute) (chronic): Secondary | ICD-10-CM | POA: Diagnosis present

## 2016-08-07 DIAGNOSIS — Z66 Do not resuscitate: Secondary | ICD-10-CM | POA: Diagnosis present

## 2016-08-07 DIAGNOSIS — C772 Secondary and unspecified malignant neoplasm of intra-abdominal lymph nodes: Secondary | ICD-10-CM | POA: Diagnosis not present

## 2016-08-07 DIAGNOSIS — Z9049 Acquired absence of other specified parts of digestive tract: Secondary | ICD-10-CM | POA: Diagnosis not present

## 2016-08-07 DIAGNOSIS — Z85828 Personal history of other malignant neoplasm of skin: Secondary | ICD-10-CM

## 2016-08-07 DIAGNOSIS — D860 Sarcoidosis of lung: Secondary | ICD-10-CM | POA: Diagnosis present

## 2016-08-07 DIAGNOSIS — M545 Low back pain, unspecified: Secondary | ICD-10-CM | POA: Diagnosis present

## 2016-08-07 DIAGNOSIS — Z87442 Personal history of urinary calculi: Secondary | ICD-10-CM | POA: Diagnosis not present

## 2016-08-07 DIAGNOSIS — C762 Malignant neoplasm of abdomen: Secondary | ICD-10-CM | POA: Diagnosis not present

## 2016-08-07 DIAGNOSIS — Z7722 Contact with and (suspected) exposure to environmental tobacco smoke (acute) (chronic): Secondary | ICD-10-CM | POA: Diagnosis present

## 2016-08-07 LAB — CBC WITH DIFFERENTIAL/PLATELET
Basophils Absolute: 0 10*3/uL (ref 0.0–0.1)
Basophils Relative: 0 %
EOS ABS: 0 10*3/uL (ref 0.0–0.7)
EOS PCT: 0 %
HCT: 42.1 % (ref 39.0–52.0)
Hemoglobin: 14.2 g/dL (ref 13.0–17.0)
LYMPHS ABS: 1.5 10*3/uL (ref 0.7–4.0)
Lymphocytes Relative: 10 %
MCH: 29.6 pg (ref 26.0–34.0)
MCHC: 33.7 g/dL (ref 30.0–36.0)
MCV: 87.7 fL (ref 78.0–100.0)
Monocytes Absolute: 1.4 10*3/uL — ABNORMAL HIGH (ref 0.1–1.0)
Monocytes Relative: 10 %
Neutro Abs: 11.4 10*3/uL — ABNORMAL HIGH (ref 1.7–7.7)
Neutrophils Relative %: 80 %
PLATELETS: 290 10*3/uL (ref 150–400)
RBC: 4.8 MIL/uL (ref 4.22–5.81)
RDW: 13.6 % (ref 11.5–15.5)
WBC: 14.2 10*3/uL — AB (ref 4.0–10.5)

## 2016-08-07 LAB — COMPREHENSIVE METABOLIC PANEL
ALT: 18 U/L (ref 17–63)
ANION GAP: 10 (ref 5–15)
AST: 23 U/L (ref 15–41)
Albumin: 4.2 g/dL (ref 3.5–5.0)
Alkaline Phosphatase: 74 U/L (ref 38–126)
BUN: 12 mg/dL (ref 6–20)
CHLORIDE: 106 mmol/L (ref 101–111)
CO2: 23 mmol/L (ref 22–32)
CREATININE: 1.01 mg/dL (ref 0.61–1.24)
Calcium: 9.4 mg/dL (ref 8.9–10.3)
GFR calc non Af Amer: >60 mL/min (ref >60)
Glucose, Bld: 110 mg/dL — ABNORMAL HIGH (ref 65–99)
Potassium: 3.9 mmol/L (ref 3.5–5.1)
SODIUM: 139 mmol/L (ref 135–145)
Total Bilirubin: 1.3 mg/dL — ABNORMAL HIGH (ref 0.3–1.2)
Total Protein: 8 g/dL (ref 6.5–8.1)

## 2016-08-07 LAB — I-STAT CG4 LACTIC ACID, ED
LACTIC ACID, VENOUS: 1.3 mmol/L (ref 0.5–1.9)
Lactic Acid, Venous: 2.12 mmol/L (ref 0.5–1.9)

## 2016-08-07 LAB — GLUCOSE, CAPILLARY: Glucose-Capillary: 118 mg/dL — ABNORMAL HIGH (ref 65–99)

## 2016-08-07 MED ORDER — SODIUM CHLORIDE 0.9 % IV SOLN
INTRAVENOUS | Status: DC
  Administered 2016-08-07 – 2016-08-11 (×6): via INTRAVENOUS
  Administered 2016-08-12: 1000 mL via INTRAVENOUS
  Administered 2016-08-12 – 2016-08-13 (×2): via INTRAVENOUS
  Administered 2016-08-13: 1000 mL via INTRAVENOUS
  Administered 2016-08-13: 11:00:00 via INTRAVENOUS
  Administered 2016-08-13 – 2016-08-14 (×2): 1000 mL via INTRAVENOUS
  Administered 2016-08-15: 02:00:00 via INTRAVENOUS

## 2016-08-07 MED ORDER — ONDANSETRON HCL 4 MG/2ML IJ SOLN
4.0000 mg | Freq: Four times a day (QID) | INTRAMUSCULAR | Status: DC | PRN
  Administered 2016-08-07 – 2016-08-17 (×13): 4 mg via INTRAVENOUS
  Filled 2016-08-07 (×11): qty 2

## 2016-08-07 MED ORDER — METOPROLOL TARTRATE 5 MG/5ML IV SOLN: 5.0000 mg | Freq: Four times a day (QID) | INTRAVENOUS | Status: DC | PRN

## 2016-08-07 MED ORDER — ALUM & MAG HYDROXIDE-SIMETH 200-200-20 MG/5ML PO SUSP: 30.0000 mL | Freq: Four times a day (QID) | ORAL | Status: DC | PRN

## 2016-08-07 MED ORDER — MAGIC MOUTHWASH
15.0000 mL | Freq: Four times a day (QID) | ORAL | Status: DC | PRN
  Filled 2016-08-07: qty 15

## 2016-08-07 MED ORDER — HYDROMORPHONE HCL 1 MG/ML IJ SOLN
1.0000 mg | Freq: Once | INTRAMUSCULAR | Status: AC
  Administered 2016-08-07: 1 mg via INTRAVENOUS
  Filled 2016-08-07: qty 1

## 2016-08-07 MED ORDER — FENTANYL 25 MCG/HR TD PT72
25.0000 ug | MEDICATED_PATCH | TRANSDERMAL | Status: DC
  Administered 2016-08-09: 25 ug via TRANSDERMAL
  Filled 2016-08-07 (×2): qty 1

## 2016-08-07 MED ORDER — ACETAMINOPHEN 650 MG RE SUPP: 650.0000 mg | Freq: Four times a day (QID) | RECTAL | Status: DC | PRN

## 2016-08-07 MED ORDER — HYDROMORPHONE HCL 2 MG/ML IJ SOLN
2.0000 mg | Freq: Once | INTRAMUSCULAR | Status: AC
  Administered 2016-08-07: 2 mg via INTRAVENOUS

## 2016-08-07 MED ORDER — MENTHOL 3 MG MT LOZG
1.0000 | LOZENGE | OROMUCOSAL | Status: DC | PRN
  Filled 2016-08-07: qty 9

## 2016-08-07 MED ORDER — SODIUM CHLORIDE 0.9 % IV SOLN
8.0000 mg | Freq: Four times a day (QID) | INTRAVENOUS | Status: DC | PRN
  Filled 2016-08-07: qty 4

## 2016-08-07 MED ORDER — PROCHLORPERAZINE EDISYLATE 5 MG/ML IJ SOLN
5.0000 mg | INTRAMUSCULAR | Status: DC | PRN
  Administered 2016-08-08 (×2): 10 mg via INTRAVENOUS
  Filled 2016-08-07 (×2): qty 2

## 2016-08-07 MED ORDER — DIATRIZOATE MEGLUMINE & SODIUM 66-10 % PO SOLN
90.0000 mL | Freq: Once | ORAL | Status: DC
  Administered 2016-08-07: 90 mL via NASOGASTRIC
  Filled 2016-08-07: qty 90

## 2016-08-07 MED ORDER — FENTANYL 25 MCG/HR TD PT72: 25.0000 ug | MEDICATED_PATCH | TRANSDERMAL | Status: DC

## 2016-08-07 MED ORDER — LACTATED RINGERS IV BOLUS (SEPSIS)
1000.0000 mL | Freq: Once | INTRAVENOUS | Status: AC
  Administered 2016-08-07: 1000 mL via INTRAVENOUS

## 2016-08-07 MED ORDER — SODIUM CHLORIDE 0.9 % IJ SOLN
INTRAMUSCULAR | Status: AC
  Filled 2016-08-07: qty 50

## 2016-08-07 MED ORDER — LACTATED RINGERS IV BOLUS (SEPSIS): 1000.0000 mL | Freq: Three times a day (TID) | INTRAVENOUS | Status: AC | PRN

## 2016-08-07 MED ORDER — DIPHENHYDRAMINE HCL 50 MG/ML IJ SOLN: 12.5000 mg | Freq: Four times a day (QID) | INTRAMUSCULAR | Status: DC | PRN

## 2016-08-07 MED ORDER — LIP MEDEX EX OINT
1.0000 "application " | TOPICAL_OINTMENT | Freq: Two times a day (BID) | CUTANEOUS | Status: DC
  Administered 2016-08-07 – 2016-08-18 (×16): 1 via TOPICAL
  Filled 2016-08-07 (×3): qty 7

## 2016-08-07 MED ORDER — PHENOL 1.4 % MT LIQD
2.0000 | OROMUCOSAL | Status: DC | PRN
  Filled 2016-08-07: qty 177

## 2016-08-07 MED ORDER — LIDOCAINE HCL (PF) 1 % IJ SOLN
5.0000 mL | Freq: Once | INTRAMUSCULAR | Status: DC
  Filled 2016-08-07: qty 5

## 2016-08-07 MED ORDER — ALBUTEROL SULFATE (2.5 MG/3ML) 0.083% IN NEBU: 2.5000 mg | INHALATION_SOLUTION | Freq: Four times a day (QID) | RESPIRATORY_TRACT | Status: DC | PRN

## 2016-08-07 MED ORDER — ONDANSETRON HCL 4 MG/2ML IJ SOLN
4.0000 mg | Freq: Once | INTRAMUSCULAR | Status: AC
  Administered 2016-08-07: 4 mg via INTRAVENOUS
  Filled 2016-08-07: qty 2

## 2016-08-07 MED ORDER — HYDROMORPHONE HCL 2 MG/ML IJ SOLN
INTRAMUSCULAR | Status: AC
  Filled 2016-08-07: qty 1

## 2016-08-07 MED ORDER — METOCLOPRAMIDE HCL 5 MG/ML IJ SOLN: 5.0000 mg | Freq: Four times a day (QID) | INTRAMUSCULAR | Status: DC | PRN

## 2016-08-07 MED ORDER — HYDROMORPHONE HCL 1 MG/ML IJ SOLN
0.5000 mg | INTRAMUSCULAR | Status: DC | PRN
  Administered 2016-08-07 – 2016-08-08 (×10): 2 mg via INTRAVENOUS
  Administered 2016-08-09: 1 mg via INTRAVENOUS
  Administered 2016-08-09: 2 mg via INTRAVENOUS
  Administered 2016-08-09 (×2): 1 mg via INTRAVENOUS
  Administered 2016-08-09 (×2): 2 mg via INTRAVENOUS
  Administered 2016-08-09: 1 mg via INTRAVENOUS
  Administered 2016-08-09 (×2): 2 mg via INTRAVENOUS
  Administered 2016-08-09: 1 mg via INTRAVENOUS
  Administered 2016-08-10 (×4): 2 mg via INTRAVENOUS
  Administered 2016-08-10: 1 mg via INTRAVENOUS
  Filled 2016-08-07 (×5): qty 2
  Filled 2016-08-07: qty 1
  Filled 2016-08-07: qty 2
  Filled 2016-08-07: qty 1
  Filled 2016-08-07: qty 2
  Filled 2016-08-07: qty 1
  Filled 2016-08-07: qty 2
  Filled 2016-08-07: qty 1
  Filled 2016-08-07 (×4): qty 2
  Filled 2016-08-07: qty 1
  Filled 2016-08-07: qty 2
  Filled 2016-08-07: qty 1
  Filled 2016-08-07 (×2): qty 2
  Filled 2016-08-07: qty 1
  Filled 2016-08-07 (×2): qty 2
  Filled 2016-08-07: qty 1
  Filled 2016-08-07 (×2): qty 2

## 2016-08-07 MED ORDER — SODIUM CHLORIDE 0.9 % IV BOLUS (SEPSIS)
1000.0000 mL | Freq: Once | INTRAVENOUS | Status: AC
  Administered 2016-08-07: 1000 mL via INTRAVENOUS

## 2016-08-07 MED ORDER — IOPAMIDOL (ISOVUE-300) INJECTION 61%
INTRAVENOUS | Status: AC
  Filled 2016-08-07: qty 100

## 2016-08-07 MED ORDER — IOPAMIDOL (ISOVUE-300) INJECTION 61%
100.0000 mL | Freq: Once | INTRAVENOUS | Status: AC | PRN
  Administered 2016-08-07: 100 mL via INTRAVENOUS

## 2016-08-07 MED ORDER — METHOCARBAMOL 1000 MG/10ML IJ SOLN
1000.0000 mg | Freq: Four times a day (QID) | INTRAVENOUS | Status: DC | PRN
  Filled 2016-08-07: qty 10

## 2016-08-07 NOTE — ED Notes (Signed)
Lab delay -  Pt has a port and would like that accessed.

## 2016-08-07 NOTE — ED Provider Notes (Signed)
[facility] DEPT Provider Note   CSN: [id number] Arrival date & time: 08/07/16  1618     History   Chief Complaint Chief Complaint  Patient presents with  . Abdominal Pain    HPI [name] is a 55 y.o. male.  This patient with past medical history remarkable for stage IV colon cancer treated with colon resection and presents to the emergency room with chief complaint of generalized abdominal pain, nausea, vomiting. He also reports decreased output from his colostomy. He denies any fevers chills. He states that he has not had pain like this since they found that he had cancer. He is tried using patches at home with mild relief. He denies any other associated symptoms.   The history is provided by the patient. No language interpreter was used.    Past Medical History:  Diagnosis Date  . Abdominal pain    mid abdomen  . Adiposity 03/21/2015  . Arthritis   . Besnier-Boeck disease ([place]) 12/21/2012  . Colon cancer ([place])   . 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 Extreme obesity ([place]) 12/21/2012  . GERD (gastroesophageal reflux disease)   . History of kidney stones   . History of transfusion   . Hypertension    has been off BP med x 6 months  . Iron deficiency anemia due to chronic blood loss 05/08/2015  . LBP (low back pain) 05/28/2014  . Neuropathy ([place])   . PONV (postoperative nausea and vomiting)   . Sarcoid ([place]) 10/2012  . Skin cancer    "it flairs up with sarcoidosis"    Patient Active Problem List   Diagnosis Date Noted  . Cancer of descending colon w obstruction s/p colectomy/ostomy 05/07/2015 05/10/2015  . Iron deficiency anemia due to chronic blood loss 05/08/2015  . Large bowel obstruction 05/08/2015  . Morbid obesity ([place]) 05/07/2015  . Nausea and vomiting 05/07/2015  . Pulmonary sarcoidosis ([place])   . Abdominal pain, acute 05/05/2015  . Leukocytosis 05/05/2015  . Hypokalemia 05/05/2015  . HTN (hypertension) 03/21/2015  . Disorder of  peripheral nervous system ([place]) 05/28/2014  . LBP (low back pain) 05/28/2014  . 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 Dry eye 09/25/2013  . Besnier-Boeck disease ([place]) 12/21/2012  . Sarcoidosis ([place]) 12/21/2012    Past Surgical History:  Procedure Laterality Date  . CARDIAC CATHETERIZATION    . FLEXIBLE SIGMOIDOSCOPY N/A 05/07/2015   Procedure: FLEXIBLE SIGMOIDOSCOPY;  Surgeon: Laurence Spates, MD;  Location: [facility];  Service: Endoscopy;  Laterality: N/A;  . LAPAROSCOPIC PARTIAL COLECTOMY N/A 05/07/2015   Procedure: LAPAROSCOPIC ASSITED PARTIAL COLECTOMY WITH COLOSTOMY, SMALL BOWEL RESECTION, EXCISION OF PERITONEAL NODULE;  Surgeon: [name], MD;  Location: [facility] ORS;  Service: General;  Laterality: N/A;  . PORTACATH PLACEMENT Right 05/27/2015   Procedure: INSERTION PORT-A-CATH;  Surgeon: [name], MD;  Location: [facility];  Service: General;  Laterality: Right;       Home Medications    Prior to Admission medications   Medication Sig Start Date End Date Taking? Authorizing Provider  amLODipine (NORVASC) 10 MG tablet Take 1 tablet (10 mg total) by mouth daily. Patient not taking: Reported on 08/02/2016 09/25/15   Forest Gleason, MD  fentaNYL (DURAGESIC - DOSED MCG/HR) 25 MCG/HR patch Place 1 patch (25 mcg total) onto the skin every 3 (three) days. 07/26/16   Cammie Sickle, MD  lisinopril (PRINIVIL,ZESTRIL) 10 MG tablet Take 10 mg by mouth daily.    Historical Provider, MD  oxyCODONE (OXY  IR/ROXICODONE) 5 MG immediate release tablet Take 1 tablet (5 mg total) by mouth every 4 (four) hours as needed for severe pain. 08/02/16   Cammie Sickle, MD  promethazine (PHENERGAN) 25 MG tablet Take 1 tablet (25 mg total) by mouth every 6 (six) hours as needed for nausea or vomiting. 06/24/16   Cammie Sickle, MD  zolpidem (AMBIEN) 10 MG tablet Take 10-15 mg by mouth at bedtime as needed for sleep.    Historical Provider, MD    Family History Family History    Problem Relation Age of Onset  . Arthritis Mother   . Heart disease Mother   . Stroke Father   . Prostate cancer Neg Hx   . Bladder Cancer Neg Hx     Social History Social History  Substance Use Topics  . Smoking status: Passive Smoke Exposure - Never Smoker  . Smokeless tobacco: Never Used  . Alcohol use No     Allergies   Versed [midazolam]; Gabapentin; and Paba derivatives   Review of Systems Review of Systems  All other systems reviewed and are negative.    Physical Exam Updated Vital Signs BP 144/95   Pulse 86   Temp 98.1 F (36.7 C)   Resp 20   Ht 5\' 11"  (1.803 m)   Wt 127 kg   SpO2 98%   BMI 39.05 kg/m   Physical Exam  Constitutional: He is oriented to person, place, and time. He appears well-developed and well-nourished.  HENT:  Head: Normocephalic and atraumatic.  Eyes: Conjunctivae and EOM are normal. Pupils are equal, round, and reactive to light. Right eye exhibits no discharge. Left eye exhibits no discharge. No scleral icterus.  Neck: Normal range of motion. Neck supple. No JVD present.  Cardiovascular: Normal rate, regular rhythm and normal heart sounds.  Exam reveals no gallop and no friction rub.   No murmur heard. Pulmonary/Chest: Effort normal and breath sounds normal. No respiratory distress. He has no wheezes. He has no rales. He exhibits no tenderness.  Abdominal: Soft. He exhibits no distension and no mass. There is tenderness. There is no rebound and no guarding.  Diffuse abdominal tenderness to palpation Colostomy with minimal output in LLQ  Musculoskeletal: Normal range of motion. He exhibits no edema or tenderness.  Neurological: He is alert and oriented to person, place, and time.  Skin: Skin is warm and dry.  Psychiatric: He has a normal mood and affect. His behavior is normal. Judgment and thought content normal.  Nursing note and vitals reviewed.    ED Treatments / Results  Labs (all labs ordered are listed, but only  abnormal results are displayed) Labs Reviewed  CBC WITH DIFFERENTIAL/PLATELET - Abnormal; Notable for the following:       Result Value   WBC 14.2 (*)    Neutro Abs 11.4 (*)    Monocytes Absolute 1.4 (*)    All other components within normal limits  COMPREHENSIVE METABOLIC PANEL - Abnormal; Notable for the following:    Glucose, Bld 110 (*)    Total Bilirubin 1.3 (*)    All other components within normal limits  GLUCOSE, CAPILLARY - Abnormal; Notable for the following:    Glucose-Capillary 118 (*)    All other components within normal limits  I-STAT CG4 LACTIC ACID, ED - Abnormal; Notable for the following:    Lactic Acid, Venous 2.12 (*)    All other components within normal limits  CBC  BASIC METABOLIC PANEL  I-STAT CG4 LACTIC ACID,  ED    EKG  EKG Interpretation None       Radiology Ct Abdomen Pelvis W Contrast  Result Date: 08/07/2016 CLINICAL DATA:  Abdominal pain, vomiting, colon cancer, colostomy in place EXAM: CT ABDOMEN AND PELVIS WITH CONTRAST TECHNIQUE: Multidetector CT imaging of the abdomen and pelvis was performed using the standard protocol following bolus administration of intravenous contrast. CONTRAST:  141mL ISOVUE-300 IOPAMIDOL (ISOVUE-300) INJECTION 61% COMPARISON:  03/22/2016 FINDINGS: Lower chest: Lung bases shows no acute findings. Small hiatal hernia. Hepatobiliary: There is subtle low-density lesion in right hepatic lobe medially just posterior to IVC measures 1.1 cm. Metastatic disease cannot be excluded. Question second hepatic lesion in right hepatic lobe measures 7 mm. Further correlation with MRI is recommended to exclude metastatic disease. Pancreas: Mild atrophic pancreas without focal abnormality. Spleen: Enhanced spleen is unremarkable. Adrenals/Urinary Tract: No adrenal gland mass. Kidneys are symmetrical in size. Bilateral nonobstructive nephrolithiasis. No hydronephrosis. There is nonobstructive calcified calculus in proximal right ureter  measures 4.5 mm. No calcified calculi are noted within urinary bladder. Stomach/Bowel: There is no gastric outlet obstruction. Again noted postsurgical changes with anastomosis in small bowel in right upper abdomen. There is dilatation of proximal small bowel and mid small bowel with some fluid and air-fluid levels. A colostomy is noted in left abdomen. Axial image fifty-eight there is transition point in caliber of small bowel in left abdomen just inferior to level of colostomy. Non distended small bowel loops are noted in left lower quadrant. Findings are consistent with complete small bowel obstruction. Findings may be due to adhesion or neoplastic involvement as there is a soft tissue density adjacent to small bowel loops left anterior fascia axial image 60. Metastatic disease cannot be excluded. Again noted a right paramedian ventral hernia containing omental fat without evidence of acute complication. There is a midline ventral hernia containing some omental fat without evidence of acute complication. Please note there is irregular soft tissue density within hernia measures 2.8 cm axial image 41. Metastatic seeding cannot be excluded. A second nodule within hernia axial image 49 measures 8 mm metastatic disease cannot be excluded. The colon entering the colostomy is totally decompressed. There is some residual stool within cecum. The transverse colon is decompressed. Vascular/Lymphatic:  No aortic aneurysm. Reproductive: Prostate gland and seminal vesicles are unremarkable. Other: No ascites or free abdominal air. Musculoskeletal: No destructive bony lesions are noted. Sagittal images of the spine shows mild degenerative changes thoracolumbar spine. IMPRESSION: 1. Findings consistent with small bowel obstruction with transition point in caliber of small bowel. There is total decompression of distal small bowel loops. Findings may be due to adhesion or neoplastic involvement as there is a soft tissue density in  axial image 60 left anterior mesentery/fascia measures about 6.5 cm. 2. There is a ventral hernia in right mid abdomen. There is soft tissue density within hernia measures 2.8 cm. Metastatic seeding cannot be excluded. Clinical correlation is necessary. 3. A colostomy is noted in left abdomen. There is decompression of the distal colon entering the colostomy. 4. No evidence of ascites or free abdominal air. 5. Question subtle low-density lesions within liver. Further correlation with MRI is recommended to exclude metastatic disease. 6. Small hiatal hernia. These results were called by telephone at the time of interpretation on 08/07/2016 at 8:30 pm to Dr. Montine Circle , who verbally acknowledged these results. Electronically Signed   By: Lahoma Crocker M.D.   On: 08/07/2016 20:31   Dg Abd Portable 1v-small Bowel Protocol-position Verification  Result Date: 08/07/2016 CLINICAL DATA:  Status post enteric tube placement. EXAM: PORTABLE ABDOMEN - 1 VIEW COMPARISON:  CT abdomen 08/07/2016. FINDINGS: Enteric tube tip projects over the left upper quadrant. Prominent loops of small bowel demonstrated throughout the abdomen. Contrast material within the right renal collecting system. IMPRESSION: Enteric tube tip projects over the left upper quadrant, likely within the stomach. Electronically Signed   By: Lovey Newcomer M.D.   On: 08/07/2016 22:50    Procedures Procedures (including critical care time)  Medications Ordered in ED Medications  HYDROmorphone (DILAUDID) injection 1 mg (not administered)  ondansetron (ZOFRAN) injection 4 mg (not administered)  sodium chloride 0.9 % bolus 1,000 mL (not administered)     Initial Impression / Assessment and Plan / ED Course  I have reviewed the triage vital signs and the nursing notes.  Pertinent labs & imaging results that were available during my care of the patient were reviewed by me and considered in my medical decision making (see chart for  details).  Clinical Course     Patient with history of stage IV colon cancer with metastases. Presents with abdominal pain, nausea, and vomiting.  CT scan is consistent with complete small bowel obstruction, presumably secondary to metastases. There appears to also be metastatic seeding of the abdominal wall ventral hernia.  Patient's vital signs are stable. His pain is controlled. Discussed Dr. Ralene Bathe, who recommends consultation with general surgery and admission to the hospitalist.  Discussed patient with Dr. Johney Maine from general surgery, who states that he like the patient evaluated by internal medicine and oncology prior to consultation. He is available for consultation if this is deemed appropriate.  Appreciate Dr. Eulas Post for admitting the patient to the hospital.  Final Clinical Impressions(s) / ED Diagnoses   Final diagnoses:  SBO (small bowel obstruction)  2. Metastatic colon cancer  New Prescriptions Current Discharge Medication List       Montine Circle, PA-C 08/08/16 0033    Quintella Reichert, MD 08/08/16 1556

## 2016-08-07 NOTE — ED Notes (Signed)
Dr. Lily Kocher, hospitalist, at bedside.

## 2016-08-07 NOTE — ED Triage Notes (Signed)
Pt presents via EMS from home. Pt has stage 4 colon cancer, colostomy in place. Pt has not eaten anything in the last 36 hours. Pt does have fentanyl patches at home for pain. 168/86, temp 98.1.

## 2016-08-07 NOTE — ED Notes (Signed)
Bed: WA09 Expected date:  Expected time:  Means of arrival:  Comments: abd pain 

## 2016-08-07 NOTE — Consult Note (Signed)
[name]  [address] [place], [place] [address] Phone: [phone] FAX: [phone]     [name]  01-03-1961 [id number]  CARE TEAM:  PCP: [name] Pink, MD  Outpatient Care Team: Patient Care Team: [name] Pink, MD as PCP - General (Family Medicine) [name] Confer, MD as Consulting Physician (General Surgery) [name] Sickle, MD as Consulting Physician (Oncology) [name] Barrows, MD as Consulting Physician (Pain Medicine) [name] Aloe, MD as Consulting Physician (Urology) [name] Lame, MD as Consulting Physician (Gastroenterology) [name] Spates, MD as Consulting Physician (Gastroenterology)  Inpatient Treatment Team: Treatment Team: Attending Provider: Quintella Reichert, MD; Physician Assistant: [name] Circle, PA-C; Technician: [name], NT; Registered Nurse: [name] Rocher, RN; Registered Nurse: [name] Racer, RN; Consulting Physician: [name] Nations, MD   This patient is a 55 y.o.male who presents today for surgical evaluation at the request of [name] for Dr [name], [place] [facility].   Reason for evaluation: Bowel obstruction in a patient with stage IV colon cancer.  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  History of recurrent colon perforations initially felt to be diverticular.  Then progressed to colonic obstruction.  Underwent left-sided colectomy with end colostomy 24 August, 2016 by my partner at [facility], Dr. Jackolyn Campos.   Lymph node positive.  Started on FOLFOX.  Then transitioned over to Avastin-based therapy.  Had to stop due to neuropathy and other side effects.  Last dose 14 February, 2017.  Last seen by [name] with medical oncology over at [facility] earlier this week.  Still with some intermittent chronic abdominal pain but no nausea or vomiting.  Haptic colostomy.  On chronic proton pain medication for low back pain, chronic  tingling numbness of the face and feet.  CEA elevated.  PET scan scheduled.    Three days later arrives emergency services to the ER due to intolerance to eating or drinking for 36 hours.   Dehydrated.  CT scan supicious for high grade bowel obstruction.  Distinct transition zone at a mass intraperitoneal just medial to the colostomy.   No small bowel within the colostomy.  No obvious parastomal hernia. Suspicious for recurrent focal carcinomatosis/metastatic disease.  Medical admission requested.  Medicine requested surgical consultation.  Oncology has not been called.  Do not have a knowledge of the patient is palliative care only or continue aggressive intervention.    Assessment  [name]  55 y.o. 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  Plan:  Rehydrate.  Agree with admission.  Small bowel protocol with nasogastric tube, Gastrografin, serial x-rays.  Get advice from medical oncology to see how aggressive intervention should be.  Are any chemotherapy options he can tolerate?  What is this patient's prognosis without any intervention?  Does the patient have any goals/limits to care.    Would not rush to operate in this patient.  try to optimize first.  Increased risk of hernias and fistulas with carcinomatosis, but it is focal so may be an operative candidate this admission.  We definitely want input from medical oncology and get sense of goals of care with this patient   Another option would  be consider evaluation over at [facility] for possible benefit from exploratory laparotomy with intraperitoneal chemotherapy.  Patient may be too deconditioned to tolerate that however.  -VTE  prophylaxis- SCDs, etc  -mobilize as tolerated to help recovery    [name], M.D., F.A.C.S. Gastrointestinal and Minimally Invasive Surgery [facility] [address], [place], [place] [address] [phone] Main / Paging   08/07/2016      Past Medical History:  Diagnosis Date  . Abdominal pain    mid abdomen  . Adiposity 03/21/2015  . Arthritis   . Besnier-Boeck disease ([place]) 12/21/2012  . Colon cancer ([place])   . 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 Extreme obesity ([place]) 12/21/2012  . GERD (gastroesophageal reflux disease)   . History of kidney stones   . History of transfusion   . Hypertension    has been off BP med x 6 months  . Iron deficiency anemia due to chronic blood loss 05/08/2015  . LBP (low back pain) 05/28/2014  . Neuropathy ([place])   . PONV (postoperative nausea and vomiting)   . Sarcoid ([place]) 10/2012  . Skin cancer    "it flairs up with sarcoidosis"    Past Surgical History:  Procedure Laterality Date  . CARDIAC CATHETERIZATION    . FLEXIBLE SIGMOIDOSCOPY N/A 05/07/2015   Procedure: FLEXIBLE SIGMOIDOSCOPY;  Surgeon: [name] Spates, MD;  Location: [facility] ENDOSCOPY;  Service: Endoscopy;  Laterality: N/A;  . LAPAROSCOPIC PARTIAL COLECTOMY N/A 05/07/2015   Procedure: LAPAROSCOPIC ASSITED PARTIAL COLECTOMY WITH COLOSTOMY, SMALL BOWEL RESECTION, EXCISION OF PERITONEAL NODULE;  Surgeon: [name] Confer, MD;  Location: [facility] ORS;  Service: General;  Laterality: N/A;  . PORTACATH PLACEMENT Right 05/27/2015   Procedure: INSERTION PORT-A-CATH;  Surgeon: [name] Confer, MD;  Location: [facility] ORS;  Service: General;  Laterality: Right;    Social History   Social History  . Marital status: Married    Spouse name: N/A  . Number of children: N/A  . Years of education: N/A   Occupational History  . Not on file.   Social History Main Topics  . Smoking status: Passive Smoke Exposure - Never Smoker  . Smokeless  tobacco: Never Used  . Alcohol use No  . Drug use: No  . Sexual activity: Not on file   Other Topics Concern  . Not on file   Social History Narrative  . No narrative on file    Family History  Problem Relation Age of Onset  . Arthritis Mother   . Heart disease Mother   . Stroke Father   . Prostate cancer Neg Hx   . Bladder Cancer Neg Hx     Current Facility-Administered Medications  Medication Dose Route Frequency Provider Last Rate Last Dose  . acetaminophen (TYLENOL) suppository 650 mg  650 mg Rectal Q6H PRN Michael Boston, MD      . albuterol (PROVENTIL) (2.5 MG/3ML) 0.083% nebulizer solution 2.5 mg  2.5 mg Nebulization Q6H PRN Michael Boston, MD      . alum & mag hydroxide-simeth (MAALOX/MYLANTA) 200-200-20 MG/5ML suspension 30 mL  30 mL Oral Q6H PRN Michael Boston, MD      . diatrizoate meglumine-sodium (GASTROGRAFIN) 66-10 % solution 90 mL  90 mL Per NG tube Once Michael Boston, MD      . diphenhydrAMINE (BENADRYL) injection 12.5-25 mg  12.5-25 mg Intravenous Q6H PRN Michael Boston, MD      . fentaNYL ([place] - dosed mcg/hr) patch 25 mcg  25 mcg Transdermal Q72H Michael Boston, MD      . HYDROmorphone (DILAUDID) injection 0.5-2 mg  0.5-2 mg Intravenous Q2H PRN Michael Boston, MD      . HYDROmorphone (DILAUDID) injection 2 mg  2 mg Intravenous Once Lily Kocher, MD      . iopamidol (ISOVUE-300) 61 % injection           . iopamidol (ISOVUE-M) 41 % intrathecal injection 20 mL  20 mL Other Once PRN [name] Barrows, MD      . lactated ringers bolus 1,000 mL  1,000 mL Intravenous Once Michael Boston, MD      . lactated ringers bolus 1,000 mL  1,000 mL Intravenous Q8H PRN Michael Boston, MD      . lactated ringers infusion 1,000 mL  1,000 mL Intravenous Continuous [name] Barrows, MD      . lidocaine (PF) (XYLOCAINE) 1 % injection 5 mL  5 mL Subcutaneous Once [name] Barrows, MD      . lidocaine (PF) (XYLOCAINE) 1 % injection 5 mL  5 mL Subcutaneous Once Michael Boston, MD      . lip balm (CARMEX)  ointment 1 application  1 application Topical BID Michael Boston, MD      . magic mouthwash  15 mL Oral QID PRN Michael Boston, MD      . menthol-cetylpyridinium (CEPACOL) lozenge 3 mg  1 lozenge Oral PRN Michael Boston, MD      . methocarbamol (ROBAXIN) 1,000 mg in dextrose 5 % 50 mL IVPB  1,000 mg Intravenous Q6H PRN Michael Boston, MD      . metoCLOPramide (REGLAN) injection 5-10 mg  5-10 mg Intravenous Q6H PRN Michael Boston, MD      . metoprolol (LOPRESSOR) injection 5 mg  5 mg Intravenous Q6H PRN Michael Boston, MD      . midazolam (VERSED) injection 5 mg  5 mg Intravenous Once [name] Barrows, MD      . ondansetron [facility]) injection 4 mg  4 mg Intravenous Q6H PRN Michael Boston, MD       Or  . ondansetron (ZOFRAN) 8 mg in sodium chloride 0.9 % 50 mL IVPB  8 mg Intravenous Q6H PRN Michael Boston, MD      . phenol (CHLORASEPTIC) mouth spray 2 spray  2 spray Mouth/Throat PRN Michael Boston, MD      . prochlorperazine (COMPAZINE) injection 5-10 mg  5-10 mg Intravenous Q4H PRN Michael Boston, MD      . ropivacaine (PF) 2 mg/ml (0.2%) (NAROPIN) epidural 10 mL  10 mL Epidural Once [name] Barrows, MD      . sodium chloride 0.9 % injection           . sodium chloride flush (NS) 0.9 % injection 10 mL  10 mL Other Once [name] Barrows, MD       Current Outpatient Prescriptions  Medication Sig Dispense Refill  . fentaNYL (DURAGESIC - DOSED MCG/HR) 25 MCG/HR patch Place 1 patch (25 mcg total) onto the skin every 3 (three) days. 10 patch 0  . lisinopril (PRINIVIL,ZESTRIL) 10 MG tablet Take 10 mg by mouth daily.    Marland Kitchen oxyCODONE (OXY IR/ROXICODONE) 5 MG immediate release tablet Take 1 tablet (5 mg total) by mouth every 4 (four) hours as needed for severe pain. 50 tablet 0  . promethazine (PHENERGAN) 25 MG tablet Take 1 tablet (25 mg total) by mouth every 6 (six) hours as needed for nausea or vomiting. 30 tablet 1  .  zolpidem (AMBIEN) 10 MG tablet Take 10-15 mg by mouth at bedtime as needed for sleep.    Marland Kitchen amLODipine (NORVASC)  10 MG tablet Take 1 tablet (10 mg total) by mouth daily. (Patient not taking: Reported on 08/07/2016) 30 tablet 3   Facility-Administered Medications Ordered in Other Encounters  Medication Dose Route Frequency Provider Last Rate Last Dose  . sodium chloride 0.9 % injection 10 mL  10 mL Intracatheter PRN Forest Gleason, MD   10 mL at 08/02/15 0955  . sodium chloride flush (NS) 0.9 % injection 10 mL  10 mL Intravenous PRN Forest Gleason, MD   10 mL at 02/11/16 1326     Allergies  Allergen Reactions  . Versed [Midazolam] Nausea And Vomiting  . Gabapentin Other (See Comments)    Swallowing problems  . Paba Derivatives Nausea And Vomiting    Pt states he is allergic to unknown anesthesia. Pt has nausea and vomiting with anesthesia.     ROS:  AS PER HPI, and...  Constitutional:  No fevers, chills, sweats.  Weight stable Eyes:  No vision changes, No discharge HENT:  No sore throats, nasal drainage Lymph: No neck swelling, No bruising easily Pulmonary:  No cough, productive sputum CV: No orthopnea, PND  Patient walks 20 minutes without difficulty.  No exertional chest/neck/shoulder/arm pain. GI:  No personal nor family history of inflammatory bowel disease, irritable bowel syndrome, allergy such as Celiac Sprue, dietary/dairy problems, colitis, ulcers nor gastritis.  No recent sick contacts/gastroenteritis.  No travel outside the country.  No changes in diet. Renal: No UTIs, No hematuria Genital:  No drainage, bleeding, masses Musculoskeletal: No severe joint pain.  Good ROM major joints Skin:  No sores or lesions.  No rashes Heme/Lymph:  No easy bleeding.  No swollen lymph nodes Neuro: No focal weakness/numbness.  No seizures.  Does have some intermittent appears tedious pain and numbness on feet and occasionally face Psych: No suicidal ideation.  No hallucinations  BP 138/87   Pulse 85   Temp 98.1 F (36.7 C)   Resp 18   Ht 5' 11"  (1.803 m)   Wt 127 kg (280 lb)   SpO2 98%   BMI 39.05  kg/m   Physical Exam: General: Pt awake/alert/oriented x4 in moderate acute distress Eyes: PERRL, normal EOM. Sclera nonicteric Neuro: CN II-XII intact w/o focal sensory/motor deficits. Lymph: No head/neck/groin lymphadenopathy Psych:  No delerium/psychosis/paranoia HENT: Normocephalic, Mucus membranes moist.  No thrush Neck: Supple, No tracheal deviation Chest: No pain.  Good respiratory excursion. CV:  Pulses intact.   Regular rhythm Abdomen: Somewhat firm.  Moderately distended.  Sensitive but soft and reducible supraumbilical and umbilical hernias.  Colostomy left lower quadrant and scant stool and no gas in bag.  Discomfort but no peritonitis.   Gen:  No inguinal hernias.  No inguinal lymphadenopathy.   Ext:  SCDs BLE.  No significant edema.  No cyanosis Skin: No petechiae / purpurea.  No major sores Musculoskeletal: No severe joint pain.  Good ROM major joints   Results:   Labs: Results for orders placed or performed during the hospital encounter of 08/07/16 (from the past 48 hour(s))  CBC with Differential/Platelet     Status: Abnormal   Collection Time: 08/07/16  6:00 PM  Result Value Ref Range   WBC 14.2 (H) 4.0 - 10.5 K/uL   RBC 4.80 4.22 - 5.81 MIL/uL   Hemoglobin 14.2 13.0 - 17.0 g/dL   HCT 42.1 39.0 - 52.0 %   MCV 87.7  78.0 - 100.0 fL   MCH 29.6 26.0 - 34.0 pg   MCHC 33.7 30.0 - 36.0 g/dL   RDW 13.6 11.5 - 15.5 %   Platelets 290 150 - 400 K/uL   Neutrophils Relative % 80 %   Neutro Abs 11.4 (H) 1.7 - 7.7 K/uL   Lymphocytes Relative 10 %   Lymphs Abs 1.5 0.7 - 4.0 K/uL   Monocytes Relative 10 %   Monocytes Absolute 1.4 (H) 0.1 - 1.0 K/uL   Eosinophils Relative 0 %   Eosinophils Absolute 0.0 0.0 - 0.7 K/uL   Basophils Relative 0 %   Basophils Absolute 0.0 0.0 - 0.1 K/uL  Comprehensive metabolic panel     Status: Abnormal   Collection Time: 08/07/16  6:00 PM  Result Value Ref Range   Sodium 139 135 - 145 mmol/L   Potassium 3.9 3.5 - 5.1 mmol/L   Chloride  106 101 - 111 mmol/L   CO2 23 22 - 32 mmol/L   Glucose, Bld 110 (H) 65 - 99 mg/dL   BUN 12 6 - 20 mg/dL   Creatinine, Ser 1.01 0.61 - 1.24 mg/dL   Calcium 9.4 8.9 - 10.3 mg/dL   Total Protein 8.0 6.5 - 8.1 g/dL   Albumin 4.2 3.5 - 5.0 g/dL   AST 23 15 - 41 U/L   ALT 18 17 - 63 U/L   Alkaline Phosphatase 74 38 - 126 U/L   Total Bilirubin 1.3 (H) 0.3 - 1.2 mg/dL   GFR calc non Af Amer >60 >60 mL/min   GFR calc Af Amer >60 >60 mL/min    Comment: (NOTE) The eGFR has been calculated using the CKD EPI equation. This calculation has not been validated in all clinical situations. eGFR's persistently <60 mL/min signify possible Chronic Kidney Disease.    Anion gap 10 5 - 15  I-Stat CG4 Lactic Acid, ED     Status: Abnormal   Collection Time: 08/07/16  6:09 PM  Result Value Ref Range   Lactic Acid, Venous 2.12 (HH) 0.5 - 1.9 mmol/L   Comment NOTIFIED PHYSICIAN   I-Stat CG4 Lactic Acid, ED     Status: None   Collection Time: 08/07/16  8:30 PM  Result Value Ref Range   Lactic Acid, Venous 1.30 0.5 - 1.9 mmol/L    Imaging / Studies: Ct Abdomen Pelvis W Contrast  Result Date: 08/07/2016 CLINICAL DATA:  Abdominal pain, vomiting, colon cancer, colostomy in place EXAM: CT ABDOMEN AND PELVIS WITH CONTRAST TECHNIQUE: Multidetector CT imaging of the abdomen and pelvis was performed using the standard protocol following bolus administration of intravenous contrast. CONTRAST:  152m ISOVUE-300 IOPAMIDOL (ISOVUE-300) INJECTION 61% COMPARISON:  03/22/2016 FINDINGS: Lower chest: Lung bases shows no acute findings. Small hiatal hernia. Hepatobiliary: There is subtle low-density lesion in right hepatic lobe medially just posterior to IVC measures 1.1 cm. Metastatic disease cannot be excluded. Question second hepatic lesion in right hepatic lobe measures 7 mm. Further correlation with MRI is recommended to exclude metastatic disease. Pancreas: Mild atrophic pancreas without focal abnormality. Spleen: Enhanced  spleen is unremarkable. Adrenals/Urinary Tract: No adrenal gland mass. Kidneys are symmetrical in size. Bilateral nonobstructive nephrolithiasis. No hydronephrosis. There is nonobstructive calcified calculus in proximal right ureter measures 4.5 mm. No calcified calculi are noted within urinary bladder. Stomach/Bowel: There is no gastric outlet obstruction. Again noted postsurgical changes with anastomosis in small bowel in right upper abdomen. There is dilatation of proximal small bowel and mid small bowel with some fluid  and air-fluid levels. A colostomy is noted in left abdomen. Axial image fifty-eight there is transition point in caliber of small bowel in left abdomen just inferior to level of colostomy. Non distended small bowel loops are noted in left lower quadrant. Findings are consistent with complete small bowel obstruction. Findings may be due to adhesion or neoplastic involvement as there is a soft tissue density adjacent to small bowel loops left anterior fascia axial image 60. Metastatic disease cannot be excluded. Again noted a right paramedian ventral hernia containing omental fat without evidence of acute complication. There is a midline ventral hernia containing some omental fat without evidence of acute complication. Please note there is irregular soft tissue density within hernia measures 2.8 cm axial image 41. Metastatic seeding cannot be excluded. A second nodule within hernia axial image 49 measures 8 mm metastatic disease cannot be excluded. The colon entering the colostomy is totally decompressed. There is some residual stool within cecum. The transverse colon is decompressed. Vascular/Lymphatic:  No aortic aneurysm. Reproductive: Prostate gland and seminal vesicles are unremarkable. Other: No ascites or free abdominal air. Musculoskeletal: No destructive bony lesions are noted. Sagittal images of the spine shows mild degenerative changes thoracolumbar spine. IMPRESSION: 1. Findings  consistent with small bowel obstruction with transition point in caliber of small bowel. There is total decompression of distal small bowel loops. Findings may be due to adhesion or neoplastic involvement as there is a soft tissue density in axial image 60 left anterior mesentery/fascia measures about 6.5 cm. 2. There is a ventral hernia in right mid abdomen. There is soft tissue density within hernia measures 2.8 cm. Metastatic seeding cannot be excluded. Clinical correlation is necessary. 3. A colostomy is noted in left abdomen. There is decompression of the distal colon entering the colostomy. 4. No evidence of ascites or free abdominal air. 5. Question subtle low-density lesions within liver. Further correlation with MRI is recommended to exclude metastatic disease. 6. Small hiatal hernia. These results were called by telephone at the time of interpretation on 08/07/2016 at 8:30 pm to Dr. Montine Campos , who verbally acknowledged these results. Electronically Signed   By: Lahoma Crocker M.D.   On: 08/07/2016 20:31    Medications / Allergies: per chart  Antibiotics: Anti-infectives    None        Note: Portions of this report may have been transcribed using voice recognition software. Every effort was made to ensure accuracy; however, inadvertent computerized transcription errors may be present.   Any transcriptional errors that result from this process are unintentional.    [name], M.D., F.A.C.S. Gastrointestinal and Minimally Invasive Surgery [facility] [address], [place], [place] [address] [phone] Main / Paging   08/07/2016

## 2016-08-07 NOTE — H&P (Signed)
History and Physical    [name] [id number] DOB: 1961-01-27 DOA: 08/07/2016  PCP: [name], MD  Oncology: [name] [facility]) GI: He is seen by the [facility] GI group Surgery: [facility]  Patient coming from: Home  Chief Complaint: Progressive abdominal pain with nausea and vomiting  HPI: [name] is a 55 y.o. gentleman with a history of Stage IVB metastatic colon cancer S/P colon resection with LUQ ostomy, sarcoidosis, HTN, significant neuropathy, and chronic pain who presents to the ED for evaluation of progressive abdominal pain associated with nausea and vomiting of feculent appearing emesis.  He reports he has really had intermittent abdominal pain for months, but is has been progressively worse over the past several days.  He is now unable to tolerate PO (last meal was yesterday).  He has not had documented fever, but he has had chills and sweats.  Ostomy output is down.  He has not had hematemesis.  No chest pain or shortness of breath.  ED Course: Unfortunately, CT of the abdomen and pelvis is concerning for a small bowel obstruction, likely caused by metastatic cancer.  He may also have progression of disease with new liver lesions identified (full CT report below).  He has received a 1L NS bolus.  An NG tube is being placed in the ED.  He has required a total of 3mg  of IV dilaudid in the ED for pain control.  He is wearing his fentanyl patch (changed yesterday).  General Surgery consult placed from the ED.  Hospitalist asked to admit.  Review of Systems: As per HPI otherwise 10 point review of systems negative.    Past Medical History:  Diagnosis Date  . Abdominal pain    mid abdomen  . Adiposity 03/21/2015  . Arthritis   . Besnier-Boeck disease ([place]) 12/21/2012  . Colon cancer ([place])   . 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 Extreme obesity ([place]) 12/21/2012  . GERD (gastroesophageal reflux disease)   . History of kidney stones     . History of transfusion   . Hypertension    has been off BP med x 6 months  . Iron deficiency anemia due to chronic blood loss 05/08/2015  . LBP (low back pain) 05/28/2014  . Neuropathy ([place])   . PONV (postoperative nausea and vomiting)   . Sarcoid ([place]) 10/2012  . Skin cancer    "it flairs up with sarcoidosis"    Past Surgical History:  Procedure Laterality Date  . CARDIAC CATHETERIZATION    . FLEXIBLE SIGMOIDOSCOPY N/A 05/07/2015   Procedure: FLEXIBLE SIGMOIDOSCOPY;  Surgeon: Laurence Spates, MD;  Location: [facility];  Service: Endoscopy;  Laterality: N/A;  . LAPAROSCOPIC PARTIAL COLECTOMY N/A 05/07/2015   Procedure: LAPAROSCOPIC ASSITED PARTIAL COLECTOMY WITH COLOSTOMY, SMALL BOWEL RESECTION, EXCISION OF PERITONEAL NODULE;  Surgeon: [name], MD;  Location: [facility] ORS;  Service: General;  Laterality: N/A;  . PORTACATH PLACEMENT Right 05/27/2015   Procedure: INSERTION PORT-A-CATH;  Surgeon: [name], MD;  Location: [facility] ORS;  Service: General;  Laterality: Right;     reports that he is a non-smoker but has been exposed to tobacco smoke. He has never used smokeless tobacco. He reports that he does not drink alcohol or use drugs.  Allergies  Allergen Reactions  . Versed [Midazolam] Nausea And Vomiting  . Gabapentin Other (See Comments)    Swallowing problems  . Paba Derivatives Nausea And Vomiting    Pt states he is allergic to unknown anesthesia.  Pt has nausea and vomiting with anesthesia.     Family History  Problem Relation Age of Onset  . Arthritis Mother   . Heart disease Mother   . Stroke Father   . Prostate cancer Neg Hx   . Bladder Cancer Neg Hx      Prior to Admission medications   Medication Sig Start Date End Date Taking? Authorizing Provider  fentaNYL (DURAGESIC - DOSED MCG/HR) 25 MCG/HR patch Place 1 patch (25 mcg total) onto the skin every 3 (three) days. 07/26/16  Yes Cammie Sickle, MD  lisinopril (PRINIVIL,ZESTRIL) 10 MG tablet Take 10 mg  by mouth daily.   Yes Historical Provider, MD  oxyCODONE (OXY IR/ROXICODONE) 5 MG immediate release tablet Take 1 tablet (5 mg total) by mouth every 4 (four) hours as needed for severe pain. 08/02/16  Yes Cammie Sickle, MD  promethazine (PHENERGAN) 25 MG tablet Take 1 tablet (25 mg total) by mouth every 6 (six) hours as needed for nausea or vomiting. 06/24/16  Yes Cammie Sickle, MD  zolpidem (AMBIEN) 10 MG tablet Take 10-15 mg by mouth at bedtime as needed for sleep.   Yes Historical Provider, MD    Physical Exam: Vitals:   08/07/16 2130 08/07/16 2217 08/07/16 2230 08/07/16 2256  BP: 138/87 141/100 115/83 128/73  Pulse: 85 99 90 92  Resp:  18    Temp:    98.2 F (36.8 C)  TempSrc:    Oral  SpO2: 98% 95% 96% 93%  Weight:      Height:          Constitutional: ill appearing, active nausea with waves of abdominal pain Vitals:   08/07/16 2130 08/07/16 2217 08/07/16 2230 08/07/16 2256  BP: 138/87 141/100 115/83 128/73  Pulse: 85 99 90 92  Resp:  18    Temp:    98.2 F (36.8 C)  TempSrc:    Oral  SpO2: 98% 95% 96% 93%  Weight:      Height:       Eyes: PERRL, lids and conjunctivae normal ENMT: Mucous membranes are DRY. Posterior pharynx clear of any exudate or lesions. Normal dentition.  Neck: normal appearance, supple Respiratory: clear to auscultation bilaterally, no wheezing, no crackles. Normal respiratory effort. No accessory muscle use.  Cardiovascular: Normal rate, regular rhythm, no murmurs / rubs / gallops. No extremity edema. 2+ pedal pulses.  GI: abdomen is obese and distended.  No guarding.  No significant tenderness with palpation.  Bowel sounds are hypoactive.   Musculoskeletal:  No joint deformity in upper and lower extremities. Good ROM, no contractures. Normal muscle tone.  Skin: pale, cool, dry Neurologic: no focal deficits Psychiatric: Normal judgment and insight. Alert and oriented x 3. Normal mood.     Labs on Admission: I have personally  reviewed following labs and imaging studies  CBC:  Recent Labs Lab 08/02/16 1354 08/07/16 1800  WBC 7.8 14.2*  NEUTROABS 5.1 11.4*  HGB 12.5* 14.2  HCT 37.6* 42.1  MCV 87.7 87.7  PLT 234 [id number]   Basic Metabolic Panel:  Recent Labs Lab 08/02/16 1354 08/07/16 1800  NA 140 139  K 3.5 3.9  CL 107 106  CO2 27 23  GLUCOSE 114* 110*  BUN 14 12  CREATININE 1.21 1.01  CALCIUM 9.0 9.4   GFR: Estimated Creatinine Clearance: 110.9 mL/min (by C-G formula based on SCr of 1.01 mg/dL). Liver Function Tests:  Recent Labs Lab 08/02/16 1354 08/07/16 1800  AST 22 23  ALT  18 18  ALKPHOS 70 74  BILITOT 0.5 1.3*  PROT 7.2 8.0  ALBUMIN 3.9 4.2   Sepsis Labs:  Lactic acid level 2.12, then 1.3  Radiological Exams on Admission: Ct Abdomen Pelvis W Contrast  Result Date: 08/07/2016 CLINICAL DATA:  Abdominal pain, vomiting, colon cancer, colostomy in place EXAM: CT ABDOMEN AND PELVIS WITH CONTRAST TECHNIQUE: Multidetector CT imaging of the abdomen and pelvis was performed using the standard protocol following bolus administration of intravenous contrast. CONTRAST:  132mL ISOVUE-300 IOPAMIDOL (ISOVUE-300) INJECTION 61% COMPARISON:  03/22/2016 FINDINGS: Lower chest: Lung bases shows no acute findings. Small hiatal hernia. Hepatobiliary: There is subtle low-density lesion in right hepatic lobe medially just posterior to IVC measures 1.1 cm. Metastatic disease cannot be excluded. Question second hepatic lesion in right hepatic lobe measures 7 mm. Further correlation with MRI is recommended to exclude metastatic disease. Pancreas: Mild atrophic pancreas without focal abnormality. Spleen: Enhanced spleen is unremarkable. Adrenals/Urinary Tract: No adrenal gland mass. Kidneys are symmetrical in size. Bilateral nonobstructive nephrolithiasis. No hydronephrosis. There is nonobstructive calcified calculus in proximal right ureter measures 4.5 mm. No calcified calculi are noted within urinary bladder.  Stomach/Bowel: There is no gastric outlet obstruction. Again noted postsurgical changes with anastomosis in small bowel in right upper abdomen. There is dilatation of proximal small bowel and mid small bowel with some fluid and air-fluid levels. A colostomy is noted in left abdomen. Axial image fifty-eight there is transition point in caliber of small bowel in left abdomen just inferior to level of colostomy. Non distended small bowel loops are noted in left lower quadrant. Findings are consistent with complete small bowel obstruction. Findings may be due to adhesion or neoplastic involvement as there is a soft tissue density adjacent to small bowel loops left anterior fascia axial image 60. Metastatic disease cannot be excluded. Again noted a right paramedian ventral hernia containing omental fat without evidence of acute complication. There is a midline ventral hernia containing some omental fat without evidence of acute complication. Please note there is irregular soft tissue density within hernia measures 2.8 cm axial image 41. Metastatic seeding cannot be excluded. A second nodule within hernia axial image 49 measures 8 mm metastatic disease cannot be excluded. The colon entering the colostomy is totally decompressed. There is some residual stool within cecum. The transverse colon is decompressed. Vascular/Lymphatic:  No aortic aneurysm. Reproductive: Prostate gland and seminal vesicles are unremarkable. Other: No ascites or free abdominal air. Musculoskeletal: No destructive bony lesions are noted. Sagittal images of the spine shows mild degenerative changes thoracolumbar spine. IMPRESSION: 1. Findings consistent with small bowel obstruction with transition point in caliber of small bowel. There is total decompression of distal small bowel loops. Findings may be due to adhesion or neoplastic involvement as there is a soft tissue density in axial image 60 left anterior mesentery/fascia measures about 6.5 cm. 2.  There is a ventral hernia in right mid abdomen. There is soft tissue density within hernia measures 2.8 cm. Metastatic seeding cannot be excluded. Clinical correlation is necessary. 3. A colostomy is noted in left abdomen. There is decompression of the distal colon entering the colostomy. 4. No evidence of ascites or free abdominal air. 5. Question subtle low-density lesions within liver. Further correlation with MRI is recommended to exclude metastatic disease. 6. Small hiatal hernia. These results were called by telephone at the time of interpretation on 08/07/2016 at 8:30 pm to Dr. Montine Circle , who verbally acknowledged these results. Electronically Signed   By:  Lahoma Crocker M.D.   On: 08/07/2016 20:31   Dg Abd Portable 1v-small Bowel Protocol-position Verification  Result Date: 08/07/2016 CLINICAL DATA:  Status post enteric tube placement. EXAM: PORTABLE ABDOMEN - 1 VIEW COMPARISON:  CT abdomen 08/07/2016. FINDINGS: Enteric tube tip projects over the left upper quadrant. Prominent loops of small bowel demonstrated throughout the abdomen. Contrast material within the right renal collecting system. IMPRESSION: Enteric tube tip projects over the left upper quadrant, likely within the stomach. Electronically Signed   By: Lovey Newcomer M.D.   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  Agree with NG tube to LIS since he is having significant nausea and vomiting. --IV fluids --Analgesics and antiemetics as needed --General Surgery  following but will take a conservative approach.  Goals of care need to be discussed with the family and the primary oncology team, particularly given the new findings on CT.  Anticipate he is approaching palliative measures.  Leukocytosis, likely reactive --Monitor --Would get blood cultures for fever greater than 100.4   DVT prophylaxis: SCDs Code Status: DNR/DNI Family Communication: Wife at bedside in the ED at time of admission. Disposition Plan: To be determined. Consults called: General Surgery Dr. Johney Maine Admission status: Inpatient med surg.  I expect this patient will need inpatient services for at least two midnights.  At risk for bowel perforation, fulminant sepsis, and death without inpatient management for his bowel obstruction.   TIME SPENT: 60 minutes   Eber Jones MD [facility] Pager [phone]  If 7PM-7AM, please contact night-coverage [email or web address] Password [email or web address]  08/07/2016, 11:01 PM

## 2016-08-08 ENCOUNTER — Inpatient Hospital Stay (HOSPITAL_COMMUNITY): Payer: BLUE CROSS/BLUE SHIELD

## 2016-08-08 ENCOUNTER — Encounter (HOSPITAL_COMMUNITY): Payer: Self-pay | Admitting: *Deleted

## 2016-08-08 LAB — BASIC METABOLIC PANEL
Anion gap: 8 (ref 5–15)
BUN: 14 mg/dL (ref 6–20)
CO2: 25 mmol/L (ref 22–32)
CREATININE: 0.94 mg/dL (ref 0.61–1.24)
Calcium: 8.9 mg/dL (ref 8.9–10.3)
Chloride: 111 mmol/L (ref 101–111)
Glucose, Bld: 113 mg/dL — ABNORMAL HIGH (ref 65–99)
Potassium: 4 mmol/L (ref 3.5–5.1)
SODIUM: 144 mmol/L (ref 135–145)

## 2016-08-08 LAB — CBC
HCT: 40.6 % (ref 39.0–52.0)
Hemoglobin: 13.2 g/dL (ref 13.0–17.0)
MCH: 29.1 pg (ref 26.0–34.0)
MCHC: 32.5 g/dL (ref 30.0–36.0)
MCV: 89.4 fL (ref 78.0–100.0)
PLATELETS: 255 10*3/uL (ref 150–400)
RBC: 4.54 MIL/uL (ref 4.22–5.81)
RDW: 14 % (ref 11.5–15.5)
WBC: 11.4 10*3/uL — ABNORMAL HIGH (ref 4.0–10.5)

## 2016-08-08 MED ORDER — DIPHENHYDRAMINE HCL 50 MG/ML IJ SOLN
25.0000 mg | Freq: Every evening | INTRAMUSCULAR | Status: DC | PRN
  Administered 2016-08-08 – 2016-08-17 (×2): 25 mg via INTRAVENOUS
  Filled 2016-08-08 (×2): qty 1

## 2016-08-08 MED ORDER — METHOCARBAMOL 1000 MG/10ML IJ SOLN
1000.0000 mg | Freq: Four times a day (QID) | INTRAVENOUS | Status: DC | PRN
  Administered 2016-08-10 – 2016-08-13 (×2): 1000 mg via INTRAVENOUS
  Filled 2016-08-08 (×5): qty 10

## 2016-08-08 MED ORDER — METOCLOPRAMIDE HCL 5 MG/ML IJ SOLN: 5.0000 mg | Freq: Four times a day (QID) | INTRAMUSCULAR | Status: DC | PRN

## 2016-08-08 MED ORDER — DIPHENHYDRAMINE HCL 50 MG/ML IJ SOLN: 12.5000 mg | Freq: Four times a day (QID) | INTRAMUSCULAR | Status: DC | PRN

## 2016-08-08 MED ORDER — METOPROLOL TARTRATE 5 MG/5ML IV SOLN
5.0000 mg | Freq: Four times a day (QID) | INTRAVENOUS | Status: DC | PRN
  Filled 2016-08-08: qty 5

## 2016-08-08 MED ORDER — LORAZEPAM 2 MG/ML IJ SOLN
0.5000 mg | Freq: Three times a day (TID) | INTRAMUSCULAR | Status: DC | PRN
  Administered 2016-08-09: 1 mg via INTRAVENOUS
  Administered 2016-08-10 (×2): 0.5 mg via INTRAVENOUS
  Administered 2016-08-10 – 2016-08-18 (×8): 1 mg via INTRAVENOUS
  Filled 2016-08-08 (×11): qty 1

## 2016-08-08 MED ORDER — LACTATED RINGERS IV BOLUS (SEPSIS)
1000.0000 mL | Freq: Once | INTRAVENOUS | Status: AC
  Administered 2016-08-08: 1000 mL via INTRAVENOUS

## 2016-08-08 NOTE — Progress Notes (Signed)
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  Probably high-grade due to focal recurrent intraperitoneal metastases/carcinomatosis.  Plan:  IV fluid rehydration.  Nasogastric tube decompression.  Was not functioning properly.  I troubleshooted & got it to work.  500 mL of feculent fluid immediately returned.  Some relief  Small bowel protocol.  Eight hour delay film pending.  Input from medicine and especially medical oncology about nonoperative options and goals of care given the fact that he is likely has recurrence of his metastatic colon cancer.  If he does not open up and does not improve by x-rays, most likely would require surgery to resolve this problem.  Possible resection of intraperitoneal metastases and bowel resection.  Surgical risks increased in this patient with metastatic colon cancer.  They would like to try to avoid surgery and see if this will open up nonoperatively.  He is not in shock nor perforated, so we will give it another day or two to see if he will open up.  See what other options are available.  Goals of care.  -IV medication for insomnia. -Nausea control -VTE prophylaxis- SCDs, etc -colostomy care -mobilize as tolerated to help recovery  Adin Hector, M.D., F.A.C.S. Gastrointestinal and Minimally Invasive Surgery [facility] [address]  [place], [place] [address] [phone] Main / Paging   08/08/2016  CARE TEAM:  PCP: [name], MD  Outpatient Care Team: Patient Care Team: [name], MD as PCP - General (Family Medicine) [name], MD as Consulting Physician (General Surgery) Cammie Sickle, MD as Consulting Physician (Oncology) Molli Barrows, MD as Consulting Physician (Pain Medicine) Festus Aloe, MD as Consulting Physician (Urology) Lucilla Lame, MD as Consulting Physician (Gastroenterology) Laurence Spates, MD as Consulting Physician (Gastroenterology)  Inpatient Treatment Team: Treatment Team: Attending Provider: Donne Hazel, MD; Consulting Physician: Nolon Nations, MD; Registered Nurse: Candie Chroman, RN; Rounding Team: Redmond Baseman, MD; Registered Nurse: Griffin Basil, RN  Subjective:  Could not sleep. Still with pain and bloating. Nauseated but not vomiting. Wife at bedside.    Objective:  Vital signs:  Vitals:   08/07/16 2217 08/07/16 2230 08/07/16 2256 08/08/16 0532  BP: 141/100 115/83 128/73 117/70  Pulse: 99 90 92 83  Resp: 18     Temp:   98.2 F (36.8 C) 98.2 F (36.8 C)  TempSrc:   Oral Oral  SpO2: 95% 96% 93% 92%  Weight:      Height:        Last BM Date: 08/08/16  Intake/Output   Yesterday:  11/25 0701 - 11/26 0700 In: 1800 [I.V.:1800] Out: 150 [Emesis/NG output:150] This shift:  Total I/O In: 0  Out: 300 [Urine:300]  Bowel function:  Flatus: No  BM:  No  Drain: Nasogastric tube with thick dark brown feculent-like material returning in canister   Physical Exam:  General: Pt  awake/alert/oriented x4 in mild acute distress Eyes: PERRL, normal EOM.  Sclera clear.  No icterus Neuro: CN II-XII intact w/o focal sensory/motor deficits. Lymph: No head/neck/groin lymphadenopathy Psych:  No delerium/psychosis/paranoia HENT: Normocephalic, Mucus membranes moist.  No thrush Neck: Supple, No tracheal deviation Chest: No chest wall  pain w good excursion CV:  Pulses intact.  Regular rhythm MS: Normal AROM mjr joints.  No obvious deformity Abdomen: Somewhat firm.  Moderately distended.  Tenderness at Reducible hernias and left lower quadrant.  No guarding.  No rebound..  No evidence of peritonitis.  No incarcerated hernias. Ext:  SCDs BLE.  No mjr edema.  No cyanosis Skin: No petechiae / purpura  Results:   Labs: Results for orders placed or performed during the hospital encounter of 08/07/16 (from the past 48 hour(s))  CBC with Differential/Platelet     Status: Abnormal   Collection Time: 08/07/16  6:00 PM  Result Value Ref Range   WBC 14.2 (H) 4.0 - 10.5 K/uL   RBC 4.80 4.22 - 5.81 MIL/uL   Hemoglobin 14.2 13.0 - 17.0 g/dL   HCT 42.1 39.0 - 52.0 %   MCV 87.7 78.0 - 100.0 fL   MCH 29.6 26.0 - 34.0 pg   MCHC 33.7 30.0 - 36.0 g/dL   RDW 13.6 11.5 - 15.5 %   Platelets 290 150 - 400 K/uL   Neutrophils Relative % 80 %   Neutro Abs 11.4 (H) 1.7 - 7.7 K/uL   Lymphocytes Relative 10 %   Lymphs Abs 1.5 0.7 - 4.0 K/uL   Monocytes Relative 10 %   Monocytes Absolute 1.4 (H) 0.1 - 1.0 K/uL   Eosinophils Relative 0 %   Eosinophils Absolute 0.0 0.0 - 0.7 K/uL   Basophils Relative 0 %   Basophils Absolute 0.0 0.0 - 0.1 K/uL  Comprehensive metabolic panel     Status: Abnormal   Collection Time: 08/07/16  6:00 PM  Result Value Ref Range   Sodium 139 135 - 145 mmol/L   Potassium 3.9 3.5 - 5.1 mmol/L   Chloride 106 101 - 111 mmol/L   CO2 23 22 - 32 mmol/L   Glucose, Bld 110 (H) 65 - 99 mg/dL   BUN 12 6 - 20 mg/dL   Creatinine, Ser 1.01 0.61 - 1.24 mg/dL   Calcium 9.4 8.9 - 10.3 mg/dL   Total Protein 8.0 6.5 - 8.1 g/dL   Albumin 4.2 3.5 - 5.0 g/dL   AST 23 15 - 41 U/L   ALT 18 17 - 63 U/L   Alkaline Phosphatase 74 38 - 126 U/L   Total Bilirubin 1.3 (H) 0.3 - 1.2 mg/dL   GFR calc non Af Amer >60 >60 mL/min   GFR calc Af Amer >60 >60 mL/min    Comment: (NOTE) The eGFR has been calculated using the CKD EPI  equation. This calculation has not been validated in all clinical situations. eGFR's persistently <60 mL/min signify possible Chronic Kidney Disease.    Anion gap 10 5 - 15  I-Stat CG4 Lactic Acid, ED     Status: Abnormal   Collection Time: 08/07/16  6:09 PM  Result Value Ref Range   Lactic Acid, Venous 2.12 (HH) 0.5 - 1.9 mmol/L   Comment NOTIFIED PHYSICIAN   I-Stat CG4 Lactic Acid, ED     Status: None   Collection Time: 08/07/16  8:30 PM  Result Value Ref Range   Lactic Acid, Venous 1.30 0.5 - 1.9 mmol/L  Glucose, capillary  Status: Abnormal   Collection Time: 08/07/16 11:03 PM  Result Value Ref Range   Glucose-Capillary 118 (H) 65 - 99 mg/dL  CBC     Status: Abnormal   Collection Time: 08/08/16  4:29 AM  Result Value Ref Range   WBC 11.4 (H) 4.0 - 10.5 K/uL   RBC 4.54 4.22 - 5.81 MIL/uL   Hemoglobin 13.2 13.0 - 17.0 g/dL   HCT 40.6 39.0 - 52.0 %   MCV 89.4 78.0 - 100.0 fL   MCH 29.1 26.0 - 34.0 pg   MCHC 32.5 30.0 - 36.0 g/dL   RDW 14.0 11.5 - 15.5 %   Platelets 255 150 - 400 K/uL  Basic metabolic panel     Status: Abnormal   Collection Time: 08/08/16  4:29 AM  Result Value Ref Range   Sodium 144 135 - 145 mmol/L   Potassium 4.0 3.5 - 5.1 mmol/L   Chloride 111 101 - 111 mmol/L   CO2 25 22 - 32 mmol/L   Glucose, Bld 113 (H) 65 - 99 mg/dL   BUN 14 6 - 20 mg/dL   Creatinine, Ser 0.94 0.61 - 1.24 mg/dL   Calcium 8.9 8.9 - 10.3 mg/dL   GFR calc non Af Amer >60 >60 mL/min   GFR calc Af Amer >60 >60 mL/min    Comment: (NOTE) The eGFR has been calculated using the CKD EPI equation. This calculation has not been validated in all clinical situations. eGFR's persistently <60 mL/min signify possible Chronic Kidney Disease.    Anion gap 8 5 - 15    Imaging / Studies: Ct Abdomen Pelvis W Contrast  Result Date: 08/07/2016 CLINICAL DATA:  Abdominal pain, vomiting, colon cancer, colostomy in place EXAM: CT ABDOMEN AND PELVIS WITH CONTRAST TECHNIQUE: Multidetector CT  imaging of the abdomen and pelvis was performed using the standard protocol following bolus administration of intravenous contrast. CONTRAST:  160m ISOVUE-300 IOPAMIDOL (ISOVUE-300) INJECTION 61% COMPARISON:  03/22/2016 FINDINGS: Lower chest: Lung bases shows no acute findings. Small hiatal hernia. Hepatobiliary: There is subtle low-density lesion in right hepatic lobe medially just posterior to IVC measures 1.1 cm. Metastatic disease cannot be excluded. Question second hepatic lesion in right hepatic lobe measures 7 mm. Further correlation with MRI is recommended to exclude metastatic disease. Pancreas: Mild atrophic pancreas without focal abnormality. Spleen: Enhanced spleen is unremarkable. Adrenals/Urinary Tract: No adrenal gland mass. Kidneys are symmetrical in size. Bilateral nonobstructive nephrolithiasis. No hydronephrosis. There is nonobstructive calcified calculus in proximal right ureter measures 4.5 mm. No calcified calculi are noted within urinary bladder. Stomach/Bowel: There is no gastric outlet obstruction. Again noted postsurgical changes with anastomosis in small bowel in right upper abdomen. There is dilatation of proximal small bowel and mid small bowel with some fluid and air-fluid levels. A colostomy is noted in left abdomen. Axial image fifty-eight there is transition point in caliber of small bowel in left abdomen just inferior to level of colostomy. Non distended small bowel loops are noted in left lower quadrant. Findings are consistent with complete small bowel obstruction. Findings may be due to adhesion or neoplastic involvement as there is a soft tissue density adjacent to small bowel loops left anterior fascia axial image 60. Metastatic disease cannot be excluded. Again noted a right paramedian ventral hernia containing omental fat without evidence of acute complication. There is a midline ventral hernia containing some omental fat without evidence of acute complication. Please note  there is irregular soft tissue density within hernia measures 2.8 cm axial  image 41. Metastatic seeding cannot be excluded. A second nodule within hernia axial image 49 measures 8 mm metastatic disease cannot be excluded. The colon entering the colostomy is totally decompressed. There is some residual stool within cecum. The transverse colon is decompressed. Vascular/Lymphatic:  No aortic aneurysm. Reproductive: Prostate gland and seminal vesicles are unremarkable. Other: No ascites or free abdominal air. Musculoskeletal: No destructive bony lesions are noted. Sagittal images of the spine shows mild degenerative changes thoracolumbar spine. IMPRESSION: 1. Findings consistent with small bowel obstruction with transition point in caliber of small bowel. There is total decompression of distal small bowel loops. Findings may be due to adhesion or neoplastic involvement as there is a soft tissue density in axial image 60 left anterior mesentery/fascia measures about 6.5 cm. 2. There is a ventral hernia in right mid abdomen. There is soft tissue density within hernia measures 2.8 cm. Metastatic seeding cannot be excluded. Clinical correlation is necessary. 3. A colostomy is noted in left abdomen. There is decompression of the distal colon entering the colostomy. 4. No evidence of ascites or free abdominal air. 5. Question subtle low-density lesions within liver. Further correlation with MRI is recommended to exclude metastatic disease. 6. Small hiatal hernia. These results were called by telephone at the time of interpretation on 08/07/2016 at 8:30 pm to Dr. Montine Circle , who verbally acknowledged these results. Electronically Signed   By: Lahoma Crocker M.D.   On: 08/07/2016 20:31   Dg Abd Portable 1v-small Bowel Protocol-position Verification  Result Date: 08/07/2016 CLINICAL DATA:  Status post enteric tube placement. EXAM: PORTABLE ABDOMEN - 1 VIEW COMPARISON:  CT abdomen 08/07/2016. FINDINGS: Enteric tube tip  projects over the left upper quadrant. Prominent loops of small bowel demonstrated throughout the abdomen. Contrast material within the right renal collecting system. IMPRESSION: Enteric tube tip projects over the left upper quadrant, likely within the stomach. Electronically Signed   By: Lovey Newcomer M.D.   On: 08/07/2016 22:50    Medications / Allergies: per chart  Antibiotics: Anti-infectives    None        Note: Portions of this report may have been transcribed using voice recognition software. Every effort was made to ensure accuracy; however, inadvertent computerized transcription errors may be present.   Any transcriptional errors that result from this process are unintentional.     Adin Hector, M.D., F.A.C.S. Gastrointestinal and Minimally Invasive Surgery [facility] [address] [place], [place] [address] [phone] Main / Paging   08/08/2016

## 2016-08-08 NOTE — Progress Notes (Signed)
PROGRESS NOTE    [name]  [id number] DOB: 1961/03/10 DOA: 08/07/2016 PCP: [name], MD    Brief Narrative:  55 y.o. gentleman with a history of Stage IVB metastatic colon cancer S/P colon resection with LUQ ostomy, sarcoidosis, HTN, significant neuropathy, and chronic pain who presents to the ED for evaluation of progressive abdominal pain associated with nausea and vomiting of feculent appearing emesis.  He reports he has really had intermittent abdominal pain for months, but is has been progressively worse over the past several days.  He is now unable to tolerate PO (last meal was yesterday).  He has not had documented fever, but he has had chills and sweats.  Ostomy output is down.  He has not had hematemesis.  No chest pain or shortness of breath.  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 surgery consulted. -Patient is continued with nothing by mouth status, NG tube to low wall suction -Discussed case with surgery. General surgery has requested a formal oncology consultation for input regarding nonoperative options and goals of care -Continue basal IV fluids -Continue analgesics as tolerated --IV fluids  Colon cancer -Patient followed by [name] at [facility] reviewed. Patient has failed multiple chemotherapy regimens in the past. -Patient is status post colostomy -Have discussed case with our on-call oncologist, [name] -[name] to discuss with patient's primary oncologist for commenting on nonoperative treatment -Will likely benefit from palliative care  consultation pending formal input by oncology  Hypertension -Vital signs reviewed -Blood pressure currently stable and controlled -We'll continue current regimen  Pulmonary sarcoidosis -On minimal to support currently -No wheezing on auscultation, lungs sound clear  Insomnia -We'll prescribe Benadryl daily at bedtime when necessary sleep  Leukocytosis, likely reactive --Labs reviewed. -Leukocytosis improved overnight -Suspect reactive leukocytosis secondary to presenting small bowel obstruction -Continue to monitor for fevers  DVT prophylaxis: SCD's Code Status: DO NOT RESUSCITATE Family Communication: Family at bedside Disposition Plan: Uncertain at this time  Consultants:   Oncology  General surgery  Procedures:     Antimicrobials: Anti-infectives    None       Subjective: Complains of difficulty sleeping at night, requesting sleep aid  Objective: Vitals:   08/07/16 2230 08/07/16 2256 08/08/16 0532 08/08/16 1412  BP: 115/83 128/73 117/70 122/75  Pulse: 90 92 83 70  Resp:    16  Temp:  98.2 F (36.8 C) 98.2 F (36.8 C) 97.7 F (36.5 C)  TempSrc:  Oral Oral Oral  SpO2: 96% 93% 92% 94%  Weight:      Height:        Intake/Output Summary (Last 24 hours) at 08/08/16 1653 Last data filed at 08/08/16 1600  Gross per 24 hour  Intake             3700 ml  Output             1650 ml  Net             2050 ml   Filed Weights   08/07/16 1634  Weight: 127 kg (280 lb)    Examination:  General  exam: Appears calm and comfortable  Respiratory system: Clear to auscultation. Respiratory effort normal. Cardiovascular system: S1 & S2 heard, RRR Gastrointestinal system: Decreased bowel sounds, distended, NG tube in place Central nervous system: Alert and oriented. No focal neurological deficits. Extremities: Symmetric 5 x 5 power. Skin: No rashes, lesions Psychiatry: Judgement and insight appear normal. Mood & affect appropriate.   Data Reviewed: I have  personally reviewed following labs and imaging studies  CBC:  Recent Labs Lab 08/02/16 1354 08/07/16 1800 08/08/16 0429  WBC 7.8 14.2* 11.4*  NEUTROABS 5.1 11.4*  --   HGB 12.5* 14.2 13.2  HCT 37.6* 42.1 40.6  MCV 87.7 87.7 89.4  PLT 234 290 [id number]   Basic Metabolic Panel:  Recent Labs Lab 08/02/16 1354 08/07/16 1800 08/08/16 0429  NA 140 139 144  K 3.5 3.9 4.0  CL 107 106 111  CO2 27 23 25   GLUCOSE 114* 110* 113*  BUN 14 12 14   CREATININE 1.21 1.01 0.94  CALCIUM 9.0 9.4 8.9   GFR: Estimated Creatinine Clearance: 119.1 mL/min (by C-G formula based on SCr of 0.94 mg/dL). Liver Function Tests:  Recent Labs Lab 08/02/16 1354 08/07/16 1800  AST 22 23  ALT 18 18  ALKPHOS 70 74  BILITOT 0.5 1.3*  PROT 7.2 8.0  ALBUMIN 3.9 4.2   No results for input(s): LIPASE, AMYLASE in the last 168 hours. No results for input(s): AMMONIA in the last 168 hours. Coagulation Profile: No results for input(s): INR, PROTIME in the last 168 hours. Cardiac Enzymes: No results for input(s): CKTOTAL, CKMB, CKMBINDEX, TROPONINI in the last 168 hours. BNP (last 3 results) No results for input(s): PROBNP in the last 8760 hours. HbA1C: No results for input(s): HGBA1C in the last 72 hours. CBG:  Recent Labs Lab 08/07/16 2303  GLUCAP 118*   Lipid Profile: No results for input(s): CHOL, HDL, LDLCALC, TRIG, CHOLHDL, LDLDIRECT in the last 72 hours. Thyroid Function Tests: No results for input(s): TSH, T4TOTAL, FREET4, T3FREE, THYROIDAB in the last 72 hours. Anemia Panel: No results for input(s): VITAMINB12, FOLATE, FERRITIN, TIBC, IRON, RETICCTPCT in the last 72 hours. Sepsis Labs:  Recent Labs Lab 08/07/16 1809 08/07/16 2030  LATICACIDVEN 2.12* 1.30    No results found for this or any previous visit (from the past 240 hour(s)).   Radiology Studies: Ct Abdomen Pelvis W Contrast  Result Date: 08/07/2016 CLINICAL DATA:  Abdominal pain, vomiting, colon cancer, colostomy in  place EXAM: CT ABDOMEN AND PELVIS WITH CONTRAST TECHNIQUE: Multidetector CT imaging of the abdomen and pelvis was performed using the standard protocol following bolus administration of intravenous contrast. CONTRAST:  117mL ISOVUE-300 IOPAMIDOL (ISOVUE-300) INJECTION 61% COMPARISON:  03/22/2016 FINDINGS: Lower chest: Lung bases shows no acute findings. Small hiatal hernia. Hepatobiliary: There is subtle low-density lesion in right hepatic lobe medially just posterior to IVC measures 1.1 cm. Metastatic disease cannot be excluded. Question second hepatic lesion in right hepatic lobe measures 7 mm. Further correlation with MRI is recommended to exclude metastatic disease. Pancreas: Mild atrophic pancreas without focal abnormality. Spleen: Enhanced spleen is unremarkable. Adrenals/Urinary Tract: No adrenal gland mass. Kidneys are symmetrical in size. Bilateral nonobstructive nephrolithiasis. No hydronephrosis. There is nonobstructive calcified calculus in proximal right ureter measures 4.5 mm. No calcified calculi are noted within urinary bladder. Stomach/Bowel: There is no gastric outlet obstruction. Again noted postsurgical changes with anastomosis in small bowel in right upper abdomen. There is dilatation of proximal small bowel and mid small bowel with some fluid and air-fluid  levels. A colostomy is noted in left abdomen. Axial image fifty-eight there is transition point in caliber of small bowel in left abdomen just inferior to level of colostomy. Non distended small bowel loops are noted in left lower quadrant. Findings are consistent with complete small bowel obstruction. Findings may be due to adhesion or neoplastic involvement as there is a soft tissue density adjacent to small bowel loops left anterior fascia axial image 60. Metastatic disease cannot be excluded. Again noted a right paramedian ventral hernia containing omental fat without evidence of acute complication. There is a midline ventral hernia  containing some omental fat without evidence of acute complication. Please note there is irregular soft tissue density within hernia measures 2.8 cm axial image 41. Metastatic seeding cannot be excluded. A second nodule within hernia axial image 49 measures 8 mm metastatic disease cannot be excluded. The colon entering the colostomy is totally decompressed. There is some residual stool within cecum. The transverse colon is decompressed. Vascular/Lymphatic:  No aortic aneurysm. Reproductive: Prostate gland and seminal vesicles are unremarkable. Other: No ascites or free abdominal air. Musculoskeletal: No destructive bony lesions are noted. Sagittal images of the spine shows mild degenerative changes thoracolumbar spine. IMPRESSION: 1. Findings consistent with small bowel obstruction with transition point in caliber of small bowel. There is total decompression of distal small bowel loops. Findings may be due to adhesion or neoplastic involvement as there is a soft tissue density in axial image 60 left anterior mesentery/fascia measures about 6.5 cm. 2. There is a ventral hernia in right mid abdomen. There is soft tissue density within hernia measures 2.8 cm. Metastatic seeding cannot be excluded. Clinical correlation is necessary. 3. A colostomy is noted in left abdomen. There is decompression of the distal colon entering the colostomy. 4. No evidence of ascites or free abdominal air. 5. Question subtle low-density lesions within liver. Further correlation with MRI is recommended to exclude metastatic disease. 6. Small hiatal hernia. These results were called by telephone at the time of interpretation on 08/07/2016 at 8:30 pm to Dr. Montine Circle , who verbally acknowledged these results. Electronically Signed   By: Lahoma Crocker M.D.   On: 08/07/2016 20:31   Dg Abd Portable 1v-small Bowel Obstruction Protocol-initial, 8 Hr Delay  Result Date: 08/08/2016 CLINICAL DATA:  Small bowel obstruction EXAM: PORTABLE  ABDOMEN - 1 VIEW COMPARISON:  CT scan 08/07/2016 FINDINGS: Contrast material noted within distended small bowel loops consistent with small bowel obstruction. Paucity of bowel gas within colon. Contrast material from recent CT scan noted within urinary bladder. IMPRESSION: Contrast material noted within distended small bowel loops consistent with small bowel obstruction. Electronically Signed   By: Lahoma Crocker M.D.   On: 08/08/2016 10:04   Dg Abd Portable 1v-small Bowel Protocol-position Verification  Result Date: 08/07/2016 CLINICAL DATA:  Status post enteric tube placement. EXAM: PORTABLE ABDOMEN - 1 VIEW COMPARISON:  CT abdomen 08/07/2016. FINDINGS: Enteric tube tip projects over the left upper quadrant. Prominent loops of small bowel demonstrated throughout the abdomen. Contrast material within the right renal collecting system. IMPRESSION: Enteric tube tip projects over the left upper quadrant, likely within the stomach. Electronically Signed   By: Lovey Newcomer M.D.   On: 08/07/2016 22:50    Scheduled Meds: . [START ON 08/09/2016] [id number]  25 mcg Transdermal Q72H  . lip balm  1 application Topical BID   Continuous Infusions: . sodium chloride 100 mL/hr at 08/08/16 0920     LOS: 1 day   CHIU,  Orpah Melter, MD [facility] Pager [phone]  If 7PM-7AM, please contact night-coverage [email or web address] Password [email or web address] 08/08/2016, 4:53 PM

## 2016-08-09 ENCOUNTER — Inpatient Hospital Stay (HOSPITAL_COMMUNITY): Payer: BLUE CROSS/BLUE SHIELD

## 2016-08-09 DIAGNOSIS — C186 Malignant neoplasm of descending colon: Secondary | ICD-10-CM

## 2016-08-09 DIAGNOSIS — K56609 Unspecified intestinal obstruction, unspecified as to partial versus complete obstruction: Secondary | ICD-10-CM

## 2016-08-09 LAB — CBC
HEMATOCRIT: 36.3 % — AB (ref 39.0–52.0)
HEMOGLOBIN: 11.8 g/dL — AB (ref 13.0–17.0)
MCH: 28.9 pg (ref 26.0–34.0)
MCHC: 32.5 g/dL (ref 30.0–36.0)
MCV: 88.8 fL (ref 78.0–100.0)
Platelets: 217 10*3/uL (ref 150–400)
RBC: 4.09 MIL/uL — ABNORMAL LOW (ref 4.22–5.81)
RDW: 13.8 % (ref 11.5–15.5)
WBC: 6.6 10*3/uL (ref 4.0–10.5)

## 2016-08-09 LAB — URINALYSIS, ROUTINE W REFLEX MICROSCOPIC
GLUCOSE, UA: NEGATIVE mg/dL
HGB URINE DIPSTICK: NEGATIVE
Ketones, ur: 40 mg/dL — AB
Leukocytes, UA: NEGATIVE
Nitrite: NEGATIVE
PH: 6 (ref 5.0–8.0)
Protein, ur: NEGATIVE mg/dL
SPECIFIC GRAVITY, URINE: 1.027 (ref 1.005–1.030)

## 2016-08-09 LAB — COMPREHENSIVE METABOLIC PANEL
ALK PHOS: 58 U/L (ref 38–126)
ALT: 18 U/L (ref 17–63)
ANION GAP: 5 (ref 5–15)
AST: 23 U/L (ref 15–41)
Albumin: 3.6 g/dL (ref 3.5–5.0)
BILIRUBIN TOTAL: 0.9 mg/dL (ref 0.3–1.2)
BUN: 11 mg/dL (ref 6–20)
CALCIUM: 8.4 mg/dL — AB (ref 8.9–10.3)
CO2: 30 mmol/L (ref 22–32)
CREATININE: 0.93 mg/dL (ref 0.61–1.24)
Chloride: 112 mmol/L — ABNORMAL HIGH (ref 101–111)
GFR calc non Af Amer: >60 mL/min (ref >60)
Glucose, Bld: 92 mg/dL (ref 65–99)
Potassium: 3.6 mmol/L (ref 3.5–5.1)
Sodium: 147 mmol/L — ABNORMAL HIGH (ref 135–145)
TOTAL PROTEIN: 6.8 g/dL (ref 6.5–8.1)

## 2016-08-09 LAB — BASIC METABOLIC PANEL
Anion gap: 8 (ref 5–15)
BUN: 13 mg/dL (ref 6–20)
CHLORIDE: 110 mmol/L (ref 101–111)
CO2: 27 mmol/L (ref 22–32)
CREATININE: 1.01 mg/dL (ref 0.61–1.24)
Calcium: 8.4 mg/dL — ABNORMAL LOW (ref 8.9–10.3)
GFR calc non Af Amer: >60 mL/min (ref >60)
Glucose, Bld: 96 mg/dL (ref 65–99)
Potassium: 3.6 mmol/L (ref 3.5–5.1)
SODIUM: 145 mmol/L (ref 135–145)

## 2016-08-09 MED ORDER — SODIUM CHLORIDE 0.9% FLUSH
10.0000 mL | INTRAVENOUS | Status: DC | PRN
  Administered 2016-08-09: 20 mL
  Administered 2016-08-10: 10 mL
  Filled 2016-08-09: qty 40

## 2016-08-09 MED ORDER — KETOROLAC TROMETHAMINE 15 MG/ML IJ SOLN
15.0000 mg | Freq: Four times a day (QID) | INTRAMUSCULAR | Status: DC | PRN
  Administered 2016-08-09 – 2016-08-11 (×4): 15 mg via INTRAVENOUS
  Filled 2016-08-09 (×4): qty 1

## 2016-08-09 MED ORDER — FENTANYL 50 MCG/HR TD PT72
50.0000 ug | MEDICATED_PATCH | TRANSDERMAL | Status: DC
  Administered 2016-08-09: 50 ug via TRANSDERMAL
  Filled 2016-08-09: qty 1

## 2016-08-09 NOTE — Progress Notes (Signed)
Subjective: No better, also noting pain left side and feels like he has kidney stones again.  He says he get those with IV fluids.  Some blood in urine.  Pain is chronic and some is acute.  Fentanyl patch, and IV bolus q2h not holding him.  Objective: Vital signs in last 24 hours: Temp:  [97.6 F (36.4 C)-98.2 F (36.8 C)] 98.2 F (36.8 C) (11/27 0618) Pulse Rate:  [58-81] 81 (11/27 0618) Resp:  [16-18] 18 (11/27 0618) BP: (122-147)/(68-75) 145/68 (11/27 0618) SpO2:  [94 %-95 %] 94 % (11/27 0618) Last BM Date: 08/08/16 NPO Urine 1675 NG 2600 Afebrile, VSS Labs OK Film this AM:  Nasogastric tube tip remains in the proximal stomach. Mild decrease in small bowel dilatation is seen with partial passage of oral contrast material. Colon remains nondilated. These findings are consistent with a partial small bowel obstruction  Intake/Output from previous day: 11/26 0701 - 11/27 0700 In: 3100 [I.V.:2100; IV Piggyback:1000] Out: 4275 [Urine:1675; Emesis/NG output:2600] Intake/Output this shift: Total I/O In: 120 [NG/GT:120] Out: 400 [Emesis/NG output:400]  General appearance: alert, cooperative and no distress Resp: clear to auscultation bilaterally  Abd:  Distended, no BS, some stool in the ostomy bag that is new.  No flatus  Lab Results:   Recent Labs  08/08/16 0429 08/09/16 0625  WBC 11.4* 6.6  HGB 13.2 11.8*  HCT 40.6 36.3*  PLT 255 217    BMET  Recent Labs  08/08/16 0429 08/09/16 0625  NA 144 145  K 4.0 3.6  CL 111 110  CO2 25 27  GLUCOSE 113* 96  BUN 14 13  CREATININE 0.94 1.01  CALCIUM 8.9 8.4*   PT/INR No results for input(s): LABPROT, INR in the last 72 hours.   Recent Labs Lab 08/02/16 1354 08/07/16 1800  AST 22 23  ALT 18 18  ALKPHOS 70 74  BILITOT 0.5 1.3*  PROT 7.2 8.0  ALBUMIN 3.9 4.2     Lipase     Component Value Date/Time   LIPASE 16 03/22/2016 2201     Studies/Results: Dg Abd 1 View  Result Date: 08/09/2016 CLINICAL  DATA:  Followup small bowel obstruction. EXAM: ABDOMEN - 1 VIEW COMPARISON:  08/08/2016 FINDINGS: Nasogastric tube tip remains in the proximal stomach. Mild decrease in small bowel dilatation is seen with partial passage of oral contrast material. Colon remains nondilated. These findings are consistent with a partial small bowel obstruction. IMPRESSION: Partial small bowel obstruction. Nasogastric tube in proximal stomach. Electronically Signed   By: Earle Gell M.D.   On: 08/09/2016 07:37   Ct Abdomen Pelvis W Contrast  Result Date: 08/07/2016 CLINICAL DATA:  Abdominal pain, vomiting, colon cancer, colostomy in place EXAM: CT ABDOMEN AND PELVIS WITH CONTRAST TECHNIQUE: Multidetector CT imaging of the abdomen and pelvis was performed using the standard protocol following bolus administration of intravenous contrast. CONTRAST:  157mL ISOVUE-300 IOPAMIDOL (ISOVUE-300) INJECTION 61% COMPARISON:  03/22/2016 FINDINGS: Lower chest: Lung bases shows no acute findings. Small hiatal hernia. Hepatobiliary: There is subtle low-density lesion in right hepatic lobe medially just posterior to IVC measures 1.1 cm. Metastatic disease cannot be excluded. Question second hepatic lesion in right hepatic lobe measures 7 mm. Further correlation with MRI is recommended to exclude metastatic disease. Pancreas: Mild atrophic pancreas without focal abnormality. Spleen: Enhanced spleen is unremarkable. Adrenals/Urinary Tract: No adrenal gland mass. Kidneys are symmetrical in size. Bilateral nonobstructive nephrolithiasis. No hydronephrosis. There is nonobstructive calcified calculus in proximal right ureter measures 4.5 mm. No calcified  calculi are noted within urinary bladder. Stomach/Bowel: There is no gastric outlet obstruction. Again noted postsurgical changes with anastomosis in small bowel in right upper abdomen. There is dilatation of proximal small bowel and mid small bowel with some fluid and air-fluid levels. A colostomy is  noted in left abdomen. Axial image fifty-eight there is transition point in caliber of small bowel in left abdomen just inferior to level of colostomy. Non distended small bowel loops are noted in left lower quadrant. Findings are consistent with complete small bowel obstruction. Findings may be due to adhesion or neoplastic involvement as there is a soft tissue density adjacent to small bowel loops left anterior fascia axial image 60. Metastatic disease cannot be excluded. Again noted a right paramedian ventral hernia containing omental fat without evidence of acute complication. There is a midline ventral hernia containing some omental fat without evidence of acute complication. Please note there is irregular soft tissue density within hernia measures 2.8 cm axial image 41. Metastatic seeding cannot be excluded. A second nodule within hernia axial image 49 measures 8 mm metastatic disease cannot be excluded. The colon entering the colostomy is totally decompressed. There is some residual stool within cecum. The transverse colon is decompressed. Vascular/Lymphatic:  No aortic aneurysm. Reproductive: Prostate gland and seminal vesicles are unremarkable. Other: No ascites or free abdominal air. Musculoskeletal: No destructive bony lesions are noted. Sagittal images of the spine shows mild degenerative changes thoracolumbar spine. IMPRESSION: 1. Findings consistent with small bowel obstruction with transition point in caliber of small bowel. There is total decompression of distal small bowel loops. Findings may be due to adhesion or neoplastic involvement as there is a soft tissue density in axial image 60 left anterior mesentery/fascia measures about 6.5 cm. 2. There is a ventral hernia in right mid abdomen. There is soft tissue density within hernia measures 2.8 cm. Metastatic seeding cannot be excluded. Clinical correlation is necessary. 3. A colostomy is noted in left abdomen. There is decompression of the distal  colon entering the colostomy. 4. No evidence of ascites or free abdominal air. 5. Question subtle low-density lesions within liver. Further correlation with MRI is recommended to exclude metastatic disease. 6. Small hiatal hernia. These results were called by telephone at the time of interpretation on 08/07/2016 at 8:30 pm to Dr. Montine Circle , who verbally acknowledged these results. Electronically Signed   By: Lahoma Crocker M.D.   On: 08/07/2016 20:31   Dg Abd Portable 1v-small Bowel Obstruction Protocol-initial, 8 Hr Delay  Result Date: 08/08/2016 CLINICAL DATA:  Small bowel obstruction EXAM: PORTABLE ABDOMEN - 1 VIEW COMPARISON:  CT scan 08/07/2016 FINDINGS: Contrast material noted within distended small bowel loops consistent with small bowel obstruction. Paucity of bowel gas within colon. Contrast material from recent CT scan noted within urinary bladder. IMPRESSION: Contrast material noted within distended small bowel loops consistent with small bowel obstruction. Electronically Signed   By: Lahoma Crocker M.D.   On: 08/08/2016 10:04   Dg Abd Portable 1v-small Bowel Protocol-position Verification  Result Date: 08/07/2016 CLINICAL DATA:  Status post enteric tube placement. EXAM: PORTABLE ABDOMEN - 1 VIEW COMPARISON:  CT abdomen 08/07/2016. FINDINGS: Enteric tube tip projects over the left upper quadrant. Prominent loops of small bowel demonstrated throughout the abdomen. Contrast material within the right renal collecting system. IMPRESSION: Enteric tube tip projects over the left upper quadrant, likely within the stomach. Electronically Signed   By: Lovey Newcomer M.D.   On: 08/07/2016 22:50   Prior to  Admission medications   Medication Sig Start Date End Date Taking? Authorizing Provider  fentaNYL (DURAGESIC - DOSED MCG/HR) 25 MCG/HR patch Place 1 patch (25 mcg total) onto the skin every 3 (three) days. 07/26/16  Yes Cammie Sickle, MD  lisinopril (PRINIVIL,ZESTRIL) 10 MG tablet Take 10 mg by  mouth daily.   Yes Historical Provider, MD  oxyCODONE (OXY IR/ROXICODONE) 5 MG immediate release tablet Take 1 tablet (5 mg total) by mouth every 4 (four) hours as needed for severe pain. 08/02/16  Yes Cammie Sickle, MD  promethazine (PHENERGAN) 25 MG tablet Take 1 tablet (25 mg total) by mouth every 6 (six) hours as needed for nausea or vomiting. 06/24/16  Yes Cammie Sickle, MD  zolpidem (AMBIEN) 10 MG tablet Take 10-15 mg by mouth at bedtime as needed for sleep.   Yes Historical Provider, MD    Medications: . fentaNYL  25 mcg Transdermal Q72H  . lip balm  1 application Topical BID   . 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  He did have some liquid stool in bag this AM.  They are going to change the bag so we can follow it better.  Continue NG, increase Fentanyl patch for better pain control.   Oncology to see today also. Checking urine, I do not see a calcification on the plain films. Not on heparin, recheck labs in AM.      LOS: 2 days    Gerlad Pelzel 08/09/2016 [phone]

## 2016-08-09 NOTE — Progress Notes (Signed)
   08/09/16 1000  Clinical Encounter Type  Visited With Patient and family together  Visit Type Initial  Referral From Nurse  Consult/Referral To Chaplain  Spiritual Encounters  Spiritual Needs Emotional  Stress Factors  Patient Stress Factors Major life changes  Family Stress Factors Health changes  CHP had lengthy visit with patient and patient's wife.  Patient going through difficult time with cancer reoccurrence diagnosis.  CHP provided presence, listening and prayer. Roe Coombs 08/09/16

## 2016-08-09 NOTE — Progress Notes (Signed)
PROGRESS NOTE    [name]  [id number] DOB: 25-Mar-1961 DOA: 08/07/2016 PCP: [name], MD    Brief Narrative:  55 y.o. gentleman with a history of Stage IVB metastatic colon cancer S/P colon resection with LUQ ostomy, sarcoidosis, HTN, significant neuropathy, and chronic pain who presents to the ED for evaluation of progressive abdominal pain associated with nausea and vomiting of feculent appearing emesis.  He reports he has really had intermittent abdominal pain for months, but is has been progressively worse over the past several days.  He is now unable to tolerate PO (last meal was yesterday).  He has not had documented fever, but he has had chills and sweats.  Ostomy output is down.  He has not had hematemesis.  No chest pain or shortness of breath.  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 surgery consulted. -Patient is continued with nothing by mouth status, NG tube to low wall suction -Appreciate input by Oncology and Surgery. Reviewed recommendations. Patient has decided on pursuing surgery, tentatively planned for 11/28 if no significant improvement by tomorrow -Patient is continued on basal IV fluids -Continue analgesics -Continue IVF hydration  Colon cancer -Patient followed by [name] at [facility] reviewed. Patient has failed multiple chemotherapy regimens in the past. -Patient is status post colostomy -Appreciate input by [name]. Multiple options were offered to patient, patient ultimately decided on surgical management for above SBO -Oncology recommends  pushing PET/CT about 3-4 weeks after surgery with close follow up with [name] -Recommendations for Palliative Care for further goals of care  Hypertension -Vital signs reviewed -Blood pressure remains stable -cont with current bp meds  Pulmonary sarcoidosis -Remains on min O2 support -presently stable  Insomnia -Continue with qhs PRN benadryl  Leukocytosis, likely reactive --Labs were reviewed. -Leukocytosis has resolved today -Repeat CBC in AM  DVT prophylaxis: SCD's Code Status: DO NOT RESUSCITATE Family Communication: Family at bedside Disposition Plan: Uncertain at this time  Consultants:   Oncology  General surgery  Procedures:     Antimicrobials: Anti-infectives    None      Subjective: Feels better today  Objective: Vitals:   08/08/16 1412 08/08/16 2126 08/09/16 0618 08/09/16 1400  BP: 122/75 (!) 147/75 (!) 145/68 (!) 145/76  Pulse: 70 (!) 58 81 63  Resp: 16 16 18 16   Temp: 97.7 F (36.5 C) 97.6 F (36.4 C) 98.2 F (36.8 C) 99 F (37.2 C)  TempSrc: Oral Oral Oral Oral  SpO2: 94% 95% 94% 95%  Weight:      Height:        Intake/Output Summary (Last 24 hours) at 08/09/16 1732 Last data filed at 08/09/16 1400  Gross per 24 hour  Intake          2133.33 ml  Output             3825 ml  Net         -1691.67 ml   Filed Weights   08/07/16 1634  Weight: 127 kg (280 lb)    Examination:  General exam: Lying in bed, no acute distress Respiratory system:  Normal chest rise, no audible wheezing Cardiovascular system: Regular rate, S1-S2 auscultated Gastrointestinal system: Less distended today, poor bowel sounds, generally tender Central nervous system: CN II through XII grossly intact, sensation intact Extremities: Perfused, no clubbing Skin: Normal skin turgor, no notable skin lesions seen Psychiatry: Mood normal, no visual hallucinations  Data Reviewed: I have personally reviewed following labs and imaging  studies  CBC:  Recent Labs Lab 08/07/16 1800 08/08/16 0429 08/09/16 0625  WBC 14.2* 11.4* 6.6  NEUTROABS 11.4*  --   --   HGB 14.2 13.2 11.8*  HCT 42.1 40.6 36.3*  MCV 87.7 89.4 88.8  PLT 290 255 [id number]   Basic Metabolic Panel:  Recent Labs Lab 08/07/16 1800 08/08/16 0429 08/09/16 0625  NA 139 144 145  K 3.9 4.0 3.6  CL 106 111 110  CO2 23 25 27   GLUCOSE 110* 113* 96  BUN 12 14 13   CREATININE 1.01 0.94 1.01  CALCIUM 9.4 8.9 8.4*   GFR: Estimated Creatinine Clearance: 110.9 mL/min (by C-G formula based on SCr of 1.01 mg/dL). Liver Function Tests:  Recent Labs Lab 08/07/16 1800  AST 23  ALT 18  ALKPHOS 74  BILITOT 1.3*  PROT 8.0  ALBUMIN 4.2   No results for input(s): LIPASE, AMYLASE in the last 168 hours. No results for input(s): AMMONIA in the last 168 hours. Coagulation Profile: No results for input(s): INR, PROTIME in the last 168 hours. Cardiac Enzymes: No results for input(s): CKTOTAL, CKMB, CKMBINDEX, TROPONINI in the last 168 hours. BNP (last 3 results) No results for input(s): PROBNP in the last 8760 hours. HbA1C: No results for input(s): HGBA1C in the last 72 hours. CBG:  Recent Labs Lab 08/07/16 2303  GLUCAP 118*   Lipid Profile: No results for input(s): CHOL, HDL, LDLCALC, TRIG, CHOLHDL, LDLDIRECT in the last 72 hours. Thyroid Function Tests: No results for input(s): TSH, T4TOTAL, FREET4, T3FREE, THYROIDAB in the last 72 hours. Anemia Panel: No results for input(s): VITAMINB12, FOLATE, FERRITIN, TIBC, IRON, RETICCTPCT in the last 72 hours. Sepsis Labs:  Recent Labs Lab 08/07/16 1809 08/07/16 2030  LATICACIDVEN 2.12* 1.30    No results found for this or any previous visit (from the past 240 hour(s)).   Radiology Studies: Dg Abd 1 View  Result Date: 08/09/2016 CLINICAL DATA:  Followup small bowel obstruction. EXAM: ABDOMEN - 1 VIEW COMPARISON:  08/08/2016 FINDINGS: Nasogastric tube tip remains in the proximal stomach. Mild  decrease in small bowel dilatation is seen with partial passage of oral contrast material. Colon remains nondilated. These findings are consistent with a partial small bowel obstruction. IMPRESSION: Partial small bowel obstruction. Nasogastric tube in proximal stomach. Electronically Signed   By: Earle Gell M.D.   On: 08/09/2016 07:37   Ct Abdomen Pelvis W Contrast  Result Date: 08/07/2016 CLINICAL DATA:  Abdominal pain, vomiting, colon cancer, colostomy in place EXAM: CT ABDOMEN AND PELVIS WITH CONTRAST TECHNIQUE: Multidetector CT imaging of the abdomen and pelvis was performed using the standard protocol following bolus administration of intravenous contrast. CONTRAST:  120mL ISOVUE-300 IOPAMIDOL (ISOVUE-300) INJECTION 61% COMPARISON:  03/22/2016 FINDINGS: Lower chest: Lung bases shows no acute findings. Small hiatal hernia. Hepatobiliary: There is subtle low-density lesion in right hepatic lobe medially just posterior to IVC measures 1.1 cm. Metastatic disease cannot be excluded. Question second hepatic lesion in right hepatic lobe measures 7 mm. Further correlation with MRI is recommended to exclude metastatic disease. Pancreas: Mild atrophic pancreas without focal abnormality. Spleen: Enhanced spleen is unremarkable. Adrenals/Urinary Tract:  No adrenal gland mass. Kidneys are symmetrical in size. Bilateral nonobstructive nephrolithiasis. No hydronephrosis. There is nonobstructive calcified calculus in proximal right ureter measures 4.5 mm. No calcified calculi are noted within urinary bladder. Stomach/Bowel: There is no gastric outlet obstruction. Again noted postsurgical changes with anastomosis in small bowel in right upper abdomen. There is dilatation of proximal small bowel and mid small bowel with some fluid and air-fluid levels. A colostomy is noted in left abdomen. Axial image fifty-eight there is transition point in caliber of small bowel in left abdomen just inferior to level of colostomy. Non  distended small bowel loops are noted in left lower quadrant. Findings are consistent with complete small bowel obstruction. Findings may be due to adhesion or neoplastic involvement as there is a soft tissue density adjacent to small bowel loops left anterior fascia axial image 60. Metastatic disease cannot be excluded. Again noted a right paramedian ventral hernia containing omental fat without evidence of acute complication. There is a midline ventral hernia containing some omental fat without evidence of acute complication. Please note there is irregular soft tissue density within hernia measures 2.8 cm axial image 41. Metastatic seeding cannot be excluded. A second nodule within hernia axial image 49 measures 8 mm metastatic disease cannot be excluded. The colon entering the colostomy is totally decompressed. There is some residual stool within cecum. The transverse colon is decompressed. Vascular/Lymphatic:  No aortic aneurysm. Reproductive: Prostate gland and seminal vesicles are unremarkable. Other: No ascites or free abdominal air. Musculoskeletal: No destructive bony lesions are noted. Sagittal images of the spine shows mild degenerative changes thoracolumbar spine. IMPRESSION: 1. Findings consistent with small bowel obstruction with transition point in caliber of small bowel. There is total decompression of distal small bowel loops. Findings may be due to adhesion or neoplastic involvement as there is a soft tissue density in axial image 60 left anterior mesentery/fascia measures about 6.5 cm. 2. There is a ventral hernia in right mid abdomen. There is soft tissue density within hernia measures 2.8 cm. Metastatic seeding cannot be excluded. Clinical correlation is necessary. 3. A colostomy is noted in left abdomen. There is decompression of the distal colon entering the colostomy. 4. No evidence of ascites or free abdominal air. 5. Question subtle low-density lesions within liver. Further correlation with  MRI is recommended to exclude metastatic disease. 6. Small hiatal hernia. These results were called by telephone at the time of interpretation on 08/07/2016 at 8:30 pm to Dr. Montine Circle , who verbally acknowledged these results. Electronically Signed   By: Lahoma Crocker M.D.   On: 08/07/2016 20:31   Dg Abd Portable 1v-small Bowel Obstruction Protocol-initial, 8 Hr Delay  Result Date: 08/08/2016 CLINICAL DATA:  Small bowel obstruction EXAM: PORTABLE ABDOMEN - 1 VIEW COMPARISON:  CT scan 08/07/2016 FINDINGS: Contrast material noted within distended small bowel loops consistent with small bowel obstruction. Paucity of bowel gas within colon. Contrast material from recent CT scan noted within urinary bladder. IMPRESSION: Contrast material noted within distended small bowel loops consistent with small bowel obstruction. Electronically Signed   By: Lahoma Crocker M.D.   On: 08/08/2016 10:04   Dg Abd Portable 1v-small Bowel Protocol-position Verification  Result Date: 08/07/2016 CLINICAL DATA:  Status post enteric tube placement. EXAM: PORTABLE ABDOMEN - 1 VIEW COMPARISON:  CT abdomen 08/07/2016. FINDINGS: Enteric tube tip projects over the left upper quadrant. Prominent loops of small bowel demonstrated throughout the abdomen. Contrast material within the right renal collecting system. IMPRESSION: Enteric tube tip  projects over the left upper quadrant, likely within the stomach. Electronically Signed   By: Lovey Newcomer M.D.   On: 08/07/2016 22:50    Scheduled Meds: . fentaNYL  50 mcg Transdermal Q72H  . lip balm  1 application Topical BID   Continuous Infusions: . sodium chloride 125 mL/hr at 08/09/16 1328     LOS: 2 days   CHIU, Orpah Melter, MD [facility] Pager [phone]  If 7PM-7AM, please contact night-coverage [email or web address] Password [facility] 08/09/2016, 5:32 PM

## 2016-08-09 NOTE — Consult Note (Addendum)
Marland Kitchen    HEMATOLOGY/ONCOLOGY CONSULTATION NOTE  Date of Service: 08/09/2016  Patient Care Team: [name], MD as PCP - General (Family Medicine) [name], MD as Consulting Physician (General Surgery) Cammie Sickle, MD as Consulting Physician (Oncology) Molli Barrows, MD as Consulting Physician (Pain Medicine) Festus Aloe, MD as Consulting Physician (Urology) Lucilla Lame, MD as Consulting Physician (Gastroenterology) Laurence Spates, MD as Consulting Physician (Gastroenterology)  CHIEF COMPLAINTS/PURPOSE OF CONSULTATION:   Abdominal pain and feculent smelling emesis  HISTORY OF PRESENTING ILLNESS:   [name] is a  55 y.o. male who has been referred to [place] by Dr [name] Hazel, MD for evaluation and management of progressive colon cancer with small bowel obstruction.  Patient has multiple medical comorbidities including history of stage IV colon cancer diagnosed in August 2016 when he presented with colonic obstruction requiring a colectomy and ostomy. He has had some palliative systemic chemotherapy as detailed in oncologic history below. Last received palliative chemotherapy in February 2017. He did not tolerate chemotherapy well due to his multiple medical comorbidities and has ostomy reversal was held due to poor overall health and high risk for disease progression. He was last seen by his primary oncologist Dr Yevette Edwards on 08/02/2016. He was documented to have NED status on imaging in July 2017 with recent CEA level on 08/02/2016 was elevated at 13.3 and a PET/CT was ordered which has been scheduled for 08/16/2016.  In the interim the patient got admitted through the emergency room on 08/07/2016 with progressive abdominal pain with nausea and vomiting of feculent material. He notes that he has had some chronic abdominal pain which worsened over the last several days and he was unable to tolerate any by mouth intake and his ostomy output dropped. No overt bleeding into  the ostomy or hematemesis. No chest pain or shortness of breath.  CT of the abdomen and pelvis with contrast on 08/07/2016 showed findings consistent with small bowel obstruction with transition point in caliber of small bowel. There is total decompression of distal small bowel loops. Findings may be due to adhesion or neoplastic involvement as there is a soft tissue density in axial image 60 left anterior mesentery/fascia measures about 6.5 cm. There is a ventral hernia in right mid abdomen. There is soft tissue density within hernia measures 2.8 cm. Metastatic seeding cannot be excluded. Clinical correlation is necessary. 3. A colostomy is noted in left abdomen. There is decompression of the distal colon entering the colostomy. 4. No evidence of ascites or free abdominal air. 5. Question subtle low-density lesions within liver. Further correlation with MRI is recommended to exclude metastatic disease.  Patient apparently has significant neuropathy at baseline thought to be related to possible sarcoidosis/vs idiopathic, obesity, GERD, arthritis and has had limited functional status at baseline and did not tolerate previous palliative chemotherapy too well.   ONCOLOGIC HISTORY Cancer of descending colon w obstruction s/p colectomy/ostomy 05/07/2015   Staging form: Colon and Rectum, AJCC 7th Edition   - Clinical: Stage IVB (T4b, N1, M1b) - Signed by Forest Gleason, MD on 05/20/2015   - Pathologic: No stage assigned - Unsigned       Oncology History   1.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 Specimen integrity: Intact. Macroscopic tumor perforation: Not identified. Invasive tumor: Maximum size: 5.5 cm. Histologic type(s): Invasive adenocarcinoma. Histologic grade and differentiation: G1: well differentiated/low grade  Pathologic Staging: pT4b, pN1b, pM1b MSI stable  K-ras wild-type not mutated by  [employer] study. 2.Patient was started on FOLFOX in September of 2016,after 2 cycles of chemotherapy patient developed neuropathy. Patient had a previous neuropathy as a baseline which increased so was switched over to FOLFIRI I IN December of 2016 As patient has clear os wild-type we can proceed to add either a Avastin or cetuximab from the next chemotherapy.(November, 2016)  Avastin was added as abdominal wound has completely healed now Avastin on hold (December, 2016) because of hypertension Chemotherapy was discontinued after protocol 8 cycle because of significant side effect (last chemotherapy was on October 28, 2015).    MEDICAL HISTORY:  Past Medical History:  Diagnosis Date  . Abdominal pain    mid abdomen  . Adiposity 03/21/2015  . Arthritis   . Besnier-Boeck disease ([place]) 12/21/2012  . Colon cancer ([place])   . 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 Extreme obesity ([place]) 12/21/2012  . GERD (gastroesophageal reflux disease)   . History of kidney stones   . History of transfusion   . Hypertension    has been off BP med x 6 months  . Iron deficiency anemia due to chronic blood loss 05/08/2015  . LBP (low back pain) 05/28/2014  . Neuropathy ([place])   . PONV (postoperative nausea and vomiting)   . Sarcoid ([place]) 10/2012  . Skin cancer    "it flairs up with sarcoidosis"    SURGICAL HISTORY: Past Surgical History:  Procedure Laterality Date  . CARDIAC CATHETERIZATION    . FLEXIBLE SIGMOIDOSCOPY N/A 05/07/2015   Procedure: FLEXIBLE SIGMOIDOSCOPY;  Surgeon: Laurence Spates, MD;  Location: [facility];  Service: Endoscopy;  Laterality: N/A;  . LAPAROSCOPIC PARTIAL COLECTOMY N/A 05/07/2015   Procedure: LAPAROSCOPIC ASSITED PARTIAL COLECTOMY WITH COLOSTOMY, SMALL BOWEL RESECTION, EXCISION OF PERITONEAL NODULE;  Surgeon: [name], MD;  Location: [facility];  Service: General;  Laterality: N/A;  . PORTACATH PLACEMENT Right 05/27/2015   Procedure: INSERTION  PORT-A-CATH;  Surgeon: [name], MD;  Location: [facility];  Service: General;  Laterality: Right;    SOCIAL HISTORY: Social History   Social History  . Marital status: Married    Spouse name: N/A  . Number of children: N/A  . Years of education: N/A   Occupational History  . Not on file.   Social History Main Topics  . Smoking status: Passive Smoke Exposure - Never Smoker  . Smokeless tobacco: Never Used  . Alcohol use No  . Drug use: No  . Sexual activity: Not on file   Other Topics Concern  . Not on file   Social History Narrative  . No narrative on file    FAMILY HISTORY: Family History  Problem Relation Age of Onset  . Arthritis Mother   . Heart disease Mother   . Stroke Father   . Prostate cancer Neg Hx   . Bladder Cancer Neg Hx     ALLERGIES:  is allergic to versed [midazolam]; gabapentin; and paba derivatives.  MEDICATIONS:  Current Facility-Administered Medications  Medication Dose Route Frequency Provider Last Rate Last Dose  . 0.9 %  sodium chloride infusion   Intravenous Continuous Lily Kocher, MD 100 mL/hr at 08/09/16 0754    . acetaminophen (TYLENOL) suppository 650 mg  650 mg Rectal Q6H PRN Michael Boston, MD      . albuterol (PROVENTIL) (2.5 MG/3ML) 0.083% nebulizer solution 2.5 mg  2.5 mg Nebulization Q6H PRN Michael Boston, MD      . diphenhydrAMINE (BENADRYL) injection 25 mg  25 mg Intravenous QHS PRN [name] Hazel, MD   25 mg at 08/08/16 2137  . fentaNYL (DURAGESIC - dosed mcg/hr) patch 25 mcg  25 mcg Transdermal Q72H Lily Kocher, MD   25 mcg at 08/09/16 0521  . HYDROmorphone (DILAUDID) injection 0.5-2 mg  0.5-2 mg Intravenous Q2H PRN Michael Boston, MD   2 mg at 08/09/16 0725  . lactated ringers bolus 1,000 mL  1,000 mL Intravenous Q8H PRN Michael Boston, MD      . lip balm (CARMEX) ointment 1 application  1 application Topical BID Michael Boston, MD   1 application at 83/38/25 1000  . LORazepam (ATIVAN) injection 0.5-1 mg  0.5-1 mg Intravenous Q8H  PRN Michael Boston, MD      . magic mouthwash  15 mL Oral QID PRN Michael Boston, MD      . menthol-cetylpyridinium (CEPACOL) lozenge 3 mg  1 lozenge Oral PRN Michael Boston, MD      . methocarbamol (ROBAXIN) 1,000 mg in dextrose 5 % 50 mL IVPB  1,000 mg Intravenous Q6H PRN Michael Boston, MD      . metoCLOPramide (REGLAN) injection 5-10 mg  5-10 mg Intravenous Q6H PRN Michael Boston, MD      . metoprolol (LOPRESSOR) injection 5 mg  5 mg Intravenous Q6H PRN Michael Boston, MD      . ondansetron [facility]) injection 4 mg  4 mg Intravenous Q6H PRN Michael Boston, MD   4 mg at 08/08/16 1605   Or  . ondansetron (ZOFRAN) 8 mg in sodium chloride 0.9 % 50 mL IVPB  8 mg Intravenous Q6H PRN Michael Boston, MD      . phenol (CHLORASEPTIC) mouth spray 2 spray  2 spray Mouth/Throat PRN Michael Boston, MD      . prochlorperazine (COMPAZINE) injection 5-10 mg  5-10 mg Intravenous Q4H PRN Michael Boston, MD   10 mg at 08/08/16 1155  . sodium chloride flush (NS) 0.9 % injection 10-40 mL  10-40 mL Intracatheter PRN [name] Hazel, MD   20 mL at 08/09/16 0155   Facility-Administered Medications Ordered in Other Encounters  Medication Dose Route Frequency Provider Last Rate Last Dose  . sodium chloride 0.9 % injection 10 mL  10 mL Intracatheter PRN Forest Gleason, MD   10 mL at 08/02/15 0955  . sodium chloride flush (NS) 0.9 % injection 10 mL  10 mL Intravenous PRN Forest Gleason, MD   10 mL at 02/11/16 1326    REVIEW OF SYSTEMS:    10 Point review of Systems was done is negative except as noted above.  PHYSICAL EXAMINATION: ECOG PERFORMANCE STATUS:   . Vitals:   08/08/16 2126 08/09/16 0618  BP: (!) 147/75 (!) 145/68  Pulse: (!) 58 81  Resp: 16 18  Temp: 97.6 F (36.4 C) 98.2 F (36.8 C)   Filed Weights   08/07/16 1634  Weight: 280 lb (127 kg)   .Body mass index is 39.05 kg/m.  GENERAL:alert, in no acute distress and comfortable, Somewhat anxious as expected. Wife at bedside SKIN: No acute rashes EYES: conjunctiva  are pink and non-injected, sclera clear OROPHARYNX: NG tube in situ, large amounts of brown aspirate in vacuum container  NECK: supple no JVD LYMPH:  no palpable lymphadenopathy in the cervical, axillary or inguinal LUNGS: Distant breath sounds, clear to auscultation with normal respiratory effort HEART: regular rate & rhythm ABDOMEN: abdomen mildly distended , hyperactive bowel sounds in the upper abdomen, obese, colostomy bag in situ with some liquid brown  stools, PSYCH: alert & oriented x 3 with fluent speech NEURO: no focal motor/sensory deficits  LABORATORY DATA:  I have reviewed the data as listed  . CBC Latest Ref Rng & Units 08/09/2016 08/08/2016 08/07/2016  WBC 4.0 - 10.5 K/uL 6.6 11.4(H) 14.2(H)  Hemoglobin 13.0 - 17.0 g/dL 11.8(L) 13.2 14.2  Hematocrit 39.0 - 52.0 % 36.3(L) 40.6 42.1  Platelets 150 - 400 K/uL 217 255 290    . CMP Latest Ref Rng & Units 08/09/2016 08/08/2016 08/07/2016  Glucose 65 - 99 mg/dL 96 113(H) 110(H)  BUN 6 - 20 mg/dL _0 Creatinine 0.61 - 1.24 mg/dL 1.01 0.94 1.01  Sodium 135 - 145 mmol/L 145 144 139  Potassium 3.5 - 5.1 mmol/L 3.6 4.0 3.9  Chloride 101 - 111 mmol/L 110 111 106  CO2 22 - 32 mmol/L _1 Calcium 8.9 - 10.3 mg/dL 8.4(L) 8.9 9.4  Total Protein 6.5 - 8.1 g/dL - - 8.0  Total Bilirubin 0.3 - 1.2 mg/dL - - 1.3(H)  Alkaline Phos 38 - 126 U/L - - 74  AST 15 - 41 U/L - - 23  ALT 17 - 63 U/L - - 18      RADIOGRAPHIC STUDIES: I have personally reviewed the radiological images as listed and agreed with the findings in the report. Dg Abd 1 View  Result Date: 08/09/2016 CLINICAL DATA:  Followup small bowel obstruction. EXAM: ABDOMEN - 1 VIEW COMPARISON:  08/08/2016 FINDINGS: Nasogastric tube tip remains in the proximal stomach. Mild decrease in small bowel dilatation is seen with partial passage of oral contrast material. Colon remains nondilated. These findings are consistent with a partial small bowel obstruction.  IMPRESSION: Partial small bowel obstruction. Nasogastric tube in proximal stomach. Electronically Signed   By: Earle Gell M.D.   On: 08/09/2016 07:37   Ct Abdomen Pelvis W Contrast  Result Date: 08/07/2016 CLINICAL DATA:  Abdominal pain, vomiting, colon cancer, colostomy in place EXAM: CT ABDOMEN AND PELVIS WITH CONTRAST TECHNIQUE: Multidetector CT imaging of the abdomen and pelvis was performed using the standard protocol following bolus administration of intravenous contrast. CONTRAST:  120m ISOVUE-300 IOPAMIDOL (ISOVUE-300) INJECTION 61% COMPARISON:  03/22/2016 FINDINGS: Lower chest: Lung bases shows no acute findings. Small hiatal hernia. Hepatobiliary: There is subtle low-density lesion in right hepatic lobe medially just posterior to IVC measures 1.1 cm. Metastatic disease cannot be excluded. Question second hepatic lesion in right hepatic lobe measures 7 mm. Further correlation with MRI is recommended to exclude metastatic disease. Pancreas: Mild atrophic pancreas without focal abnormality. Spleen: Enhanced spleen is unremarkable. Adrenals/Urinary Tract: No adrenal gland mass. Kidneys are symmetrical in size. Bilateral nonobstructive nephrolithiasis. No hydronephrosis. There is nonobstructive calcified calculus in proximal right ureter measures 4.5 mm. No calcified calculi are noted within urinary bladder. Stomach/Bowel: There is no gastric outlet obstruction. Again noted postsurgical changes with anastomosis in small bowel in right upper abdomen. There is dilatation of proximal small bowel and mid small bowel with some fluid and air-fluid levels. A colostomy is noted in left abdomen. Axial image fifty-eight there is transition point in caliber of small bowel in left abdomen just inferior to level of colostomy. Non distended small bowel loops are noted in left lower quadrant. Findings are consistent with complete small bowel obstruction. Findings may be due to adhesion or neoplastic involvement as  there is a soft tissue density adjacent to small bowel loops left anterior fascia axial image 60. Metastatic disease cannot be excluded. Again noted a  right paramedian ventral hernia containing omental fat without evidence of acute complication. There is a midline ventral hernia containing some omental fat without evidence of acute complication. Please note there is irregular soft tissue density within hernia measures 2.8 cm axial image 41. Metastatic seeding cannot be excluded. A second nodule within hernia axial image 49 measures 8 mm metastatic disease cannot be excluded. The colon entering the colostomy is totally decompressed. There is some residual stool within cecum. The transverse colon is decompressed. Vascular/Lymphatic:  No aortic aneurysm. Reproductive: Prostate gland and seminal vesicles are unremarkable. Other: No ascites or free abdominal air. Musculoskeletal: No destructive bony lesions are noted. Sagittal images of the spine shows mild degenerative changes thoracolumbar spine. IMPRESSION: 1. Findings consistent with small bowel obstruction with transition point in caliber of small bowel. There is total decompression of distal small bowel loops. Findings may be due to adhesion or neoplastic involvement as there is a soft tissue density in axial image 60 left anterior mesentery/fascia measures about 6.5 cm. 2. There is a ventral hernia in right mid abdomen. There is soft tissue density within hernia measures 2.8 cm. Metastatic seeding cannot be excluded. Clinical correlation is necessary. 3. A colostomy is noted in left abdomen. There is decompression of the distal colon entering the colostomy. 4. No evidence of ascites or free abdominal air. 5. Question subtle low-density lesions within liver. Further correlation with MRI is recommended to exclude metastatic disease. 6. Small hiatal hernia. These results were called by telephone at the time of interpretation on 08/07/2016 at 8:30 pm to Dr. Montine Circle , who verbally acknowledged these results. Electronically Signed   By: Lahoma Crocker M.D.   On: 08/07/2016 20:31   Dg Abd Portable 1v-small Bowel Obstruction Protocol-initial, 8 Hr Delay  Result Date: 08/08/2016 CLINICAL DATA:  Small bowel obstruction EXAM: PORTABLE ABDOMEN - 1 VIEW COMPARISON:  CT scan 08/07/2016 FINDINGS: Contrast material noted within distended small bowel loops consistent with small bowel obstruction. Paucity of bowel gas within colon. Contrast material from recent CT scan noted within urinary bladder. IMPRESSION: Contrast material noted within distended small bowel loops consistent with small bowel obstruction. Electronically Signed   By: Lahoma Crocker M.D.   On: 08/08/2016 10:04   Dg Abd Portable 1v-small Bowel Protocol-position Verification  Result Date: 08/07/2016 CLINICAL DATA:  Status post enteric tube placement. EXAM: PORTABLE ABDOMEN - 1 VIEW COMPARISON:  CT abdomen 08/07/2016. FINDINGS: Enteric tube tip projects over the left upper quadrant. Prominent loops of small bowel demonstrated throughout the abdomen. Contrast material within the right renal collecting system. IMPRESSION: Enteric tube tip projects over the left upper quadrant, likely within the stomach. Electronically Signed   By: Lovey Newcomer M.D.   On: 08/07/2016 22:50    ASSESSMENT & PLAN:   1) Stage IVB  (T4b, N1, M1b) Carcinoma of descending colon status post resection with colostomy obstructing mass diagnosed in August 2016. MSI stable K-ras wild-type not mutated by [employer] study. Patient has had poor tolerance to palliative chemotherapy previously due to neuropathy that limited Oxaloplatin use, hypertension that limited Avastin use and multiple adverse affects to FOLFIRI. He received a total of 2 cycles of FOLFOX and 8 cycles of FOLFIRI with his last treatment being in February 2017. He was thought to have NED status in July 2017.  He still has his colostomy - the reversal of this was placed on  hold due to his poor overall performance status. Currently his ECOG performance status is 3.  2)  Complete small bowel obstruction based on his symptoms large NJ output and CT scan findings. This could be due to adhesions from prior surgery versus concern for neoplastic involvement (in setting of CT findings and increasing CEA levels) PLAN -I met with the patient today and also discussed his case with his primary oncologist Dr. Yevette Edwards (At [facility]). -Dr Yevette Edwards noted that he has not had a chance to have a detailed goals of care discussion with the patient. He was of the opinion that given his NED status in July 2017 and a relatively slowly progressing disease and the possibility of additional palliative treatment options - he would recommend proceeding with surgical management of the small bowel obstruction if deemed necessary. -I discussed with the patient these understanding and also the fact that though there are possible palliative treatment options depending on his functional status after surgery he might not be a good candidate for this. -We discussed whether he would want to proceed with best supportive cares through hospice setting at this time and he noted that he would like to consider other options that might allow him to live longer and be able to eat. -He understands that surgery for his small bowel obstruction might be fairly high risk but would like to explore this option if his small bowel obstruction does not abate in the next day or 2.  -He also understands that at the surgeon's do proceed with surgery there is a possibility that once they go in and have a look that he could have more extensive disease that might not be surgically correctable which might lead to aborting his surgery. Alternatively it's possible that he might have in addition that could be addressed more easily than expected though this is less likely. -He notes that he will think about our discussion further to see if  he has any other questions for [place]. All his and his wife's questions were answered in details to the best of my abilities. -If he does have surgery for small bowel obstruction might need to push out his PET/CT scan about 3-4 weeks after surgery to avoid false-positive local abdominal findings due to inflammation from surgery. -He will need follow-up with Dr Yevette Edwards upon discharge from the hospital. -Palliative care consultation recommended for further definition of goals of care.  We shall continue to follow peripherally. Please call if any other additional questions arise.  All of the patients questions were answered with apparent satisfaction. The patient knows to call the clinic with any problems, questions or concerns.  I spent 65 minutes counseling the patient face to face. The total time spent in the appointment was 80 minutes and more than 50% was on counseling and direct patient cares.    Sullivan Lone MD [place] AAHIVMS [facility] Hematology/Oncology Physician [facility]  (Office):       [phone] (Work cell):  [phone] (Fax):           [phone]  08/09/2016 8:33 AM

## 2016-08-10 ENCOUNTER — Inpatient Hospital Stay (HOSPITAL_COMMUNITY): Payer: BLUE CROSS/BLUE SHIELD

## 2016-08-10 ENCOUNTER — Inpatient Hospital Stay (HOSPITAL_COMMUNITY): Payer: BLUE CROSS/BLUE SHIELD | Admitting: Anesthesiology

## 2016-08-10 ENCOUNTER — Encounter (HOSPITAL_COMMUNITY)
Admission: EM | Disposition: A | Discharge status: Home / Self Care | Payer: Self-pay | Source: Home / Self Care | Attending: Internal Medicine

## 2016-08-10 ENCOUNTER — Encounter (HOSPITAL_COMMUNITY): Payer: Self-pay | Admitting: Anesthesiology

## 2016-08-10 DIAGNOSIS — M5432 Sciatica, left side: Secondary | ICD-10-CM

## 2016-08-10 DIAGNOSIS — M5431 Sciatica, right side: Secondary | ICD-10-CM

## 2016-08-10 HISTORY — PX: LAPAROTOMY: SHX154

## 2016-08-10 LAB — CBC
HCT: 35.3 % — ABNORMAL LOW (ref 39.0–52.0)
Hemoglobin: 11 g/dL — ABNORMAL LOW (ref 13.0–17.0)
MCH: 28.6 pg (ref 26.0–34.0)
MCHC: 31.2 g/dL (ref 30.0–36.0)
MCV: 91.7 fL (ref 78.0–100.0)
PLATELETS: 222 10*3/uL (ref 150–400)
RBC: 3.85 MIL/uL — AB (ref 4.22–5.81)
RDW: 14 % (ref 11.5–15.5)
WBC: 6.8 10*3/uL (ref 4.0–10.5)

## 2016-08-10 LAB — APTT: aPTT: 32 seconds (ref 24–36)

## 2016-08-10 LAB — TYPE AND SCREEN
ABO/RH(D): O POS
Antibody Screen: NEGATIVE

## 2016-08-10 LAB — SURGICAL PCR SCREEN
MRSA, PCR: NEGATIVE
Staphylococcus aureus: NEGATIVE

## 2016-08-10 LAB — PROTIME-INR
INR: 1.24
PROTHROMBIN TIME: 15.7 s — AB (ref 11.4–15.2)

## 2016-08-10 SURGERY — LAPAROTOMY, EXPLORATORY
Anesthesia: General | Site: Abdomen

## 2016-08-10 MED ORDER — KETAMINE HCL 10 MG/ML IJ SOLN
INTRAMUSCULAR | Status: DC | PRN
  Administered 2016-08-10: 10 mg via INTRAVENOUS
  Administered 2016-08-10: 30 mg via INTRAVENOUS
  Administered 2016-08-10: 10 mg via INTRAVENOUS
  Administered 2016-08-10: 20 mg via INTRAVENOUS
  Administered 2016-08-10: 10 mg via INTRAVENOUS
  Administered 2016-08-10: 20 mg via INTRAVENOUS

## 2016-08-10 MED ORDER — FENTANYL CITRATE (PF) 100 MCG/2ML IJ SOLN
INTRAMUSCULAR | Status: DC | PRN
  Administered 2016-08-10 (×4): 50 ug via INTRAVENOUS

## 2016-08-10 MED ORDER — LACTATED RINGERS IV SOLN
INTRAVENOUS | Status: DC | PRN
  Administered 2016-08-10: 16:00:00 via INTRAVENOUS

## 2016-08-10 MED ORDER — SUGAMMADEX SODIUM 500 MG/5ML IV SOLN
INTRAVENOUS | Status: DC | PRN
  Administered 2016-08-10: 500 mg via INTRAVENOUS

## 2016-08-10 MED ORDER — GLYCOPYRROLATE 0.2 MG/ML IV SOSY
PREFILLED_SYRINGE | INTRAVENOUS | Status: DC | PRN
  Administered 2016-08-10: .1 mg via INTRAVENOUS
  Administered 2016-08-10: .2 mg via INTRAVENOUS

## 2016-08-10 MED ORDER — HYDROMORPHONE HCL 1 MG/ML IJ SOLN
INTRAMUSCULAR | Status: AC
  Administered 2016-08-10: 2 mg
  Filled 2016-08-10: qty 1

## 2016-08-10 MED ORDER — ACETAMINOPHEN 10 MG/ML IV SOLN
1000.0000 mg | Freq: Four times a day (QID) | INTRAVENOUS | Status: DC
  Administered 2016-08-10: 1000 mg via INTRAVENOUS

## 2016-08-10 MED ORDER — ACETAMINOPHEN 10 MG/ML IV SOLN
INTRAVENOUS | Status: AC
  Filled 2016-08-10: qty 100

## 2016-08-10 MED ORDER — MEPERIDINE HCL 50 MG/ML IJ SOLN
INTRAMUSCULAR | Status: AC
  Filled 2016-08-10: qty 1

## 2016-08-10 MED ORDER — HYDROMORPHONE HCL 1 MG/ML IJ SOLN
0.5000 mg | INTRAMUSCULAR | Status: DC | PRN
  Administered 2016-08-10 – 2016-08-11 (×7): 2 mg via INTRAVENOUS
  Administered 2016-08-11: 1 mg via INTRAVENOUS
  Administered 2016-08-11 (×5): 2 mg via INTRAVENOUS
  Administered 2016-08-11: 1 mg via INTRAVENOUS
  Administered 2016-08-12 (×9): 2 mg via INTRAVENOUS
  Filled 2016-08-10 (×23): qty 2

## 2016-08-10 MED ORDER — PROMETHAZINE HCL 25 MG/ML IJ SOLN: 6.2500 mg | INTRAMUSCULAR | Status: DC | PRN

## 2016-08-10 MED ORDER — 0.9 % SODIUM CHLORIDE (POUR BTL) OPTIME
TOPICAL | Status: DC | PRN
  Administered 2016-08-10: 2000 mL

## 2016-08-10 MED ORDER — DEXAMETHASONE SODIUM PHOSPHATE 10 MG/ML IJ SOLN
INTRAMUSCULAR | Status: DC | PRN
  Administered 2016-08-10: 10 mg via INTRAVENOUS

## 2016-08-10 MED ORDER — ENALAPRILAT 1.25 MG/ML IV SOLN
0.6250 mg | Freq: Four times a day (QID) | INTRAVENOUS | Status: DC
  Administered 2016-08-10 – 2016-08-18 (×32): 0.625 mg via INTRAVENOUS
  Filled 2016-08-10 (×36): qty 0.5

## 2016-08-10 MED ORDER — HYDROMORPHONE HCL 1 MG/ML IJ SOLN
INTRAMUSCULAR | Status: AC
  Filled 2016-08-10: qty 1

## 2016-08-10 MED ORDER — HYDRALAZINE HCL 20 MG/ML IJ SOLN: 10.0000 mg | INTRAMUSCULAR | Status: DC | PRN

## 2016-08-10 MED ORDER — PROPOFOL 10 MG/ML IV BOLUS
INTRAVENOUS | Status: DC | PRN
  Administered 2016-08-10: 200 mg via INTRAVENOUS

## 2016-08-10 MED ORDER — MEPERIDINE HCL 50 MG/ML IJ SOLN
6.2500 mg | INTRAMUSCULAR | Status: DC | PRN
  Administered 2016-08-10 (×2): 12.5 mg via INTRAVENOUS

## 2016-08-10 MED ORDER — DEXTROSE 5 % IV SOLN
2.0000 g | INTRAVENOUS | Status: AC
  Administered 2016-08-10: 2 g via INTRAVENOUS
  Filled 2016-08-10: qty 2

## 2016-08-10 MED ORDER — KETOROLAC TROMETHAMINE 15 MG/ML IJ SOLN
INTRAMUSCULAR | Status: AC
  Filled 2016-08-10: qty 1

## 2016-08-10 MED ORDER — HYDROMORPHONE HCL 1 MG/ML IJ SOLN
0.2500 mg | INTRAMUSCULAR | Status: AC | PRN
  Administered 2016-08-10 (×8): 0.5 mg via INTRAVENOUS

## 2016-08-10 MED ORDER — SUCCINYLCHOLINE CHLORIDE 200 MG/10ML IV SOSY
PREFILLED_SYRINGE | INTRAVENOUS | Status: DC | PRN
  Administered 2016-08-10: 200 mg via INTRAVENOUS

## 2016-08-10 MED ORDER — LIDOCAINE 2% (20 MG/ML) 5 ML SYRINGE
INTRAMUSCULAR | Status: DC | PRN
  Administered 2016-08-10: 50 mg via INTRAVENOUS

## 2016-08-10 MED ORDER — LACTATED RINGERS IV SOLN
INTRAVENOUS | Status: DC | PRN
  Administered 2016-08-10 (×3): via INTRAVENOUS

## 2016-08-10 MED ORDER — ROCURONIUM BROMIDE 10 MG/ML (PF) SYRINGE
PREFILLED_SYRINGE | INTRAVENOUS | Status: DC | PRN
  Administered 2016-08-10 (×4): 10 mg via INTRAVENOUS
  Administered 2016-08-10: 50 mg via INTRAVENOUS
  Administered 2016-08-10: 10 mg via INTRAVENOUS
  Administered 2016-08-10: 20 mg via INTRAVENOUS
  Administered 2016-08-10 (×2): 10 mg via INTRAVENOUS

## 2016-08-10 SURGICAL SUPPLY — 44 items
APPLICATOR COTTON TIP 6IN STRL (MISCELLANEOUS) IMPLANT
APPLIER CLIP 11 MED OPEN (CLIP) ×2
BLADE EXTENDED COATED 6.5IN (ELECTRODE) IMPLANT
BLADE HEX COATED 2.75 (ELECTRODE) ×2 IMPLANT
CLIP APPLIE 11 MED OPEN (CLIP) ×1 IMPLANT
COVER MAYO STAND STRL (DRAPES) IMPLANT
COVER SURGICAL LIGHT HANDLE (MISCELLANEOUS) ×2 IMPLANT
DRAIN CHANNEL 19F RND (DRAIN) ×2 IMPLANT
DRAPE INCISE IOBAN 66X45 STRL (DRAPES) ×2 IMPLANT
DRAPE LAPAROSCOPIC ABDOMINAL (DRAPES) ×2 IMPLANT
DRAPE WARM FLUID 44X44 (DRAPE) ×2 IMPLANT
ELECT REM PT RETURN 9FT ADLT (ELECTROSURGICAL) ×2
ELECTRODE REM PT RTRN 9FT ADLT (ELECTROSURGICAL) ×1 IMPLANT
GAUZE SPONGE 4X4 12PLY STRL (GAUZE/BANDAGES/DRESSINGS) ×2 IMPLANT
GLOVE BIOGEL PI IND STRL 7.0 (GLOVE) ×1 IMPLANT
GLOVE BIOGEL PI INDICATOR 7.0 (GLOVE) ×1
GOWN STRL REUS W/TWL LRG LVL3 (GOWN DISPOSABLE) ×2 IMPLANT
GOWN STRL REUS W/TWL XL LVL3 (GOWN DISPOSABLE) ×4 IMPLANT
HANDLE SUCTION POOLE (INSTRUMENTS) ×1 IMPLANT
KIT BASIN OR (CUSTOM PROCEDURE TRAY) ×2 IMPLANT
NS IRRIG 1000ML POUR BTL (IV SOLUTION) ×4 IMPLANT
PACK GENERAL/GYN (CUSTOM PROCEDURE TRAY) ×2 IMPLANT
PAD ABD 8X10 STRL (GAUZE/BANDAGES/DRESSINGS) ×2 IMPLANT
RELOAD PROXIMATE 75MM BLUE (ENDOMECHANICALS) ×2 IMPLANT
SEALER TISSUE X1 CVD JAW (INSTRUMENTS) ×2 IMPLANT
SPONGE LAP 18X18 X RAY DECT (DISPOSABLE) ×4 IMPLANT
STAPLER GUN LINEAR PROX 60 (STAPLE) ×2 IMPLANT
STAPLER PROXIMATE 75MM BLUE (STAPLE) ×2 IMPLANT
STAPLER VISISTAT 35W (STAPLE) ×2 IMPLANT
SUCTION POOLE HANDLE (INSTRUMENTS) ×2
SUT ETHILON 3 0 PS 1 (SUTURE) ×2 IMPLANT
SUT NOVA NAB DX-16 0-1 5-0 T12 (SUTURE) ×2 IMPLANT
SUT PDS AB 1 CTX 36 (SUTURE) IMPLANT
SUT PDS AB 1 TP1 96 (SUTURE) ×4 IMPLANT
SUT SILK 2 0 (SUTURE)
SUT SILK 2 0 SH CR/8 (SUTURE) ×2 IMPLANT
SUT SILK 2-0 18XBRD TIE 12 (SUTURE) IMPLANT
SUT SILK 3 0 (SUTURE)
SUT SILK 3 0 SH CR/8 (SUTURE) ×4 IMPLANT
SUT SILK 3-0 18XBRD TIE 12 (SUTURE) IMPLANT
TAPE CLOTH SURG 6X10 WHT LF (GAUZE/BANDAGES/DRESSINGS) ×2 IMPLANT
TOWEL OR 17X26 10 PK STRL BLUE (TOWEL DISPOSABLE) ×4 IMPLANT
TRAY FOLEY W/METER SILVER 16FR (SET/KITS/TRAYS/PACK) ×2 IMPLANT
YANKAUER SUCT BULB TIP NO VENT (SUCTIONS) IMPLANT

## 2016-08-10 NOTE — Anesthesia Procedure Notes (Signed)
Procedure Name: Intubation Date/Time: 08/10/2016 3:50 PM Performed by: Laprecious Austill, Virgel Gess Pre-anesthesia Checklist: Patient identified, Emergency Drugs available, Suction available, Patient being monitored and Timeout performed Patient Re-evaluated:Patient Re-evaluated prior to inductionOxygen Delivery Method: Circle system utilized Preoxygenation: Pre-oxygenation with 100% oxygen Intubation Type: IV induction, Rapid sequence and Cricoid Pressure applied Ventilation: Mask ventilation without difficulty Laryngoscope Size: Glidescope and 4 Grade View: Grade II Tube type: Oral Tube size: 7.0 mm Number of attempts: 1 Airway Equipment and Method: Stylet and Video-laryngoscopy Placement Confirmation: ETT inserted through vocal cords under direct vision,  breath sounds checked- equal and bilateral and positive ETCO2 Secured at: 23 cm Tube secured with: Tape Dental Injury: Teeth and Oropharynx as per pre-operative assessment  Comments: Elective glidecope

## 2016-08-10 NOTE — Transfer of Care (Signed)
Immediate Anesthesia Transfer of Care Note  Patient: [name]  Procedure(s) Performed: Procedure(s): EXPLORATORY LAPAROTOMY small bowel resection abdominal wall resection partial omentectomy (N/A)  Patient Location: PACU  Anesthesia Type:General  Level of Consciousness:  sedated, patient cooperative and responds to stimulation  Airway & Oxygen Therapy:Patient Spontanous Breathing and Patient connected to face mask oxgen  Post-op Assessment:  Report given to PACU RN and Post -op Vital signs reviewed and stable  Post vital signs:  Reviewed and stable  Last Vitals:  Vitals:   08/10/16 1408 08/10/16 1902  BP:    Pulse: 90 (P) 98  Resp: 17 (P) 12  Temp: 36.7 C (P) [id number] C    Complications: No apparent anesthesia complications

## 2016-08-10 NOTE — Progress Notes (Addendum)
PROGRESS NOTE    [name]  [id number] DOB: 1960/11/14 DOA: 08/07/2016 PCP: [name], MD    Brief Narrative:  55 y.o. gentleman with a history of Stage IVB metastatic colon cancer S/P colon resection with LUQ ostomy, sarcoidosis, HTN, significant neuropathy, and chronic pain who presents to the ED for evaluation of progressive abdominal pain associated with nausea and vomiting of feculent appearing emesis.  He reports he has really had intermittent abdominal pain for months, but is has been progressively worse over the past several days.  He is now unable to tolerate PO (last meal was yesterday).  He has not had documented fever, but he has had chills and sweats.  Ostomy output is down.  He has not had hematemesis.  No chest pain or shortness of breath.  General surgery was consulted. fter evaluation by oncology, patient and family had decided upon surgical intervention. 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 surgery is following -Patient is continued with nothing by mouth status, NG tube to low wall suction -Appreciate input by Oncology and Surgery.  -No significant improvement this morning. Thus, patient is now planned for surgery today -Patient is continued on basal IV fluids -Continue analgesics -Continue IVF hydration  Colon cancer -Patient followed by [name] at [place] reviewed. Patient has failed multiple chemotherapy regimens in the past. -Patient is status post colostomy -Appreciate input by [name]. Multiple options were  offered to patient, patient ultimately decided on surgical management for above SBO -Oncology recommends pushing PET/CT about 3-4 weeks after surgery with close follow up with [name] -Recommendations for Palliative Care for further goals of care  Hypertension -Vital signs reviewed -Blood pressure remains stable, albeit suboptimal-suspect increased blood pressure secondary to discomfort from small bowel obstruction -cont with current bp meds  Pulmonary sarcoidosis -Remains on min O2 support -presently stable  Insomnia -Continue with qhs PRN benadryl  Leukocytosis, likely reactive --Labs were reviewed. -Leukocytosis has resolved today -Repeat CBC in AM  DVT prophylaxis: SCD's Code Status: DO NOT RESUSCITATE Family Communication: Family at bedside Disposition Plan: Uncertain at this time  Consultants:   Oncology  General surgery  Procedures:     Antimicrobials: Anti-infectives    Start     Dose/Rate Route Frequency Ordered Stop   08/10/16 0930  [MAR Hold]  cefoTEtan (CEFOTAN) 2 g in dextrose 5 % 50 mL IVPB     (MAR Hold since 08/10/16 1605)   2 g 100 mL/hr over 30 Minutes Intravenous On call to O.R. 08/10/16 0856 08/10/16 1600      Subjective: Eager for surgery today  Objective: Vitals:   08/10/16 0646 08/10/16 1018 08/10/16 1346 08/10/16 1408  BP: (!) 176/82 (!) 149/77 120/80   Pulse: 91 75 80 90  Resp: 16 16 16 17   Temp: 98.9 F (37.2 C) 98 F (36.7 C)  98.1 F (36.7 C)  TempSrc: Oral Oral  Oral  SpO2: 94% 94%  96%  Weight:      Height:        Intake/Output Summary (Last 24 hours) at 08/10/16  [place] filed at 08/10/16 [id number]  Gross per 24 hour  Intake             3920 ml  Output             3700 ml  Net              220 ml   Filed Weights   08/07/16 1634  Weight: 127 kg (280 lb)    Examination:  General exam: Lying in bed, no acute distress Respiratory system: Normal chest rise, no audible wheezing Cardiovascular system:  Regular rate, S1-S2 auscultated Gastrointestinal system: Decreased bowel sounds, mild distention Central nervous system: CN II through XII grossly intact, sensation intact Extremities: Perfused, no clubbing Skin: Normal skin turgor, no notable skin lesions seen Psychiatry: Mood normal, no visual hallucinations  Data Reviewed: I have personally reviewed following labs and imaging studies  CBC:  Recent Labs Lab 08/07/16 1800 08/08/16 0429 08/09/16 0625 08/10/16 0410  WBC 14.2* 11.4* 6.6 6.8  NEUTROABS 11.4*  --   --   --   HGB 14.2 13.2 11.8* 11.0*  HCT 42.1 40.6 36.3* 35.3*  MCV 87.7 89.4 88.8 91.7  PLT 290 255 217 [id number]   Basic Metabolic Panel:  Recent Labs Lab 08/07/16 1800 08/08/16 0429 08/09/16 0625 08/09/16 1827  NA 139 144 145 147*  K 3.9 4.0 3.6 3.6  CL 106 111 110 112*  CO2 23 25 27 30   GLUCOSE 110* 113* 96 92  BUN 12 14 13 11   CREATININE 1.01 0.94 1.01 0.93  CALCIUM 9.4 8.9 8.4* 8.4*   GFR: Estimated Creatinine Clearance: 120.4 mL/min (by C-G formula based on SCr of 0.93 mg/dL). Liver Function Tests:  Recent Labs Lab 08/07/16 1800 08/09/16 1827  AST 23 23  ALT 18 18  ALKPHOS 74 58  BILITOT 1.3* 0.9  PROT 8.0 6.8  ALBUMIN 4.2 3.6   No results for input(s): LIPASE, AMYLASE in the last 168 hours. No results for input(s): AMMONIA in the last 168 hours. Coagulation Profile:  Recent Labs Lab 08/10/16 0410  INR 1.24   Cardiac Enzymes: No results for input(s): CKTOTAL, CKMB, CKMBINDEX, TROPONINI in the last 168 hours. BNP (last 3 results) No results for input(s): PROBNP in the last 8760 hours. HbA1C: No results for input(s): HGBA1C in the last 72 hours. CBG:  Recent Labs Lab 08/07/16 2303  GLUCAP 118*   Lipid Profile: No results for input(s): CHOL, HDL, LDLCALC, TRIG, CHOLHDL, LDLDIRECT in the last 72 hours. Thyroid Function Tests: No results for input(s): TSH, T4TOTAL, FREET4, T3FREE, THYROIDAB in the last 72 hours. Anemia Panel: No  results for input(s): VITAMINB12, FOLATE, FERRITIN, TIBC, IRON, RETICCTPCT in the last 72 hours. Sepsis Labs:  Recent Labs Lab 08/07/16 1809 08/07/16 2030  LATICACIDVEN 2.12* 1.30    Recent Results (from the past 240 hour(s))  Surgical pcr screen     Status: None   Collection Time: 08/10/16  9:10 AM  Result Value Ref Range Status   MRSA, PCR NEGATIVE NEGATIVE Final   Staphylococcus aureus NEGATIVE NEGATIVE Final    Comment:        The Xpert SA Assay (FDA approved for NASAL specimens in patients over 56 years of age), is one component of a comprehensive surveillance program.  Test performance has been validated by [facility] for patients greater than or equal to 18 year old. It is not intended to diagnose infection nor to guide or monitor treatment.      Radiology  Studies: Dg Abd 1 View  Result Date: 08/09/2016 CLINICAL DATA:  Followup small bowel obstruction. EXAM: ABDOMEN - 1 VIEW COMPARISON:  08/08/2016 FINDINGS: Nasogastric tube tip remains in the proximal stomach. Mild decrease in small bowel dilatation is seen with partial passage of oral contrast material. Colon remains nondilated. These findings are consistent with a partial small bowel obstruction. IMPRESSION: Partial small bowel obstruction. Nasogastric tube in proximal stomach. Electronically Signed   By: Earle Gell M.D.   On: 08/09/2016 07:37   Dg Abd 2 Views  Result Date: 08/10/2016 CLINICAL DATA:  History of colon cancer. EXAM: ABDOMEN - 2 VIEW COMPARISON:  08/09/2016.  CT 08/07/2016. FINDINGS: NG tube noted in stable position coils in the upper stomach. Persistent dilated loops of small bowel noted. Small-bowel distention and increased slightly from prior exam. Small bowel diameter measures up to 4.5 cm. There is a paucity of intra colonic gas. No free air. Left nephrolithiasis noted. Pelvic calcifications consistent phleboliths. Surgical clips are noted over the pelvis. No acute bony abnormality . IMPRESSION:  1. NG tube again noted coiled in the stomach. 2. Persistent prominently dilated loops of small bowel with a paucity of colonic air. Findings consistent with persistent small-bowel obstruction. Interim slight progression of small-bowel distention noted with small-bowel diameter measuring up to 4.5 cm. No free air. Electronically Signed   By: Marcello Moores  Register   On: 08/10/2016 08:50    Scheduled Meds: . [MAR Hold] enalaprilat  0.625 mg Intravenous Q6H  . [MAR Hold] fentaNYL  50 mcg Transdermal Q72H  . [MAR Hold] lip balm  1 application Topical BID   Continuous Infusions: . sodium chloride 125 mL/hr at 08/09/16 1328     LOS: 3 days   CHIU, Orpah Melter, MD [facility] Pager [phone]  If 7PM-7AM, please contact night-coverage [email or web address] Password [facility] 08/10/2016, 6:40 PM

## 2016-08-10 NOTE — Anesthesia Preprocedure Evaluation (Addendum)
Anesthesia Evaluation  Patient identified by MRN, date of birth, ID band Patient awake    Reviewed: Allergy & Precautions, H&P , Patient's Chart, lab work & pertinent test results, reviewed documented beta blocker date and time   Airway Mallampati: III  TM Distance: >3 FB Neck ROM: full    Dental no notable dental hx.    Pulmonary  sarcoidosis   Pulmonary exam normal breath sounds clear to auscultation       Cardiovascular hypertension, Pt. on medications  Rhythm:regular Rate:Normal     Neuro/Psych  Neuromuscular disease    GI/Hepatic GERD  ,SBO Metastatic colon cancer   Endo/Other  Morbid obesity  Renal/GU      Musculoskeletal   Abdominal   Peds  Hematology  (+) anemia ,   Anesthesia Other Findings Sarcoid   Neuropathy  MO Besnier-Boeck disease; controlled well since 2014.  RA sat 100%   Extreme obesity  LBP  Iron deficiency anemia due to chronic blood loss  Hypertension   Arthritis    GERD)   Abdominal pain     Reproductive/Obstetrics                           Anesthesia Physical Anesthesia Plan  ASA: III  Anesthesia Plan: General   Post-op Pain Management:    Induction: Intravenous, Rapid sequence and Cricoid pressure planned  Airway Management Planned: Oral ETT  Additional Equipment:   Intra-op Plan:   Post-operative Plan: Extubation in OR  Informed Consent: I have reviewed the patients History and Physical, chart, labs and discussed the procedure including the risks, benefits and alternatives for the proposed anesthesia with the patient or authorized representative who has indicated his/her understanding and acceptance.   Dental Advisory Given and Dental advisory given  Plan Discussed with: CRNA and Surgeon  Anesthesia Plan Comments: (  Discussed general anesthesia, including possible nausea, instrumentation of airway, sore throat,pulmonary aspiration, etc.  I asked if the were any outstanding questions, or  concerns before we proceeded. )        Anesthesia Quick Evaluation

## 2016-08-10 NOTE — Op Note (Signed)
Preoperative Diagnosis: Small bowel obstruction secondary to metastatic colon cancer  Postoprative Diagnosis: Same   Procedure: Procedure(s): EXPLORATORY LAPAROTOMY, small bowel resection, abdominal wall resection, partial omentectomy   Surgeon: Excell Seltzer T   Assistants: Jens Som  Anesthesia:  General endotracheal anesthesia  Indications: Patient is a 55 year old male with stage IV colon cancer, status post partial colectomy and colostomy 2 years ago. He presents with high-grade small bowel obstruction and CT scan shows involvement of the small bowel with a tumor mass along the anterior abdominal wall in the lower abdomen with high-grade obstruction. He has a ventral hernia and another apparent tumor mass within the hernia sac. Otherwise no evidence of bulky disease and staging scans were negative in recent months. With this finding I have recommended proceeding with exploratory laparotomy in an effort to relieve his obstruction. I discussed the situation in detail with the patient and his wife including the nature of surgery, possible outcomes and risks detailed elsewhere and they understand and agree to proceed.  Procedure Detail:  Patient was brought to the operating room, placed in supine position on the operating table, and general endotracheal anesthesia induced. He received preoperative IV antibiotics. PAS were in place. The abdomen was widely sterilely prepped and draped and the colostomy isolated with an Ioban drape. Patient timeout was performed and correct procedure verified. The previous midline incision was used and dissection was carried down through the subcutaneous tissue. There was a moderate sized incisional hernia particularly large in the upper portion of the incision and the hernia sac was entered. There was omentum within the hernia sac and also a palpable tumor mass in the omentum adherent to the hernia sac. The omentum and mass were taken down off the sac and  reduced. There were actually very minimal intra-abdominal adhesions and the fascia was opened inferiorly into another hernia defect near the umbilicus and then opened further inferiorly. There were some adhesions of small bowel over toward the colostomy in the left mid abdomen that were taken down. Proximal small bowel was markedly dilated. I was then able to expose the anterior abdominal wall in the left lower abdomen where there was a approximately 6 cm mass in the preperitoneal space involving the small bowel with high-grade obstruction and dilated bowel proximally and decompressed bowel distally.  This mass was exposed I was able to get my hand around to the lateral aspect of the mass and bring the bowel medially and then using cautery the mass was grossly resected from the abdominal wall. I took a portion of the rectus muscle but did not go through to the anterior fascia. The inferior epigastric vessels were clipped and ligated and controlled. The mass was then grossly completely resected out of the abdominal wall and was able to be brought into the midline incision with free bowel proximally and distally. We then ran the bowel proximally to the ligament of Treitz and there were a few filmy adhesions but no evidence of tumor or obstruction. Distally it was about 20-30 cm to the ileocecal valve with markedly decompressed terminal ileum and normal cecum. Exploring the remainder of the peritoneal cavity with again minimal adhesions I was unable to feel any other evidence of tumor except in the hernia sac superiorly as previously described. Points of proximal and distal resection of the small bowel were chosen and divided with the GIA 75 mm stapler. The mesentery was transected with the Enseal. A couple bleeding points were oversewn with 3-0 silk. A functional end-to-end anastomosis  was then created with a single firing of the GIA 75 mm stapler. No bleeding from the staple line. The common enterotomy was closed  with a single firing of the TA 60 stapler. The anastomosis appeared widely patent and was airtight. The crotch of the anastomosis was reinforced with 3-0 silk and the mesenteric defect was closed with interrupted 30 silks. I then resected a portion of the omentum with contained a smaller mass that had been adherent up in the hernia sac and the area of the hernia sac containing the mass was also resected and these were sent as separate specimens.  This was done with the Enseal. The abdomen was then irrigated and we inspected the area of resection in the left lower abdomen and hemostasis was assured. Viscera were returned to their anatomic position. In order to close the fascia at the hernia defect superiorly skin and subcutaneous flaps were raised out laterally to allow fascial closure. The fascia was then closed in the midline with running looped #1 PDS with occasional interrupted #1 Novafil. There was slight tension but this came together fairly well. I resected some thin scar tissue at the skin edges and the remainder of the hernia sac from the subcutaneous tissues tissue. A 19 Blake drain was left in the wound at the hernia repair site and the skin was closed with staples. Sponge needle and instrument counts were correct.    Findings: As above  Estimated Blood Loss:  200 mL         Drains: 19 Blake in subcutaneous changes tissue  Blood Given: none          Specimens: #1 portion of small intestine with abdominal wall tumor mass    #2 second abdominal wall tumor mass   #3 portion of omentum        Complications:  * No complications entered in OR log *         Disposition: PACU - hemodynamically stable.         Condition: stable

## 2016-08-10 NOTE — Progress Notes (Signed)
  Subjective: No change, he is still feeling terrible, nothing from the ostomy bag.  Objective: Vital signs in last 24 hours: Temp:  [97.4 F (36.3 C)-99 F (37.2 C)] 98.9 F (37.2 C) (11/28 0646) Pulse Rate:  [63-91] 91 (11/28 0646) Resp:  [16] 16 (11/28 0646) BP: (145-176)/(76-82) 176/82 (11/28 0646) SpO2:  [93 %-95 %] 94 % (11/28 0646) Last BM Date: 08/10/16 NPO 2300 IV Urine 1250 NG 2100 Afebrile, BP up some Labs OK Intake/Output from previous day: 11/27 0701 - 11/28 0700 In: 2433.3 [I.V.:2313.3; NG/GT:120] Out: 3350 [Urine:1250; Emesis/NG output:2100] Intake/Output this shift: No intake/output data recorded.  General appearance: alert, cooperative and remains distened and uncomfortable with NG. Resp: clear to auscultation bilaterally GI: more distended this AM, no BS, nothing in the ostomy bag.  Lab Results:   Recent Labs  08/09/16 0625 08/10/16 0410  WBC 6.6 6.8  HGB 11.8* 11.0*  HCT 36.3* 35.3*  PLT 217 222    BMET  Recent Labs  08/09/16 0625 08/09/16 1827  NA 145 147*  K 3.6 3.6  CL 110 112*  CO2 27 30  GLUCOSE 96 92  BUN 13 11  CREATININE 1.01 0.93  CALCIUM 8.4* 8.4*   PT/INR  Recent Labs  08/10/16 0410  LABPROT 15.7*  INR 1.24     Recent Labs Lab 08/07/16 1800 08/09/16 1827  AST 23 23  ALT 18 18  ALKPHOS 74 58  BILITOT 1.3* 0.9  PROT 8.0 6.8  ALBUMIN 4.2 3.6     Lipase     Component Value Date/Time   LIPASE 16 03/22/2016 2201     Studies/Results: Dg Abd 1 View  Result Date: 08/09/2016 CLINICAL DATA:  Followup small bowel obstruction. EXAM: ABDOMEN - 1 VIEW COMPARISON:  08/08/2016 FINDINGS: Nasogastric tube tip remains in the proximal stomach. Mild decrease in small bowel dilatation is seen with partial passage of oral contrast material. Colon remains nondilated. These findings are consistent with a partial small bowel obstruction. IMPRESSION: Partial small bowel obstruction. Nasogastric tube in proximal stomach.  Electronically Signed   By: Earle Gell M.D.   On: 08/09/2016 07:37   Dg Abd Portable 1v-small Bowel Obstruction Protocol-initial, 8 Hr Delay  Result Date: 08/08/2016 CLINICAL DATA:  Small bowel obstruction EXAM: PORTABLE ABDOMEN - 1 VIEW COMPARISON:  CT scan 08/07/2016 FINDINGS: Contrast material noted within distended small bowel loops consistent with small bowel obstruction. Paucity of bowel gas within colon. Contrast material from recent CT scan noted within urinary bladder. IMPRESSION: Contrast material noted within distended small bowel loops consistent with small bowel obstruction. Electronically Signed   By: Lahoma Crocker M.D.   On: 08/08/2016 10:04    Medications: . enalaprilat  0.625 mg Intravenous Q6H  . fentaNYL  50 mcg Transdermal Q72H  . lip balm  1 application Topical BID   . 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

## 2016-08-11 ENCOUNTER — Encounter (HOSPITAL_COMMUNITY): Payer: Self-pay | Admitting: General Surgery

## 2016-08-11 DIAGNOSIS — D860 Sarcoidosis of lung: Secondary | ICD-10-CM

## 2016-08-11 LAB — COMPREHENSIVE METABOLIC PANEL
ALK PHOS: 52 U/L (ref 38–126)
ALT: 17 U/L (ref 17–63)
ANION GAP: 10 (ref 5–15)
AST: 24 U/L (ref 15–41)
Albumin: 3.6 g/dL (ref 3.5–5.0)
BILIRUBIN TOTAL: 1.2 mg/dL (ref 0.3–1.2)
BUN: 13 mg/dL (ref 6–20)
CALCIUM: 8 mg/dL — AB (ref 8.9–10.3)
CO2: 23 mmol/L (ref 22–32)
CREATININE: 0.98 mg/dL (ref 0.61–1.24)
Chloride: 112 mmol/L — ABNORMAL HIGH (ref 101–111)
Glucose, Bld: 128 mg/dL — ABNORMAL HIGH (ref 65–99)
Potassium: 3.7 mmol/L (ref 3.5–5.1)
SODIUM: 145 mmol/L (ref 135–145)
TOTAL PROTEIN: 6.1 g/dL — AB (ref 6.5–8.1)

## 2016-08-11 LAB — CBC
HCT: 33 % — ABNORMAL LOW (ref 39.0–52.0)
Hemoglobin: 10.7 g/dL — ABNORMAL LOW (ref 13.0–17.0)
MCH: 28.5 pg (ref 26.0–34.0)
MCHC: 32.4 g/dL (ref 30.0–36.0)
MCV: 88 fL (ref 78.0–100.0)
PLATELETS: 204 10*3/uL (ref 150–400)
RBC: 3.75 MIL/uL — AB (ref 4.22–5.81)
RDW: 13.7 % (ref 11.5–15.5)
WBC: 11.1 10*3/uL — ABNORMAL HIGH (ref 4.0–10.5)

## 2016-08-11 MED ORDER — ENOXAPARIN SODIUM 40 MG/0.4ML ~~LOC~~ SOLN
40.0000 mg | SUBCUTANEOUS | Status: DC
Start: 2016-08-11 — End: 2016-08-18
  Administered 2016-08-11 – 2016-08-18 (×8): 40 mg via SUBCUTANEOUS
  Filled 2016-08-11 (×8): qty 0.4

## 2016-08-11 MED ORDER — ACETAMINOPHEN 10 MG/ML IV SOLN
1000.0000 mg | Freq: Four times a day (QID) | INTRAVENOUS | Status: AC
  Administered 2016-08-11 – 2016-08-12 (×4): 1000 mg via INTRAVENOUS
  Filled 2016-08-11 (×4): qty 100

## 2016-08-11 NOTE — Progress Notes (Addendum)
PROGRESS NOTE    [name]  [id number] DOB: 08-08-1961 DOA: 08/07/2016 PCP: [name], MD    Brief Narrative:  55 y.o. gentleman with a history of Stage IVB metastatic colon cancer S/P colon resection with LUQ ostomy, sarcoidosis, HTN, significant neuropathy, and chronic pain who presents to the ED for evaluation of progressive abdominal pain associated with nausea and vomiting of feculent appearing emesis.  He reports he has really had intermittent abdominal pain for months, but is has been progressively worse over the past several days.  He is now unable to tolerate PO (last meal was yesterday).  He has not had documented fever, but he has had chills and sweats.  Ostomy output is down.  He has not had hematemesis.  No chest pain or shortness of breath.  General surgery was consulted. fter evaluation by oncology, patient and family had decided upon surgical intervention. Patient with surgery on 08/10/2016; waiting for bowels to wake up now.  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 at [place] reviewed. Patient has failed multiple chemotherapy regimens in the past. -Patient is status post colostomy -Appreciate input by [name]. Multiple options were offered to patient,  patient ultimately decided on surgical management for above SBO -Oncology recommends pushing PET/CT about 3-4 weeks after surgery with close follow up with [name] -Recommendations for Palliative Care for further goals of care  Hypertension -Vital signs reviewed -Blood pressure remains stable, slightly suboptimal-suspect increased blood pressure secondary to pain -cont with current bp meds -continue pain meds and follow vital signs  Pulmonary sarcoidosis -Remains on min O2 support -presently stable -continue pulmonary toiletry   Insomnia -Continue with qhs PRN benadryl  Leukocytosis, likely reactive --Labs were reviewed. -will follow trend - no source or signs for infection appreciated -patient afebrile   Morbid obesity -Body mass index is 39.05 kg/m. -low calorie diet and healthy eating habits discussed with patient.  DVT prophylaxis: SCD's Code Status: DO NOT RESUSCITATE Family Communication: Wifebedside Disposition Plan: Uncertain at this time, will follow CCS rec's; hopefully able to return home with [facility] services.  Consultants:   Oncology  General surgery  Procedures:   Exploratory laparotomy (with colostomy bag and partial omentectomy and small bowel resection)  Antimicrobials: Anti-infectives    Start     Dose/Rate Route Frequency Ordered Stop   08/10/16 0930  cefoTEtan (CEFOTAN) 2 g in dextrose 5 % 50 mL IVPB     2 g 100 mL/hr over 30 Minutes Intravenous On call to O.R. 08/10/16 0856 08/10/16 1600      Subjective: Afebrile, no CP, no SOB. Some pain in abdomen around incision. No BM, no gas.  Objective: Vitals:   08/11/16 1000 08/11/16 1400 08/11/16 1758 08/11/16 2147  BP: 133/75 110/70 (!) 158/86 (!) 139/94  Pulse: 77 79 64 67  Resp:  16 18 18 18   Temp: 97.9 F (36.6 C) 97.9 F (36.6 C) 98.4 F (36.9 C) 98.5 F (36.9 C)  TempSrc: Oral Oral Oral Oral  SpO2: 97% 93% 99% 98%  Weight:      Height:        Intake/Output Summary (Last 24  hours) at 08/11/16 2230 Last data filed at 08/11/16 1800  Gross per 24 hour  Intake             1160 ml  Output             1396 ml  Net             -236 ml   Filed Weights   08/07/16 1634  Weight: 127 kg (280 lb)    Examination:  General exam: Lying in bed, afebrile, complaining of pain in his abdomen (around incision). NGT with minimal output. Colostomy bag w/o air or stool material. Respiratory system: Normal chest rise, no audible wheezing Cardiovascular system: Regular rate, S1-S2 auscultated Gastrointestinal system: Decreased bowel sounds, mild distention, incisions w/o drainage. Central nervous system: CN II through XII grossly intact, sensation intact Extremities: Perfused, no clubbing Skin: Normal skin turgor, no notable skin lesions seen Psychiatry: Mood normal, no visual hallucinations  Data Reviewed: I have personally reviewed following labs and imaging studies  CBC:  Recent Labs Lab 08/07/16 1800 08/08/16 0429 08/09/16 0625 08/10/16 0410 08/11/16 0643  WBC 14.2* 11.4* 6.6 6.8 11.1*  NEUTROABS 11.4*  --   --   --   --   HGB 14.2 13.2 11.8* 11.0* 10.7*  HCT 42.1 40.6 36.3* 35.3* 33.0*  MCV 87.7 89.4 88.8 91.7 88.0  PLT 290 255 217 222 0000000   Basic Metabolic Panel:  Recent Labs Lab 08/07/16 1800 08/08/16 0429 08/09/16 0625 08/09/16 1827 08/11/16 0643  NA 139 144 145 147* 145  K 3.9 4.0 3.6 3.6 3.7  CL 106 111 110 112* 112*  CO2 23 25 27 30 23   GLUCOSE 110* 113* 96 92 128*  BUN 12 14 13 11 13   CREATININE 1.01 0.94 1.01 0.93 0.98  CALCIUM 9.4 8.9 8.4* 8.4* 8.0*   GFR: Estimated Creatinine Clearance: 114.3 mL/min (by C-G formula based on SCr of 0.98 mg/dL).   Liver Function Tests:  Recent Labs Lab 08/07/16 1800 08/09/16 1827 08/11/16 0643  AST 23 23 24   ALT 18 18 17   ALKPHOS 74 58 52  BILITOT 1.3* 0.9 1.2  PROT 8.0 6.8 6.1*  ALBUMIN 4.2 3.6 3.6   Coagulation Profile:  Recent Labs Lab 08/10/16 0410  INR 1.24   CBG:  Recent  Labs Lab 08/07/16 2303  GLUCAP 118*   Sepsis Labs:  Recent Labs Lab 08/07/16 1809 08/07/16 2030  LATICACIDVEN 2.12* 1.30    Recent Results (from the past 240 hour(s))  Surgical pcr screen     Status: None   Collection Time: 08/10/16  9:10 AM  Result Value Ref Range Status   MRSA, PCR NEGATIVE NEGATIVE Final   Staphylococcus aureus NEGATIVE NEGATIVE Final    Comment:        The Xpert SA Assay (FDA approved for NASAL specimens in patients over 61 years of age), is one component of a comprehensive surveillance program.  Test performance has been validated by [facility] for patients greater than or equal to 72 year old. It is not intended to diagnose infection nor to guide or monitor treatment.      Radiology Studies: Dg Abd 2 Views  Result  Date: 08/10/2016 CLINICAL DATA:  History of colon cancer. EXAM: ABDOMEN - 2 VIEW COMPARISON:  08/09/2016.  CT 08/07/2016. FINDINGS: NG tube noted in stable position coils in the upper stomach. Persistent dilated loops of small bowel noted. Small-bowel distention and increased slightly from prior exam. Small bowel diameter measures up to 4.5 cm. There is a paucity of intra colonic gas. No free air. Left nephrolithiasis noted. Pelvic calcifications consistent phleboliths. Surgical clips are noted over the pelvis. No acute bony abnormality . IMPRESSION: 1. NG tube again noted coiled in the stomach. 2. Persistent prominently dilated loops of small bowel with a paucity of colonic air. Findings consistent with persistent small-bowel obstruction. Interim slight progression of small-bowel distention noted with small-bowel diameter measuring up to 4.5 cm. No free air. Electronically Signed   By: Marcello Moores  Register   On: 08/10/2016 08:50    Scheduled Meds: . acetaminophen  1,000 mg Intravenous Q6H  . enalaprilat  0.625 mg Intravenous Q6H  . enoxaparin (LOVENOX) injection  40 mg Subcutaneous Q24H  . fentaNYL  50 mcg Transdermal Q72H  . lip balm  1  application Topical BID   Continuous Infusions: . sodium chloride 125 mL/hr at 08/11/16 1931     LOS: 4 days   Barton Dubois, MD [facility] Pager [phone]  If 7PM-7AM, please contact night-coverage [email or web address] Password [email or web address] 08/11/2016, 10:30 PM

## 2016-08-11 NOTE — Telephone Encounter (Signed)
Medication refill

## 2016-08-11 NOTE — Progress Notes (Signed)
1 Day Post-Op  Subjective: Some incisional pain that is temporarily controlled with meds. Generally doing pretty well.  Objective: Vital signs in last 24 hours: Temp:  [98 F (36.7 C)-98.9 F (37.2 C)] 98.2 F (36.8 C) (11/29 0453) Pulse Rate:  [63-98] 63 (11/29 0453) Resp:  [8-20] 18 (11/29 0453) BP: (120-174)/(61-134) 152/61 (11/29 0453) SpO2:  [94 %-100 %] 97 % (11/29 0453) Last BM Date: 08/10/16  Intake/Output from previous day: 11/28 0701 - 11/29 0700 In: 3145 [P.O.:120; I.V.:3025] Out: 2535 [Urine:1175; Emesis/NG output:1190; Drains:70; Blood:100] Intake/Output this shift: No intake/output data recorded.  General appearance: alert, cooperative and no distress Resp: No wheezing or increased work of breathing GI: Appropriate incisional tenderness. Incision/Wound: Dressing clean and dry  Lab Results:   Recent Labs  08/10/16 0410 08/11/16 0643  WBC 6.8 11.1*  HGB 11.0* 10.7*  HCT 35.3* 33.0*  PLT 222 204   BMET  Recent Labs  08/09/16 1827 08/11/16 0643  NA 147* 145  K 3.6 3.7  CL 112* 112*  CO2 30 23  GLUCOSE 92 128*  BUN 11 13  CREATININE 0.93 0.98  CALCIUM 8.4* 8.0*     Studies/Results: Dg Abd 2 Views  Result Date: 08/10/2016 CLINICAL DATA:  History of colon cancer. EXAM: ABDOMEN - 2 VIEW COMPARISON:  08/09/2016.  CT 08/07/2016. FINDINGS: NG tube noted in stable position coils in the upper stomach. Persistent dilated loops of small bowel noted. Small-bowel distention and increased slightly from prior exam. Small bowel diameter measures up to 4.5 cm. There is a paucity of intra colonic gas. No free air. Left nephrolithiasis noted. Pelvic calcifications consistent phleboliths. Surgical clips are noted over the pelvis. No acute bony abnormality . IMPRESSION: 1. NG tube again noted coiled in the stomach. 2. Persistent prominently dilated loops of small bowel with a paucity of colonic air. Findings consistent with persistent small-bowel obstruction. Interim  slight progression of small-bowel distention noted with small-bowel diameter measuring up to 4.5 cm. No free air. Electronically Signed   By: Marcello Moores  Register   On: 08/10/2016 08:50    Anti-infectives: Anti-infectives    Start     Dose/Rate Route Frequency Ordered Stop   08/10/16 0930  cefoTEtan (CEFOTAN) 2 g in dextrose 5 % 50 mL IVPB     2 g 100 mL/hr over 30 Minutes Intravenous On call to O.R. 08/10/16 0856 08/10/16 1600      Assessment/Plan: s/p Procedure(s): EXPLORATORY LAPAROTOMY small bowel resection abdominal wall resection partial omentectomy Doing well postoperatively. Ambulation and pulmonary toiletry encouraged. Continue NG, nothing by mouth. Discontinue Foley   LOS: 4 days    Mahira Petras T 11/29/2017[name], male   DOB: 21-Oct-1960, 55 y.o.   MRN: [id number]

## 2016-08-11 NOTE — Anesthesia Postprocedure Evaluation (Signed)
Anesthesia Post Note  Patient: [name]  Procedure(s) Performed: Procedure(s) (LRB): EXPLORATORY LAPAROTOMY small bowel resection abdominal wall resection partial omentectomy (N/A)  Patient location during evaluation: PACU Anesthesia Type: General Level of consciousness: sedated Pain management: satisfactory to patient Vital Signs Assessment: post-procedure vital signs reviewed and stable Respiratory status: spontaneous breathing Cardiovascular status: stable Anesthetic complications: no Comments: Needs OSA orders    Last Vitals:  Vitals:   08/11/16 0044 08/11/16 0453  BP: 140/81 (!) 152/61  Pulse: 81 63  Resp: 16 18  Temp: 37.2 C 36.8 C    Last Pain:  Vitals:   08/11/16 0522  TempSrc:   PainSc: 2                  Heide Brossart EDWARD

## 2016-08-12 ENCOUNTER — Other Ambulatory Visit: Payer: BLUE CROSS/BLUE SHIELD

## 2016-08-12 ENCOUNTER — Ambulatory Visit: Payer: BLUE CROSS/BLUE SHIELD | Admitting: Internal Medicine

## 2016-08-12 LAB — CBC
HEMATOCRIT: 32.7 % — AB (ref 39.0–52.0)
Hemoglobin: 10.4 g/dL — ABNORMAL LOW (ref 13.0–17.0)
MCH: 29.1 pg (ref 26.0–34.0)
MCHC: 31.8 g/dL (ref 30.0–36.0)
MCV: 91.3 fL (ref 78.0–100.0)
Platelets: 247 10*3/uL (ref 150–400)
RBC: 3.58 MIL/uL — ABNORMAL LOW (ref 4.22–5.81)
RDW: 14.3 % (ref 11.5–15.5)
WBC: 13 10*3/uL — AB (ref 4.0–10.5)

## 2016-08-12 MED ORDER — HYDROMORPHONE HCL 1 MG/ML IJ SOLN: 0.5000 mg | INTRAMUSCULAR | Status: DC | PRN

## 2016-08-12 MED ORDER — ACETAMINOPHEN 10 MG/ML IV SOLN: 1000.0000 mg | Freq: Four times a day (QID) | INTRAVENOUS | Status: DC

## 2016-08-12 MED ORDER — KETOROLAC TROMETHAMINE 15 MG/ML IJ SOLN
30.0000 mg | Freq: Four times a day (QID) | INTRAMUSCULAR | Status: AC | PRN
  Administered 2016-08-12 – 2016-08-14 (×5): 30 mg via INTRAVENOUS
  Filled 2016-08-12 (×5): qty 2

## 2016-08-12 MED ORDER — FENTANYL 75 MCG/HR TD PT72
75.0000 ug | MEDICATED_PATCH | TRANSDERMAL | Status: DC
  Administered 2016-08-12 – 2016-08-14 (×2): 75 ug via TRANSDERMAL
  Filled 2016-08-12 (×2): qty 1

## 2016-08-12 MED ORDER — HYDROMORPHONE HCL 4 MG/ML IJ SOLN
0.5000 mg | INTRAMUSCULAR | Status: DC | PRN
  Administered 2016-08-12 – 2016-08-15 (×34): 2 mg via INTRAVENOUS
  Filled 2016-08-12 (×34): qty 1

## 2016-08-12 MED ORDER — LORAZEPAM 2 MG/ML IJ SOLN
1.0000 mg | Freq: Once | INTRAMUSCULAR | Status: AC
  Administered 2016-08-12: 1 mg via INTRAVENOUS
  Filled 2016-08-12: qty 1

## 2016-08-12 NOTE — Progress Notes (Signed)
PROGRESS NOTE    [name]  [id number] DOB: 1960-10-11 DOA: 08/07/2016 PCP: [name], MD    Brief Narrative:  55 y.o. gentleman with a history of Stage IVB metastatic colon cancer S/P colon resection with LUQ ostomy, sarcoidosis, HTN, significant neuropathy, and chronic pain who presents to the ED for evaluation of progressive abdominal pain associated with nausea and vomiting of feculent appearing emesis.  He reports he has really had intermittent abdominal pain for months, but is has been progressively worse over the past several days.  He is now unable to tolerate PO (last meal was yesterday).  He has not had documented fever, but he has had chills and sweats.  Ostomy output is down.  He has not had hematemesis.  No chest pain or shortness of breath.  General surgery was consulted. fter evaluation by oncology, patient and family had decided upon surgical intervention. Patient with surgery on 08/10/2016; waiting for bowels to wake up now.  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 at [name] reviewed. Patient has failed multiple chemotherapy regimens in the past. -Patient is status post colostomy -Appreciate input by [name].  Multiple options were offered to patient, patient ultimately decided on surgical management for above SBO -Oncology recommends pushing PET/CT about 3-4 weeks after surgery with close follow up with [name]  Hypertension -Vital signs reviewed -Blood pressure remains stable, slightly suboptimal-suspect increased blood pressure secondary to pain -cont with current bp meds -continue pain meds and follow vital signs  Pulmonary sarcoidosis -Remains on min O2 support -presently stable -continue pulmonary toiletry   Insomnia -Continue with qhs PRN benadryl  Leukocytosis, likely reactive --Labs were reviewed. -will follow trend - no source or signs for infection appreciated -patient afebrile   Morbid obesity -Body mass index is 39.05 kg/m. -low calorie diet and healthy eating habits discussed with patient.  Anxiety -will continue PRN ativan  DVT prophylaxis: SCD's Code Status: DO NOT RESUSCITATE Family Communication: Wifebedside Disposition Plan: Uncertain at this time, will follow CCS rec's; hopefully able to return home with [facility] services.  Consultants:   Oncology  General surgery  Procedures:   Exploratory laparotomy (with colostomy bag and partial omentectomy and small bowel resection)  Antimicrobials: Anti-infectives    Start     Dose/Rate Route Frequency Ordered Stop   08/10/16 0930  cefoTEtan (CEFOTAN) 2 g in dextrose 5 % 50 mL IVPB     2 g 100 mL/hr over 30 Minutes Intravenous On call to O.R. 08/10/16 0856 08/10/16 1600      Subjective: Afebrile, no CP, no SOB. Patient anxious and with severe pain in his abdomen. No BM yet; some gas seen on colostomy bag.   Objective: Vitals:   08/12/16 0635 08/12/16 1024 08/12/16 1148 08/12/16 1435  BP: 129/74  134/78 (!) 129/58 138/68  Pulse:  84 70 71  Resp:  18  18  Temp:  98.5 F (36.9 C)  98.2 F (36.8 C)  TempSrc:  Oral  Oral  SpO2:  92% 95% 94%  Weight:      Height:        Intake/Output Summary  (Last 24 hours) at 08/12/16 1628 Last data filed at 08/12/16 1435  Gross per 24 hour  Intake             2250 ml  Output             2330 ml  Net              -80 ml   Filed Weights   08/07/16 1634  Weight: 127 kg (280 lb)    Examination:  General exam: Lying in bed, afebrile, complaining of severe pain in his abdomen (around incision). NGT with minimal output and per CCS on clamping trial now. Colostomy bag with gas, but not stool material. Respiratory system: Normal chest rise, no audible wheezing Cardiovascular system: Regular rate, S1-S2 auscultated Gastrointestinal system: Decreased bowel sounds, mild distention, incisions w/o drainage. Central nervous system: CN II through XII grossly intact, sensation intact Extremities: Perfused, no clubbing Skin: Normal skin turgor, no notable skin lesions seen Psychiatry: Mood normal, no visual hallucinations  Data Reviewed: I have personally reviewed following labs and imaging studies  CBC:  Recent Labs Lab 08/07/16 1800 08/08/16 0429 08/09/16 0625 08/10/16 0410 08/11/16 0643 08/12/16 0659  WBC 14.2* 11.4* 6.6 6.8 11.1* 13.0*  NEUTROABS 11.4*  --   --   --   --   --   HGB 14.2 13.2 11.8* 11.0* 10.7* 10.4*  HCT 42.1 40.6 36.3* 35.3* 33.0* 32.7*  MCV 87.7 89.4 88.8 91.7 88.0 91.3  PLT 290 255 217 222 204 [id number]   Basic Metabolic Panel:  Recent Labs Lab 08/07/16 1800 08/08/16 0429 08/09/16 0625 08/09/16 1827 08/11/16 0643  NA 139 144 145 147* 145  K 3.9 4.0 3.6 3.6 3.7  CL 106 111 110 112* 112*  CO2 23 25 27 30 23   GLUCOSE 110* 113* 96 92 128*  BUN 12 14 13 11 13   CREATININE 1.01 0.94 1.01 0.93 0.98  CALCIUM 9.4 8.9 8.4* 8.4* 8.0*   GFR: Estimated Creatinine Clearance: 114.3 mL/min (by C-G formula based on SCr of 0.98 mg/dL).   Liver Function Tests:  Recent Labs Lab 08/07/16 1800 08/09/16 1827 08/11/16 0643  AST 23 23 24   ALT 18 18 17   ALKPHOS 74 58 52  BILITOT 1.3* 0.9 1.2  PROT 8.0 6.8 6.1*  ALBUMIN  4.2 3.6 3.6   Coagulation Profile:  Recent Labs Lab 08/10/16 0410  INR 1.24   CBG:  Recent Labs Lab 08/07/16 2303  GLUCAP 118*   Sepsis Labs:  Recent Labs Lab 08/07/16 1809 08/07/16 2030  LATICACIDVEN 2.12* 1.30    Recent Results (from the past 240 hour(s))  Surgical pcr screen     Status: None   Collection Time: 08/10/16  9:10 AM  Result Value Ref Range Status   MRSA, PCR NEGATIVE NEGATIVE Final   Staphylococcus aureus NEGATIVE NEGATIVE Final    Comment:        The Xpert SA Assay (FDA approved for NASAL specimens in patients over 36 years of age), is one component of a comprehensive surveillance program.  Test performance has been validated by [facility] for patients greater than or equal to 47 year old.  It is not intended to diagnose infection nor to guide or monitor treatment.      Radiology Studies: No results found.  Scheduled Meds: . enalaprilat  0.625 mg Intravenous Q6H  . enoxaparin (LOVENOX) injection  40 mg Subcutaneous Q24H  . fentaNYL  75 mcg Transdermal Q48H  . lip balm  1 application Topical BID  . LORazepam  1 mg Intravenous Once   Continuous Infusions: . sodium chloride 125 mL/hr at 08/12/16 0600     LOS: 5 days   Barton Dubois, MD [facility] Pager [phone]  If 7PM-7AM, please contact night-coverage [email or web address] Password [email or web address] 08/12/2016, 4:28 PM

## 2016-08-12 NOTE — Progress Notes (Signed)
Patients NG tube accidentally came out,on call Provider was notified Dr. Zella Richer ordered to keep the NG tube out for tonight. We will continue to monitor and assess the patient.

## 2016-08-12 NOTE — Progress Notes (Signed)
2 Days Post-Op  Subjective: Pt is having pain issues, the dilaudid works but just last about 1.5 hours.  He was on chronic pain meds for pain before surgery.  Otherwise he looks good.  Having some gas in his ostomy bag.  NG drainage looks like ice chips.  He is walking the halls all the time.    Objective: Vital signs in last 24 hours: Temp:  [97.9 F (36.6 C)-98.5 F (36.9 C)] 98.5 F (36.9 C) (11/30 0546) Pulse Rate:  [64-79] 70 (11/30 0546) Resp:  [16-18] 18 (11/30 0546) BP: (110-182)/(70-101) 129/74 (11/30 0635) SpO2:  [93 %-99 %] 94 % (11/30 0546) Last BM Date: 08/10/16 3000 IV 1100 urine NG 950 Drain 86 Afebrile, VSS BP up some. WBC 13K 22 mg Dilaudid yesterday 14 mg Dilaudid so far this AM Fentanyl patch  Intake/Output from previous day: 11/29 0701 - 11/30 0700 In: 3160 [I.V.:3000; IV Piggyback:160] Out: 2136 [Urine:1100; Emesis/NG output:950; Drains:86] Intake/Output this shift: Total I/O In: 250 [I.V.:250] Out: 20 [Drains:20]  General appearance: alert, cooperative and no distress Resp: clear to auscultation bilaterally GI: large abdomen, somewhat distended, BS very hypoactive, some flatus in the ostomy bag.  Midline incision is OK some skin changes in the umbilical area.    Lab Results:   Recent Labs  08/11/16 0643 08/12/16 0659  WBC 11.1* 13.0*  HGB 10.7* 10.4*  HCT 33.0* 32.7*  PLT 204 247    BMET  Recent Labs  08/09/16 1827 08/11/16 0643  NA 147* 145  K 3.6 3.7  CL 112* 112*  CO2 30 23  GLUCOSE 92 128*  BUN 11 13  CREATININE 0.93 0.98  CALCIUM 8.4* 8.0*   PT/INR  Recent Labs  08/10/16 0410  LABPROT 15.7*  INR 1.24     Recent Labs Lab 08/07/16 1800 08/09/16 1827 08/11/16 0643  AST 23 23 24   ALT 18 18 17   ALKPHOS 74 58 52  BILITOT 1.3* 0.9 1.2  PROT 8.0 6.8 6.1*  ALBUMIN 4.2 3.6 3.6     Lipase     Component Value Date/Time   LIPASE 16 03/22/2016 2201     Studies/Results: No results found.    Medications: .  enalaprilat  0.625 mg Intravenous Q6H  . enoxaparin (LOVENOX) injection  40 mg Subcutaneous Q24H  . fentaNYL  50 mcg Transdermal Q72H  . lip balm  1 application Topical BID   . sodium chloride 125 mL/hr at 08/12/16 0600    Assessment/Plan Small bowel obstruction secondary to metastatic colon cancer S/p EXPLORATORY LAPAROTOMY,small bowel resection,abdominal wall resection,partial omentectomy, 08/10/16, Dr. 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  I will up his fentanyl patch this Am.  Try clamping trial with the NG, it is mostly ice chips.  Once we can get him on PO's we can give him oxycodone that will last longer and decrease his IV pain meds. Creatinine is normal so I have continued and increased his Toradol.    LOS: 5 days    Cattie Tineo 08/12/2016 [phone]

## 2016-08-13 LAB — BASIC METABOLIC PANEL
ANION GAP: 8 (ref 5–15)
BUN: 15 mg/dL (ref 6–20)
CHLORIDE: 109 mmol/L (ref 101–111)
CO2: 26 mmol/L (ref 22–32)
Calcium: 7.8 mg/dL — ABNORMAL LOW (ref 8.9–10.3)
Creatinine, Ser: 0.81 mg/dL (ref 0.61–1.24)
GFR calc Af Amer: >60 mL/min (ref >60)
GLUCOSE: 84 mg/dL (ref 65–99)
POTASSIUM: 3.6 mmol/L (ref 3.5–5.1)
Sodium: 143 mmol/L (ref 135–145)

## 2016-08-13 LAB — CBC
HCT: 31 % — ABNORMAL LOW (ref 39.0–52.0)
HEMOGLOBIN: 9.9 g/dL — AB (ref 13.0–17.0)
MCH: 29.1 pg (ref 26.0–34.0)
MCHC: 31.9 g/dL (ref 30.0–36.0)
MCV: 91.2 fL (ref 78.0–100.0)
PLATELETS: 216 10*3/uL (ref 150–400)
RBC: 3.4 MIL/uL — AB (ref 4.22–5.81)
RDW: 14.3 % (ref 11.5–15.5)
WBC: 9.9 10*3/uL (ref 4.0–10.5)

## 2016-08-13 MED ORDER — MORPHINE SULFATE (PF) 2 MG/ML IV SOLN: 2.0000 mg | INTRAVENOUS | Status: DC | PRN

## 2016-08-13 NOTE — Progress Notes (Signed)
Oncology Short Note  Appreciate excellent cares by the nursing staff, Hospital medicine and surgical teams. Patient to have oncology follow-up with Dr. Real Cons at [facility] in 2 weeks after discharge from the hospital.  Sullivan Lone MD MS

## 2016-08-13 NOTE — Progress Notes (Signed)
PROGRESS NOTE    [name] PUENTES  [id number] DOB: 09-02-1961 DOA: 08/07/2016 PCP: [name], MD    Brief Narrative:  55 y.o. gentleman with a history of Stage IVB metastatic colon cancer S/P colon resection with LUQ ostomy, sarcoidosis, HTN, significant neuropathy, and chronic pain who presents to the ED for evaluation of progressive abdominal pain associated with nausea and vomiting of feculent appearing emesis.  He reports he has really had intermittent abdominal pain for months, but is has been progressively worse over the past several days.  He is now unable to tolerate PO (last meal was yesterday).  He has not had documented fever, but he has had chills and sweats.  Ostomy output is down.  He has not had hematemesis.  No chest pain or shortness of breath.  General surgery was consulted. fter evaluation by oncology, patient and family had decided upon surgical intervention. Patient with surgery on 08/10/2016; waiting for bowels to wake up now.  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  -diet advancement as per CCS -Continue analgesics -Continue IVF hydration and alternative route for meds -will follow CCS post operative recommendations  -biggest issue its his pain right now  Colon cancer -Patient followed by [name] at [place] reviewed. Patient has failed multiple chemotherapy regimens in the past. -Patient is status post  colostomy -Appreciate input by [name]. Multiple options were offered to patient, patient ultimately decided on surgical management for above SBO -Oncology recommends pushing PET/CT about 3-4 weeks after surgery with close follow up with [name]  Hypertension -Vital signs reviewed -Blood pressure remains stable, slightly suboptimal-suspect increased blood pressure secondary to pain -cont with current bp meds -continue pain meds and follow vital signs  Pulmonary sarcoidosis -Remains on min O2 support -presently stable -continue pulmonary toiletry   Insomnia -Continue with qhs PRN benadryl  Leukocytosis, likely reactive --Labs were reviewed. -will follow trend - no source or signs for infection appreciated -patient has remained afebrile   Morbid obesity -Body mass index is 39.05 kg/m. -low calorie diet and healthy eating habits discussed with patient.  Anxiety -will continue PRN ativan  DVT prophylaxis: SCD's Code Status: DO NOT RESUSCITATE Family Communication: Wifebedside Disposition Plan: Uncertain at this time, will follow [facility]CS rec's; hopefully able to return home with [facility] services.  Consultants:   Oncology  General surgery  Procedures:   Exploratory laparotomy (with colostomy bag and partial omentectomy and small bowel resection)  Antimicrobials: Anti-infectives    Start     Dose/Rate Route Frequency Ordered Stop   08/10/16 0930  cefoTEtan (CEFOTAN) 2 g in dextrose 5 % 50 mL IVPB     2 g 100 mL/hr over 30 Minutes Intravenous On call to O.R. 08/10/16 0856 08/10/16 1600      Subjective: Afebrile, no CP, no SOB. Patient continue to have pain in his abdomen, no nausea, no vomiting and accidentally has NGT removed overnight.  Objective: Vitals:   08/12/16 1815 08/12/16 2119  08/13/16 0500 08/13/16 1142  BP:  (!) 163/83 (!) 159/83 128/70  Pulse:  75 76 65  Resp:  18 18 20   Temp: 98.4 F (36.9 C) 99 F (37.2 C) 98.9 F (37.2 C)   TempSrc:  Oral  Oral   SpO2:  94% 93% 96%  Weight:      Height:        Intake/Output Summary (Last 24 hours) at 08/13/16 1636 Last data filed at 08/13/16 1350  Gross per 24 hour  Intake             2930 ml  Output             1405 ml  Net             1525 ml   Filed Weights   08/07/16 1634  Weight: 127 kg (280 lb)    Examination:  General exam: Lying in bed, afebrile, NGT removed by accident overnight. No nausea or vomiting. Continue complaining of severe pain in his abdomen (especially around incision). Colostomy bag with gas, but not stool material. Respiratory system: Normal chest rise, no audible wheezing Cardiovascular system: Regular rate, S1-S2 auscultated Gastrointestinal system: Decreased bowel sounds, mild distention, incisions w/o drainage. Central nervous system: CN II through XII grossly intact, sensation intact Extremities: Perfused, no clubbing Skin: Normal skin turgor, no notable skin lesions seen Psychiatry: Mood normal, no visual hallucinations  Data Reviewed: I have personally reviewed following labs and imaging studies  CBC:  Recent Labs Lab 08/07/16 1800  08/09/16 0625 08/10/16 0410 08/11/16 0643 08/12/16 0659 08/13/16 0419  WBC 14.2*  < > 6.6 6.8 11.1* 13.0* 9.9  NEUTROABS 11.4*  --   --   --   --   --   --   HGB 14.2  < > 11.8* 11.0* 10.7* 10.4* 9.9*  HCT 42.1  < > 36.3* 35.3* 33.0* 32.7* 31.0*  MCV 87.7  < > 88.8 91.7 88.0 91.3 91.2  PLT 290  < > 217 222 204 247 216  < > = values in this interval not displayed. Basic Metabolic Panel:  Recent Labs Lab 08/08/16 0429 08/09/16 0625 08/09/16 1827 08/11/16 0643 08/13/16 0419  NA 144 145 147* 145 143  K 4.0 3.6 3.6 3.7 3.6  CL 111 110 112* 112* 109  CO2 25 27 30 23 26   GLUCOSE 113* 96 92 128* 84  BUN 14 13 11 13 15   CREATININE 0.94 1.01 0.93 0.98 0.81  CALCIUM 8.9 8.4* 8.4* 8.0* 7.8*   GFR: Estimated Creatinine Clearance: 138.3 mL/min (by C-G formula based on SCr of 0.81 mg/dL).   Liver Function  Tests:  Recent Labs Lab 08/07/16 1800 08/09/16 1827 08/11/16 0643  AST 23 23 24   ALT 18 18 17   ALKPHOS 74 58 52  BILITOT 1.3* 0.9 1.2  PROT 8.0 6.8 6.1*  ALBUMIN 4.2 3.6 3.6   Coagulation Profile:  Recent Labs Lab 08/10/16 0410  INR 1.24   CBG:  Recent Labs Lab 08/07/16 2303  GLUCAP 118*   Sepsis Labs:  Recent Labs Lab 08/07/16 1809 08/07/16 2030  LATICACIDVEN 2.12* 1.30    Recent Results (from the past 240 hour(s))  Surgical pcr screen     Status: None   Collection Time: 08/10/16  9:10 AM  Result Value Ref Range Status   MRSA, PCR NEGATIVE NEGATIVE Final   Staphylococcus aureus NEGATIVE NEGATIVE Final    Comment:        The Xpert SA Assay (FDA approved  for NASAL specimens in patients over 12 years of age), is one component of a comprehensive surveillance program.  Test performance has been validated by [facility] for patients greater than or equal to 59 year old. It is not intended to diagnose infection nor to guide or monitor treatment.      Radiology Studies: No results found.  Scheduled Meds: . enalaprilat  0.625 mg Intravenous Q6H  . enoxaparin (LOVENOX) injection  40 mg Subcutaneous Q24H  . fentaNYL  75 mcg Transdermal Q48H  . lip balm  1 application Topical BID   Continuous Infusions: . sodium chloride 125 mL/hr at 08/13/16 1115     LOS: 6 days   Barton Dubois, MD [facility] Pager [phone]  If 7PM-7AM, please contact night-coverage [email or web address] Password [facility] 08/13/2016, 4:36 PM

## 2016-08-13 NOTE — Progress Notes (Signed)
3 Days Post-Op  Subjective: Incisional pain remains an issue but he does not appear in any distress. Has been walking the halls frequently. NG came out accidentally. He is not nauseated. No flatus or bowel movements yet.  Objective: Vital signs in last 24 hours: Temp:  [98.2 F (36.8 C)-99 F (37.2 C)] 98.9 F (37.2 C) (12/01 0500) Pulse Rate:  [70-76] 76 (12/01 0500) Resp:  [18] 18 (12/01 0500) BP: (116-163)/(58-86) 159/83 (12/01 0500) SpO2:  [93 %-95 %] 93 % (12/01 0500) Last BM Date: 08/10/16  Intake/Output from previous day: 11/30 0701 - 12/01 0700 In: 3180 [P.O.:60; I.V.:3000; NG/GT:120] Out: 1735 [Urine:1050; Emesis/NG output:550; Drains:135] Intake/Output this shift: Total I/O In: 0  Out: 310 [Urine:300; Drains:10]  General appearance: alert, cooperative and no distress GI: No apparent distention. Obese. Some incisional tenderness. Nothing in his ostomy bag. Slight bloody drainage from the incision. Wound drain clear Incision/Wound: No sign of infection  Lab Results:   Recent Labs  08/12/16 0659 08/13/16 0419  WBC 13.0* 9.9  HGB 10.4* 9.9*  HCT 32.7* 31.0*  PLT 247 216   BMET  Recent Labs  08/11/16 0643 08/13/16 0419  NA 145 143  K 3.7 3.6  CL 112* 109  CO2 23 26  GLUCOSE 128* 84  BUN 13 15  CREATININE 0.98 0.81  CALCIUM 8.0* 7.8*     Studies/Results: No results found.  Anti-infectives: Anti-infectives    Start     Dose/Rate Route Frequency Ordered Stop   08/10/16 0930  cefoTEtan (CEFOTAN) 2 g in dextrose 5 % 50 mL IVPB     2 g 100 mL/hr over 30 Minutes Intravenous On call to O.R. 08/10/16 0856 08/10/16 1600      Assessment/Plan: s/p Procedure(s): EXPLORATORY LAPAROTOMY small bowel resection abdominal wall resection partial omentectomy Seems to be progressing reasonably well. Incisional pain control has been difficult as he has been on some chronic medications. No sign of complication. Continue nothing by mouth for now.   LOS: 6 days     [name] T 12/1/2017[name], male   DOB: May 28, 1961, 55 y.o.   MRN: [id number]

## 2016-08-14 NOTE — Progress Notes (Signed)
General Surgery [facility]  Assessment & Plan:  Ex lap with small bowel resection and resection of metastasis POD#4  NG out yesterday, no nausea or emesis overnight  Begin clear liquids today  Encouraged OOB, ambulation        Earnstine Regal, MD, [facility]       Office: [phone]    Subjective: Patient in bed, no complaints.  Family at bedside.  Objective: Vital signs in last 24 hours: Temp:  [98 F (36.7 C)-99 F (37.2 C)] 98 F (36.7 C) (12/02 0510) Pulse Rate:  [64-65] 64 (12/02 0510) Resp:  [18-20] 18 (12/02 0510) BP: (121-161)/(70-84) 161/84 (12/02 0510) SpO2:  [96 %-98 %] 98 % (12/02 0510) Last BM Date: 08/10/16  Intake/Output from previous day: 12/01 0701 - 12/02 0700 In: 2888.8 [P.O.:120; I.V.:2708.8; IV Piggyback:60] Out: [id number] [Urine:1450; Drains:100; Stool:5] Intake/Output this shift: No intake/output data recorded.  Physical Exam: HEENT - sclerae clear, mucous membranes moist Abdomen - soft, obese; BS present; no output in ostomy bag; midline dressing dry and intact Neuro - alert & oriented, no focal deficits  Lab Results:   Recent Labs  08/12/16 0659 08/13/16 0419  WBC 13.0* 9.9  HGB 10.4* 9.9*  HCT 32.7* 31.0*  PLT 247 216   BMET  Recent Labs  08/13/16 0419  NA 143  K 3.6  CL 109  CO2 26  GLUCOSE 84  BUN 15  CREATININE 0.81  CALCIUM 7.8*   PT/INR No results for input(s): LABPROT, INR in the last 72 hours. Comprehensive Metabolic Panel:    Component Value Date/Time   NA 143 08/13/2016 0419   NA 145 08/11/2016 0643   NA 138 01/02/2015 1555   NA 138 11/20/2012 1410   K 3.6 08/13/2016 0419   K 3.7 08/11/2016 0643   K 3.7 01/02/2015 1555   K 3.2 (L) 11/20/2012 1410   CL 109 08/13/2016 0419   CL 112 (H) 08/11/2016 0643   CL 109 01/02/2015 1555   CL 104 11/20/2012 1410   CO2 26 08/13/2016 0419   CO2 23 08/11/2016 0643   CO2 22 01/02/2015 1555   CO2 27 11/20/2012  1410   BUN 15 08/13/2016 0419   BUN 13 08/11/2016 0643   BUN 9 01/02/2015 1555   BUN 9 11/20/2012 1410   CREATININE 0.81 08/13/2016 0419   CREATININE 0.98 08/11/2016 0643   CREATININE 1.04 01/02/2015 1555   CREATININE 0.91 11/20/2012 1410   GLUCOSE 84 08/13/2016 0419   GLUCOSE 128 (H) 08/11/2016 0643   GLUCOSE 110 (H) 01/02/2015 1555   GLUCOSE 95 11/20/2012 1410   CALCIUM 7.8 (L) 08/13/2016 0419   CALCIUM 8.0 (L) 08/11/2016 0643   CALCIUM 8.8 (L) 01/02/2015 1555   CALCIUM 8.1 (L) 11/20/2012 1410   AST 24 08/11/2016 0643   AST 23 08/09/2016 1827   AST 39 01/02/2015 1555   AST 24 11/20/2012 1410   ALT 17 08/11/2016 0643   ALT 18 08/09/2016 1827   ALT 55 01/02/2015 1555   ALT 38 11/20/2012 1410   ALKPHOS 52 08/11/2016 0643   ALKPHOS 58 08/09/2016 1827   ALKPHOS 125 01/02/2015 1555   ALKPHOS 123 11/20/2012 1410   BILITOT 1.2 08/11/2016 0643   BILITOT 0.9 08/09/2016 1827   BILITOT 0.5 01/02/2015 1555   BILITOT 0.5 11/20/2012 1410   PROT 6.1 (L) 08/11/2016 0643   PROT 6.8 08/09/2016 1827   PROT 8.2 (H) 01/02/2015  1555   PROT 7.7 11/20/2012 1410   ALBUMIN 3.6 08/11/2016 0643   ALBUMIN 3.6 08/09/2016 1827   ALBUMIN 4.0 01/02/2015 1555   ALBUMIN 3.1 (L) 11/20/2012 1410    Studies/Results: No results found.    [name] M 08/14/2016  [name], male   DOB: 1961-04-15, 55 y.o.   MRN: [id number]

## 2016-08-15 MED ORDER — KCL IN DEXTROSE-NACL 20-5-0.45 MEQ/L-%-% IV SOLN
INTRAVENOUS | Status: DC
Start: 2016-08-15 — End: 2016-08-18
  Administered 2016-08-15: 11:00:00 via INTRAVENOUS
  Administered 2016-08-17: 1000 mL via INTRAVENOUS
  Administered 2016-08-17: 03:00:00 via INTRAVENOUS
  Filled 2016-08-15 (×5): qty 1000

## 2016-08-15 MED ORDER — HYDROMORPHONE HCL 2 MG/ML IJ SOLN: 1.0000 mg | INTRAMUSCULAR | Status: DC | PRN

## 2016-08-15 MED ORDER — MORPHINE SULFATE (PF) 2 MG/ML IV SOLN: 2.0000 mg | INTRAVENOUS | Status: DC | PRN

## 2016-08-15 MED ORDER — MORPHINE SULFATE (PF) 4 MG/ML IV SOLN
3.0000 mg | INTRAVENOUS | Status: DC | PRN
  Administered 2016-08-15 – 2016-08-16 (×4): 3 mg via INTRAVENOUS
  Filled 2016-08-15 (×4): qty 1

## 2016-08-15 MED ORDER — OXYCODONE HCL 5 MG PO TABS
5.0000 mg | ORAL_TABLET | ORAL | Status: DC | PRN
  Administered 2016-08-15 – 2016-08-16 (×6): 10 mg via ORAL
  Filled 2016-08-15 (×2): qty 2
  Filled 2016-08-15: qty 1
  Filled 2016-08-15 (×4): qty 2

## 2016-08-15 MED ORDER — AMLODIPINE BESYLATE 5 MG PO TABS
5.0000 mg | ORAL_TABLET | Freq: Every day | ORAL | Status: DC
  Administered 2016-08-15 – 2016-08-18 (×4): 5 mg via ORAL
  Filled 2016-08-15 (×4): qty 1

## 2016-08-15 MED ORDER — MORPHINE SULFATE (PF) 2 MG/ML IV SOLN
2.0000 mg | Freq: Once | INTRAVENOUS | Status: AC
  Administered 2016-08-15: 2 mg via INTRAVENOUS
  Filled 2016-08-15: qty 1

## 2016-08-15 NOTE — Progress Notes (Signed)
General Surgery [facility]  Assessment & Plan:  Ex lap with small bowel resection and resection of metastasis POD#5             Tolerating clears, advance to full liquid diet today             Encouraged OOB, ambulation  Discontinue IV Dilaudid, begin po oxycodone        Earnstine Regal, MD, [facility]       Office: [phone]    Subjective: Patient in bed, comfortable.  Tolerating clear liquid diet.  Output from ostomy started overnight, passing BM and flatus.  Objective: Vital signs in last 24 hours: Temp:  [97.9 F (36.6 C)-98.5 F (36.9 C)] 98.5 F (36.9 C) (12/03 [id number]) Pulse Rate:  [62-79] 62 (12/03 0608) Resp:  [16-18] 16 (12/03 0608) BP: (135-157)/(71-94) 141/94 (12/03 0608) SpO2:  [96 %-98 %] 98 % (12/03 0608) Last BM Date: 08/15/16  Intake/Output from previous day: 12/02 0701 - 12/03 0700 In: 2100 [P.O.:900; I.V.:1200] Out: 3075 [Urine:2700; Drains:75; Stool:300] Intake/Output this shift: Total I/O In: 77 [P.O.:480] Out: 760 [Urine:750; Drains:10]  Physical Exam: HEENT - sclerae clear, mucous membranes moist Abdomen - soft, obese; non-tender; small fecal material in bag; midline dressing dry and intact Neuro - alert & oriented, no focal deficits  Lab Results:   Recent Labs  08/13/16 0419  WBC 9.9  HGB 9.9*  HCT 31.0*  PLT 216   BMET  Recent Labs  08/13/16 0419  NA 143  K 3.6  CL 109  CO2 26  GLUCOSE 84  BUN 15  CREATININE 0.81  CALCIUM 7.8*   PT/INR No results for input(s): LABPROT, INR in the last 72 hours. Comprehensive Metabolic Panel:    Component Value Date/Time   NA 143 08/13/2016 0419   NA 145 08/11/2016 0643   NA 138 01/02/2015 1555   NA 138 11/20/2012 1410   K 3.6 08/13/2016 0419   K 3.7 08/11/2016 0643   K 3.7 01/02/2015 1555   K 3.2 (L) 11/20/2012 1410   CL 109 08/13/2016 0419   CL 112 (H) 08/11/2016 0643   CL 109 01/02/2015 1555   CL 104 11/20/2012 1410   CO2 26 08/13/2016 0419   CO2 23 08/11/2016 0643   CO2 22 01/02/2015 1555   CO2 27 11/20/2012 1410   BUN 15 08/13/2016 0419   BUN 13 08/11/2016 0643   BUN 9 01/02/2015 1555   BUN 9 11/20/2012 1410   CREATININE 0.81 08/13/2016 0419   CREATININE 0.98 08/11/2016 0643   CREATININE 1.04 01/02/2015 1555   CREATININE 0.91 11/20/2012 1410   GLUCOSE 84 08/13/2016 0419   GLUCOSE 128 (H) 08/11/2016 0643   GLUCOSE 110 (H) 01/02/2015 1555   GLUCOSE 95 11/20/2012 1410   CALCIUM 7.8 (L) 08/13/2016 0419   CALCIUM 8.0 (L) 08/11/2016 0643   CALCIUM 8.8 (L) 01/02/2015 1555   CALCIUM 8.1 (L) 11/20/2012 1410   AST 24 08/11/2016 0643   AST 23 08/09/2016 1827   AST 39 01/02/2015 1555   AST 24 11/20/2012 1410   ALT 17 08/11/2016 0643   ALT 18 08/09/2016 1827   ALT 55 01/02/2015 1555   ALT 38 11/20/2012 1410   ALKPHOS 52 08/11/2016 0643   ALKPHOS 58 08/09/2016 1827   ALKPHOS 125 01/02/2015 1555   ALKPHOS 123 11/20/2012 1410   BILITOT 1.2 08/11/2016 0643   BILITOT 0.9 08/09/2016 1827  BILITOT 0.5 01/02/2015 1555   BILITOT 0.5 11/20/2012 1410   PROT 6.1 (L) 08/11/2016 0643   PROT 6.8 08/09/2016 1827   PROT 8.2 (H) 01/02/2015 1555   PROT 7.7 11/20/2012 1410   ALBUMIN 3.6 08/11/2016 0643   ALBUMIN 3.6 08/09/2016 1827   ALBUMIN 4.0 01/02/2015 1555   ALBUMIN 3.1 (L) 11/20/2012 1410    Studies/Results: No results found.    [name] 08/15/2016  [name], male   DOB: [birth year], 55 y.o.   MRN: [id number]

## 2016-08-15 NOTE — Progress Notes (Signed)
PROGRESS NOTE    [name]  [id number] DOB: [birth year] DOA: 08/07/2016 PCP: [name], MD    Brief Narrative:  55 y.o. gentleman with a history of Stage IVB metastatic colon cancer S/P colon resection with LUQ ostomy, sarcoidosis, HTN, significant neuropathy, and chronic pain who presents to the ED for evaluation of progressive abdominal pain associated with nausea and vomiting of feculent appearing emesis.  He reports he has really had intermittent abdominal pain for months, but is has been progressively worse over the past several days.  He is now unable to tolerate PO (last meal was yesterday).  He has not had documented fever, but he has had chills and sweats.  Ostomy output is down.  He has not had hematemesis.  No chest pain or shortness of breath.  General surgery was consulted. fter evaluation by oncology, patient and family had decided upon surgical intervention. Patient with surgery on 08/10/2016; waiting for bowels to wake up now.  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  -diet advancement as per CCS; planning for full liquid today -Continue analgesics; resuming home regimen PO now -Continue IVF hydration, but will start weaning IVF's off. Patient tolerating PO intake and hydration. Will start also transitioning to home PO medication regimen   -will follow CCS post operative recommendations   Colon cancer -Patient followed by Dr.  Rogue Bussing at [place] reviewed. Patient has failed multiple chemotherapy regimens in the past. -Patient is status post colostomy -Appreciate input by [name]. Multiple options were offered to patient, patient ultimately decided on surgical management for above SBO -Oncology recommends pushing PET/CT about 3-4 weeks after surgery with close follow up with [name]  Hypertension -Vital signs reviewed -Blood pressure remains stable, slightly suboptimal -will resume home amlodipine regimen  -continue pain meds and follow vital signs; imagine pain causing some of his elevated BP  Pulmonary sarcoidosis -presently stable -continue pulmonary toiletry  -oxygen supplementation and pulse ox discontinued   Insomnia -Continue with qhs PRN benadryl  Leukocytosis, likely reactive --Labs were reviewed. -will follow trend - no source or signs for infection appreciated -patient has remained afebrile   Morbid obesity -Body mass index is 39.05 kg/m. -low calorie diet and healthy eating habits discussed with patient.  Anxiety -will continue PRN ativan  DVT prophylaxis: SCD's Code Status: DO NOT RESUSCITATE Family Communication: Wifebedside Disposition Plan: Uncertain at this time, will follow CCS rec's; hopefully able to return home with [facility] services.  Consultants:   Oncology  General surgery  Procedures:   Exploratory laparotomy (with colostomy bag and partial omentectomy and small bowel resection)  Antimicrobials: Anti-infectives    Start     Dose/Rate Route Frequency Ordered Stop   08/10/16 0930  cefoTEtan (CEFOTAN) 2 g in dextrose 5 % 50 mL IVPB     2 g 100 mL/hr over 30 Minutes Intravenous On call to O.R. 08/10/16 0856 08/10/16 1600      Subjective: Afebrile, no CP, no SOB. Patient  continue to have pain in his abdomen, but improved. Denies any nausea or vomiting. Tolerated CLD.  Objective: Vitals:   08/14/16 1807 08/14/16 2325 08/15/16 0608 08/15/16 1128    BP: (!) 157/77 (!) 150/74 (!) 141/94 (!) 122/93  Pulse: 67 79 62 68  Resp: 18 18 16 16   Temp: 98.2 F (36.8 C) 98.2 F (36.8 C) 98.5 F (36.9 C) 98.1 F (36.7 C)  TempSrc: Oral Oral Oral Oral  SpO2: 96% 97% 98% 98%  Weight:      Height:        Intake/Output Summary (Last 24 hours) at 08/15/16 1541 Last data filed at 08/15/16 1422  Gross per 24 hour  Intake             2620 ml  Output             3845 ml  Net            -1225 ml   Filed Weights   08/07/16 1634  Weight: 127 kg (280 lb)    Examination:  General exam: Lying in bed, afebrile, in no acute distress. Denies nausea and vomitng. endorses abd pain continue to improved. Tolerated CLD. Colostomy bag with gas and stool material. Respiratory system: Normal chest rise, no audible wheezing Cardiovascular system: Regular rate, S1-S2 auscultated Gastrointestinal system: Decreased bowel sounds, mild distention, incisions w/o drainage. Central nervous system: CN II through XII grossly intact, sensation intact Extremities: Perfused, no clubbing Skin: Normal skin turgor, no notable skin lesions seen Psychiatry: Mood normal, no visual hallucinations  Data Reviewed: I have personally reviewed following labs and imaging studies  CBC:  Recent Labs Lab 08/09/16 0625 08/10/16 0410 08/11/16 0643 08/12/16 0659 08/13/16 0419  WBC 6.6 6.8 11.1* 13.0* 9.9  HGB 11.8* 11.0* 10.7* 10.4* 9.9*  HCT 36.3* 35.3* 33.0* 32.7* 31.0*  MCV 88.8 91.7 88.0 91.3 91.2  PLT 217 222 204 247 [id number]   Basic Metabolic Panel:  Recent Labs Lab 08/09/16 0625 08/09/16 1827 08/11/16 0643 08/13/16 0419  NA 145 147* 145 143  K 3.6 3.6 3.7 3.6  CL 110 112* 112* 109  CO2 27 30 23 26   GLUCOSE 96 92 128* 84  BUN 13 11 13 15   CREATININE 1.01 0.93 0.98 0.81  CALCIUM 8.4* 8.4* 8.0* 7.8*   GFR: Estimated Creatinine Clearance: 138.3 mL/min (by C-G formula based on SCr of 0.81 mg/dL).   Liver Function Tests:  Recent Labs Lab 08/09/16 1827  08/11/16 0643  AST 23 24  ALT 18 17  ALKPHOS 58 52  BILITOT 0.9 1.2  PROT 6.8 6.1*  ALBUMIN 3.6 3.6   Coagulation Profile:  Recent Labs Lab 08/10/16 0410  INR 1.24   CBG: No results for input(s): GLUCAP in the last 168 hours. Sepsis Labs: No results for input(s): PROCALCITON, LATICACIDVEN in the last 168 hours.  Recent Results (from the past 240 hour(s))  Surgical pcr screen     Status: None   Collection Time: 08/10/16  9:10 AM  Result Value Ref Range Status   MRSA, PCR NEGATIVE NEGATIVE Final   Staphylococcus aureus NEGATIVE NEGATIVE Final    Comment:        The Xpert SA Assay (FDA approved for NASAL specimens in patients over 32 years of age), is one component of a comprehensive surveillance program.  Test performance has been validated by [facility] for patients greater than or equal to 30 year old. It is not intended to diagnose infection nor to guide or monitor  treatment.      Radiology Studies: No results found.  Scheduled Meds: . amLODipine  5 mg Oral Daily  . enalaprilat  0.625 mg Intravenous Q6H  . enoxaparin (LOVENOX) injection  40 mg Subcutaneous Q24H  . lip balm  1 application Topical BID   Continuous Infusions: . dextrose 5 % and 0.45 % NaCl with KCl 20 mEq/L 50 mL/hr at 08/15/16 1123     LOS: 8 days   Barton Dubois, MD [facility] Pager [phone]  If 7PM-7AM, please contact night-coverage [email or web address] Password [email or web address] 08/15/2016, 3:41 PM

## 2016-08-15 NOTE — Progress Notes (Signed)
Pt had 3 mg MS at 2108 and 10mg  oxycodone at 2140. At 2230 pt was asleep when I checked on him. At 2310 pt called out complaining that his pain was not controlled and wanted his dilaudid back. MD on call notified. Order for extra 2mg  of MS to be given now and continue alternating the MS and Oxy until tomorrow and folllow up with day shift.

## 2016-08-16 ENCOUNTER — Inpatient Hospital Stay (HOSPITAL_COMMUNITY): Payer: BLUE CROSS/BLUE SHIELD

## 2016-08-16 ENCOUNTER — Ambulatory Visit: Payer: BLUE CROSS/BLUE SHIELD | Admitting: Anesthesiology

## 2016-08-16 DIAGNOSIS — M7989 Other specified soft tissue disorders: Secondary | ICD-10-CM

## 2016-08-16 DIAGNOSIS — M5432 Sciatica, left side: Secondary | ICD-10-CM

## 2016-08-16 DIAGNOSIS — M5431 Sciatica, right side: Secondary | ICD-10-CM

## 2016-08-16 DIAGNOSIS — G894 Chronic pain syndrome: Secondary | ICD-10-CM

## 2016-08-16 MED ORDER — FENTANYL 75 MCG/HR TD PT72
75.0000 ug | MEDICATED_PATCH | TRANSDERMAL | Status: DC
  Administered 2016-08-16: 75 ug via TRANSDERMAL
  Filled 2016-08-16: qty 1

## 2016-08-16 MED ORDER — HYDROMORPHONE HCL 2 MG/ML IJ SOLN
1.0000 mg | INTRAMUSCULAR | Status: DC | PRN
  Administered 2016-08-16 – 2016-08-17 (×7): 2 mg via INTRAVENOUS
  Filled 2016-08-16 (×7): qty 1

## 2016-08-16 MED ORDER — OXYCODONE HCL 5 MG PO TABS
15.0000 mg | ORAL_TABLET | ORAL | Status: DC | PRN
  Administered 2016-08-17 – 2016-08-18 (×6): 15 mg via ORAL
  Filled 2016-08-16 (×6): qty 3

## 2016-08-16 NOTE — Progress Notes (Signed)
*  PRELIMINARY RESULTS* Vascular Ultrasound Lower extremity venous duplex has been completed.  Preliminary findings: no evidence of DVT in visualized veins. Small right baker's cyst noted.  Landry Mellow, RDMS, RVT  08/16/2016, 3:14 PM

## 2016-08-16 NOTE — Progress Notes (Signed)
PROGRESS NOTE    [name]  [id number] DOB: 1960/12/29 DOA: 08/07/2016 PCP: [name], MD    Brief Narrative:  55 y.o. gentleman with a history of Stage IVB metastatic colon cancer S/P colon resection with LUQ ostomy, sarcoidosis, HTN, significant neuropathy, and chronic pain who presents to the ED for evaluation of progressive abdominal pain associated with nausea and vomiting of feculent appearing emesis.  He reports he has really had intermittent abdominal pain for months, but is has been progressively worse over the past several days.  He is now unable to tolerate PO (last meal was yesterday).  He has not had documented fever, but he has had chills and sweats.  Ostomy output is down.  He has not had hematemesis.  No chest pain or shortness of breath.  General surgery was consulted. fter evaluation by oncology, patient and family had decided upon surgical intervention. Patient with surgery on 08/10/2016; waiting for bowels to wake up now.  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  -diet advancement as per CCS; planning to continue Full liquid today -Continue analgesics; resuming home regimen PO now and slowly weaned off IV narcotics -will also continue fentanyl patch -Continue IVF now at 50cc/hr only.    -will follow [facility]CS post operative recommendations   Colon cancer -Patient followed by [name] at [place] reviewed. Patient has failed multiple  chemotherapy regimens in the past. -Patient is status post colostomy -Appreciate input by [name]. Multiple options were offered to patient, patient ultimately decided on surgical management for above SBO -Oncology recommends pushing PET/CT about 3-4 weeks after surgery with close follow up with [name]  Hypertension -Vital signs reviewed -Blood pressure remains stable, slightly suboptimal -will resume home amlodipine regimen  -Continue pain meds and follow vital signs; imagine pain causing some of his elevated BP  Pulmonary sarcoidosis -presently stable -continue pulmonary toiletry  -oxygen supplementation and pulse ox discontinued   Insomnia -Continue with qhs PRN benadryl  Leukocytosis, likely reactive --Labs were reviewed. -will follow trend - no source or signs for infection appreciated -patient has remained afebrile   Morbid obesity -Body mass index is 39.05 kg/m. -low calorie diet and healthy eating habits discussed with patient.  Anxiety -will continue PRN ativan  RLE swelling and pain -neg DVT on LE duplex -positive small baker's cyst  DVT prophylaxis: Lovenox  Code Status: DO NOT RESUSCITATE Family Communication: Wife at bedside Disposition Plan: Uncertain at this time, will follow [facility]CS rec's; hopefully able to return home with [facility] services in the next 3-4 days.  Consultants:   Oncology  General surgery  Procedures:   Exploratory laparotomy (with colostomy bag and partial omentectomy and small bowel resection)  Antimicrobials: Anti-infectives    Start     Dose/Rate Route Frequency Ordered Stop   08/10/16 0930  cefoTEtan (CEFOTAN) 2 g in dextrose 5 % 50 mL IVPB     2 g 100 mL/hr over 30 Minutes Intravenous On call to O.R. 08/10/16 [id number] 08/10/16  1600      Subjective: Afebrile, no CP, no SOB. Patient continue to have pain in his abdomen (even improved overall), endorses problem controlling pain while switching him off IV narcotics. Denies  any nausea or vomiting. Tolerating full liquid diet.  Objective: Vitals:   08/15/16 1128 08/15/16 1828 08/15/16 2327 08/16/16 0619  BP: (!) 122/93 121/80 (!) 146/77 (!) 156/84  Pulse: 68 80 80 78  Resp: 16 16 18 19   Temp: 98.1 F (36.7 C) 98.2 F (36.8 C) 98.4 F (36.9 C) 98.5 F (36.9 C)  TempSrc: Oral Oral Oral Oral  SpO2: 98% 96% 94% 97%  Weight:      Height:        Intake/Output Summary (Last 24 hours) at 08/16/16 1220 Last data filed at 08/16/16 1000  Gross per 24 hour  Intake          2730.84 ml  Output             2600 ml  Net           130.84 ml   Filed Weights   08/07/16 1634  Weight: 127 kg (280 lb)    Examination:  General exam: Lying in bed, afebrile, in no acute distress. Denies nausea and vomitng. endorses abd pain continue and after IV narcotics attempted to be discontinue is bothering him a lot. Colostomy bag with gas and stool material. Respiratory system: Normal chest rise, no audible wheezing Cardiovascular system: Regular rate, S1-S2 auscultated Gastrointestinal system: Decreased bowel sounds, mild distention, incisions w/o drainage. Central nervous system: CN II through XII grossly intact, sensation intact Extremities: Perfused, no clubbing Skin: Normal skin turgor, no notable skin lesions seen Psychiatry: Mood normal, no visual hallucinations  Data Reviewed: I have personally reviewed following labs and imaging studies  CBC:  Recent Labs Lab 08/10/16 0410 08/11/16 0643 08/12/16 0659 08/13/16 0419  WBC 6.8 11.1* 13.0* 9.9  HGB 11.0* 10.7* 10.4* 9.9*  HCT 35.3* 33.0* 32.7* 31.0*  MCV 91.7 88.0 91.3 91.2  PLT 222 204 247 [id number]   Basic Metabolic Panel:  Recent Labs Lab 08/09/16 1827 08/11/16 0643 08/13/16 0419  NA 147* 145 143  K 3.6 3.7 3.6  CL 112* 112* 109  CO2 30 23 26   GLUCOSE 92 128* 84  BUN 11 13 15   CREATININE 0.93 0.98 0.81  CALCIUM 8.4* 8.0* 7.8*   GFR: Estimated Creatinine Clearance: 138.3 mL/min (by C-G formula  based on SCr of 0.81 mg/dL).   Liver Function Tests:  Recent Labs Lab 08/09/16 1827 08/11/16 0643  AST 23 24  ALT 18 17  ALKPHOS 58 52  BILITOT 0.9 1.2  PROT 6.8 6.1*  ALBUMIN 3.6 3.6   Coagulation Profile:  Recent Labs Lab 08/10/16 0410  INR 1.24   CBG: No results for input(s): GLUCAP in the last 168 hours. Sepsis Labs: No results for input(s): PROCALCITON, LATICACIDVEN in the last 168 hours.  Recent Results (from the past 240 hour(s))  Surgical pcr screen     Status: None   Collection Time: 08/10/16  9:10 AM  Result Value Ref Range Status   MRSA, PCR NEGATIVE NEGATIVE Final   Staphylococcus aureus NEGATIVE NEGATIVE Final    Comment:        The Xpert SA Assay (FDA approved for NASAL specimens in patients over 42 years of age), is one component of a comprehensive surveillance program.  Test performance has been validated by [facility] for patients greater than or equal to 44 year old.  It is not intended to diagnose infection nor to guide or monitor treatment.      Radiology Studies: No results found.  Scheduled Meds: . amLODipine  5 mg Oral Daily  . enalaprilat  0.625 mg Intravenous Q6H  . enoxaparin (LOVENOX) injection  40 mg Subcutaneous Q24H  . lip balm  1 application Topical BID   Continuous Infusions: . dextrose 5 % and 0.45 % NaCl with KCl 20 mEq/L 50 mL/hr at 08/15/16 1123     LOS: 9 days   Barton Dubois, MD [facility] Pager [phone]  If 7PM-7AM, please contact night-coverage [email or web address] Password [email or web address] 08/16/2016, 12:20 PM

## 2016-08-17 MED ORDER — POLYVINYL ALCOHOL 1.4 % OP SOLN
1.0000 [drp] | OPHTHALMIC | Status: DC | PRN
  Filled 2016-08-17: qty 15

## 2016-08-17 NOTE — Progress Notes (Signed)
[name], male   DOB: June 07, 1961, 55 y.o.   MRN: [id number] [facility] Surgery Progress Note:   7 Days Post-Op  Subjective: Mental status is clear.   Objective: Vital signs in last 24 hours: Temp:  [98.3 F (36.8 C)] 98.3 F (36.8 C) (12/05 0543) Pulse Rate:  [68-70] 68 (12/05 0543) Resp:  [18] 18 (12/05 0543) BP: (120-144)/(62-76) 120/62 (12/05 0543) SpO2:  [95 %-97 %] 97 % (12/05 0543)  Intake/Output from previous day: 12/04 0701 - 12/05 0700 In: 14 [P.O.:360; I.V.:950] Out: 2770 [Urine:2725; Drains:45] Intake/Output this shift: Total I/O In: -  Out: 43 [Urine:600; Drains:10]  Physical Exam: Work of breathing is normal.  Incision OK.   Lab Results:  No results found for this or any previous visit (from the past 48 hour(s)).  Radiology/Results: No results found.  Anti-infectives: Anti-infectives    Start     Dose/Rate Route Frequency Ordered Stop   08/10/16 0930  cefoTEtan (CEFOTAN) 2 g in dextrose 5 % 50 mL IVPB     2 g 100 mL/hr over 30 Minutes Intravenous On call to O.R. 08/10/16 0856 08/10/16 1600      Assessment/Plan: Problem List: Patient Active Problem List   Diagnosis Date Noted  . Bilateral sciatica   . Small bowel obstruction most likely due to recurrent carcinomatosis 08/07/2016  . 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 Colostomy in place [facility]) 08/07/2016  . 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 Iron deficiency anemia due to chronic blood loss 05/08/2015  . Morbid obesity ([place]) 05/07/2015  . Nausea and vomiting 05/07/2015  . Pulmonary sarcoidosis ([place])   . Abdominal pain, acute 05/05/2015  . Leukocytosis 05/05/2015  . HTN (hypertension) 03/21/2015  . 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 Low back pain 05/28/2014  . Neuropathy, peripheral ([place]) 05/28/2014  . Dry eye 09/25/2013  . 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  Dilaudid and pain patch work well for pain.  Will discontinue JP (serosanguinousl) 7 Days Post-Op    LOS: 10 days   Matt B. Hassell Done, MD, [facility] [phone] beeper [phone]  08/17/2016 10:01 AM

## 2016-08-17 NOTE — Progress Notes (Signed)
PROGRESS NOTE    [name]  [id number] DOB: August 26, 1961 DOA: 08/07/2016 PCP: [name], MD    Brief Narrative:  55 y.o. gentleman with a history of Stage IVB metastatic colon cancer S/P colon resection with LUQ ostomy, sarcoidosis, HTN, significant neuropathy, and chronic pain who presents to the ED for evaluation of progressive abdominal pain associated with nausea and vomiting of feculent appearing emesis.  He reports he has really had intermittent abdominal pain for months, but is has been progressively worse over the past several days.  He is now unable to tolerate PO (last meal was yesterday).  He has not had documented fever, but he has had chills and sweats.  Ostomy output is down.  He has not had hematemesis.  No chest pain or shortness of breath.  General surgery was consulted. fter evaluation by oncology, patient and family had decided upon surgical intervention. Patient with surgery on 08/10/2016; waiting for bowels to wake up now.  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  -diet advancement as per CCS; planning to advance to regular diet now -Continue analgesics; resuming home regimen PO (with oxycodone adjusted to 15mg  instead of 10 now) and slowly weaned off IV narcotics -will also continue fentanyl patch (dose also adjusted to 75mg , instead of 25 as previously used prior to admission) -Continue IVF now at 50cc/hr only.      -will follow [facility] post operative recommendations   Colon cancer -Patient followed by [name] at [place] reviewed. Patient has failed multiple chemotherapy regimens in the past. -Patient is status post colostomy -Appreciate input by [name]. Multiple options were offered to patient, patient ultimately decided on surgical management for above SBO -Oncology recommends pushing PET/CT about 3-4 weeks after surgery with close follow up with [name]  Hypertension -Vital signs reviewed -BP stable now with resumption of home amlodipine regimen  -Continue pain meds and follow vital signs; imagine pain causing some of his elevated BP  Pulmonary sarcoidosis -presently stable -continue pulmonary toiletry  -oxygen supplementation and pulse ox discontinued, as patient with good O2 sat on RA  Insomnia -Continue with qhs PRN benadryl  Leukocytosis, likely reactive -Labs were reviewed. -Will follow trend -No source or signs for infection appreciated -Patient has remained afebrile   Morbid obesity -Body mass index is 39.05 kg/m. -low calorie diet and healthy eating habits discussed with patient.  Anxiety -will continue PRN ativan  RLE swelling and pain -neg DVT on LE duplex -positive small baker's cyst  DVT prophylaxis: Lovenox  Code Status: DO NOT RESUSCITATE Family Communication: Wife at bedside Disposition Plan: Will follow [facility] rec's; hopefully able to return home with [facility] services in the next 2 days or so.  Consultants:   Oncology  General surgery  Procedures:   Exploratory laparotomy (with colostomy bag and partial omentectomy and small bowel resection)  Antimicrobials: Anti-infectives    Start     Dose/Rate Route Frequency Ordered Stop  08/10/16 0930  cefoTEtan (CEFOTAN) 2 g in dextrose 5 % 50 mL IVPB     2 g 100 mL/hr over 30 Minutes Intravenous On call to O.R. 08/10/16 0856 08/10/16 1600      Subjective: Afebrile, no CP, no SOB. Patient  continue to have pain in his abdomen (but much improved with use of dilaudid, adjustment on oxycodone dose and use of fentanyl patch). Denies any nausea or vomiting. Tolerating full liquid diet.  Objective: Vitals:   08/16/16 2339 08/17/16 0543 08/17/16 1012 08/17/16 1140  BP: (!) 144/76 120/62 118/70 127/72  Pulse: 70 68  71  Resp: 18 18  18   Temp: 98.3 F (36.8 C) 98.3 F (36.8 C)  98.2 F (36.8 C)  TempSrc: Oral Oral  Oral  SpO2: 95% 97%  99%  Weight:      Height:        Intake/Output Summary (Last 24 hours) at 08/17/16 1617 Last data filed at 08/17/16 1400  Gross per 24 hour  Intake              550 ml  Output             3455 ml  Net            -2905 ml   Filed Weights   08/07/16 1634  Weight: 127 kg (280 lb)    Examination:  General exam: Sitting on chair, having Lunch. Reports pain is well controlled and denies nausea, vomiting. Colostomy bag with gas and stool material. Respiratory system: Normal chest rise, no audible wheezing Cardiovascular system: Regular rate, S1-S2 auscultated Gastrointestinal system: positive BS, tender to palpation; colostomy in palce, mild distention, incisions w/o drainage. Central nervous system: CN II through XII grossly intact, sensation intact Extremities: Perfused, no clubbing, trace edema bilaterally (R>L) Skin: Normal skin turgor, no notable skin lesions seen Psychiatry: Mood normal, no visual hallucinations  Data Reviewed: I have personally reviewed following labs and imaging studies  CBC:  Recent Labs Lab 08/11/16 0643 08/12/16 0659 08/13/16 0419  WBC 11.1* 13.0* 9.9  HGB 10.7* 10.4* 9.9*  HCT 33.0* 32.7* 31.0*  MCV 88.0 91.3 91.2  PLT 204 247 [id number]   Basic Metabolic Panel:  Recent Labs Lab 08/11/16 0643 08/13/16 0419  NA 145 143  K 3.7 3.6  CL 112* 109  CO2 23 26  GLUCOSE 128* 84  BUN 13 15  CREATININE 0.98 0.81  CALCIUM 8.0* 7.8*   GFR: Estimated Creatinine Clearance: 138.3 mL/min (by C-G formula based  on SCr of 0.81 mg/dL).   Liver Function Tests:  Recent Labs Lab 08/11/16 0643  AST 24  ALT 17  ALKPHOS 52  BILITOT 1.2  PROT 6.1*  ALBUMIN 3.6    Recent Results (from the past 240 hour(s))  Surgical pcr screen     Status: None   Collection Time: 08/10/16  9:10 AM  Result Value Ref Range Status   MRSA, PCR NEGATIVE NEGATIVE Final   Staphylococcus aureus NEGATIVE NEGATIVE Final    Comment:        The Xpert SA Assay (FDA approved for NASAL specimens in patients over 32 years of age), is one component of a comprehensive surveillance program.  Test performance has been validated by [facility] for patients greater than or equal to 52 year old. It is not intended to diagnose infection nor to guide or monitor treatment.      Radiology Studies: No results found.  Scheduled Meds: . amLODipine  5 mg Oral  Daily  . enalaprilat  0.625 mg Intravenous Q6H  . enoxaparin (LOVENOX) injection  40 mg Subcutaneous Q24H  . fentaNYL  75 mcg Transdermal Q48H  . lip balm  1 application Topical BID   Continuous Infusions: . dextrose 5 % and 0.45 % NaCl with KCl 20 mEq/L 50 mL/hr at 08/17/16 0600     LOS: 10 days   Barton Dubois, MD [facility] Pager [phone]  If 7PM-7AM, please contact night-coverage [email or web address] Password [facility] 08/17/2016, 4:17 PM

## 2016-08-18 DIAGNOSIS — C762 Malignant neoplasm of abdomen: Secondary | ICD-10-CM

## 2016-08-18 DIAGNOSIS — C772 Secondary and unspecified malignant neoplasm of intra-abdominal lymph nodes: Secondary | ICD-10-CM

## 2016-08-18 MED ORDER — HEPARIN SOD (PORK) LOCK FLUSH 100 UNIT/ML IV SOLN
500.0000 [IU] | INTRAVENOUS | Status: AC | PRN
  Administered 2016-08-18: 500 [IU]

## 2016-08-18 MED ORDER — LORAZEPAM 0.5 MG PO TABS: 0.5000 mg | ORAL_TABLET | Freq: Two times a day (BID) | ORAL | 0 refills | Status: DC | PRN

## 2016-08-18 MED ORDER — OXYCODONE HCL 10 MG PO TABS: 10.0000 mg | ORAL_TABLET | ORAL | 0 refills | Status: DC | PRN

## 2016-08-18 MED ORDER — FENTANYL 50 MCG/HR TD PT72: 50.0000 ug | MEDICATED_PATCH | TRANSDERMAL | 0 refills | Status: DC

## 2016-08-18 NOTE — Discharge Summary (Signed)
[name], is a 55 y.o. male  DOB 1960-10-29  MRN [id number].  Admission date:  08/07/2016  Admitting Physician  Lily Kocher, MD  Discharge Date:  08/18/2016   Primary MD  [name], MD  Recommendations for primary care physician for things to follow:  - to follow with surgery in 1 week regarding wound check and staple removal - to follow with primary oncologist regarding further recommendation for cancer management - This check CBC, BMP during next visit - -Pain regimen has been adjusted, given postsurgical point, and cancer related pain, fentanyl patch  has been increased from 25-50 g every 72 hours, oxycodone increased from 5-10 mg every 4 hours as needed, started on when necessary Ativan., This should be reassessed during next visit and adjusted as needed   Admission Diagnosis  Small bowel obstruction [K56.609] SBO (small bowel obstruction) [K56.609] Bilateral sciatica [M54.31, M54.32] Encounter for imaging study to confirm nasogastric (NG) tube placement [Z01.89]   Discharge Diagnosis  Small bowel obstruction [K56.609] SBO (small bowel obstruction) [K56.609] Bilateral sciatica [M54.31, M54.32] Encounter for imaging study to confirm nasogastric (NG) tube placement [Z01.89]    Principal Problem:   Small bowel obstruction most likely due to recurrent carcinomatosis Active Problems:   Low back pain   HTN (hypertension)   Leukocytosis   Pulmonary sarcoidosis ([place])   Morbid obesity ([place])   Cancer of descending colon metastatic to intra-abdominal lymph node    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 Abdominal pain    mid abdomen  . Adiposity 03/21/2015  . Arthritis   . Besnier-Boeck disease ([place]) 12/21/2012  . Colon cancer ([place])   . 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 Extreme obesity ([place]) 12/21/2012  . GERD (gastroesophageal reflux disease)   . History of kidney stones   . History of transfusion   . Hypertension    has been off BP med x 6 months  . Iron deficiency anemia due to chronic blood loss 05/08/2015  . LBP (low back pain) 05/28/2014  . Neuropathy ([place])   . PONV (postoperative nausea and vomiting)   . Sarcoid ([place]) 10/2012  . Skin cancer    "it flairs up with sarcoidosis"    Past Surgical History:  Procedure Laterality Date  . CARDIAC CATHETERIZATION    . FLEXIBLE SIGMOIDOSCOPY N/A 05/07/2015   Procedure: FLEXIBLE SIGMOIDOSCOPY;  Surgeon: Laurence Spates, MD;  Location: [facility];  Service: Endoscopy;  Laterality: N/A;  . LAPAROSCOPIC PARTIAL COLECTOMY N/A 05/07/2015   Procedure: LAPAROSCOPIC ASSITED PARTIAL COLECTOMY WITH COLOSTOMY, SMALL BOWEL RESECTION, EXCISION OF PERITONEAL NODULE;  Surgeon: [name], MD;  Location: [facility] ORS;  Service: General;  Laterality: N/A;  . LAPAROTOMY N/A 08/10/2016   Procedure: EXPLORATORY LAPAROTOMY small bowel resection abdominal wall resection partial omentectomy;  Surgeon: Excell Seltzer, MD;  Location: [facility]S;  Service: General;  Laterality: N/A;  . PORTACATH PLACEMENT  Right 05/27/2015   Procedure: INSERTION PORT-A-CATH;  Surgeon: [name], MD;  Location: [facility]S;  Service: General;  Laterality: Right;       History of present illness and  Hospital Course:     Kindly see H&P for history of present illness and admission details, please review complete Labs, Consult reports and Test reports for all details in brief  HPI  from the history and physical done on the day of admission 08/07/2016 HPI: KEYMANI BETTEN is a 55 y.o. gentleman with a history of Stage IVB metastatic colon cancer S/P colon resection with LUQ ostomy, sarcoidosis, HTN, significant neuropathy, and chronic pain who presents to the ED for evaluation of progressive abdominal pain associated with nausea and  vomiting of feculent appearing emesis.  He reports he has really had intermittent abdominal pain for months, but is has been progressively worse over the past several days.  He is now unable to tolerate PO (last meal was yesterday).  He has not had documented fever, but he has had chills and sweats.  Ostomy output is down.  He has not had hematemesis.  No chest pain or shortness of breath.  ED Course: Unfortunately, CT of the abdomen and pelvis is concerning for a small bowel obstruction, likely caused by metastatic cancer.  He may also have progression of disease with new liver lesions identified (full CT report below).  He has received a 1L NS bolus.  An NG tube is being placed in the ED.  He has required a total of 3mg  of IV dilaudid in the ED for pain control.  He is wearing his fentanyl patch (changed yesterday).  General Surgery consult placed from the ED.  Hospitalist asked to admit.   Hospital Course  54 y.o.gentleman with a history of Stage IVB metastatic colon cancer S/P colon resection with LUQ ostomy, sarcoidosis, HTN, significant neuropathy, and chronic pain who presents to the ED for evaluation of progressive abdominal pain associated with nausea and vomiting of feculent appearing emesis.Workup significant for small bowel obstruction due to recurrent carcinomatosis General surgery was consulted, patient and family had decided upon surgical intervention. Patient with surgery on 08/10/2016; small bowel obstruction resolved, tolerating regular diet over last 48 hours.  Small bowel obstruction -status post small bowel resection and omentectomy  -Patient's NGT now removed and also with removal of JP drain; no nausea or vomiting reported.  - Diet advanced gradually by surgery, has been tolerating regular diet for last 24 hours, cleared for discharge by general surgery, to follow with him in one week. - Pain regimen has been adjusted, given postsurgical point, and cancer related pain, fentanyl  patch  has been increased from 25-50 g every 72 hours, oxycodone increased from 5-10 mg every 4 hours as needed, started on when necessary Ativan.  Colon cancer -Patient followed by [name] at [place] reviewed. Patient has failed multiple chemotherapy regimens in the past. -Patient is status post colostomy -Appreciate input by [name]. Multiple options were offered to patient, patient ultimately decided on surgical management for above SBO -Oncology recommends pushing PET/CT about 3-4 weeks after surgery with close follow up with [name]  Hypertension - Opinion with home medication on discharge  Pulmonary sarcoidosis -presently stable -continue pulmonary toiletry  -oxygen supplementation and pulse ox discontinued, as patient with good O2 sat on RA  Insomnia -On Ambien benadryl  Leukocytosis, likely reactive -Resolved  Morbid obesity -Body mass index is 39.05 kg/m.   Anxiety -will continue PRN ativan  RLE swelling and pain -neg DVT on LE duplex -positive small baker's cyst   Discharge Condition:  Stable   Follow UP  Follow-up Information    [name], MD Follow up in 1 week(s).   Specialty:  Family Medicine Contact information: [address] [place] [address] [phone]        Edward Jolly, MD Follow up in 1 week(s).   Specialty:  General Surgery Why:  for wound check and sutures removal Contact information: [address] [place] [address] [phone]        Cammie Sickle, MD Follow up in 2 week(s).   Specialties:  Internal Medicine, Oncology Contact information: [place] [address] [phone]             Discharge Instructions  and  Discharge Medications     Discharge Instructions    Discharge instructions    Complete by:  As directed    Follow with Primary MD [name], MD in 7 days   Get CBC, CMP checked  by  Primary MD next visit.    Activity: As tolerated with Full fall precautions use walker/cane & assistance as needed   Disposition Home    Diet: Heart Healthy , with feeding assistance and aspiration precautions.   On your next visit with your primary care physician please Get Medicines reviewed and adjusted.   Please request your Prim.MD to go over all Hospital Tests and Procedure/Radiological results at the follow up, please get all Hospital records sent to your Prim MD by signing hospital release before you go home.   If you experience worsening of your admission symptoms, develop shortness of breath, life threatening emergency, suicidal or homicidal thoughts you must seek medical attention immediately by calling 911 or calling your MD immediately  if symptoms less severe.  You Must read complete instructions/literature along with all the possible adverse reactions/side effects for all the Medicines you take and that have been prescribed to you. Take any new Medicines after you have completely understood and accpet all the possible adverse reactions/side effects.   Do not drive, operating heavy machinery, perform activities at heights, swimming or participation in water activities or provide baby sitting services if your were admitted for syncope or siezures until you have seen by Primary MD or a Neurologist and advised to do so again.  Do not drive when taking Pain medications.    Do not take more than prescribed Pain, Sleep and Anxiety Medications  Special Instructions: If you have smoked or chewed Tobacco  in the last 2 yrs please stop smoking, stop any regular Alcohol  and or any Recreational drug use.  Wear Seat belts while driving.   Please note  You were cared for by a hospitalist during your hospital stay. If you have any questions about your discharge medications or the care you received while you were in the hospital after you are discharged, you can call the unit and  asked to speak with the hospitalist on call if the hospitalist that took care of you is not available. Once you are discharged, your primary care physician will handle any further medical issues. Please note that NO REFILLS for any discharge medications will be authorized once you are discharged, as it is imperative that you return to your primary care physician (or establish a relationship with a primary care physician if you do not have one) for your aftercare needs so that they  can reassess your need for medications and monitor your lab values.   Discharge wound care:    Complete by:  As directed    Range once daily with dry gauze, may shower, apply Neosporin ointment on wound  daily   Increase activity slowly    Complete by:  As directed        Medication List    STOP taking these medications   fentaNYL 25 MCG/HR patch Commonly known as:  DURAGESIC - dosed mcg/hr Replaced by:  fentaNYL 50 MCG/HR     TAKE these medications   fentaNYL 50 MCG/HR Commonly known as:  DURAGESIC Place 1 patch (50 mcg total) onto the skin every 3 (three) days. Replaces:  fentaNYL 25 MCG/HR patch   lisinopril 10 MG tablet Commonly known as:  PRINIVIL,ZESTRIL Take 10 mg by mouth daily.   LORazepam 0.5 MG tablet Commonly known as:  ATIVAN Take 1 tablet (0.5 mg total) by mouth 2 (two) times daily as needed for anxiety.   Oxycodone HCl 10 MG Tabs Take 1 tablet (10 mg total) by mouth every 4 (four) hours as needed (severe pain). What changed:  medication strength  how much to take  reasons to take this   promethazine 25 MG tablet Commonly known as:  PHENERGAN Take 1 tablet (25 mg total) by mouth every 6 (six) hours as needed for nausea or vomiting.   zolpidem 10 MG tablet Commonly known as:  AMBIEN Take 10-15 mg by mouth at bedtime as needed for sleep.         Diet and Activity recommendation: See Discharge Instructions above   Consults obtained  Gen. surgery Oncology  Major  procedures and Radiology Reports - PLEASE review detailed and final reports for all details, in brief -    Exploratory laparotomy (with colostomy bag and partial omentectomy and small bowel resection) by Dr. Earnest Bailey on 11/28   Dg Abd 1 View  Result Date: 08/09/2016 CLINICAL DATA:  Followup small bowel obstruction. EXAM: ABDOMEN - 1 VIEW COMPARISON:  08/08/2016 FINDINGS: Nasogastric tube tip remains in the proximal stomach. Mild decrease in small bowel dilatation is seen with partial passage of oral contrast material. Colon remains nondilated. These findings are consistent with a partial small bowel obstruction. IMPRESSION: Partial small bowel obstruction. Nasogastric tube in proximal stomach. Electronically Signed   By: Earle Gell M.D.   On: 08/09/2016 07:37   Ct Abdomen Pelvis W Contrast  Result Date: 08/07/2016 CLINICAL DATA:  Abdominal pain, vomiting, colon cancer, colostomy in place EXAM: CT ABDOMEN AND PELVIS WITH CONTRAST TECHNIQUE: Multidetector CT imaging of the abdomen and pelvis was performed using the standard protocol following bolus administration of intravenous contrast. CONTRAST:  132mL ISOVUE-300 IOPAMIDOL (ISOVUE-300) INJECTION 61% COMPARISON:  03/22/2016 FINDINGS: Lower chest: Lung bases shows no acute findings. Small hiatal hernia. Hepatobiliary: There is subtle low-density lesion in right hepatic lobe medially just posterior to IVC measures 1.1 cm. Metastatic disease cannot be excluded. Question second hepatic lesion in right hepatic lobe measures 7 mm. Further correlation with MRI is recommended to exclude metastatic disease. Pancreas: Mild atrophic pancreas without focal abnormality. Spleen: Enhanced spleen is unremarkable. Adrenals/Urinary Tract: No adrenal gland mass. Kidneys are symmetrical in size. Bilateral nonobstructive nephrolithiasis. No hydronephrosis. There is nonobstructive calcified calculus in proximal right ureter measures 4.5 mm. No calcified calculi are noted  within urinary bladder. Stomach/Bowel: There is no gastric outlet obstruction. Again noted postsurgical changes with anastomosis in small bowel in right upper abdomen. There is dilatation of  proximal small bowel and mid small bowel with some fluid and air-fluid levels. A colostomy is noted in left abdomen. Axial image fifty-eight there is transition point in caliber of small bowel in left abdomen just inferior to level of colostomy. Non distended small bowel loops are noted in left lower quadrant. Findings are consistent with complete small bowel obstruction. Findings may be due to adhesion or neoplastic involvement as there is a soft tissue density adjacent to small bowel loops left anterior fascia axial image 60. Metastatic disease cannot be excluded. Again noted a right paramedian ventral hernia containing omental fat without evidence of acute complication. There is a midline ventral hernia containing some omental fat without evidence of acute complication. Please note there is irregular soft tissue density within hernia measures 2.8 cm axial image 41. Metastatic seeding cannot be excluded. A second nodule within hernia axial image 49 measures 8 mm metastatic disease cannot be excluded. The colon entering the colostomy is totally decompressed. There is some residual stool within cecum. The transverse colon is decompressed. Vascular/Lymphatic:  No aortic aneurysm. Reproductive: Prostate gland and seminal vesicles are unremarkable. Other: No ascites or free abdominal air. Musculoskeletal: No destructive bony lesions are noted. Sagittal images of the spine shows mild degenerative changes thoracolumbar spine. IMPRESSION: 1. Findings consistent with small bowel obstruction with transition point in caliber of small bowel. There is total decompression of distal small bowel loops. Findings may be due to adhesion or neoplastic involvement as there is a soft tissue density in axial image 60 left anterior mesentery/fascia  measures about 6.5 cm. 2. There is a ventral hernia in right mid abdomen. There is soft tissue density within hernia measures 2.8 cm. Metastatic seeding cannot be excluded. Clinical correlation is necessary. 3. A colostomy is noted in left abdomen. There is decompression of the distal colon entering the colostomy. 4. No evidence of ascites or free abdominal air. 5. Question subtle low-density lesions within liver. Further correlation with MRI is recommended to exclude metastatic disease. 6. Small hiatal hernia. These results were called by telephone at the time of interpretation on 08/07/2016 at 8:30 pm to Dr. Montine Circle , who verbally acknowledged these results. Electronically Signed   By: Lahoma Crocker M.D.   On: 08/07/2016 20:31   Dg Abd 2 Views  Result Date: 08/10/2016 CLINICAL DATA:  History of colon cancer. EXAM: ABDOMEN - 2 VIEW COMPARISON:  08/09/2016.  CT 08/07/2016. FINDINGS: NG tube noted in stable position coils in the upper stomach. Persistent dilated loops of small bowel noted. Small-bowel distention and increased slightly from prior exam. Small bowel diameter measures up to 4.5 cm. There is a paucity of intra colonic gas. No free air. Left nephrolithiasis noted. Pelvic calcifications consistent phleboliths. Surgical clips are noted over the pelvis. No acute bony abnormality . IMPRESSION: 1. NG tube again noted coiled in the stomach. 2. Persistent prominently dilated loops of small bowel with a paucity of colonic air. Findings consistent with persistent small-bowel obstruction. Interim slight progression of small-bowel distention noted with small-bowel diameter measuring up to 4.5 cm. No free air. Electronically Signed   By: Marcello Moores  Register   On: 08/10/2016 08:50   Dg Abd Portable 1v-small Bowel Obstruction Protocol-initial, 8 Hr Delay  Result Date: 08/08/2016 CLINICAL DATA:  Small bowel obstruction EXAM: PORTABLE ABDOMEN - 1 VIEW COMPARISON:  CT scan 08/07/2016 FINDINGS: Contrast material  noted within distended small bowel loops consistent with small bowel obstruction. Paucity of bowel gas within colon. Contrast material from recent CT  scan noted within urinary bladder. IMPRESSION: Contrast material noted within distended small bowel loops consistent with small bowel obstruction. Electronically Signed   By: Lahoma Crocker M.D.   On: 08/08/2016 10:04   Dg Abd Portable 1v-small Bowel Protocol-position Verification  Result Date: 08/07/2016 CLINICAL DATA:  Status post enteric tube placement. EXAM: PORTABLE ABDOMEN - 1 VIEW COMPARISON:  CT abdomen 08/07/2016. FINDINGS: Enteric tube tip projects over the left upper quadrant. Prominent loops of small bowel demonstrated throughout the abdomen. Contrast material within the right renal collecting system. IMPRESSION: Enteric tube tip projects over the left upper quadrant, likely within the stomach. Electronically Signed   By: Lovey Newcomer M.D.   On: 08/07/2016 22:50    Micro Results    Recent Results (from the past 240 hour(s))  Surgical pcr screen     Status: None   Collection Time: 08/10/16  9:10 AM  Result Value Ref Range Status   MRSA, PCR NEGATIVE NEGATIVE Final   Staphylococcus aureus NEGATIVE NEGATIVE Final    Comment:        The Xpert SA Assay (FDA approved for NASAL specimens in patients over 67 years of age), is one component of a comprehensive surveillance program.  Test performance has been validated by [facility] for patients greater than or equal to 18 year old. It is not intended to diagnose infection nor to guide or monitor treatment.        Today   Subjective:   Lila Hrabe today has no headache,no chest  pain,, feels much better wants to go home today. Tolerating regular diet for last 24 hours.  Objective:   Blood pressure 131/71, pulse 71, temperature 98.3 F (36.8 C), temperature source Oral, resp. rate 18, height 5\' 11"  (1.803 m), weight 127 kg (280 lb), SpO2 98 %.   Intake/Output Summary (Last 24  hours) at 08/18/16 1055 Last data filed at 08/18/16 0856  Gross per 24 hour  Intake          1915.33 ml  Output             2130 ml  Net          -214.67 ml    Exam Awake Alert, Oriented x 3, No new F.N deficits, Normal affect [place].AT,PERRAL Supple Neck,No JVD,  Symmetrical Chest wall movement, Good air movement bilaterally, CTAB RRR,No Gallops,Rubs or new Murmurs, No Parasternal Heave +ve B.Sounds, Abd Soft, Non tender,colostomy in palce,incisions w/o drainage No Cyanosis, Clubbing or edema, No new Rash or bruise  Data Review   CBC w Diff: Lab Results  Component Value Date   WBC 9.9 08/13/2016   HGB 9.9 (L) 08/13/2016   HGB 10.6 (L) 01/02/2015   HCT 31.0 (L) 08/13/2016   HCT 27.3 (L) 03/24/2015   PLT 216 08/13/2016   PLT 521 (H) 03/24/2015   LYMPHOPCT 10 08/07/2016   LYMPHOPCT 20.7 01/02/2015   MONOPCT 10 08/07/2016   MONOPCT 9.2 01/02/2015   EOSPCT 0 08/07/2016   EOSPCT 0.8 01/02/2015   BASOPCT 0 08/07/2016   BASOPCT 1.0 01/02/2015    CMP: Lab Results  Component Value Date   NA 143 08/13/2016   NA 138 01/02/2015   K 3.6 08/13/2016   K 3.7 01/02/2015   CL 109 08/13/2016   CL 109 01/02/2015   CO2 26 08/13/2016   CO2 22 01/02/2015   BUN 15 08/13/2016   BUN 9 01/02/2015   CREATININE 0.81 08/13/2016   CREATININE 1.04 01/02/2015   PROT 6.1 (L)  08/11/2016   PROT 8.2 (H) 01/02/2015   ALBUMIN 3.6 08/11/2016   ALBUMIN 4.0 01/02/2015   BILITOT 1.2 08/11/2016   BILITOT 0.5 01/02/2015   ALKPHOS 52 08/11/2016   ALKPHOS 125 01/02/2015   AST 24 08/11/2016   AST 39 01/02/2015   ALT 17 08/11/2016   ALT 55 01/02/2015  .   Total Time in preparing paper work, data evaluation and todays exam - 35 minutes  Pierson Vantol M.D on 08/18/2016 at 10:55 AM  [facility]   Office  [phone]

## 2016-08-18 NOTE — Discharge Instructions (Signed)
Follow with Primary MD [name], MD in 7 days   Get CBC, CMP checked  by Primary MD next visit.    Activity: As tolerated with Full fall precautions use walker/cane & assistance as needed   Disposition Home    Diet: Heart Healthy , with feeding assistance and aspiration precautions.   On your next visit with your primary care physician please Get Medicines reviewed and adjusted.   Please request your Prim.MD to go over all Hospital Tests and Procedure/Radiological results at the follow up, please get all Hospital records sent to your Prim MD by signing hospital release before you go home.   If you experience worsening of your admission symptoms, develop shortness of breath, life threatening emergency, suicidal or homicidal thoughts you must seek medical attention immediately by calling 911 or calling your MD immediately  if symptoms less severe.  You Must read complete instructions/literature along with all the possible adverse reactions/side effects for all the Medicines you take and that have been prescribed to you. Take any new Medicines after you have completely understood and accpet all the possible adverse reactions/side effects.   Do not drive, operating heavy machinery, perform activities at heights, swimming or participation in water activities or provide baby sitting services if your were admitted for syncope or siezures until you have seen by Primary MD or a Neurologist and advised to do so again.  Do not drive when taking Pain medications.    Do not take more than prescribed Pain, Sleep and Anxiety Medications  Special Instructions: If you have smoked or chewed Tobacco  in the last 2 yrs please stop smoking, stop any regular Alcohol  and or any Recreational drug use.  Wear Seat belts while driving.   Please note  You were cared for by a hospitalist during your hospital stay. If you have any questions about your discharge medications or the care you received  while you were in the hospital after you are discharged, you can call the unit and asked to speak with the hospitalist on call if the hospitalist that took care of you is not available. Once you are discharged, your primary care physician will handle any further medical issues. Please note that NO REFILLS for any discharge medications will be authorized once you are discharged, as it is imperative that you return to your primary care physician (or establish a relationship with a primary care physician if you do not have one) for your aftercare needs so that they can reassess your need for medications and monitor your lab values.

## 2016-08-18 NOTE — Progress Notes (Signed)
[name], male   DOB: 26-Oct-1960, 54 y.o.   MRN: [id number] [facility] Surgery Progress Note:   8 Days Post-Op  Subjective: Mental status is clear.  Up walking  Objective: Vital signs in last 24 hours: Temp:  [98.2 F (36.8 C)-99 F (37.2 C)] 98.3 F (36.8 C) (12/06 0513) Pulse Rate:  [69-71] 71 (12/06 0513) Resp:  [18] 18 (12/06 0513) BP: (107-131)/(42-72) 131/71 (12/06 0513) SpO2:  [97 %-99 %] 98 % (12/06 0513)  Intake/Output from previous day: 12/05 0701 - 12/06 0700 In: 1743.3 [P.O.:560; I.V.:1183.3] Out: 2460 [Urine:2400; Drains:10; Stool:50] Intake/Output this shift: Total I/O In: 372 [P.O.:372] Out: 280 [Urine:280]  Physical Exam: Work of breathing is not labored.  He is eating and feeling better.    Lab Results:  No results found for this or any previous visit (from the past 48 hour(s)).  Radiology/Results: No results found.  Anti-infectives: Anti-infectives    Start     Dose/Rate Route Frequency Ordered Stop   08/10/16 0930  cefoTEtan (CEFOTAN) 2 g in dextrose 5 % 50 mL IVPB     2 g 100 mL/hr over 30 Minutes Intravenous On call to O.R. 08/10/16 0856 08/10/16 1600      Assessment/Plan: Problem List: Patient Active Problem List   Diagnosis Date Noted  . Bilateral sciatica   . Small bowel obstruction most likely due to recurrent carcinomatosis 08/07/2016  . 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 Colostomy in place [facility]) 08/07/2016  . 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 Iron deficiency anemia due to chronic blood loss 05/08/2015  . Morbid obesity ([place]) 05/07/2015  . Nausea and vomiting 05/07/2015  . Pulmonary sarcoidosis ([place])   . Abdominal pain, acute 05/05/2015  . Leukocytosis 05/05/2015  . HTN (hypertension) 03/21/2015  . 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 Low back pain 05/28/2014  . Neuropathy, peripheral ([place]) 05/28/2014   . Dry eye 09/25/2013  . Besnier-Boeck disease ([place]) 12/21/2012    OK to discharge from surgery standpoint.  Will have pain issues related to his cancer.   8 Days Post-Op    LOS: 11 days   Matt B. Hassell Done, MD, [facility] [phone] beeper [phone]  08/18/2016 10:11 AM

## 2016-09-17 ENCOUNTER — Inpatient Hospital Stay: Payer: BLUE CROSS/BLUE SHIELD | Attending: Internal Medicine | Admitting: Internal Medicine

## 2016-09-17 VITALS — BP 121/88 | HR 103 | Temp 96.4°F | Wt 278.5 lb

## 2016-09-17 DIAGNOSIS — K219 Gastro-esophageal reflux disease without esophagitis: Secondary | ICD-10-CM | POA: Diagnosis not present

## 2016-09-17 DIAGNOSIS — C787 Secondary malignant neoplasm of liver and intrahepatic bile duct: Secondary | ICD-10-CM | POA: Insufficient documentation

## 2016-09-17 DIAGNOSIS — C786 Secondary malignant neoplasm of retroperitoneum and peritoneum: Secondary | ICD-10-CM | POA: Diagnosis not present

## 2016-09-17 DIAGNOSIS — C772 Secondary and unspecified malignant neoplasm of intra-abdominal lymph nodes: Secondary | ICD-10-CM | POA: Diagnosis not present

## 2016-09-17 DIAGNOSIS — Z85828 Personal history of other malignant neoplasm of skin: Secondary | ICD-10-CM | POA: Insufficient documentation

## 2016-09-17 DIAGNOSIS — I10 Essential (primary) hypertension: Secondary | ICD-10-CM | POA: Insufficient documentation

## 2016-09-17 DIAGNOSIS — E669 Obesity, unspecified: Secondary | ICD-10-CM | POA: Diagnosis not present

## 2016-09-17 DIAGNOSIS — Z7189 Other specified counseling: Secondary | ICD-10-CM

## 2016-09-17 DIAGNOSIS — Z79899 Other long term (current) drug therapy: Secondary | ICD-10-CM | POA: Diagnosis not present

## 2016-09-17 DIAGNOSIS — C186 Malignant neoplasm of descending colon: Secondary | ICD-10-CM | POA: Diagnosis not present

## 2016-09-17 DIAGNOSIS — Z7722 Contact with and (suspected) exposure to environmental tobacco smoke (acute) (chronic): Secondary | ICD-10-CM | POA: Insufficient documentation

## 2016-09-17 DIAGNOSIS — R21 Rash and other nonspecific skin eruption: Secondary | ICD-10-CM

## 2016-09-17 DIAGNOSIS — K56609 Unspecified intestinal obstruction, unspecified as to partial versus complete obstruction: Secondary | ICD-10-CM | POA: Insufficient documentation

## 2016-09-17 DIAGNOSIS — D869 Sarcoidosis, unspecified: Secondary | ICD-10-CM | POA: Diagnosis not present

## 2016-09-17 MED ORDER — LORAZEPAM 0.5 MG PO TABS: 0.5000 mg | ORAL_TABLET | Freq: Two times a day (BID) | ORAL | 0 refills | Status: DC | PRN

## 2016-09-17 MED ORDER — OXYCODONE HCL 10 MG PO TABS: 10.0000 mg | ORAL_TABLET | ORAL | 0 refills | Status: DC | PRN

## 2016-09-17 MED ORDER — FENTANYL 50 MCG/HR TD PT72
50.0000 ug | MEDICATED_PATCH | TRANSDERMAL | 0 refills | Status: DC
Start: 2016-09-17 — End: 2016-10-12

## 2016-09-17 NOTE — Progress Notes (Signed)
Patient here today for follow up from ED visit.  Patient had vomiting 2 days ago

## 2016-09-17 NOTE — Progress Notes (Signed)
START ON PATHWAY REGIMEN - Colorectal  COS76: FOLFIRI + Panitumumab q14 Days   A cycle is every 14 days:     Panitumumab (Vectibix(R)) 6 mg/kg in 171m NS IV over 60 minutes day 1, q14 days, through a 0.2 micron filter.  If first dose is tolerated, may infuse subsequent doses over 30-60 minutes. Flush line with NS before and after infusion. For doses over  1000 mg, infuse over 90 minutes. Max final conc of 10 mg/mL.   Monitor for infusion reactions and dermatologic toxicity . Dose Mod: None     Irinotecan (Camptosar(R)) 180 mg/m2 in 500 mL D5W IV over 90 minutes day 1, q14 days Dose Mod: None     Leucovorin 400 mg/m2 in 250 mL D5W IV over 2 hours day 1, q14 days, followed immediately by Dose Mod: None     5-Fluorouracil 400 mg/m2 IV bolus over 2-4 minutes day 1, q14 days Dose Mod: None     5-Fluorouracil 2,400 mg/m2/46h CIV in ______mL NS IV as a 46 hour infusion starting on day 1, q14 days Dose Mod: None Additional Orders: **Note: order sheet contains two q14 day cycles**  **Always confirm dose/schedule in your pharmacy ordering system**    Patient Characteristics: Metastatic Colorectal, First Line, Nonsurgical Candidate, KRAS/NRAS Wild-Type, BRAF Wild-Type/Unknown, PS = 0,1; Bevacizumab Ineligible AJCC T Stage: X AJCC N Stage: X AJCC Stage Grouping: IVB AJCC M Stage: X Current evidence of distant metastases? Yes BRAF Mutation Status: Wild Type (no mutation) KRAS/NRAS Mutation Status: Wild Type (no mutation) Line of therapy: First Line Would you be surprised if this patient died  in the next year? I would be surprised if this patient died in the next year Performance Status: PS = 0, 1 Bevacizumab Eligibility: Ineligible  Intent of Therapy: Non-Curative / Palliative Intent, Discussed with Patient

## 2016-09-17 NOTE — Assessment & Plan Note (Addendum)
#   Colon cancer with metastases- peritoneal/omental metastases-status post small bowel resection; liver metastases. Get PET prior to next visit.   # Had a long discussion the patient and his wife regarding the natural history of Metastatic colon cancer and that the median survival is approximately 2 years. Recommend palliative chemotherapy- with FOLFIRI with vectibex.  # Discussed the potential side effects including but not limited to-increasing fatigue, nausea vomiting, diarrhea, hair loss, sores in the mouth, increase risk of infection and also neuropathy. Rash was also discussed.  # Reviewed/counselled regarding the goals of care- being palliative/treatment are usually indefinite-until progression or side effects. Goal is to maintain quality of life as the disease is incurable. Patient wanted to know the life expectancy without any treatment- which would be in the order of many months.   # pain management/chronic pain- fentanyl 50 g every 72 hours and oxycodone every 4 hours.  # Chronic neuropathy- stable/ pain management.   #Follow-up approximately 2 weeks; labs CEA PET scan prior.

## 2016-09-17 NOTE — Progress Notes (Signed)
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 Specimen integrity: Intact. Macroscopic tumor perforation: Not identified. Invasive tumor: Maximum size: 5.5 cm. Histologic type(s): Invasive adenocarcinoma. Histologic grade and differentiation: G1: well differentiated/low grade  Pathologic Staging: pT4b, pN1b, pM1b . 2.Patient was started on FOLFOX in September of 2016,after 2 cycles of chemotherapy patient developed neuropathy. Patient had a previous neuropathy as a baseline which increased so was switched over to FOLFIRI I IN December of 2016 As patient has clear os wild-type we can proceed to add either a Avastin or cetuximab from the next chemotherapy.(November, 2016)  Avastin was added as abdominal wound has completely healed now Avastin on hold (December, 2016) because of hypertension Chemotherapy was discontinued after protocol 8 cycle because of significant side effect (last chemotherapy was on October 28, 2015).  # DEC 2017- small bowel obstruction s/p resection [Dr.Hoxworth]- positive for recurrence.   # JAN 2018-  FOLFIRI + vectibex  # Hx of Bil  Kidney stones  # sarcoidosis/ Lung- [[facility]]- on surveillance  # Neuropathy- lyrica -off. ? 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  male pleasant patient above history of metastatic colon cancer currently off therapy since febl 2017- Noted to have a recent recurrence in the abdomen and liver when he presented to the hospital [facility] in [place] with small bowel obstruction. I reviewed the hospitalization progress notes operative note and also discharge summary in detail.   Patient underwent small bowel resection; and also omental resection. He is currently improving.  Continues to have chronic pain. Continues to be on fentanyl patch and oxycodone. Continues to have issues with anxiety. Continues to have chronic tingling and numbness of his extremity is.  Appetite fair. No blood in stools or black stools.  REVIEW OF SYSTEMS:  A complete 10 point review of system is done which is negative except mentioned above/history of present illness.   PAST MEDICAL HISTORY :  Past Medical History:  Diagnosis Date  . Abdominal pain    mid abdomen  . Adiposity 03/21/2015  . Arthritis   . Besnier-Boeck disease ([place]) 12/21/2012  . Colon cancer ([place])   . 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 Extreme obesity ([place]) 12/21/2012  . GERD (gastroesophageal reflux disease)   . History of kidney stones   . History of transfusion   . Hypertension    has been off BP med x 6 months  .  Iron deficiency anemia due to chronic blood loss 05/08/2015  . LBP (low back pain) 05/28/2014  . Neuropathy ([place])   . PONV (postoperative nausea and vomiting)   . Sarcoid ([place]) 10/2012  . Skin  cancer    "it flairs up with sarcoidosis"    PAST SURGICAL HISTORY :   Past Surgical History:  Procedure Laterality Date  . CARDIAC CATHETERIZATION    . FLEXIBLE SIGMOIDOSCOPY N/A 05/07/2015   Procedure: FLEXIBLE SIGMOIDOSCOPY;  Surgeon: Laurence Spates, MD;  Location: [facility];  Service: Endoscopy;  Laterality: N/A;  . LAPAROSCOPIC PARTIAL COLECTOMY N/A 05/07/2015   Procedure: LAPAROSCOPIC ASSITED PARTIAL COLECTOMY WITH COLOSTOMY, SMALL BOWEL RESECTION, EXCISION OF PERITONEAL NODULE;  Surgeon: [name], MD;  Location: [facility] ORS;  Service: General;  Laterality: N/A;  . LAPAROTOMY N/A 08/10/2016   Procedure: EXPLORATORY LAPAROTOMY small bowel resection abdominal wall resection partial omentectomy;  Surgeon: Excell Seltzer, MD;  Location: [facility] ORS;  Service: General;  Laterality: N/A;  . PORTACATH PLACEMENT Right 05/27/2015   Procedure: INSERTION PORT-A-CATH;  Surgeon: [name], MD;  Location: [facility] ORS;  Service: General;  Laterality: Right;    FAMILY HISTORY :   Family History  Problem Relation Age of Onset  . Arthritis Mother   . Heart disease Mother   . Stroke Father   . Prostate cancer Neg Hx   . Bladder Cancer Neg Hx     SOCIAL HISTORY:   Social History  Substance Use Topics  . Smoking status: Passive Smoke Exposure - Never Smoker  . Smokeless tobacco: Never Used  . Alcohol use No    ALLERGIES:  is allergic to versed [midazolam]; gabapentin; and paba derivatives.  MEDICATIONS:  Current Outpatient Prescriptions  Medication Sig Dispense Refill  . fentaNYL (DURAGESIC) 50 MCG/HR Place 1 patch (50 mcg total) onto the skin every 3 (three) days. 5 patch 0  . lisinopril (PRINIVIL,ZESTRIL) 10 MG tablet Take 10 mg by mouth daily.    Marland Kitchen LORazepam (ATIVAN) 0.5 MG tablet Take 1 tablet (0.5 mg total) by mouth 2 (two) times daily as needed for anxiety. 60 tablet 0  . Oxycodone HCl 10 MG TABS Take 1 tablet (10 mg total) by mouth every 4 (four) hours as needed (severe pain). 60  tablet 0  . promethazine (PHENERGAN) 25 MG tablet Take 1 tablet (25 mg total) by mouth every 6 (six) hours as needed for nausea or vomiting. 30 tablet 1  . zolpidem (AMBIEN) 10 MG tablet Take 10-15 mg by mouth at bedtime as needed for sleep.     No current facility-administered medications for this visit.    Facility-Administered Medications Ordered in Other Visits  Medication Dose Route Frequency Provider Last Rate Last Dose  . sodium chloride flush (NS) 0.9 % injection 10 mL  10 mL Intravenous PRN Forest Gleason, MD   10 mL at 02/11/16 1326    PHYSICAL EXAMINATION: ECOG PERFORMANCE STATUS: 0 - Asymptomatic  BP 121/88 (BP Location: Right Arm, Patient Position: Sitting)   Pulse (!) 103   Temp (!) 96.4 F (35.8 C) (Tympanic)   Wt 278 lb 8 oz (126.3 kg)   BMI 38.84 kg/m   Filed Weights   09/17/16 1130  Weight: 278 lb 8 oz (126.3 kg)    GENERAL: Well-nourished well-developed; Alert, no distress and comfortable.  Obese. Accompanied by is wife.  EYES: no pallor or icterus OROPHARYNX: no thrush or ulceration; good dentition  NECK: supple, no masses felt LYMPH:  no palpable lymphadenopathy in the cervical, axillary or  inguinal regions LUNGS: clear to auscultation and  No wheeze or crackles HEART/CVS: regular rate & rhythm and no murmurs; No lower extremity edema ABDOMEN:abdomen soft, non-tender and normal bowel sounds; Positive for colostomy. Incisions were healing. Musculoskeletal:no cyanosis of digits and no clubbing  PSYCH: alert & oriented x 3 with fluent speech; anxious/ depressed/tearful NEURO: no focal motor/sensory deficits SKIN:  no rashes or significant lesions  LABORATORY DATA:  I have reviewed the data as listed    Component Value Date/Time   NA 143 08/13/2016 0419   NA 138 01/02/2015 1555   K 3.6 08/13/2016 0419   K 3.7 01/02/2015 1555   CL 109 08/13/2016 0419   CL 109 01/02/2015 1555   CO2 26 08/13/2016 0419   CO2 22 01/02/2015 1555   GLUCOSE 84 08/13/2016  0419   GLUCOSE 110 (H) 01/02/2015 1555   BUN 15 08/13/2016 0419   BUN 9 01/02/2015 1555   CREATININE 0.81 08/13/2016 0419   CREATININE 1.04 01/02/2015 1555   CALCIUM 7.8 (L) 08/13/2016 0419   CALCIUM 8.8 (L) 01/02/2015 1555   PROT 6.1 (L) 08/11/2016 0643   PROT 8.2 (H) 01/02/2015 1555   ALBUMIN 3.6 08/11/2016 0643   ALBUMIN 4.0 01/02/2015 1555   AST 24 08/11/2016 0643   AST 39 01/02/2015 1555   ALT 17 08/11/2016 0643   ALT 55 01/02/2015 1555   ALKPHOS 52 08/11/2016 0643   ALKPHOS 125 01/02/2015 1555   BILITOT 1.2 08/11/2016 0643   BILITOT 0.5 01/02/2015 1555   GFRNONAA >60 08/13/2016 0419   GFRNONAA >60 01/02/2015 1555   GFRAA >60 08/13/2016 0419   GFRAA >60 01/02/2015 1555    No results found for: SPEP, UPEP  Lab Results  Component Value Date   WBC 9.9 08/13/2016   NEUTROABS 11.4 (H) 08/07/2016   HGB 9.9 (L) 08/13/2016   HCT 31.0 (L) 08/13/2016   MCV 91.2 08/13/2016   PLT 216 08/13/2016      Chemistry      Component Value Date/Time   NA 143 08/13/2016 0419   NA 138 01/02/2015 1555   K 3.6 08/13/2016 0419   K 3.7 01/02/2015 1555   CL 109 08/13/2016 0419   CL 109 01/02/2015 1555   CO2 26 08/13/2016 0419   CO2 22 01/02/2015 1555   BUN 15 08/13/2016 0419   BUN 9 01/02/2015 1555   CREATININE 0.81 08/13/2016 0419   CREATININE 1.04 01/02/2015 1555      Component Value Date/Time   CALCIUM 7.8 (L) 08/13/2016 0419   CALCIUM 8.8 (L) 01/02/2015 1555   ALKPHOS 52 08/11/2016 0643   ALKPHOS 125 01/02/2015 1555   AST 24 08/11/2016 0643   AST 39 01/02/2015 1555   ALT 17 08/11/2016 0643   ALT 55 01/02/2015 1555   BILITOT 1.2 08/11/2016 0643   BILITOT 0.5 01/02/2015 1555       RADIOGRAPHIC STUDIES: I have personally reviewed the radiological images as listed and agreed with the findings in the report. No results found.   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 Get PET prior to next visit.   # Had a long discussion the patient and his wife regarding the natural history of Metastatic colon cancer and that the median survival is approximately 2 years. Recommend palliative chemotherapy- with FOLFIRI with vectibex.  # Discussed the potential side effects including but not limited to-increasing  fatigue, nausea vomiting, diarrhea, hair loss, sores in the mouth, increase risk of infection and also neuropathy. Rash was also discussed.  # Reviewed/counselled regarding the goals of care- being palliative/treatment are usually indefinite-until progression or side effects. Goal is to maintain quality of life as the disease is incurable. Patient wanted to know the life expectancy without any treatment- which would be in the order of many months.   # pain management/chronic pain- fentanyl 50 g every 72 hours and oxycodone every 4 hours.  # Chronic neuropathy- stable/ pain management.   #Follow-up approximately 2 weeks; labs CEA PET scan prior.    Orders Placed This Encounter  Procedures  . CBC with Differential/Platelet    Standing Status:   Future    Standing Expiration Date:   03/17/2017  . Comprehensive metabolic panel    Standing Status:   Future    Standing Expiration Date:   03/17/2017  . CEA    Standing Status:   Future    Standing Expiration Date:   03/17/2017       Cammie Sickle, MD 09/17/2016 1:04 PM

## 2016-09-17 NOTE — Progress Notes (Signed)
Patient on plan of care prior to pathways. 

## 2016-09-20 ENCOUNTER — Telehealth: Payer: Self-pay | Admitting: *Deleted

## 2016-09-20 MED ORDER — PROMETHAZINE HCL 25 MG PO TABS: 25.0000 mg | ORAL_TABLET | Freq: Four times a day (QID) | ORAL | 1 refills | Status: DC | PRN

## 2016-09-20 NOTE — Telephone Encounter (Signed)
Promethazine not sent to pharmacy

## 2016-09-27 ENCOUNTER — Other Ambulatory Visit: Payer: Self-pay | Admitting: *Deleted

## 2016-09-27 MED ORDER — OXYCODONE HCL 10 MG PO TABS: 10.0000 mg | ORAL_TABLET | ORAL | 0 refills | Status: DC | PRN

## 2016-09-29 IMAGING — DX DG CHEST 1V PORT
1 series · 1 of 1 positions shown · non-contrast
Comparison: None

CLINICAL DATA: Port-A-Cath placement

EXAM:
AP chest radiograph

[chest ap]
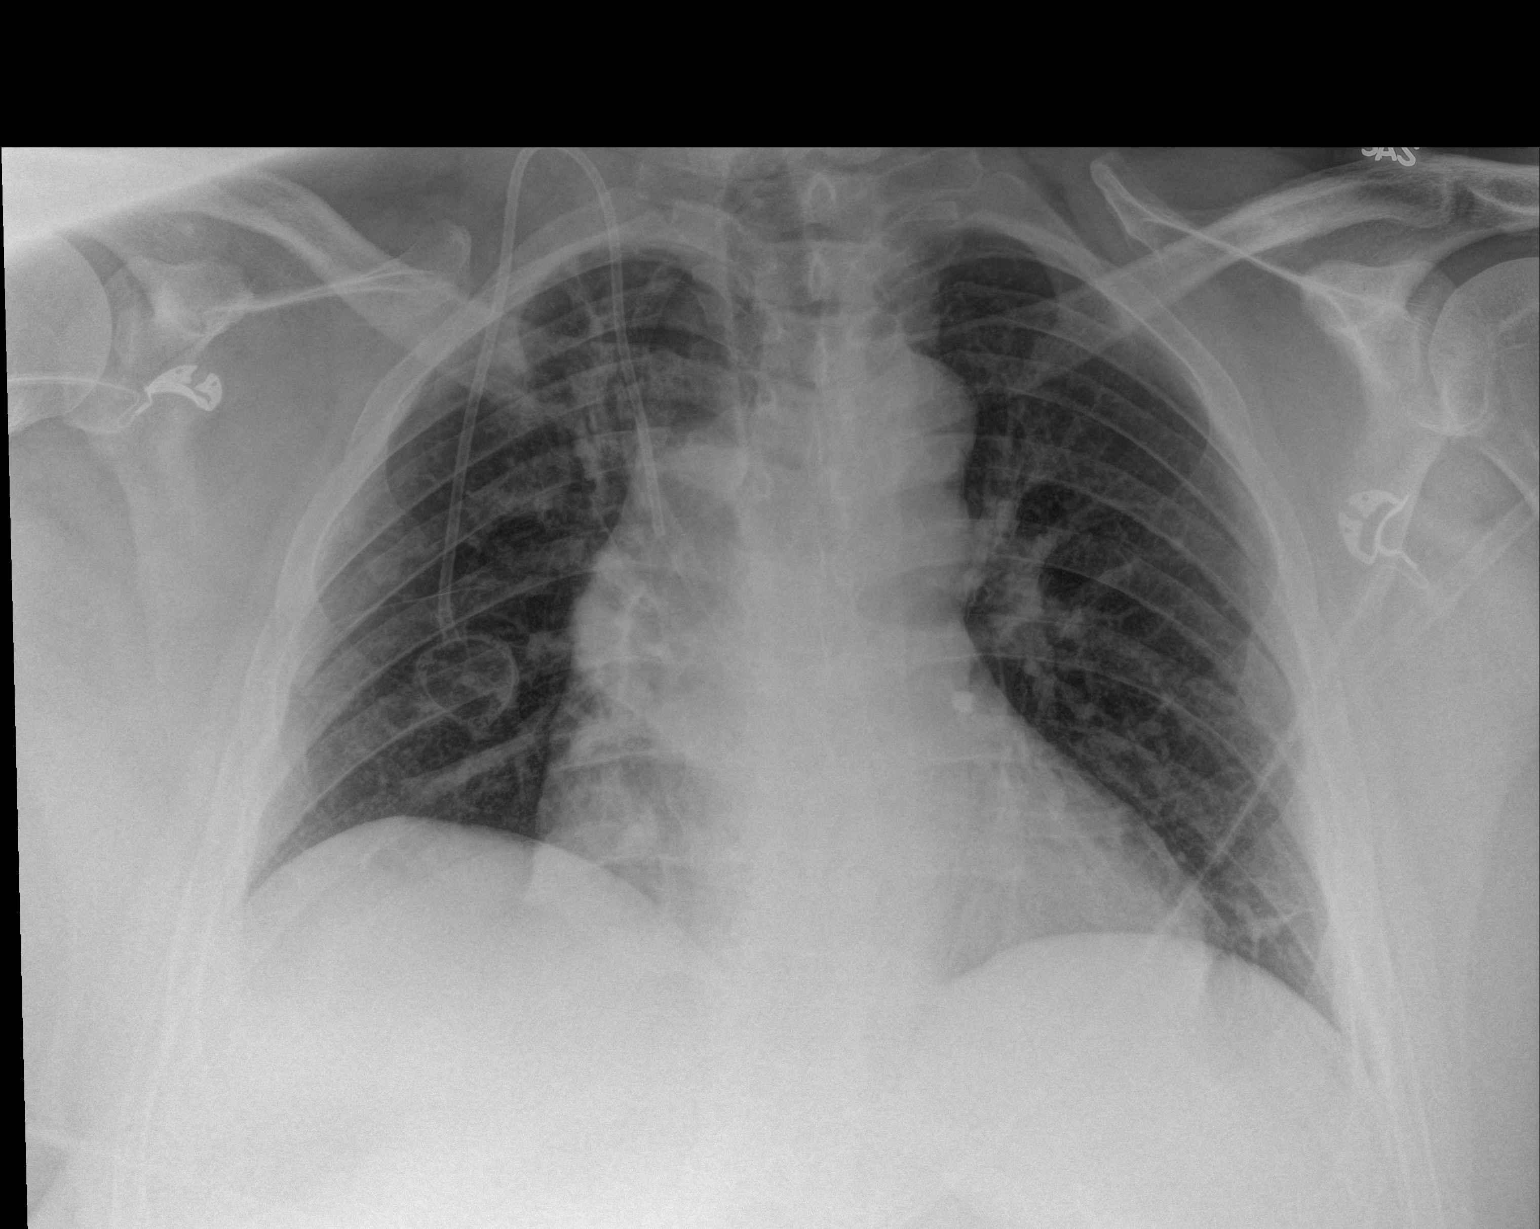

[1 of 1 positions shown; findings below may reference images not displayed]

FINDINGS: Right Port-A-Cath with tip projecting over the superior vena cava in
the region of the azygos arch. No pneumothorax. Mild right upper
lobe interstitial change. Mild cardiac enlargement. Minimal
atelectasis at the left base.
IMPRESSION: Port-A-Cath as described

## 2016-10-01 ENCOUNTER — Ambulatory Visit: Payer: BLUE CROSS/BLUE SHIELD | Admitting: Internal Medicine

## 2016-10-01 ENCOUNTER — Ambulatory Visit: Payer: BLUE CROSS/BLUE SHIELD

## 2016-10-01 ENCOUNTER — Other Ambulatory Visit: Payer: BLUE CROSS/BLUE SHIELD

## 2016-10-01 ENCOUNTER — Telehealth: Payer: Self-pay | Admitting: Internal Medicine

## 2016-10-01 NOTE — Telephone Encounter (Signed)
Spoke tp Dr.Hoxworth; pt's surgeon- he did not feel that patient had any residual bulky metastatic disease; but he does agree with palliative chemotherapy/ and this is incurable.

## 2016-10-04 ENCOUNTER — Telehealth: Payer: Self-pay | Admitting: Internal Medicine

## 2016-10-04 NOTE — Telephone Encounter (Signed)
Called this AM re: PET peer- to peer- case was closed; left message to call me back to discuss the case. Dr.B

## 2016-10-05 ENCOUNTER — Other Ambulatory Visit: Payer: Self-pay | Admitting: Internal Medicine

## 2016-10-05 DIAGNOSIS — C772 Secondary and unspecified malignant neoplasm of intra-abdominal lymph nodes: Principal | ICD-10-CM

## 2016-10-05 DIAGNOSIS — C186 Malignant neoplasm of descending colon: Secondary | ICD-10-CM

## 2016-10-05 NOTE — Progress Notes (Signed)
msg sent to sch. Team re: ct scan

## 2016-10-06 ENCOUNTER — Other Ambulatory Visit: Payer: Self-pay | Admitting: *Deleted

## 2016-10-06 ENCOUNTER — Ambulatory Visit: Payer: BLUE CROSS/BLUE SHIELD

## 2016-10-06 ENCOUNTER — Ambulatory Visit: Payer: BLUE CROSS/BLUE SHIELD | Admitting: Internal Medicine

## 2016-10-06 ENCOUNTER — Other Ambulatory Visit: Payer: BLUE CROSS/BLUE SHIELD

## 2016-10-06 MED ORDER — OXYCODONE HCL 10 MG PO TABS: 10.0000 mg | ORAL_TABLET | ORAL | 0 refills | Status: DC | PRN

## 2016-10-06 NOTE — Telephone Encounter (Signed)
Per Dr B give only enough Oxycodone to last til his appt. Rx signed [place] notified

## 2016-10-06 NOTE — Telephone Encounter (Signed)
Requesting refill on Oxycodone. He got # 60 9 days ago. He is on Fentanyl 50 mcg do you want to make any adjustments?

## 2016-10-08 ENCOUNTER — Other Ambulatory Visit: Payer: BLUE CROSS/BLUE SHIELD

## 2016-10-08 ENCOUNTER — Ambulatory Visit: Payer: BLUE CROSS/BLUE SHIELD | Admitting: Internal Medicine

## 2016-10-08 ENCOUNTER — Ambulatory Visit: Payer: BLUE CROSS/BLUE SHIELD

## 2016-10-11 ENCOUNTER — Ambulatory Visit: Payer: BLUE CROSS/BLUE SHIELD

## 2016-10-12 ENCOUNTER — Ambulatory Visit: Payer: BLUE CROSS/BLUE SHIELD

## 2016-10-12 ENCOUNTER — Other Ambulatory Visit: Payer: Self-pay | Admitting: *Deleted

## 2016-10-12 NOTE — Telephone Encounter (Signed)
Wants to pick up rx tomorrow

## 2016-10-13 ENCOUNTER — Inpatient Hospital Stay: Payer: BLUE CROSS/BLUE SHIELD | Admitting: Internal Medicine

## 2016-10-13 ENCOUNTER — Inpatient Hospital Stay: Payer: BLUE CROSS/BLUE SHIELD

## 2016-10-13 MED ORDER — OXYCODONE HCL 10 MG PO TABS: 10.0000 mg | ORAL_TABLET | ORAL | 0 refills | Status: DC | PRN

## 2016-10-13 MED ORDER — FENTANYL 50 MCG/HR TD PT72: 50.0000 ug | MEDICATED_PATCH | TRANSDERMAL | 0 refills | Status: DC

## 2016-10-14 ENCOUNTER — Encounter
Admission: RE | Admit: 2016-10-14 | Discharge: 2016-10-14 | Disposition: A | Discharge status: Home / Self Care | Payer: BLUE CROSS/BLUE SHIELD | Source: Ambulatory Visit | Attending: Internal Medicine | Admitting: Internal Medicine

## 2016-10-14 DIAGNOSIS — C186 Malignant neoplasm of descending colon: Secondary | ICD-10-CM | POA: Insufficient documentation

## 2016-10-14 LAB — GLUCOSE, CAPILLARY: Glucose-Capillary: 99 mg/dL (ref 65–99)

## 2016-10-14 MED ORDER — FLUDEOXYGLUCOSE F - 18 (FDG) INJECTION
12.2000 | Freq: Once | INTRAVENOUS | Status: AC | PRN
  Administered 2016-10-14: 12.2 via INTRAVENOUS

## 2016-10-18 ENCOUNTER — Inpatient Hospital Stay: Payer: BLUE CROSS/BLUE SHIELD | Attending: Internal Medicine | Admitting: Internal Medicine

## 2016-10-18 ENCOUNTER — Inpatient Hospital Stay: Payer: BLUE CROSS/BLUE SHIELD

## 2016-10-18 VITALS — BP 143/84 | HR 92 | Temp 96.6°F | Wt 280.5 lb

## 2016-10-18 VITALS — BP 122/85 | HR 63 | Temp 96.3°F | Resp 20

## 2016-10-18 DIAGNOSIS — R21 Rash and other nonspecific skin eruption: Secondary | ICD-10-CM | POA: Insufficient documentation

## 2016-10-18 DIAGNOSIS — E669 Obesity, unspecified: Secondary | ICD-10-CM | POA: Insufficient documentation

## 2016-10-18 DIAGNOSIS — C186 Malignant neoplasm of descending colon: Secondary | ICD-10-CM | POA: Diagnosis not present

## 2016-10-18 DIAGNOSIS — K219 Gastro-esophageal reflux disease without esophagitis: Secondary | ICD-10-CM | POA: Insufficient documentation

## 2016-10-18 DIAGNOSIS — Z5111 Encounter for antineoplastic chemotherapy: Secondary | ICD-10-CM | POA: Insufficient documentation

## 2016-10-18 DIAGNOSIS — I10 Essential (primary) hypertension: Secondary | ICD-10-CM | POA: Insufficient documentation

## 2016-10-18 DIAGNOSIS — E876 Hypokalemia: Secondary | ICD-10-CM | POA: Insufficient documentation

## 2016-10-18 DIAGNOSIS — C772 Secondary and unspecified malignant neoplasm of intra-abdominal lymph nodes: Secondary | ICD-10-CM

## 2016-10-18 DIAGNOSIS — Z79899 Other long term (current) drug therapy: Secondary | ICD-10-CM | POA: Insufficient documentation

## 2016-10-18 DIAGNOSIS — C7800 Secondary malignant neoplasm of unspecified lung: Secondary | ICD-10-CM | POA: Insufficient documentation

## 2016-10-18 DIAGNOSIS — C786 Secondary malignant neoplasm of retroperitoneum and peritoneum: Secondary | ICD-10-CM | POA: Insufficient documentation

## 2016-10-18 DIAGNOSIS — Z87442 Personal history of urinary calculi: Secondary | ICD-10-CM | POA: Diagnosis not present

## 2016-10-18 DIAGNOSIS — C787 Secondary malignant neoplasm of liver and intrahepatic bile duct: Secondary | ICD-10-CM | POA: Diagnosis not present

## 2016-10-18 DIAGNOSIS — G893 Neoplasm related pain (acute) (chronic): Secondary | ICD-10-CM | POA: Insufficient documentation

## 2016-10-18 DIAGNOSIS — Z7722 Contact with and (suspected) exposure to environmental tobacco smoke (acute) (chronic): Secondary | ICD-10-CM | POA: Diagnosis not present

## 2016-10-18 DIAGNOSIS — Z85828 Personal history of other malignant neoplasm of skin: Secondary | ICD-10-CM | POA: Diagnosis not present

## 2016-10-18 DIAGNOSIS — D869 Sarcoidosis, unspecified: Secondary | ICD-10-CM | POA: Diagnosis not present

## 2016-10-18 LAB — CBC WITH DIFFERENTIAL/PLATELET
BASOS ABS: 0.1 10*3/uL (ref 0–0.1)
Basophils Relative: 1 %
EOS ABS: 0.1 10*3/uL (ref 0–0.7)
EOS PCT: 1 %
HCT: 36.8 % — ABNORMAL LOW (ref 40.0–52.0)
HEMOGLOBIN: 12.3 g/dL — AB (ref 13.0–18.0)
LYMPHS ABS: 1.2 10*3/uL (ref 1.0–3.6)
LYMPHS PCT: 17 %
MCH: 27.5 pg (ref 26.0–34.0)
MCHC: 33.4 g/dL (ref 32.0–36.0)
MCV: 82.6 fL (ref 80.0–100.0)
Monocytes Absolute: 0.6 10*3/uL (ref 0.2–1.0)
Monocytes Relative: 8 %
NEUTROS PCT: 73 %
Neutro Abs: 5.2 10*3/uL (ref 1.4–6.5)
PLATELETS: 270 10*3/uL (ref 150–440)
RBC: 4.45 MIL/uL (ref 4.40–5.90)
RDW: 16.4 % — ABNORMAL HIGH (ref 11.5–14.5)
WBC: 7.1 10*3/uL (ref 3.8–10.6)

## 2016-10-18 LAB — COMPREHENSIVE METABOLIC PANEL
ALK PHOS: 145 U/L — AB (ref 38–126)
ALT: 46 U/L (ref 17–63)
AST: 54 U/L — AB (ref 15–41)
Albumin: 3.7 g/dL (ref 3.5–5.0)
Anion gap: 7 (ref 5–15)
BUN: 10 mg/dL (ref 6–20)
CALCIUM: 8.7 mg/dL — AB (ref 8.9–10.3)
CHLORIDE: 106 mmol/L (ref 101–111)
CO2: 25 mmol/L (ref 22–32)
Creatinine, Ser: 1.11 mg/dL (ref 0.61–1.24)
GFR calc non Af Amer: >60 mL/min (ref >60)
GLUCOSE: 105 mg/dL — AB (ref 65–99)
Potassium: 3.3 mmol/L — ABNORMAL LOW (ref 3.5–5.1)
SODIUM: 138 mmol/L (ref 135–145)
Total Bilirubin: 0.6 mg/dL (ref 0.3–1.2)
Total Protein: 7.6 g/dL (ref 6.5–8.1)

## 2016-10-18 MED ORDER — PALONOSETRON HCL INJECTION 0.25 MG/5ML
0.2500 mg | Freq: Once | INTRAVENOUS | Status: AC
  Administered 2016-10-18: 0.25 mg via INTRAVENOUS
  Filled 2016-10-18: qty 5

## 2016-10-18 MED ORDER — IRINOTECAN HCL CHEMO INJECTION 100 MG/5ML
180.0000 mg/m2 | Freq: Once | INTRAVENOUS | Status: AC
  Administered 2016-10-18: 460 mg via INTRAVENOUS
  Filled 2016-10-18: qty 15

## 2016-10-18 MED ORDER — HEPARIN SOD (PORK) LOCK FLUSH 100 UNIT/ML IV SOLN: 500.0000 [IU] | Freq: Once | INTRAVENOUS | Status: DC

## 2016-10-18 MED ORDER — DEXAMETHASONE SODIUM PHOSPHATE 10 MG/ML IJ SOLN
10.0000 mg | Freq: Once | INTRAMUSCULAR | Status: AC
  Administered 2016-10-18: 10 mg via INTRAVENOUS
  Filled 2016-10-18: qty 1

## 2016-10-18 MED ORDER — SODIUM CHLORIDE 0.9 % IV SOLN
Freq: Once | INTRAVENOUS | Status: AC
  Administered 2016-10-18: 11:00:00 via INTRAVENOUS
  Filled 2016-10-18: qty 1000

## 2016-10-18 MED ORDER — SODIUM CHLORIDE 0.9 % IV SOLN: 10.0000 mg | Freq: Once | INTRAVENOUS | Status: DC

## 2016-10-18 MED ORDER — MINOCYCLINE HCL 50 MG PO TABS: 50.0000 mg | ORAL_TABLET | Freq: Two times a day (BID) | ORAL | 3 refills | Status: DC

## 2016-10-18 MED ORDER — FLUOROURACIL CHEMO INJECTION 5 GM/100ML
2400.0000 mg/m2 | INTRAVENOUS | Status: DC
  Administered 2016-10-18: 6050 mg via INTRAVENOUS
  Filled 2016-10-18: qty 121

## 2016-10-18 MED ORDER — PROCHLORPERAZINE MALEATE 10 MG PO TABS: 10.0000 mg | ORAL_TABLET | Freq: Once | ORAL | 0 refills | Status: DC

## 2016-10-18 MED ORDER — SODIUM CHLORIDE 0.9% FLUSH
10.0000 mL | INTRAVENOUS | Status: DC | PRN
  Administered 2016-10-18: 10 mL via INTRAVENOUS
  Filled 2016-10-18: qty 10

## 2016-10-18 MED ORDER — FENTANYL 75 MCG/HR TD PT72: 75.0000 ug | MEDICATED_PATCH | TRANSDERMAL | 0 refills | Status: DC

## 2016-10-18 MED ORDER — LEUCOVORIN CALCIUM INJECTION 350 MG
1000.0000 mg | Freq: Once | INTRAMUSCULAR | Status: AC
  Administered 2016-10-18: 1000 mg via INTRAVENOUS
  Filled 2016-10-18: qty 50

## 2016-10-18 MED ORDER — SODIUM CHLORIDE 0.9 % IV SOLN
800.0000 mg | Freq: Once | INTRAVENOUS | Status: AC
  Administered 2016-10-18: 800 mg via INTRAVENOUS
  Filled 2016-10-18: qty 40

## 2016-10-18 MED ORDER — OXYCODONE HCL 10 MG PO TABS: 10.0000 mg | ORAL_TABLET | Freq: Four times a day (QID) | ORAL | 0 refills | Status: DC | PRN

## 2016-10-18 MED ORDER — LORAZEPAM 2 MG/ML IJ SOLN
1.0000 mg | Freq: Once | INTRAMUSCULAR | Status: AC
  Administered 2016-10-18: 1 mg via INTRAVENOUS
  Filled 2016-10-18: qty 1

## 2016-10-18 MED ORDER — FLUOROURACIL CHEMO INJECTION 2.5 GM/50ML
400.0000 mg/m2 | Freq: Once | INTRAVENOUS | Status: AC
Start: 2016-10-18 — End: 2016-10-18
  Administered 2016-10-18: 1000 mg via INTRAVENOUS
  Filled 2016-10-18: qty 20

## 2016-10-18 MED ORDER — ATROPINE SULFATE 1 MG/ML IJ SOLN
0.5000 mg | Freq: Once | INTRAMUSCULAR | Status: AC | PRN
  Administered 2016-10-18: 0.5 mg via INTRAVENOUS
  Filled 2016-10-18: qty 1

## 2016-10-18 MED ORDER — PROCHLORPERAZINE MALEATE 10 MG PO TABS
10.0000 mg | ORAL_TABLET | Freq: Four times a day (QID) | ORAL | Status: DC | PRN
  Administered 2016-10-18: 10 mg via ORAL
  Filled 2016-10-18: qty 1

## 2016-10-18 NOTE — Progress Notes (Signed)
Patient here today for follow up. Patient states no new concerns  

## 2016-10-18 NOTE — Assessment & Plan Note (Addendum)
#  Recommend Colon cancer with metastases- peritoneal/omental metastases-status post small bowel resection; liver metastases. PET shows- multiple liver mets/ omental-peritoneal mets; new lung mets.   # Proceed with cycle #1 of FOLFIRI + vetibex.   # Discussed the potential side effects including but not limited to-increasing fatigue, nausea vomiting, diarrhea, hair loss, sores in the mouth, increase risk of infection and also neuropathy. Rash was also discussed. We'll start the patient on minocycline 50 mg twice a day prophylactic; discussed regarding hydrocortisone cream. We'll prescribe clindamycin gel. Discussed regarding hypomagnesemia.   # Reviewed/counselled regarding the goals of care- being palliative/treatment are usually indefinite-until progression or side effects. Goal is to maintain quality of life as the disease is incurable.   # pain management/chronic pain- worse; increase  fentanyl to 75 g every 72 hours and  oxycodone every 6 hours.  #Follow-up approximately 2 weeks; labs/ chemo.   # # I reviewed the blood work- with the patient in detail; also reviewed the imaging independently [as summarized above]; and with the patient in detail. Also Discussed with Dr.Hoxworth; pt's surgeon.

## 2016-10-18 NOTE — Progress Notes (Signed)
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 Specimen integrity: Intact. Macroscopic tumor perforation: Not identified. Invasive tumor: Maximum size: 5.5 cm. Histologic type(s): Invasive adenocarcinoma. Histologic grade and differentiation: G1: well differentiated/low grade  Pathologic Staging: pT4b, pN1b, pM1b . 2.Patient was started on FOLFOX in September of 2016,after 2 cycles of chemotherapy patient developed neuropathy. Patient had a previous neuropathy as a baseline which increased so was switched over to FOLFIRI I IN December of 2016 As patient has clear os wild-type we can proceed to add either a Avastin or cetuximab from the next chemotherapy.(November, 2016)  Avastin was added as abdominal wound has completely healed now Avastin on hold (December, 2016) because of hypertension Chemotherapy was discontinued after protocol 8 cycle because of significant side effect (last chemotherapy was on October 28, 2015).  # DEC 2017- small bowel obstruction s/p resection [Dr.Hoxworth]- positive for recurrence. 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 ? 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  male  with above history of recurrent metastatic colon cancer to the liver/omentum is here to review the results of his PET scan; and proceed with chemotherapy today.   He continues to have chronic pain "" all over". Continues to be on fentanyl patch 50 g  and oxycodone.  however feels his pain is not well controlled. Continues to have issues with anxiety. Continues to have chronic tingling and numbness of his extremity is.  Complains of poor appetite. No blood in stools or black stools.  REVIEW OF SYSTEMS:  A complete 10 point review of system is done which is negative except mentioned above/history of present illness.   PAST MEDICAL HISTORY :  Past Medical History:  Diagnosis Date  . Abdominal pain    mid abdomen  . Adiposity 03/21/2015  . Arthritis   . Besnier-Boeck disease ([place]) 12/21/2012  . Colon cancer ([place])   . Disorder of peripheral nervous system ([place]) 05/28/2014  . Extreme obesity ([place]) 12/21/2012  . GERD (gastroesophageal reflux disease)   . History of kidney stones   . History of transfusion   . Hypertension    has been off BP med x 6 months  . Iron deficiency anemia due to chronic blood loss 05/08/2015  . LBP (low back pain) 05/28/2014  .  Neuropathy ([place])   . PONV (postoperative nausea and vomiting)   . Sarcoid ([place]) 10/2012  . Skin cancer    "it flairs up with sarcoidosis"    PAST SURGICAL HISTORY :   Past Surgical History:  Procedure  Laterality Date  . CARDIAC CATHETERIZATION    . FLEXIBLE SIGMOIDOSCOPY N/A 05/07/2015   Procedure: FLEXIBLE SIGMOIDOSCOPY;  Surgeon: Laurence Spates, MD;  Location: [facility];  Service: Endoscopy;  Laterality: N/A;  . LAPAROSCOPIC PARTIAL COLECTOMY N/A 05/07/2015   Procedure: LAPAROSCOPIC ASSITED PARTIAL COLECTOMY WITH COLOSTOMY, SMALL BOWEL RESECTION, EXCISION OF PERITONEAL NODULE;  Surgeon: [name], MD;  Location: [facility];  Service: General;  Laterality: N/A;  . LAPAROTOMY N/A 08/10/2016   Procedure: EXPLORATORY LAPAROTOMY small bowel resection abdominal wall resection partial omentectomy;  Surgeon: Excell Seltzer, MD;  Location: [facility];  Service: General;  Laterality: N/A;  . PORTACATH PLACEMENT Right 05/27/2015   Procedure: INSERTION PORT-A-CATH;  Surgeon: [name], MD;  Location: [facility];  Service: General;  Laterality: Right;    FAMILY HISTORY :   Family History  Problem Relation Age of Onset  . Arthritis Mother   . Heart disease Mother   . Stroke Father   . Prostate cancer Neg Hx   . Bladder Cancer Neg Hx     SOCIAL HISTORY:   Social History  Substance Use Topics  . Smoking status: Passive Smoke Exposure - Never Smoker  . Smokeless tobacco: Never Used  . Alcohol use No    ALLERGIES:  is allergic to versed [midazolam]; gabapentin; and paba derivatives.  MEDICATIONS:  Current Outpatient Prescriptions  Medication Sig Dispense Refill  . fentaNYL (DURAGESIC - DOSED MCG/HR) 75 MCG/HR Place 1 patch (75 mcg total) onto the skin every 3 (three) days. 10 patch 0  . lisinopril (PRINIVIL,ZESTRIL) 10 MG tablet Take 10 mg by mouth daily.    Marland Kitchen LORazepam (ATIVAN) 0.5 MG tablet Take 1 tablet (0.5 mg total) by mouth 2 (two) times daily as needed for anxiety. 60 tablet 0  . Oxycodone HCl 10 MG TABS Take 1 tablet (10 mg total) by mouth every 6 (six) hours as needed (severe pain). 60 tablet 0  . promethazine (PHENERGAN) 25 MG tablet Take 1 tablet (25 mg total) by mouth every 6  (six) hours as needed for nausea or vomiting. 30 tablet 1  . zolpidem (AMBIEN) 10 MG tablet Take 10-15 mg by mouth at bedtime as needed for sleep.    . minocycline (DYNACIN) 50 MG tablet Take 1 tablet (50 mg total) by mouth 2 (two) times daily. 60 tablet 3   No current facility-administered medications for this visit.    Facility-Administered Medications Ordered in Other Visits  Medication Dose Route Frequency Provider Last Rate Last Dose  . sodium chloride flush (NS) 0.9 % injection 10 mL  10 mL Intravenous PRN Forest Gleason, MD   10 mL at 02/11/16 1326    PHYSICAL EXAMINATION: ECOG PERFORMANCE STATUS: 0 - Asymptomatic  BP (!) 143/84 (BP Location: Right Arm, Patient Position: Sitting)   Pulse 92   Temp (!) 96.6 F (35.9 C) (Tympanic)   Wt 280 lb 8 oz (127.2 kg)   BMI 39.12 kg/m   Filed Weights   10/18/16 0911  Weight: 280 lb 8 oz (127.2 kg)    GENERAL: Well-nourished well-developed; Alert, no distress and comfortable.  Obese. Accompanied by is wife.  EYES: no pallor or icterus OROPHARYNX: no thrush or ulceration; good dentition  NECK: supple, no masses felt LYMPH:  no  palpable lymphadenopathy in the cervical, axillary or inguinal regions LUNGS: clear to auscultation and  No wheeze or crackles HEART/CVS: regular rate & rhythm and no murmurs; No lower extremity edema ABDOMEN:abdomen soft, non-tender and normal bowel sounds; Positive for colostomy. Incisions were healing. Musculoskeletal:no cyanosis of digits and no clubbing  PSYCH: alert & oriented x 3 with fluent speech; anxious/ depressed/tearful NEURO: no focal motor/sensory deficits SKIN:  no rashes or significant lesions  LABORATORY DATA:  I have reviewed the data as listed    Component Value Date/Time   NA 138 10/18/2016 0844   NA 138 01/02/2015 1555   K 3.3 (L) 10/18/2016 0844   K 3.7 01/02/2015 1555   CL 106 10/18/2016 0844   CL 109 01/02/2015 1555   CO2 25 10/18/2016 0844   CO2 22 01/02/2015 1555   GLUCOSE  105 (H) 10/18/2016 0844   GLUCOSE 110 (H) 01/02/2015 1555   BUN 10 10/18/2016 0844   BUN 9 01/02/2015 1555   CREATININE 1.11 10/18/2016 0844   CREATININE 1.04 01/02/2015 1555   CALCIUM 8.7 (L) 10/18/2016 0844   CALCIUM 8.8 (L) 01/02/2015 1555   PROT 7.6 10/18/2016 0844   PROT 8.2 (H) 01/02/2015 1555   ALBUMIN 3.7 10/18/2016 0844   ALBUMIN 4.0 01/02/2015 1555   AST 54 (H) 10/18/2016 0844   AST 39 01/02/2015 1555   ALT 46 10/18/2016 0844   ALT 55 01/02/2015 1555   ALKPHOS 145 (H) 10/18/2016 0844   ALKPHOS 125 01/02/2015 1555   BILITOT 0.6 10/18/2016 0844   BILITOT 0.5 01/02/2015 1555   GFRNONAA >60 10/18/2016 0844   GFRNONAA >60 01/02/2015 1555   GFRAA >60 10/18/2016 0844   GFRAA >60 01/02/2015 1555    No results found for: SPEP, UPEP  Lab Results  Component Value Date   WBC 7.1 10/18/2016   NEUTROABS 5.2 10/18/2016   HGB 12.3 (L) 10/18/2016   HCT 36.8 (L) 10/18/2016   MCV 82.6 10/18/2016   PLT 270 10/18/2016      Chemistry      Component Value Date/Time   NA 138 10/18/2016 0844   NA 138 01/02/2015 1555   K 3.3 (L) 10/18/2016 0844   K 3.7 01/02/2015 1555   CL 106 10/18/2016 0844   CL 109 01/02/2015 1555   CO2 25 10/18/2016 0844   CO2 22 01/02/2015 1555   BUN 10 10/18/2016 0844   BUN 9 01/02/2015 1555   CREATININE 1.11 10/18/2016 0844   CREATININE 1.04 01/02/2015 1555      Component Value Date/Time   CALCIUM 8.7 (L) 10/18/2016 0844   CALCIUM 8.8 (L) 01/02/2015 1555   ALKPHOS 145 (H) 10/18/2016 0844   ALKPHOS 125 01/02/2015 1555   AST 54 (H) 10/18/2016 0844   AST 39 01/02/2015 1555   ALT 46 10/18/2016 0844   ALT 55 01/02/2015 1555   BILITOT 0.6 10/18/2016 0844   BILITOT 0.5 01/02/2015 1555     IMPRESSION: 1. Unfortunately there is new metastatic disease to the right lung, extensive metastatic disease to the liver, and metastatic disease to the omentum, peritoneal margin, and mesentery. 2. Other imaging findings of potential clinical  significance: Coronary atherosclerosis. 3. Bilateral nonobstructive nephrolithiasis.   Electronically Signed   By: Van Clines M.D.   On: 10/14/2016 13:33  RADIOGRAPHIC STUDIES: I have personally reviewed the radiological images as listed and agreed with the findings in the report. No results found.   ASSESSMENT & PLAN:  Cancer of descending colon metastatic to intra-abdominal lymph  node  #Recommend Colon cancer with metastases- peritoneal/omental metastases-status post small bowel resection; liver metastases. PET shows- multiple liver mets/ omental-peritoneal mets; new lung mets.   # Proceed with cycle #1 of FOLFIRI + vetibex.   # Discussed the potential side effects including but not limited to-increasing fatigue, nausea vomiting, diarrhea, hair loss, sores in the mouth, increase risk of infection and also neuropathy. Rash was also discussed. We'll start the patient on minocycline 50 mg twice a day prophylactic; discussed regarding hydrocortisone cream. We'll prescribe clindamycin gel. Discussed regarding hypomagnesemia.   # Reviewed/counselled regarding the goals of care- being palliative/treatment are usually indefinite-until progression or side effects. Goal is to maintain quality of life as the disease is incurable.   # pain management/chronic pain- worse; increase  fentanyl to 75 g every 72 hours and  oxycodone every 6 hours.  #Follow-up approximately 2 weeks; labs/ chemo.   # # I reviewed the blood work- with the patient in detail; also reviewed the imaging independently [as summarized above]; and with the patient in detail. Also Discussed with Dr.Hoxworth; pt's surgeon.    No orders of the defined types were placed in this encounter.      Cammie Sickle, MD 10/19/2016 8:40 AM

## 2016-10-18 NOTE — Progress Notes (Signed)
13:15 - patient complains of nausea, cramping in right hand (visible).  Stopped chemo, increased NS, took vitals (normal, see flowsheet) while calling [name].  [name] ordered Compazine 10 mg for nausea.  Per [name] continue with chemo.  LJ  13:45 - patient states nausea getting worse, feeling some numbness in his face.  Cramping in right hand has resolved.  Called [name] who ordered Ativan 1 mg for patient.  LJ  14:10 - Patient received Ativan 1 mg at 13:55 and at this time patient states nausea improved.  Restarted chemo, per [name].  LJ

## 2016-10-19 ENCOUNTER — Other Ambulatory Visit: Payer: Self-pay | Admitting: *Deleted

## 2016-10-19 LAB — CEA: CEA: 119.9 ng/mL — ABNORMAL HIGH (ref 0.0–4.7)

## 2016-10-19 MED ORDER — LIDOCAINE-PRILOCAINE 2.5-2.5 % EX CREA: 1.0000 "application " | TOPICAL_CREAM | CUTANEOUS | 1 refills | Status: DC | PRN

## 2016-10-19 MED ORDER — POTASSIUM CHLORIDE CRYS ER 20 MEQ PO TBCR: 20.0000 meq | EXTENDED_RELEASE_TABLET | Freq: Two times a day (BID) | ORAL | 3 refills | Status: DC

## 2016-10-20 ENCOUNTER — Inpatient Hospital Stay: Payer: BLUE CROSS/BLUE SHIELD

## 2016-10-20 VITALS — BP 101/73 | HR 86 | Temp 97.2°F | Resp 20

## 2016-10-20 DIAGNOSIS — C772 Secondary and unspecified malignant neoplasm of intra-abdominal lymph nodes: Principal | ICD-10-CM

## 2016-10-20 DIAGNOSIS — C186 Malignant neoplasm of descending colon: Secondary | ICD-10-CM

## 2016-10-20 MED ORDER — SODIUM CHLORIDE 0.9% FLUSH
10.0000 mL | INTRAVENOUS | Status: DC | PRN
  Administered 2016-10-20: 10 mL
  Filled 2016-10-20: qty 10

## 2016-10-20 MED ORDER — HEPARIN SOD (PORK) LOCK FLUSH 100 UNIT/ML IV SOLN
500.0000 [IU] | Freq: Once | INTRAVENOUS | Status: AC | PRN
  Administered 2016-10-20: 500 [IU]
  Filled 2016-10-20: qty 5

## 2016-10-29 ENCOUNTER — Other Ambulatory Visit: Payer: Self-pay | Admitting: *Deleted

## 2016-10-29 ENCOUNTER — Encounter: Payer: Self-pay | Admitting: Internal Medicine

## 2016-10-29 DIAGNOSIS — C186 Malignant neoplasm of descending colon: Secondary | ICD-10-CM

## 2016-10-29 DIAGNOSIS — C772 Secondary and unspecified malignant neoplasm of intra-abdominal lymph nodes: Principal | ICD-10-CM

## 2016-11-01 ENCOUNTER — Inpatient Hospital Stay: Payer: BLUE CROSS/BLUE SHIELD

## 2016-11-01 ENCOUNTER — Other Ambulatory Visit: Payer: Self-pay | Admitting: *Deleted

## 2016-11-01 ENCOUNTER — Inpatient Hospital Stay (HOSPITAL_BASED_OUTPATIENT_CLINIC_OR_DEPARTMENT_OTHER): Payer: BLUE CROSS/BLUE SHIELD | Admitting: Internal Medicine

## 2016-11-01 VITALS — BP 114/90 | HR 120 | Temp 95.5°F | Resp 22 | Ht 71.0 in | Wt 269.0 lb

## 2016-11-01 VITALS — BP 125/85 | HR 77 | Resp 20

## 2016-11-01 DIAGNOSIS — Z79899 Other long term (current) drug therapy: Secondary | ICD-10-CM

## 2016-11-01 DIAGNOSIS — C7800 Secondary malignant neoplasm of unspecified lung: Secondary | ICD-10-CM

## 2016-11-01 DIAGNOSIS — C772 Secondary and unspecified malignant neoplasm of intra-abdominal lymph nodes: Principal | ICD-10-CM

## 2016-11-01 DIAGNOSIS — G893 Neoplasm related pain (acute) (chronic): Secondary | ICD-10-CM

## 2016-11-01 DIAGNOSIS — R112 Nausea with vomiting, unspecified: Secondary | ICD-10-CM

## 2016-11-01 DIAGNOSIS — R634 Abnormal weight loss: Secondary | ICD-10-CM

## 2016-11-01 DIAGNOSIS — K521 Toxic gastroenteritis and colitis: Secondary | ICD-10-CM

## 2016-11-01 DIAGNOSIS — R21 Rash and other nonspecific skin eruption: Secondary | ICD-10-CM

## 2016-11-01 DIAGNOSIS — C186 Malignant neoplasm of descending colon: Secondary | ICD-10-CM

## 2016-11-01 DIAGNOSIS — C787 Secondary malignant neoplasm of liver and intrahepatic bile duct: Secondary | ICD-10-CM | POA: Diagnosis not present

## 2016-11-01 DIAGNOSIS — T451X5A Adverse effect of antineoplastic and immunosuppressive drugs, initial encounter: Secondary | ICD-10-CM

## 2016-11-01 DIAGNOSIS — E860 Dehydration: Secondary | ICD-10-CM | POA: Insufficient documentation

## 2016-11-01 DIAGNOSIS — E876 Hypokalemia: Secondary | ICD-10-CM

## 2016-11-01 DIAGNOSIS — C786 Secondary malignant neoplasm of retroperitoneum and peritoneum: Secondary | ICD-10-CM | POA: Diagnosis not present

## 2016-11-01 DIAGNOSIS — R630 Anorexia: Secondary | ICD-10-CM

## 2016-11-01 LAB — COMPREHENSIVE METABOLIC PANEL
ALK PHOS: 116 U/L (ref 38–126)
ALT: 25 U/L (ref 17–63)
AST: 26 U/L (ref 15–41)
Albumin: 3.5 g/dL (ref 3.5–5.0)
Anion gap: 12 (ref 5–15)
BILIRUBIN TOTAL: 0.8 mg/dL (ref 0.3–1.2)
BUN: 8 mg/dL (ref 6–20)
CALCIUM: 8.4 mg/dL — AB (ref 8.9–10.3)
CO2: 23 mmol/L (ref 22–32)
CREATININE: 1.03 mg/dL (ref 0.61–1.24)
Chloride: 100 mmol/L — ABNORMAL LOW (ref 101–111)
GFR calc Af Amer: >60 mL/min (ref >60)
Glucose, Bld: 132 mg/dL — ABNORMAL HIGH (ref 65–99)
POTASSIUM: 2.6 mmol/L — AB (ref 3.5–5.1)
Sodium: 135 mmol/L (ref 135–145)
TOTAL PROTEIN: 7.4 g/dL (ref 6.5–8.1)

## 2016-11-01 LAB — CBC WITH DIFFERENTIAL/PLATELET
BASOS ABS: 0 10*3/uL (ref 0–0.1)
Basophils Relative: 0 %
Eosinophils Absolute: 0.1 10*3/uL (ref 0–0.7)
Eosinophils Relative: 2 %
HEMATOCRIT: 36.3 % — AB (ref 40.0–52.0)
Hemoglobin: 12.4 g/dL — ABNORMAL LOW (ref 13.0–18.0)
LYMPHS PCT: 37 %
Lymphs Abs: 1.3 10*3/uL (ref 1.0–3.6)
MCH: 27.5 pg (ref 26.0–34.0)
MCHC: 34.2 g/dL (ref 32.0–36.0)
MCV: 80.4 fL (ref 80.0–100.0)
MONO ABS: 0.6 10*3/uL (ref 0.2–1.0)
Monocytes Relative: 18 %
Neutro Abs: 1.5 10*3/uL (ref 1.4–6.5)
Neutrophils Relative %: 43 %
Platelets: 291 10*3/uL (ref 150–440)
RBC: 4.51 MIL/uL (ref 4.40–5.90)
RDW: 15.9 % — AB (ref 11.5–14.5)
WBC: 3.6 10*3/uL — ABNORMAL LOW (ref 3.8–10.6)

## 2016-11-01 MED ORDER — CLINDAMYCIN PHOSPHATE 1 % EX GEL: Freq: Every day | CUTANEOUS | 0 refills | Status: DC

## 2016-11-01 MED ORDER — SODIUM CHLORIDE 0.9 % IV SOLN
Freq: Once | INTRAVENOUS | Status: AC
  Administered 2016-11-01: 10:00:00 via INTRAVENOUS
  Filled 2016-11-01: qty 1000

## 2016-11-01 MED ORDER — LORAZEPAM 2 MG/ML IJ SOLN
1.0000 mg | Freq: Once | INTRAMUSCULAR | Status: AC
  Administered 2016-11-01: 1 mg via INTRAVENOUS
  Filled 2016-11-01: qty 1

## 2016-11-01 MED ORDER — HEPARIN SOD (PORK) LOCK FLUSH 100 UNIT/ML IV SOLN
500.0000 [IU] | Freq: Once | INTRAVENOUS | Status: AC
  Administered 2016-11-01: 500 [IU] via INTRAVENOUS
  Filled 2016-11-01: qty 5

## 2016-11-01 MED ORDER — OXYCODONE HCL 10 MG PO TABS: 10.0000 mg | ORAL_TABLET | Freq: Four times a day (QID) | ORAL | 0 refills | Status: DC | PRN

## 2016-11-01 MED ORDER — SODIUM CHLORIDE 0.9 % IV SOLN
Freq: Once | INTRAVENOUS | Status: AC
  Administered 2016-11-01: 11:00:00 via INTRAVENOUS
  Filled 2016-11-01: qty 20

## 2016-11-01 MED ORDER — SODIUM CHLORIDE 0.9 % IV SOLN
Freq: Once | INTRAVENOUS | Status: AC
  Administered 2016-11-01: 11:00:00 via INTRAVENOUS
  Filled 2016-11-01: qty 4

## 2016-11-01 MED ORDER — DIPHENOXYLATE-ATROPINE 2.5-0.025 MG PO TABS: 1.0000 | ORAL_TABLET | Freq: Four times a day (QID) | ORAL | 3 refills | Status: DC | PRN

## 2016-11-01 MED ORDER — SODIUM CHLORIDE 0.9% FLUSH
10.0000 mL | INTRAVENOUS | Status: DC | PRN
  Administered 2016-11-01: 10 mL via INTRAVENOUS
  Filled 2016-11-01: qty 10

## 2016-11-01 MED ORDER — ONDANSETRON HCL 8 MG PO TABS: 8.0000 mg | ORAL_TABLET | Freq: Three times a day (TID) | ORAL | 3 refills | Status: DC | PRN

## 2016-11-01 NOTE — Progress Notes (Signed)
1022-Critical potassium called by Collene Mares in [facility] lab. Potassium 2.6. Read back process performed.  Dr. Bradd Canary informed at 1025 am-read back process performed with md.

## 2016-11-01 NOTE — Progress Notes (Signed)
1315-Pt C/O nausea returning.  MD aware.  Ativan ordered. 1348- Pt states nausea completely gone and feeling much better.

## 2016-11-01 NOTE — Progress Notes (Signed)
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 Specimen integrity: Intact. Macroscopic tumor perforation: Not identified. Invasive tumor: Maximum size: 5.5 cm. Histologic type(s): Invasive adenocarcinoma. Histologic grade and differentiation: G1: well differentiated/low grade  Pathologic Staging: pT4b, pN1b, pM1b . 2.Patient was started on FOLFOX in September of 2016,after 2 cycles of chemotherapy patient developed neuropathy. Patient had a previous neuropathy as a baseline which increased so was switched over to FOLFIRI I IN December of 2016 As patient has clear os wild-type we can proceed to add either a Avastin or cetuximab from the next chemotherapy.(November, 2016)  Avastin was added as abdominal wound has completely healed now Avastin on hold (December, 2016) because of hypertension Chemotherapy was discontinued after protocol 8 cycle because of significant side effect (last chemotherapy was on October 28, 2015).  # DEC 2017- small bowel obstruction s/p resection [Dr.Hoxworth]- positive for recurrence. 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 ? 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  male  with above history of recurrent metastatic colon cancer to the liver/omentum on palliative chemo- with FOLFIRI + vectibex Cycle 1 approximately 2 weeks ago.  Patient complains of significant loose stools in the colostomy bag. He has been taking Imodium without any significant improvement. He complains of "episodes of sweating"; without chills; chronic. No fevers. He feels extremely tired. Her to have epigastric discomfort; especially on minocycline. Complains of a rash on his face and his back. Denies any significant pain or itching.  Patient complains of significant nausea. Multiple episodes of vomiting. He is currently feeling nauseous. Feels dizzy on standing up.  REVIEW OF SYSTEMS:  A complete 10 point review of system is done which is negative except mentioned above/history of present illness.   PAST MEDICAL HISTORY :  Past Medical History:  Diagnosis Date  . Abdominal pain    mid abdomen  . Adiposity 03/21/2015  . Arthritis   . Besnier-Boeck disease ([place]) 12/21/2012  . Colon cancer ([place])   . Disorder of peripheral nervous system ([place]) 05/28/2014  . Extreme obesity ([place]) 12/21/2012  . GERD (gastroesophageal reflux disease)   . History of kidney stones   . History of transfusion   . Hypertension    has been off BP med x 6 months  .  Iron deficiency anemia due to chronic blood loss 05/08/2015  . LBP (low back pain) 05/28/2014  . Neuropathy ([place])   .  PONV (postoperative nausea and vomiting)   . Sarcoid ([place]) 10/2012  . Skin cancer    "it flairs up with sarcoidosis"    PAST SURGICAL HISTORY :   Past Surgical History:  Procedure Laterality Date  . CARDIAC CATHETERIZATION    . FLEXIBLE SIGMOIDOSCOPY N/A 05/07/2015   Procedure: FLEXIBLE SIGMOIDOSCOPY;  Surgeon: Laurence Spates, MD;  Location: [facility];  Service: Endoscopy;  Laterality: N/A;  . LAPAROSCOPIC PARTIAL COLECTOMY N/A 05/07/2015   Procedure: LAPAROSCOPIC ASSITED PARTIAL COLECTOMY WITH COLOSTOMY, SMALL BOWEL RESECTION, EXCISION OF PERITONEAL NODULE;  Surgeon: [name], MD;  Location: [facility];  Service: General;  Laterality: N/A;  . LAPAROTOMY N/A 08/10/2016   Procedure: EXPLORATORY LAPAROTOMY small bowel resection abdominal wall resection partial omentectomy;  Surgeon: Excell Seltzer, MD;  Location: [facility];  Service: General;  Laterality: N/A;  . PORTACATH PLACEMENT Right 05/27/2015   Procedure: INSERTION PORT-A-CATH;  Surgeon: [name], MD;  Location: [facility];  Service: General;  Laterality: Right;    FAMILY HISTORY :   Family History  Problem Relation Age of Onset  . Arthritis Mother   . Heart disease Mother   . Stroke Father   . Prostate cancer Neg Hx   . Bladder Cancer Neg Hx     SOCIAL HISTORY:   Social History  Substance Use Topics  . Smoking status: Passive Smoke Exposure - Never Smoker  . Smokeless tobacco: Never Used  . Alcohol use No    ALLERGIES:  is allergic to versed [midazolam]; gabapentin; and paba derivatives.  MEDICATIONS:  Current Outpatient Prescriptions  Medication Sig Dispense Refill  . fentaNYL (DURAGESIC - DOSED MCG/HR) 75 MCG/HR Place 1 patch (75 mcg total) onto the skin every 3 (three) days. 10 patch 0  . lidocaine-prilocaine (EMLA) cream Apply 1 application topically as needed. Apply small amount to port site at least 1 hour prior to it being accessed, cover with plastic wrap 30 g 1  . lisinopril (PRINIVIL,ZESTRIL) 10 MG  tablet Take 10 mg by mouth daily.    Marland Kitchen loperamide (IMODIUM A-D) 2 MG tablet Take 2 mg by mouth 4 (four) times daily as needed for diarrhea or loose stools.    . Oxycodone HCl 10 MG TABS Take 1 tablet (10 mg total) by mouth every 6 (six) hours as needed (severe pain). 60 tablet 0  . promethazine (PHENERGAN) 25 MG tablet Take 1 tablet (25 mg total) by mouth every 6 (six) hours as needed for nausea or vomiting. 30 tablet 1  . zolpidem (AMBIEN) 10 MG tablet Take 10-15 mg by mouth at bedtime as needed for sleep.    . clindamycin (CLINDAGEL) 1 % gel Apply topically daily. Apply to affected areas once a day. 30 g 0  . diphenoxylate-atropine (LOMOTIL) 2.5-0.025 MG tablet Take 1 tablet by mouth 4 (four) times daily as needed for diarrhea or loose stools. 30 tablet 3  . LORazepam (ATIVAN) 0.5 MG tablet Take 1 tablet (0.5 mg total) by mouth 2 (two) times daily as needed for anxiety. (Patient not taking: Reported on 11/01/2016) 60 tablet 0  . minocycline (DYNACIN) 50 MG tablet Take 1 tablet (50 mg total) by mouth 2 (two) times daily. (Patient not taking: Reported on 11/01/2016) 60 tablet 3  . ondansetron (ZOFRAN) 8 MG tablet Take 1 tablet (8 mg total) by mouth every 8 (eight) hours as needed for nausea  or vomiting. 30 tablet 3  . potassium chloride SA (K-DUR,KLOR-CON) 20 MEQ tablet Take 1 tablet (20 mEq total) by mouth 2 (two) times daily. (Patient not taking: Reported on 11/01/2016) 60 tablet 3   No current facility-administered medications for this visit.    Facility-Administered Medications Ordered in Other Visits  Medication Dose Route Frequency Provider Last Rate Last Dose  . sodium chloride flush (NS) 0.9 % injection 10 mL  10 mL Intravenous PRN Forest Gleason, MD   10 mL at 02/11/16 1326    PHYSICAL EXAMINATION: ECOG PERFORMANCE STATUS: 0 - Asymptomatic  BP 114/90 (BP Location: Left Arm, Patient Position: Sitting)   Pulse (!) 120   Temp (!) 95.5 F (35.3 C) (Tympanic)   Resp (!) 22   Ht 5' 11"   (1.803 m)   Wt 269 lb (122 kg)   BMI 37.52 kg/m   Filed Weights   11/01/16 0910  Weight: 269 lb (122 kg)    GENERAL: Obese Caucasian male patient. Alert, no distress and comfortable.  Obese. Accompanied by is wife. He is in a wheelchair. EYES: no pallor or icterus OROPHARYNX: no thrush or ulceration; good dentition  NECK: supple, no masses felt LYMPH:  no palpable lymphadenopathy in the cervical, axillary or inguinal regions LUNGS: clear to auscultation and  No wheeze or crackles HEART/CVS: regular rate & rhythm and no murmurs; No lower extremity edema ABDOMEN:abdomen soft, non-tender and normal bowel sounds; Positive for colostomy. Incisions were healing. Musculoskeletal:no cyanosis of digits and no clubbing  PSYCH: alert & oriented x 3 with fluent speech; anxious/ depressed/tearful NEURO: no focal motor/sensory deficits SKIN:  no rashes or significant lesions  LABORATORY DATA:  I have reviewed the data as listed    Component Value Date/Time   NA 135 11/01/2016 0845   NA 138 01/02/2015 1555   K 2.6 (LL) 11/01/2016 0845   K 3.7 01/02/2015 1555   CL 100 (L) 11/01/2016 0845   CL 109 01/02/2015 1555   CO2 23 11/01/2016 0845   CO2 22 01/02/2015 1555   GLUCOSE 132 (H) 11/01/2016 0845   GLUCOSE 110 (H) 01/02/2015 1555   BUN 8 11/01/2016 0845   BUN 9 01/02/2015 1555   CREATININE 1.03 11/01/2016 0845   CREATININE 1.04 01/02/2015 1555   CALCIUM 8.4 (L) 11/01/2016 0845   CALCIUM 8.8 (L) 01/02/2015 1555   PROT 7.4 11/01/2016 0845   PROT 8.2 (H) 01/02/2015 1555   ALBUMIN 3.5 11/01/2016 0845   ALBUMIN 4.0 01/02/2015 1555   AST 26 11/01/2016 0845   AST 39 01/02/2015 1555   ALT 25 11/01/2016 0845   ALT 55 01/02/2015 1555   ALKPHOS 116 11/01/2016 0845   ALKPHOS 125 01/02/2015 1555   BILITOT 0.8 11/01/2016 0845   BILITOT 0.5 01/02/2015 1555   GFRNONAA >60 11/01/2016 0845   GFRNONAA >60 01/02/2015 1555   GFRAA >60 11/01/2016 0845   GFRAA >60 01/02/2015 1555    No results  found for: SPEP, UPEP  Lab Results  Component Value Date   WBC 3.6 (L) 11/01/2016   NEUTROABS 1.5 11/01/2016   HGB 12.4 (L) 11/01/2016   HCT 36.3 (L) 11/01/2016   MCV 80.4 11/01/2016   PLT 291 11/01/2016      Chemistry      Component Value Date/Time   NA 135 11/01/2016 0845   NA 138 01/02/2015 1555   K 2.6 (LL) 11/01/2016 0845   K 3.7 01/02/2015 1555   CL 100 (L) 11/01/2016 0845   CL 109 01/02/2015  1555   CO2 23 11/01/2016 0845   CO2 22 01/02/2015 1555   BUN 8 11/01/2016 0845   BUN 9 01/02/2015 1555   CREATININE 1.03 11/01/2016 0845   CREATININE 1.04 01/02/2015 1555      Component Value Date/Time   CALCIUM 8.4 (L) 11/01/2016 0845   CALCIUM 8.8 (L) 01/02/2015 1555   ALKPHOS 116 11/01/2016 0845   ALKPHOS 125 01/02/2015 1555   AST 26 11/01/2016 0845   AST 39 01/02/2015 1555   ALT 25 11/01/2016 0845   ALT 55 01/02/2015 1555   BILITOT 0.8 11/01/2016 0845   BILITOT 0.5 01/02/2015 1555     IMPRESSION: 1. Unfortunately there is new metastatic disease to the right lung, extensive metastatic disease to the liver, and metastatic disease to the omentum, peritoneal margin, and mesentery. 2. Other imaging findings of potential clinical significance: Coronary atherosclerosis. 3. Bilateral nonobstructive nephrolithiasis.   Electronically Signed   By: Van Clines M.D.   On: 10/14/2016 13:33  RADIOGRAPHIC STUDIES: I have personally reviewed the radiological images as listed and agreed with the findings in the report. No results found.   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 PET shows- multiple liver mets/ omental-peritoneal mets; new lung mets. S/p cycle #1 of FOLFIRI + Vectibex- tolerating with moderate- severe difficulties [see discussion below].  # HOLD cycle #2 of FOLFIRI + vectibex;  given the multiple side effects. We will consider dose reduction for the next cycle of chemotherapy.  # diarrhea- G-2-3/ colostomy- add lomotil; new prescription given.  # nausea/vomitting- plan IV Zofran today. Zofran prescription given. Also given IV Ativan; Suspect significant anticipatory nausea vomiting.  # weight loss/poor apetite- See above; also referred to nutrition.  # skin rash- Grade 1. Poor tolerance to minocycline. Hold minocycline. Recommend clindamycin gel hydrocortisone cream. Discussed re: doxycycline.   # pain management/chronic pain-stable.; Continue  fentanyl 75 g every 72 hours and  oxycodone every 6-8 hours.New prescription given.  # Severe hypokalemia potassium 2.7; patient intolerant to by mouth potassium. Recommend 40 mg of IV potassium today.  #Follow-up approximately 2 weeks; labs/ chemo. One week labs possible IV fluids/potassium.   Orders Placed This Encounter  Procedures  . Amb Referral to Nutrition and Diabetic E    Referral Priority:   Urgent    Referral Type:   Consultation    Referral Reason:   Specialty Services Required    Number of Visits Requested:   [place], MD 11/01/2016 5:15 PM

## 2016-11-01 NOTE — Progress Notes (Signed)
Per Magdalene Patricia, RN, pt continues to c/o nausea. IV zofran given to pt at 81 am. Nira Conn, RN spoke with [name]. V/o obtained IV Ativan 1 mg IV push once in cancer center.

## 2016-11-01 NOTE — Assessment & Plan Note (Addendum)
#  Recommend Colon cancer with metastases- peritoneal/omental metastases-status post small bowel resection; liver metastases. PET shows- multiple liver mets/ omental-peritoneal mets; new lung mets. S/p cycle #1 of FOLFIRI + Vectibex- tolerating with moderate- severe difficulties [see discussion below].  # HOLD cycle #2 of FOLFIRI + vectibex; given the multiple side effects. We will consider dose reduction for the next cycle of chemotherapy.  # diarrhea- G-2-3/ colostomy- add lomotil; new prescription given.  # nausea/vomitting- plan IV Zofran today. Zofran prescription given. Also given IV Ativan; Suspect significant anticipatory nausea vomiting.  # weight loss/poor apetite- See above; also referred to nutrition.  # skin rash- Grade 1. Poor tolerance to minocycline. Hold minocycline. Recommend clindamycin gel hydrocortisone cream. Discussed re: doxycycline.   # pain management/chronic pain-stable.; Continue  fentanyl 75 g every 72 hours and  oxycodone every 6-8 hours.New prescription given.  # Severe hypokalemia potassium 2.7; patient intolerant to by mouth potassium. Recommend 40 mg of IV potassium today.  #Follow-up approximately 2 weeks; labs/ chemo. One week labs possible IV fluids/potassium.

## 2016-11-03 ENCOUNTER — Inpatient Hospital Stay: Payer: BLUE CROSS/BLUE SHIELD

## 2016-11-08 ENCOUNTER — Other Ambulatory Visit: Payer: Self-pay | Admitting: *Deleted

## 2016-11-08 ENCOUNTER — Inpatient Hospital Stay: Payer: BLUE CROSS/BLUE SHIELD

## 2016-11-08 VITALS — BP 109/79 | HR 99 | Temp 96.2°F | Resp 18

## 2016-11-08 DIAGNOSIS — R112 Nausea with vomiting, unspecified: Secondary | ICD-10-CM

## 2016-11-08 DIAGNOSIS — E860 Dehydration: Secondary | ICD-10-CM

## 2016-11-08 DIAGNOSIS — C772 Secondary and unspecified malignant neoplasm of intra-abdominal lymph nodes: Secondary | ICD-10-CM

## 2016-11-08 DIAGNOSIS — C186 Malignant neoplasm of descending colon: Secondary | ICD-10-CM

## 2016-11-08 LAB — CBC WITH DIFFERENTIAL/PLATELET
BASOS ABS: 0.1 10*3/uL (ref 0–0.1)
Basophils Relative: 1 %
Eosinophils Absolute: 0 10*3/uL (ref 0–0.7)
Eosinophils Relative: 1 %
HEMATOCRIT: 38.2 % — AB (ref 40.0–52.0)
Hemoglobin: 12.7 g/dL — ABNORMAL LOW (ref 13.0–18.0)
LYMPHS PCT: 21 %
Lymphs Abs: 1.8 10*3/uL (ref 1.0–3.6)
MCH: 27.1 pg (ref 26.0–34.0)
MCHC: 33.2 g/dL (ref 32.0–36.0)
MCV: 81.5 fL (ref 80.0–100.0)
Monocytes Absolute: 0.6 10*3/uL (ref 0.2–1.0)
Monocytes Relative: 7 %
NEUTROS ABS: 6.1 10*3/uL (ref 1.4–6.5)
Neutrophils Relative %: 70 %
Platelets: 337 10*3/uL (ref 150–440)
RBC: 4.69 MIL/uL (ref 4.40–5.90)
RDW: 16.9 % — ABNORMAL HIGH (ref 11.5–14.5)
WBC: 8.6 10*3/uL (ref 3.8–10.6)

## 2016-11-08 LAB — BASIC METABOLIC PANEL
Anion gap: 8 (ref 5–15)
BUN: 8 mg/dL (ref 6–20)
CALCIUM: 8.8 mg/dL — AB (ref 8.9–10.3)
CO2: 25 mmol/L (ref 22–32)
CREATININE: 1.06 mg/dL (ref 0.61–1.24)
Chloride: 104 mmol/L (ref 101–111)
GFR calc Af Amer: >60 mL/min (ref >60)
GFR calc non Af Amer: >60 mL/min (ref >60)
GLUCOSE: 127 mg/dL — AB (ref 65–99)
Potassium: 3.4 mmol/L — ABNORMAL LOW (ref 3.5–5.1)
Sodium: 137 mmol/L (ref 135–145)

## 2016-11-08 LAB — MAGNESIUM: Magnesium: 1.9 mg/dL (ref 1.7–2.4)

## 2016-11-08 MED ORDER — LORAZEPAM 2 MG/ML IJ SOLN
1.0000 mg | Freq: Once | INTRAMUSCULAR | Status: AC
  Administered 2016-11-08: 1 mg via INTRAVENOUS
  Filled 2016-11-08: qty 1

## 2016-11-08 MED ORDER — SODIUM CHLORIDE 0.9 % IV SOLN
Freq: Once | INTRAVENOUS | Status: AC
  Administered 2016-11-08: 10:00:00 via INTRAVENOUS
  Filled 2016-11-08: qty 4

## 2016-11-08 MED ORDER — SODIUM CHLORIDE 0.9 % IV SOLN
Freq: Once | INTRAVENOUS | Status: AC
  Administered 2016-11-08: 10:00:00 via INTRAVENOUS
  Filled 2016-11-08: qty 1000

## 2016-11-08 MED ORDER — HEPARIN SOD (PORK) LOCK FLUSH 100 UNIT/ML IV SOLN
500.0000 [IU] | Freq: Once | INTRAVENOUS | Status: AC | PRN
  Administered 2016-11-08: 500 [IU]
  Filled 2016-11-08: qty 5

## 2016-11-08 MED ORDER — SODIUM CHLORIDE 0.9% FLUSH
10.0000 mL | INTRAVENOUS | Status: DC | PRN
  Administered 2016-11-08: 10 mL
  Filled 2016-11-08: qty 10

## 2016-11-08 NOTE — Progress Notes (Signed)
Nutrition Assessment   Reason for Assessment:   Weight loss.  Patient is followed by Dr.Brahamanday.  ASSESSMENT:  56 year old male with colon cancer diagnosis in Aug 2016.  Patient s/p resection with colostomy.  Patient now with recurrent metastatic cancer to liver, omentum and lung.  Receiving pallative chemotherapy.  Past medical history of sarcodosis of lung, GERD, HTN, Fe deficency anemia  Patient seen during infusion this am with wife at chairside.  Patient reports that appetite is better compared to few weeks ago.  Reports 2 weeks ago had severe nausea and diarrhea that effected appetite.  Reports 12 pound weight loss in about 2 weeks.  Now patient reports eating a little bit better. Typically has egg and bacon for breakfast, snacks on jello or pudding or popcisles during the day and then has meat and potatoes for dinner.  Reports has been trying to prepare bland, grilled or baked foods (not fried) to help with nausea, diarrhea.    Nutrition Focused Physical Exam: deferred  Medications: imodium, phenergan, ambien, ativan, zofran, KCL  Labs: K 3.4, glucose 127, calcium 8.8  Anthropometrics:   Height: 71 inches Weight: 269 pounds UBW: 280 BMI: 37  4% weight loss in 2 weeks   Estimated Energy Needs  Kcals: 2600 calories/d Protein: 94-117 g/d (Using IBW of 78 kg) Fluid: 2.6 L/d  NUTRITION DIAGNOSIS: Unintentional weight loss related to cancer and cancer related treatments as evidenced by 4% weight loss in 2 weeks.   MALNUTRITION DIAGNOSIS: Patient meets criteria for moderate malnutrition in context of acute illness as evidenced by 4% weight loss in 2 weeks and eating < 75% of energy intake for > 7 days   INTERVENTION:   Discussed strategies to handle nausea and vomiting.  Fact sheet given. Teach back used. Discussed ways to increase calories and protein. Encouraged small frequent meals Discussed foods to help thicken stool in ostomy Provide samples of "milky" oral  nutrition supplements and "juicy" supplements.Coupons given as well.    MONITORING, EVALUATION, GOAL: Patient will consume adequate calories and protein to maintain weight and lean muscle mass.     NEXT VISIT: March 5 due infusion  [name], [place], [place] Registered Dietitian [phone] (pager)

## 2016-11-08 NOTE — Progress Notes (Signed)
1044- Pt still complaining of nausea after receiving IV Zofran 8mg . Order obtained from Dr Rogue Bussing for 1mg  Ativan IV. Will continue to monitor  1203- Pt reports nausea better after receiving Ativan

## 2016-11-15 ENCOUNTER — Inpatient Hospital Stay: Payer: BLUE CROSS/BLUE SHIELD

## 2016-11-15 ENCOUNTER — Inpatient Hospital Stay: Payer: BLUE CROSS/BLUE SHIELD | Attending: Internal Medicine | Admitting: Internal Medicine

## 2016-11-15 VITALS — BP 137/84 | HR 82 | Temp 96.1°F | Resp 16 | Ht 71.0 in | Wt 268.0 lb

## 2016-11-15 DIAGNOSIS — R21 Rash and other nonspecific skin eruption: Secondary | ICD-10-CM | POA: Diagnosis not present

## 2016-11-15 DIAGNOSIS — D869 Sarcoidosis, unspecified: Secondary | ICD-10-CM | POA: Insufficient documentation

## 2016-11-15 DIAGNOSIS — K219 Gastro-esophageal reflux disease without esophagitis: Secondary | ICD-10-CM | POA: Insufficient documentation

## 2016-11-15 DIAGNOSIS — R634 Abnormal weight loss: Secondary | ICD-10-CM | POA: Diagnosis not present

## 2016-11-15 DIAGNOSIS — C7800 Secondary malignant neoplasm of unspecified lung: Secondary | ICD-10-CM | POA: Insufficient documentation

## 2016-11-15 DIAGNOSIS — I10 Essential (primary) hypertension: Secondary | ICD-10-CM | POA: Insufficient documentation

## 2016-11-15 DIAGNOSIS — C787 Secondary malignant neoplasm of liver and intrahepatic bile duct: Secondary | ICD-10-CM | POA: Diagnosis not present

## 2016-11-15 DIAGNOSIS — Z5111 Encounter for antineoplastic chemotherapy: Secondary | ICD-10-CM | POA: Diagnosis not present

## 2016-11-15 DIAGNOSIS — Z87442 Personal history of urinary calculi: Secondary | ICD-10-CM | POA: Insufficient documentation

## 2016-11-15 DIAGNOSIS — C786 Secondary malignant neoplasm of retroperitoneum and peritoneum: Secondary | ICD-10-CM | POA: Insufficient documentation

## 2016-11-15 DIAGNOSIS — E669 Obesity, unspecified: Secondary | ICD-10-CM | POA: Insufficient documentation

## 2016-11-15 DIAGNOSIS — Z79899 Other long term (current) drug therapy: Secondary | ICD-10-CM | POA: Diagnosis not present

## 2016-11-15 DIAGNOSIS — C772 Secondary and unspecified malignant neoplasm of intra-abdominal lymph nodes: Secondary | ICD-10-CM

## 2016-11-15 DIAGNOSIS — F329 Major depressive disorder, single episode, unspecified: Secondary | ICD-10-CM | POA: Insufficient documentation

## 2016-11-15 DIAGNOSIS — C186 Malignant neoplasm of descending colon: Secondary | ICD-10-CM

## 2016-11-15 DIAGNOSIS — G893 Neoplasm related pain (acute) (chronic): Secondary | ICD-10-CM | POA: Insufficient documentation

## 2016-11-15 DIAGNOSIS — E876 Hypokalemia: Secondary | ICD-10-CM | POA: Insufficient documentation

## 2016-11-15 DIAGNOSIS — R197 Diarrhea, unspecified: Secondary | ICD-10-CM | POA: Diagnosis not present

## 2016-11-15 DIAGNOSIS — Z933 Colostomy status: Secondary | ICD-10-CM | POA: Diagnosis not present

## 2016-11-15 LAB — CBC WITH DIFFERENTIAL/PLATELET
Basophils Absolute: 0.1 10*3/uL (ref 0–0.1)
Basophils Relative: 1 %
Eosinophils Absolute: 0 10*3/uL (ref 0–0.7)
Eosinophils Relative: 0 %
HEMATOCRIT: 35.7 % — AB (ref 40.0–52.0)
Hemoglobin: 11.9 g/dL — ABNORMAL LOW (ref 13.0–18.0)
LYMPHS ABS: 1.2 10*3/uL (ref 1.0–3.6)
LYMPHS PCT: 14 %
MCH: 27.3 pg (ref 26.0–34.0)
MCHC: 33.3 g/dL (ref 32.0–36.0)
MCV: 82 fL (ref 80.0–100.0)
MONO ABS: 0.7 10*3/uL (ref 0.2–1.0)
MONOS PCT: 9 %
NEUTROS ABS: 6.6 10*3/uL — AB (ref 1.4–6.5)
Neutrophils Relative %: 76 %
Platelets: 336 10*3/uL (ref 150–440)
RBC: 4.36 MIL/uL — ABNORMAL LOW (ref 4.40–5.90)
RDW: 18.1 % — AB (ref 11.5–14.5)
WBC: 8.7 10*3/uL (ref 3.8–10.6)

## 2016-11-15 LAB — COMPREHENSIVE METABOLIC PANEL
ALK PHOS: 136 U/L — AB (ref 38–126)
ALT: 23 U/L (ref 17–63)
AST: 32 U/L (ref 15–41)
Albumin: 3.4 g/dL — ABNORMAL LOW (ref 3.5–5.0)
Anion gap: 2 — ABNORMAL LOW (ref 5–15)
BILIRUBIN TOTAL: 0.4 mg/dL (ref 0.3–1.2)
BUN: 9 mg/dL (ref 6–20)
CALCIUM: 8.1 mg/dL — AB (ref 8.9–10.3)
CO2: 26 mmol/L (ref 22–32)
Chloride: 106 mmol/L (ref 101–111)
Creatinine, Ser: 0.99 mg/dL (ref 0.61–1.24)
GFR calc Af Amer: >60 mL/min (ref >60)
Glucose, Bld: 100 mg/dL — ABNORMAL HIGH (ref 65–99)
POTASSIUM: 3.2 mmol/L — AB (ref 3.5–5.1)
Sodium: 134 mmol/L — ABNORMAL LOW (ref 135–145)
TOTAL PROTEIN: 7.1 g/dL (ref 6.5–8.1)

## 2016-11-15 MED ORDER — OXYCODONE HCL 10 MG PO TABS: 10.0000 mg | ORAL_TABLET | Freq: Four times a day (QID) | ORAL | 0 refills | Status: DC | PRN

## 2016-11-15 MED ORDER — SODIUM CHLORIDE 0.9 % IV SOLN
2400.0000 mg/m2 | INTRAVENOUS | Status: DC
  Administered 2016-11-15: 6050 mg via INTRAVENOUS
  Filled 2016-11-15: qty 100

## 2016-11-15 MED ORDER — DEXAMETHASONE SODIUM PHOSPHATE 100 MG/10ML IJ SOLN: 10.0000 mg | Freq: Once | INTRAMUSCULAR | Status: DC

## 2016-11-15 MED ORDER — PALONOSETRON HCL INJECTION 0.25 MG/5ML
0.2500 mg | Freq: Once | INTRAVENOUS | Status: AC
  Administered 2016-11-15: 0.25 mg via INTRAVENOUS
  Filled 2016-11-15: qty 5

## 2016-11-15 MED ORDER — LORAZEPAM 2 MG/ML IJ SOLN
1.0000 mg | Freq: Once | INTRAMUSCULAR | Status: AC
  Administered 2016-11-15: 1 mg via INTRAVENOUS
  Filled 2016-11-15: qty 1

## 2016-11-15 MED ORDER — DEXAMETHASONE SODIUM PHOSPHATE 10 MG/ML IJ SOLN
10.0000 mg | Freq: Once | INTRAMUSCULAR | Status: AC
  Administered 2016-11-15: 10 mg via INTRAVENOUS
  Filled 2016-11-15: qty 1

## 2016-11-15 MED ORDER — FENTANYL 75 MCG/HR TD PT72: 75.0000 ug | MEDICATED_PATCH | TRANSDERMAL | 0 refills | Status: DC

## 2016-11-15 MED ORDER — SODIUM CHLORIDE 0.9% FLUSH
10.0000 mL | INTRAVENOUS | Status: DC | PRN
  Administered 2016-11-15: 10 mL
  Filled 2016-11-15: qty 10

## 2016-11-15 MED ORDER — CITALOPRAM HYDROBROMIDE 20 MG PO TABS: 20.0000 mg | ORAL_TABLET | Freq: Every day | ORAL | 3 refills | Status: DC

## 2016-11-15 MED ORDER — PROCHLORPERAZINE MALEATE 10 MG PO TABS: 10.0000 mg | ORAL_TABLET | Freq: Four times a day (QID) | ORAL | 1 refills | Status: DC | PRN

## 2016-11-15 MED ORDER — DEXTROSE 5 % IV SOLN
150.0000 mg/m2 | Freq: Once | INTRAVENOUS | Status: AC
  Administered 2016-11-15: 380 mg via INTRAVENOUS
  Filled 2016-11-15: qty 15

## 2016-11-15 MED ORDER — ATROPINE SULFATE 1 MG/ML IJ SOLN
0.5000 mg | Freq: Once | INTRAMUSCULAR | Status: AC | PRN
  Administered 2016-11-15: 0.5 mg via INTRAVENOUS
  Filled 2016-11-15: qty 1

## 2016-11-15 MED ORDER — LEUCOVORIN CALCIUM INJECTION 350 MG
1000.0000 mg | Freq: Once | INTRAVENOUS | Status: AC
  Administered 2016-11-15: 1000 mg via INTRAVENOUS
  Filled 2016-11-15: qty 50

## 2016-11-15 MED ORDER — SODIUM CHLORIDE 0.9 % IV SOLN
Freq: Once | INTRAVENOUS | Status: AC
  Administered 2016-11-15: 10:00:00 via INTRAVENOUS
  Filled 2016-11-15: qty 1000

## 2016-11-15 NOTE — Progress Notes (Signed)
Patient presents to clinic today for chemotherapy-folfiri and vectibix. He c/o of "stabbing pain abdominal pressure" in left upper and mid quadrant-ongoing pain. Currently Today rates pain - 4/10. Last pain medication take- oxycodone at 6 am today. Requesting RF on fenytal and oxycodone today. Pt not taking minocycline dosing to prevent acne rash r/t to vectibix. He reports that this minocycline was Last taken approx. 4 weeks ago. "I only took this for a few days after that treatment dosing that week. I couldn't take anymore due to an increase nausea." He is using the clindamycin gel. "I don't think it works anyway. I don't know why I take this to be honest."  Pt declines "taking oral potassium these tablets due to nausea. I even get nauseated with IV potassium. I just don't want to take it. I don't want to throw up."

## 2016-11-15 NOTE — Progress Notes (Signed)
Nutrition Follow-up:  Met with patient and wife in infusion this am.  Patient report was eating well up until Saturday and Sunday and nausea started back.  Nausea medications being adjusted today.  Patient reports trying chocolate ensure/boost and was able to tolerate it.  Has been trying to eat bland foods (crackers, peanut butter, baked chicken, boiled potatoes, jello, icy popcisles.    Medications: reviewed  Labs: Na 134, K 3.2, glucose 100  Anthropometrics:   Patient weight today was 268 pounds decreased from 269 pounds on 2/19.   NUTRITION DIAGNOSIS: Unintentional weight loss continues   MALNUTRITION DIAGNOSIS: Moderate malnutrition continues   INTERVENTION:   Reviewed strategies when having nausea and vomiting.  Patient reports has fact sheet that was given in a folder.   Encouraged small frequent meals. Encouraged continued intake of oral nutrition supplements for additional calories and protein   MONITORING, EVALUATION, GOAL: Patient will consume adequate calories and protein to maintain weight and lean muscle mass.   NEXT VISIT: March 19th during infusion  Malerie Eakins B. Zenia Resides, [place], [place] Registered Dietitian [phone] (pager)

## 2016-11-15 NOTE — Assessment & Plan Note (Addendum)
#  Recommend Colon cancer with metastases- peritoneal/omental metastases-status post small bowel resection; liver metastases. PET shows- multiple liver mets/ omental-peritoneal mets; new lung mets. S/p cycle #1 of FOLFIRI + Vectibex- tolerating with moderate- severe difficulties [see discussion below].  # proceed cycle #2 of FOLFIRI today; [HOLD vectibex for now]; discontinue vectibex/ 5FU bolus; decrease the dose of iri to 150mg /m2.   # Severe Nausea- ? Anticipatory- recommend ativan. Added compazine; continue phenergan.   # diarrhea- G-2-3/ colostomy-improved.   # weight loss/poor apetite- improved;  S/p  Nutrition.stable.   # skin rash- Grade 1. Improved [held vectibex for now]  # pain management/chronic pain-stable.; Continue  fentanyl 75 g every 72 hours and  oxycodone every 6-8 hours.New prescription given.  # Depression- recommend celexa 20 mg/day.  # Severe hypokalemia potassium 2.7;improved; but today 3.2.  patient intolerant to by mouth potassium. Recommend 40 mg of IV potassium today.  #Follow-up approximately 2 weeks; labs/ chemo. One week labs possible IV fluids/potassium.

## 2016-11-15 NOTE — Progress Notes (Signed)
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 Specimen integrity: Intact. Macroscopic tumor perforation: Not identified. Invasive tumor: Maximum size: 5.5 cm. Histologic type(s): Invasive adenocarcinoma. Histologic grade and differentiation: G1: well differentiated/low grade  Pathologic Staging: pT4b, pN1b, pM1b . 2.Patient was started on FOLFOX in September of 2016,after 2 cycles of chemotherapy patient developed neuropathy. Patient had a previous neuropathy as a baseline which increased so was switched over to FOLFIRI I IN December of 2016 As patient has clear os wild-type we can proceed to add either a Avastin or cetuximab from the next chemotherapy.(November, 2016)  Avastin was added as abdominal wound has completely healed now Avastin on hold (December, 2016) because of hypertension Chemotherapy was discontinued after protocol 8 cycle because of significant side effect (last chemotherapy was on October 28, 2015).  # DEC 2017- small bowel obstruction s/p resection [Dr.Hoxworth]- positive for recurrence. 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 ? 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  male  with above history of recurrent metastatic colon cancer to the liver/omentum on palliative chemo- with FOLFIRI + vectibex Cycle 1 approximately 4 weeks ago.  Patient continues to have intermittent nausea with vomiting. He continues to have intermittent loose stools; currently improved on Imodium/Lomotil..  No fevers. He feels extremely tired. Complains of a rash on his face and his back. Denies any significant pain or itching. His skin rash is improving. He feels depressed. He continues to have a flareup of his pain abdomen; knees and lower extremities. Currently on fentanyl and Percocet.  REVIEW OF SYSTEMS:  A complete 10 point review of system is done which is negative except mentioned above/history of present illness.   PAST MEDICAL HISTORY :  Past Medical History:  Diagnosis Date  . Abdominal pain    mid abdomen  . Adiposity 03/21/2015  . Arthritis   . Besnier-Boeck disease ([place]) 12/21/2012  . Colon cancer ([place])   . 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 Extreme obesity ([place]) 12/21/2012  . GERD (gastroesophageal reflux disease)   . History of kidney stones   . History of transfusion   . Hypertension    has been off BP med x 6 months  . Iron deficiency anemia due to chronic blood loss 05/08/2015  .  LBP (low back pain) 05/28/2014  . Neuropathy ([place])   . PONV (postoperative nausea and vomiting)   . Sarcoid ([place]) 10/2012  . Skin cancer     "it flairs up with sarcoidosis"    PAST SURGICAL HISTORY :   Past Surgical History:  Procedure Laterality Date  . CARDIAC CATHETERIZATION    . FLEXIBLE SIGMOIDOSCOPY N/A 05/07/2015   Procedure: FLEXIBLE SIGMOIDOSCOPY;  Surgeon: Laurence Spates, MD;  Location: [facility];  Service: Endoscopy;  Laterality: N/A;  . LAPAROSCOPIC PARTIAL COLECTOMY N/A 05/07/2015   Procedure: LAPAROSCOPIC ASSITED PARTIAL COLECTOMY WITH COLOSTOMY, SMALL BOWEL RESECTION, EXCISION OF PERITONEAL NODULE;  Surgeon: [name], MD;  Location: [facility];  Service: General;  Laterality: N/A;  . LAPAROTOMY N/A 08/10/2016   Procedure: EXPLORATORY LAPAROTOMY small bowel resection abdominal wall resection partial omentectomy;  Surgeon: Excell Seltzer, MD;  Location: [facility];  Service: General;  Laterality: N/A;  . PORTACATH PLACEMENT Right 05/27/2015   Procedure: INSERTION PORT-A-CATH;  Surgeon: [name], MD;  Location: [facility];  Service: General;  Laterality: Right;    FAMILY HISTORY :   Family History  Problem Relation Age of Onset  . Arthritis Mother   . Heart disease Mother   . Stroke Father   . Prostate cancer Neg Hx   . Bladder Cancer Neg Hx     SOCIAL HISTORY:   Social History  Substance Use Topics  . Smoking status: Passive Smoke Exposure - Never Smoker  . Smokeless tobacco: Never Used  . Alcohol use No    ALLERGIES:  is allergic to versed [midazolam]; gabapentin; and paba derivatives.  MEDICATIONS:  Current Outpatient Prescriptions  Medication Sig Dispense Refill  . clindamycin (CLINDAGEL) 1 % gel Apply topically daily. Apply to affected areas once a day. 30 g 0  . diphenoxylate-atropine (LOMOTIL) 2.5-0.025 MG tablet Take 1 tablet by mouth 4 (four) times daily as needed for diarrhea or loose stools. 30 tablet 3  . fentaNYL (DURAGESIC - DOSED MCG/HR) 75 MCG/HR Place 1 patch (75 mcg total) onto the skin every 3 (three) days. 10 patch 0  . lidocaine-prilocaine (EMLA) cream Apply 1 application  topically as needed. Apply small amount to port site at least 1 hour prior to it being accessed, cover with plastic wrap 30 g 1  . loperamide (IMODIUM A-D) 2 MG tablet Take 2 mg by mouth 4 (four) times daily as needed for diarrhea or loose stools.    . Oxycodone HCl 10 MG TABS Take 1 tablet (10 mg total) by mouth every 6 (six) hours as needed (severe pain). 60 tablet 0  . zolpidem (AMBIEN) 10 MG tablet Take 10-15 mg by mouth at bedtime as needed for sleep.    . citalopram (CELEXA) 20 MG tablet Take 1 tablet (20 mg total) by mouth daily. 30 tablet 3  . lisinopril (PRINIVIL,ZESTRIL) 10 MG tablet Take 10 mg by mouth daily.    Marland Kitchen LORazepam (ATIVAN) 0.5 MG tablet Take 1 tablet (0.5 mg total) by mouth 2 (two) times daily as needed for anxiety. 60 tablet 0  . minocycline (DYNACIN) 50 MG tablet Take 1 tablet (50 mg total) by mouth 2 (two) times daily. (Patient not taking: Reported on 11/15/2016) 60 tablet 3  . ondansetron (ZOFRAN) 8 MG tablet Take 1 tablet (8 mg total) by mouth every 8 (eight) hours as needed for nausea or vomiting. (Patient not taking: Reported on 11/15/2016) 30 tablet 3  . potassium chloride SA (K-DUR,KLOR-CON) 20 MEQ tablet Take 1 tablet (20 mEq total)  by mouth 2 (two) times daily. (Patient not taking: Reported on 11/01/2016) 60 tablet 3  . prochlorperazine (COMPAZINE) 10 MG tablet Take 1 tablet (10 mg total) by mouth every 6 (six) hours as needed for nausea or vomiting. 60 tablet 1  . promethazine (PHENERGAN) 25 MG tablet Take 1 tablet (25 mg total) by mouth every 6 (six) hours as needed for nausea or vomiting. 30 tablet 1   No current facility-administered medications for this visit.    Facility-Administered Medications Ordered in Other Visits  Medication Dose Route Frequency Provider Last Rate Last Dose  . sodium chloride flush (NS) 0.9 % injection 10 mL  10 mL Intravenous PRN Forest Gleason, MD   10 mL at 02/11/16 1326  . sodium chloride flush (NS) 0.9 % injection 10 mL  10 mL Intravenous  PRN Cammie Sickle, MD   10 mL at 11/17/16 1435    PHYSICAL EXAMINATION: ECOG PERFORMANCE STATUS: 0 - Asymptomatic  BP 137/84 (BP Location: Left Arm, Patient Position: Sitting)   Pulse 82   Temp (!) 96.1 F (35.6 C) (Tympanic)   Resp 16   Ht _0  (1.803 m)   Wt 268 lb (121.6 kg)   BMI 37.38 kg/m   Filed Weights   11/15/16 0903  Weight: 268 lb (121.6 kg)    GENERAL: Obese Caucasian male patient. Alert, no distress and comfortable.  Obese. Accompanied by is wife. Walking by himself.  EYES: no pallor or icterus OROPHARYNX: no thrush or ulceration; good dentition  NECK: supple, no masses felt LYMPH:  no palpable lymphadenopathy in the cervical, axillary or inguinal regions LUNGS: clear to auscultation and  No wheeze or crackles HEART/CVS: regular rate & rhythm and no murmurs; No lower extremity edema ABDOMEN:abdomen soft, non-tender and normal bowel sounds; Positive for colostomy. Incisions were healing. Musculoskeletal:no cyanosis of digits and no clubbing  PSYCH: alert & oriented x 3 with fluent speech; anxious/ depressed/tearful NEURO: no focal motor/sensory deficits SKIN:  no rashes or significant lesions  LABORATORY DATA:  I have reviewed the data as listed    Component Value Date/Time   NA 134 (L) 11/15/2016 0838   NA 138 01/02/2015 1555   K 3.2 (L) 11/15/2016 0838   K 3.7 01/02/2015 1555   CL 106 11/15/2016 0838   CL 109 01/02/2015 1555   CO2 26 11/15/2016 0838   CO2 22 01/02/2015 1555   GLUCOSE 100 (H) 11/15/2016 0838   GLUCOSE 110 (H) 01/02/2015 1555   BUN 9 11/15/2016 0838   BUN 9 01/02/2015 1555   CREATININE 0.99 11/15/2016 0838   CREATININE 1.04 01/02/2015 1555   CALCIUM 8.1 (L) 11/15/2016 0838   CALCIUM 8.8 (L) 01/02/2015 1555   PROT 7.1 11/15/2016 0838   PROT 8.2 (H) 01/02/2015 1555   ALBUMIN 3.4 (L) 11/15/2016 0838   ALBUMIN 4.0 01/02/2015 1555   AST 32 11/15/2016 0838   AST 39 01/02/2015 1555   ALT 23 11/15/2016 0838   ALT 55 01/02/2015  1555   ALKPHOS 136 (H) 11/15/2016 0838   ALKPHOS 125 01/02/2015 1555   BILITOT 0.4 11/15/2016 0838   BILITOT 0.5 01/02/2015 1555   GFRNONAA >60 11/15/2016 0838   GFRNONAA >60 01/02/2015 1555   GFRAA >60 11/15/2016 0838   GFRAA >60 01/02/2015 1555    No results found for: SPEP, UPEP  Lab Results  Component Value Date   WBC 8.7 11/15/2016   NEUTROABS 6.6 (H) 11/15/2016   HGB 11.9 (L) 11/15/2016   HCT 35.7 (L)  11/15/2016   MCV 82.0 11/15/2016   PLT 336 11/15/2016      Chemistry      Component Value Date/Time   NA 134 (L) 11/15/2016 0838   NA 138 01/02/2015 1555   K 3.2 (L) 11/15/2016 0838   K 3.7 01/02/2015 1555   CL 106 11/15/2016 0838   CL 109 01/02/2015 1555   CO2 26 11/15/2016 0838   CO2 22 01/02/2015 1555   BUN 9 11/15/2016 0838   BUN 9 01/02/2015 1555   CREATININE 0.99 11/15/2016 0838   CREATININE 1.04 01/02/2015 1555      Component Value Date/Time   CALCIUM 8.1 (L) 11/15/2016 0838   CALCIUM 8.8 (L) 01/02/2015 1555   ALKPHOS 136 (H) 11/15/2016 0838   ALKPHOS 125 01/02/2015 1555   AST 32 11/15/2016 0838   AST 39 01/02/2015 1555   ALT 23 11/15/2016 0838   ALT 55 01/02/2015 1555   BILITOT 0.4 11/15/2016 0838   BILITOT 0.5 01/02/2015 1555     IMPRESSION: 1. Unfortunately there is new metastatic disease to the right lung, extensive metastatic disease to the liver, and metastatic disease to the omentum, peritoneal margin, and mesentery. 2. Other imaging findings of potential clinical significance: Coronary atherosclerosis. 3. Bilateral nonobstructive nephrolithiasis.   Electronically Signed   By: Van Clines M.D.   On: 10/14/2016 13:33  RADIOGRAPHIC STUDIES: I have personally reviewed the radiological images as listed and agreed with the findings in the report. No results found.   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 PET shows- multiple liver mets/ omental-peritoneal mets; new lung mets. S/p cycle #1 of FOLFIRI + Vectibex- tolerating with moderate- severe difficulties [see discussion below].  # proceed cycle #2 of FOLFIRI today; [HOLD vectibex for now]; discontinue vectibex/ 5FU bolus; decrease the dose of iri to 112m/m2.   # Severe Nausea- ? Anticipatory- recommend ativan. Added compazine; continue phenergan.   # diarrhea- G-2-3/ colostomy-improved.   # weight loss/poor apetite- improved;  S/p  Nutrition.stable.   # skin rash- Grade 1. Improved [held vectibex for now]  # pain management/chronic pain-stable.; Continue  fentanyl 75 g every 72 hours and  oxycodone every 6-8 hours.New prescription given.  # Depression- recommend celexa 20 mg/day.  # Severe hypokalemia potassium 2.7;improved; but today 3.2.  patient intolerant to by mouth potassium. Recommend 40 mg of IV potassium today.  #Follow-up approximately 2 weeks; labs/ chemo. One week labs possible IV fluids/potassium.   Orders Placed This Encounter  Procedures  . CBC with Differential    Standing Status:   Future    Standing Expiration Date:   11/15/2017  . Basic metabolic panel    Standing Status:   Future    Standing Expiration Date:   11/15/2017  . Magnesium    Standing Status:   Future    Standing Expiration Date:   11/15/2017  . CBC with Differential    Standing Status:   Future    Standing Expiration Date:   11/15/2017  . Comprehensive metabolic panel    Standing Status:   Future    Standing Expiration Date:   11/15/2017  . Magnesium    Standing Status:   Future    Standing Expiration Date:   11/15/2017       GCammie Sickle MD 11/17/2016 4:26 PM

## 2016-11-16 LAB — CEA: CEA: 86.8 ng/mL — AB (ref 0.0–4.7)

## 2016-11-17 ENCOUNTER — Other Ambulatory Visit: Payer: Self-pay | Admitting: *Deleted

## 2016-11-17 ENCOUNTER — Inpatient Hospital Stay: Payer: BLUE CROSS/BLUE SHIELD

## 2016-11-17 DIAGNOSIS — C801 Malignant (primary) neoplasm, unspecified: Secondary | ICD-10-CM

## 2016-11-17 DIAGNOSIS — C186 Malignant neoplasm of descending colon: Secondary | ICD-10-CM | POA: Diagnosis not present

## 2016-11-17 MED ORDER — HEPARIN SOD (PORK) LOCK FLUSH 100 UNIT/ML IV SOLN
INTRAVENOUS | Status: AC
  Filled 2016-11-17: qty 5

## 2016-11-17 MED ORDER — SODIUM CHLORIDE 0.9% FLUSH
10.0000 mL | INTRAVENOUS | Status: DC | PRN
  Administered 2016-11-17: 10 mL via INTRAVENOUS
  Filled 2016-11-17: qty 10

## 2016-11-17 MED ORDER — HEPARIN SOD (PORK) LOCK FLUSH 100 UNIT/ML IV SOLN
500.0000 [IU] | Freq: Once | INTRAVENOUS | Status: AC
  Administered 2016-11-17: 500 [IU] via INTRAVENOUS

## 2016-11-17 MED ORDER — LORAZEPAM 0.5 MG PO TABS: 0.5000 mg | ORAL_TABLET | Freq: Two times a day (BID) | ORAL | 0 refills | Status: DC | PRN

## 2016-11-17 MED ORDER — PROMETHAZINE HCL 25 MG PO TABS: 25.0000 mg | ORAL_TABLET | Freq: Four times a day (QID) | ORAL | 1 refills | Status: DC | PRN

## 2016-11-22 ENCOUNTER — Inpatient Hospital Stay: Payer: BLUE CROSS/BLUE SHIELD

## 2016-11-22 DIAGNOSIS — C772 Secondary and unspecified malignant neoplasm of intra-abdominal lymph nodes: Principal | ICD-10-CM

## 2016-11-22 DIAGNOSIS — C186 Malignant neoplasm of descending colon: Secondary | ICD-10-CM

## 2016-11-22 LAB — CBC WITH DIFFERENTIAL/PLATELET
BASOS PCT: 1 %
Basophils Absolute: 0 10*3/uL (ref 0–0.1)
Eosinophils Absolute: 0.1 10*3/uL (ref 0–0.7)
Eosinophils Relative: 2 %
HCT: 31.9 % — ABNORMAL LOW (ref 40.0–52.0)
HEMOGLOBIN: 10.6 g/dL — AB (ref 13.0–18.0)
LYMPHS ABS: 1.3 10*3/uL (ref 1.0–3.6)
Lymphocytes Relative: 33 %
MCH: 27.4 pg (ref 26.0–34.0)
MCHC: 33.2 g/dL (ref 32.0–36.0)
MCV: 82.6 fL (ref 80.0–100.0)
MONOS PCT: 6 %
Monocytes Absolute: 0.3 10*3/uL (ref 0.2–1.0)
NEUTROS ABS: 2.3 10*3/uL (ref 1.4–6.5)
NEUTROS PCT: 58 %
Platelets: 210 10*3/uL (ref 150–440)
RBC: 3.86 MIL/uL — AB (ref 4.40–5.90)
RDW: 18.2 % — ABNORMAL HIGH (ref 11.5–14.5)
WBC: 3.9 10*3/uL (ref 3.8–10.6)

## 2016-11-22 LAB — BASIC METABOLIC PANEL
Anion gap: 5 (ref 5–15)
BUN: 10 mg/dL (ref 6–20)
CHLORIDE: 107 mmol/L (ref 101–111)
CO2: 25 mmol/L (ref 22–32)
CREATININE: 1.16 mg/dL (ref 0.61–1.24)
Calcium: 7.9 mg/dL — ABNORMAL LOW (ref 8.9–10.3)
GFR calc non Af Amer: >60 mL/min (ref >60)
Glucose, Bld: 126 mg/dL — ABNORMAL HIGH (ref 65–99)
Potassium: 3.5 mmol/L (ref 3.5–5.1)
SODIUM: 137 mmol/L (ref 135–145)

## 2016-11-22 LAB — MAGNESIUM: MAGNESIUM: 2 mg/dL (ref 1.7–2.4)

## 2016-11-22 MED ORDER — SODIUM CHLORIDE 0.9% FLUSH
10.0000 mL | INTRAVENOUS | Status: DC | PRN
  Administered 2016-11-22: 10 mL via INTRAVENOUS
  Filled 2016-11-22: qty 10

## 2016-11-22 MED ORDER — HEPARIN SOD (PORK) LOCK FLUSH 100 UNIT/ML IV SOLN
500.0000 [IU] | Freq: Once | INTRAVENOUS | Status: AC
Start: 2016-11-22 — End: 2016-11-22
  Administered 2016-11-22: 500 [IU] via INTRAVENOUS
  Filled 2016-11-22: qty 5

## 2016-11-22 NOTE — Progress Notes (Signed)
Patient not receiving treatment today, per [name] and lab results.  LJ

## 2016-11-29 ENCOUNTER — Inpatient Hospital Stay: Payer: BLUE CROSS/BLUE SHIELD

## 2016-11-29 ENCOUNTER — Inpatient Hospital Stay (HOSPITAL_BASED_OUTPATIENT_CLINIC_OR_DEPARTMENT_OTHER): Payer: BLUE CROSS/BLUE SHIELD | Admitting: Internal Medicine

## 2016-11-29 VITALS — BP 104/73 | HR 103 | Temp 97.9°F | Wt 275.1 lb

## 2016-11-29 DIAGNOSIS — G893 Neoplasm related pain (acute) (chronic): Secondary | ICD-10-CM

## 2016-11-29 DIAGNOSIS — E860 Dehydration: Secondary | ICD-10-CM

## 2016-11-29 DIAGNOSIS — Z79899 Other long term (current) drug therapy: Secondary | ICD-10-CM | POA: Diagnosis not present

## 2016-11-29 DIAGNOSIS — C772 Secondary and unspecified malignant neoplasm of intra-abdominal lymph nodes: Principal | ICD-10-CM

## 2016-11-29 DIAGNOSIS — C787 Secondary malignant neoplasm of liver and intrahepatic bile duct: Secondary | ICD-10-CM | POA: Diagnosis not present

## 2016-11-29 DIAGNOSIS — C7800 Secondary malignant neoplasm of unspecified lung: Secondary | ICD-10-CM

## 2016-11-29 DIAGNOSIS — C786 Secondary malignant neoplasm of retroperitoneum and peritoneum: Secondary | ICD-10-CM | POA: Diagnosis not present

## 2016-11-29 DIAGNOSIS — C186 Malignant neoplasm of descending colon: Secondary | ICD-10-CM

## 2016-11-29 DIAGNOSIS — R112 Nausea with vomiting, unspecified: Secondary | ICD-10-CM

## 2016-11-29 DIAGNOSIS — R197 Diarrhea, unspecified: Secondary | ICD-10-CM | POA: Diagnosis not present

## 2016-11-29 DIAGNOSIS — E876 Hypokalemia: Secondary | ICD-10-CM

## 2016-11-29 DIAGNOSIS — Z933 Colostomy status: Secondary | ICD-10-CM

## 2016-11-29 DIAGNOSIS — R634 Abnormal weight loss: Secondary | ICD-10-CM

## 2016-11-29 DIAGNOSIS — R21 Rash and other nonspecific skin eruption: Secondary | ICD-10-CM | POA: Diagnosis not present

## 2016-11-29 LAB — CBC WITH DIFFERENTIAL/PLATELET
BASOS PCT: 1 %
Basophils Absolute: 0 10*3/uL (ref 0–0.1)
EOS ABS: 0.1 10*3/uL (ref 0–0.7)
Eosinophils Relative: 3 %
HEMATOCRIT: 33.1 % — AB (ref 40.0–52.0)
HEMOGLOBIN: 11 g/dL — AB (ref 13.0–18.0)
LYMPHS ABS: 1.6 10*3/uL (ref 1.0–3.6)
Lymphocytes Relative: 36 %
MCH: 27 pg (ref 26.0–34.0)
MCHC: 33.2 g/dL (ref 32.0–36.0)
MCV: 81.3 fL (ref 80.0–100.0)
MONOS PCT: 10 %
Monocytes Absolute: 0.4 10*3/uL (ref 0.2–1.0)
NEUTROS ABS: 2.2 10*3/uL (ref 1.4–6.5)
NEUTROS PCT: 50 %
Platelets: 218 10*3/uL (ref 150–440)
RBC: 4.07 MIL/uL — AB (ref 4.40–5.90)
RDW: 17.5 % — ABNORMAL HIGH (ref 11.5–14.5)
WBC: 4.4 10*3/uL (ref 3.8–10.6)

## 2016-11-29 LAB — COMPREHENSIVE METABOLIC PANEL
ALK PHOS: 98 U/L (ref 38–126)
ALT: 19 U/L (ref 17–63)
ANION GAP: 6 (ref 5–15)
AST: 26 U/L (ref 15–41)
Albumin: 3.3 g/dL — ABNORMAL LOW (ref 3.5–5.0)
BILIRUBIN TOTAL: 0.6 mg/dL (ref 0.3–1.2)
BUN: 11 mg/dL (ref 6–20)
CALCIUM: 8.3 mg/dL — AB (ref 8.9–10.3)
CO2: 25 mmol/L (ref 22–32)
CREATININE: 1.04 mg/dL (ref 0.61–1.24)
Chloride: 106 mmol/L (ref 101–111)
Glucose, Bld: 100 mg/dL — ABNORMAL HIGH (ref 65–99)
Potassium: 3 mmol/L — ABNORMAL LOW (ref 3.5–5.1)
Sodium: 137 mmol/L (ref 135–145)
TOTAL PROTEIN: 6.7 g/dL (ref 6.5–8.1)

## 2016-11-29 LAB — MAGNESIUM: Magnesium: 1.8 mg/dL (ref 1.7–2.4)

## 2016-11-29 MED ORDER — ATROPINE SULFATE 1 MG/ML IJ SOLN
0.5000 mg | Freq: Once | INTRAMUSCULAR | Status: AC | PRN
  Administered 2016-11-29: 0.5 mg via INTRAVENOUS
  Filled 2016-11-29: qty 1

## 2016-11-29 MED ORDER — LEUCOVORIN CALCIUM INJECTION 350 MG
1000.0000 mg | Freq: Once | INTRAVENOUS | Status: AC
  Administered 2016-11-29: 1000 mg via INTRAVENOUS
  Filled 2016-11-29: qty 50

## 2016-11-29 MED ORDER — LORAZEPAM 2 MG/ML IJ SOLN
1.0000 mg | Freq: Once | INTRAMUSCULAR | Status: AC
  Administered 2016-11-29: 1 mg via INTRAVENOUS
  Filled 2016-11-29: qty 1

## 2016-11-29 MED ORDER — IRINOTECAN HCL CHEMO INJECTION 100 MG/5ML
150.0000 mg/m2 | Freq: Once | INTRAVENOUS | Status: AC
  Administered 2016-11-29: 380 mg via INTRAVENOUS
  Filled 2016-11-29: qty 15

## 2016-11-29 MED ORDER — DEXAMETHASONE SODIUM PHOSPHATE 10 MG/ML IJ SOLN
10.0000 mg | Freq: Once | INTRAMUSCULAR | Status: AC
Start: 2016-11-29 — End: 2016-11-29
  Administered 2016-11-29: 10 mg via INTRAVENOUS
  Filled 2016-11-29: qty 1

## 2016-11-29 MED ORDER — PALONOSETRON HCL INJECTION 0.25 MG/5ML
0.2500 mg | Freq: Once | INTRAVENOUS | Status: AC
  Administered 2016-11-29: 0.25 mg via INTRAVENOUS
  Filled 2016-11-29: qty 5

## 2016-11-29 MED ORDER — SODIUM CHLORIDE 0.9 % IV SOLN
Freq: Once | INTRAVENOUS | Status: AC
  Administered 2016-11-29: 10:00:00 via INTRAVENOUS
  Filled 2016-11-29: qty 1000

## 2016-11-29 MED ORDER — SODIUM CHLORIDE 0.9 % IV SOLN
2400.0000 mg/m2 | INTRAVENOUS | Status: DC
  Administered 2016-11-29: 6050 mg via INTRAVENOUS
  Filled 2016-11-29: qty 121

## 2016-11-29 MED ORDER — SODIUM CHLORIDE 0.9 % IV SOLN
Freq: Once | INTRAVENOUS | Status: AC
  Administered 2016-11-29: 10:00:00 via INTRAVENOUS
  Filled 2016-11-29: qty 20

## 2016-11-29 MED ORDER — OXYCODONE HCL 10 MG PO TABS: 10.0000 mg | ORAL_TABLET | Freq: Four times a day (QID) | ORAL | 0 refills | Status: DC | PRN

## 2016-11-29 NOTE — Assessment & Plan Note (Addendum)
#  Recommend Colon cancer with metastases- peritoneal/omental metastases-status post small bowel resection; liver metastases. PET shows- multiple liver mets/ omental-peritoneal mets; new lung mets. S/p cycle #2 of FOLFIRI [on hold- Vectibex sec to AEs; reduced dose of Iri]; Tolerating better.  # proceed cycle #3 of FOLFIRI today; Labs today reviewed;  acceptable for treatment today- except for- hypokalemia; [see below]  # Sleepiness- cut down on phenergan; continue zofram/ compazine.   # Severe Nausea- ? Anticipatory- improved on ativan/compazine/ zofran/ try to cut down on phenergan.  # diarrhea- G-2-3/ colostomy-improved.   # weight loss/poor apetite- improved;  S/p  Nutrition.stable.   # skin rash- Grade 1. Improved [held vectibex for now]  # pain management/chronic pain-STABLE; Continue  fentanyl 75 g every 72 hours and  oxycodone every 6-8 hours.New prescription given.  # Depression- on celexa 20 mg/day.recommended starting it.   -# Hypokalemia- today 3.2.  patient intolerant to by mouth potassium. supp IV potassium today.  #Follow-up approximately 2 weeks; labs/ chemo. One week labs possible IV fluids/potassium.

## 2016-11-29 NOTE — Progress Notes (Signed)
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 Specimen integrity: Intact. Macroscopic tumor perforation: Not identified. Invasive tumor: Maximum size: 5.5 cm. Histologic type(s): Invasive adenocarcinoma. Histologic grade and differentiation: G1: well differentiated/low grade  Pathologic Staging: pT4b, pN1b, pM1b . 2.Patient was started on FOLFOX in September of 2016,after 2 cycles of chemotherapy patient developed neuropathy. Patient had a previous neuropathy as a baseline which increased so was switched over to FOLFIRI I IN December of 2016 As patient has clear os wild-type we can proceed to add either a Avastin or cetuximab from the next chemotherapy.(November, 2016)  Avastin was added as abdominal wound has completely healed now Avastin on hold (December, 2016) because of hypertension Chemotherapy was discontinued after protocol 8 cycle because of significant side effect (last chemotherapy was on October 28, 2015).  # DEC 2017- small bowel obstruction s/p resection [Dr.Hoxworth]- positive for recurrence. 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 ? 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  male  with above history of recurrent metastatic colon cancer to the liver/omentum on palliative chemo- with FOLFIRI  [ vectibex on HOLD ] Cycle 2  approximately 2 weeks ago.  Patient notes to have tolerated this chemotherapy better. He did not have significant diarrhea. Denies any sores the mouth. Continues to have chronic pains. He continues on oxycodone. His appetite was better. Rash improved/resolved. He did not start his anti-depressant. His energy levels have improved. Nausea vomiting improved. He complains of drowsiness. He has been taking Phenergan for his nausea.   REVIEW OF SYSTEMS:  A complete 10 point review of system is done which is negative except mentioned above/history of present illness.   PAST MEDICAL HISTORY :  Past Medical History:  Diagnosis Date  . Abdominal pain    mid abdomen  . Adiposity 03/21/2015  . Arthritis   . Besnier-Boeck disease ([place]) 12/21/2012  . Colon cancer ([place])   . Disorder of peripheral nervous system ([place]) 05/28/2014  . Extreme obesity ([place]) 12/21/2012  . GERD (gastroesophageal reflux disease)   . History of kidney stones   . History of transfusion   . Hypertension    has been off BP med x 6 months  . Iron deficiency anemia due to chronic blood loss 05/08/2015  . LBP (  low back pain) 05/28/2014  . Neuropathy ([place])   . PONV (postoperative nausea and vomiting)   . Sarcoid ([place]) 10/2012  . Skin cancer    "it flairs up  with sarcoidosis"    PAST SURGICAL HISTORY :   Past Surgical History:  Procedure Laterality Date  . CARDIAC CATHETERIZATION    . FLEXIBLE SIGMOIDOSCOPY N/A 05/07/2015   Procedure: FLEXIBLE SIGMOIDOSCOPY;  Surgeon: Laurence Spates, MD;  Location: [facility];  Service: Endoscopy;  Laterality: N/A;  . LAPAROSCOPIC PARTIAL COLECTOMY N/A 05/07/2015   Procedure: LAPAROSCOPIC ASSITED PARTIAL COLECTOMY WITH COLOSTOMY, SMALL BOWEL RESECTION, EXCISION OF PERITONEAL NODULE;  Surgeon: [name], MD;  Location: [facility];  Service: General;  Laterality: N/A;  . LAPAROTOMY N/A 08/10/2016   Procedure: EXPLORATORY LAPAROTOMY small bowel resection abdominal wall resection partial omentectomy;  Surgeon: Excell Seltzer, MD;  Location: [facility];  Service: General;  Laterality: N/A;  . PORTACATH PLACEMENT Right 05/27/2015   Procedure: INSERTION PORT-A-CATH;  Surgeon: [name], MD;  Location: [facility];  Service: General;  Laterality: Right;    FAMILY HISTORY :   Family History  Problem Relation Age of Onset  . Arthritis Mother   . Heart disease Mother   . Stroke Father   . Prostate cancer Neg Hx   . Bladder Cancer Neg Hx     SOCIAL HISTORY:   Social History  Substance Use Topics  . Smoking status: Passive Smoke Exposure - Never Smoker  . Smokeless tobacco: Never Used  . Alcohol use No    ALLERGIES:  is allergic to versed [midazolam]; gabapentin; and paba derivatives.  MEDICATIONS:  Current Outpatient Prescriptions  Medication Sig Dispense Refill  . citalopram (CELEXA) 20 MG tablet Take 1 tablet (20 mg total) by mouth daily. 30 tablet 3  . clindamycin (CLINDAGEL) 1 % gel Apply topically daily. Apply to affected areas once a day. 30 g 0  . diphenoxylate-atropine (LOMOTIL) 2.5-0.025 MG tablet Take 1 tablet by mouth 4 (four) times daily as needed for diarrhea or loose stools. 30 tablet 3  . fentaNYL (DURAGESIC - DOSED MCG/HR) 75 MCG/HR Place 1 patch (75 mcg total) onto the skin every 3 (three)  days. 10 patch 0  . lidocaine-prilocaine (EMLA) cream Apply 1 application topically as needed. Apply small amount to port site at least 1 hour prior to it being accessed, cover with plastic wrap 30 g 1  . lisinopril (PRINIVIL,ZESTRIL) 10 MG tablet Take 10 mg by mouth daily.    Marland Kitchen loperamide (IMODIUM A-D) 2 MG tablet Take 2 mg by mouth 4 (four) times daily as needed for diarrhea or loose stools.    Marland Kitchen LORazepam (ATIVAN) 0.5 MG tablet Take 1 tablet (0.5 mg total) by mouth 2 (two) times daily as needed for anxiety. 60 tablet 0  . minocycline (DYNACIN) 50 MG tablet Take 1 tablet (50 mg total) by mouth 2 (two) times daily. 60 tablet 3  . ondansetron (ZOFRAN) 8 MG tablet Take 1 tablet (8 mg total) by mouth every 8 (eight) hours as needed for nausea or vomiting. 30 tablet 3  . Oxycodone HCl 10 MG TABS Take 1 tablet (10 mg total) by mouth every 6 (six) hours as needed (severe pain). 60 tablet 0  . potassium chloride SA (K-DUR,KLOR-CON) 20 MEQ tablet Take 1 tablet (20 mEq total) by mouth 2 (two) times daily. 60 tablet 3  . prochlorperazine (COMPAZINE) 10 MG tablet Take 1 tablet (10 mg total) by mouth every 6 (six) hours as needed for nausea or  vomiting. 60 tablet 1  . promethazine (PHENERGAN) 25 MG tablet Take 1 tablet (25 mg total) by mouth every 6 (six) hours as needed for nausea or vomiting. 30 tablet 1  . zolpidem (AMBIEN) 10 MG tablet Take 10-15 mg by mouth at bedtime as needed for sleep.     No current facility-administered medications for this visit.    Facility-Administered Medications Ordered in Other Visits  Medication Dose Route Frequency Provider Last Rate Last Dose  . fluorouracil (ADRUCIL) 6,050 mg in sodium chloride 0.9 % 129 mL chemo infusion  2,400 mg/m2 (Treatment Plan Recorded) Intravenous 1 day or 1 dose Cammie Sickle, MD      . irinotecan (CAMPTOSAR) 380 mg in dextrose 5 % 500 mL chemo infusion  150 mg/m2 (Treatment Plan Recorded) Intravenous Once Cammie Sickle, MD 346 mL/hr  at 11/29/16 1228 380 mg at 11/29/16 1228  . leucovorin 1,000 mg in dextrose 5 % 250 mL infusion  1,000 mg Intravenous Once Cammie Sickle, MD 200 mL/hr at 11/29/16 1228 1,000 mg at 11/29/16 1228  . sodium chloride flush (NS) 0.9 % injection 10 mL  10 mL Intravenous PRN Forest Gleason, MD   10 mL at 02/11/16 1326    PHYSICAL EXAMINATION: ECOG PERFORMANCE STATUS: 0 - Asymptomatic  BP 104/73 (BP Location: Left Arm, Patient Position: Sitting)   Pulse (!) 103   Temp 97.9 F (36.6 C) (Tympanic)   Wt 275 lb 2 oz (124.8 kg)   SpO2 98%   BMI 38.37 kg/m   Filed Weights   11/29/16 0853  Weight: 275 lb 2 oz (124.8 kg)    GENERAL: Obese Caucasian male patient. Alert, no distress and comfortable.  Obese. Accompanied by is wife. Walking by himself.  EYES: no pallor or icterus OROPHARYNX: no thrush or ulceration; good dentition  NECK: supple, no masses felt LYMPH:  no palpable lymphadenopathy in the cervical, axillary or inguinal regions LUNGS: clear to auscultation and  No wheeze or crackles HEART/CVS: regular rate & rhythm and no murmurs; No lower extremity edema ABDOMEN:abdomen soft, non-tender and normal bowel sounds; Positive for colostomy. Incisions were healing. Musculoskeletal:no cyanosis of digits and no clubbing  PSYCH: alert & oriented x 3 with fluent speech; anxious/ depressed/tearful NEURO: no focal motor/sensory deficits SKIN:  no rashes or significant lesions  LABORATORY DATA:  I have reviewed the data as listed    Component Value Date/Time   NA 137 11/29/2016 0835   NA 138 01/02/2015 1555   K 3.0 (L) 11/29/2016 0835   K 3.7 01/02/2015 1555   CL 106 11/29/2016 0835   CL 109 01/02/2015 1555   CO2 25 11/29/2016 0835   CO2 22 01/02/2015 1555   GLUCOSE 100 (H) 11/29/2016 0835   GLUCOSE 110 (H) 01/02/2015 1555   BUN 11 11/29/2016 0835   BUN 9 01/02/2015 1555   CREATININE 1.04 11/29/2016 0835   CREATININE 1.04 01/02/2015 1555   CALCIUM 8.3 (L) 11/29/2016 0835    CALCIUM 8.8 (L) 01/02/2015 1555   PROT 6.7 11/29/2016 0835   PROT 8.2 (H) 01/02/2015 1555   ALBUMIN 3.3 (L) 11/29/2016 0835   ALBUMIN 4.0 01/02/2015 1555   AST 26 11/29/2016 0835   AST 39 01/02/2015 1555   ALT 19 11/29/2016 0835   ALT 55 01/02/2015 1555   ALKPHOS 98 11/29/2016 0835   ALKPHOS 125 01/02/2015 1555   BILITOT 0.6 11/29/2016 0835   BILITOT 0.5 01/02/2015 1555   GFRNONAA >60 11/29/2016 0835   GFRNONAA >60 01/02/2015  [place] 11/29/2016 0835   GFRAA >60 01/02/2015 1555    No results found for: SPEP, UPEP  Lab Results  Component Value Date   WBC 4.4 11/29/2016   NEUTROABS 2.2 11/29/2016   HGB 11.0 (L) 11/29/2016   HCT 33.1 (L) 11/29/2016   MCV 81.3 11/29/2016   PLT 218 11/29/2016      Chemistry      Component Value Date/Time   NA 137 11/29/2016 0835   NA 138 01/02/2015 1555   K 3.0 (L) 11/29/2016 0835   K 3.7 01/02/2015 1555   CL 106 11/29/2016 0835   CL 109 01/02/2015 1555   CO2 25 11/29/2016 0835   CO2 22 01/02/2015 1555   BUN 11 11/29/2016 0835   BUN 9 01/02/2015 1555   CREATININE 1.04 11/29/2016 0835   CREATININE 1.04 01/02/2015 1555      Component Value Date/Time   CALCIUM 8.3 (L) 11/29/2016 0835   CALCIUM 8.8 (L) 01/02/2015 1555   ALKPHOS 98 11/29/2016 0835   ALKPHOS 125 01/02/2015 1555   AST 26 11/29/2016 0835   AST 39 01/02/2015 1555   ALT 19 11/29/2016 0835   ALT 55 01/02/2015 1555   BILITOT 0.6 11/29/2016 0835   BILITOT 0.5 01/02/2015 1555     IMPRESSION: 1. Unfortunately there is new metastatic disease to the right lung, extensive metastatic disease to the liver, and metastatic disease to the omentum, peritoneal margin, and mesentery. 2. Other imaging findings of potential clinical significance: Coronary atherosclerosis. 3. Bilateral nonobstructive nephrolithiasis.   Electronically Signed   By: Van Clines M.D.   On: 10/14/2016 13:33  RADIOGRAPHIC STUDIES: I have personally reviewed the radiological images  as listed and agreed with the findings in the report. No results found.   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 PET shows- multiple liver mets/ omental-peritoneal mets; new lung mets. S/p cycle #2 of FOLFIRI [on hold- Vectibex sec to AEs; reduced dose of Iri]; Tolerating better.  # proceed cycle #3 of FOLFIRI today; Labs today reviewed;  acceptable for treatment today- except for- hypokalemia; [see below]  # Sleepiness- cut down on phenergan; continue zofram/ compazine.   # Severe Nausea- ? Anticipatory- improved on ativan/compazine/ zofran/ try to cut down on phenergan.  # diarrhea- G-2-3/ colostomy-improved.   # weight loss/poor apetite- improved;  S/p  Nutrition.stable.   # skin rash- Grade 1. Improved [held vectibex for now]  # pain management/chronic pain-STABLE; Continue  fentanyl 75 g every 72 hours and  oxycodone every 6-8 hours.New prescription given.  # Depression- on celexa 20 mg/day.recommended starting it.   -# Hypokalemia- today 3.2.  patient intolerant to by mouth potassium. supp IV potassium today.  #Follow-up approximately 2 weeks; labs/ chemo. One week labs possible IV fluids/potassium.   Orders Placed This Encounter  Procedures  . Comprehensive metabolic panel    Standing Status:   Standing    Number of Occurrences:   4    Standing Expiration Date:   11/29/2017  . CBC with Differential/Platelet    Standing Status:   Standing    Number of Occurrences:   4    Standing Expiration Date:   11/29/2017  . Magnesium    Standing Status:   Standing    Number of Occurrences:   4    Standing Expiration Date:   11/29/2017  Cammie Sickle, MD 11/29/2016 1:23 PM

## 2016-11-29 NOTE — Progress Notes (Signed)
Nutrition Follow-up:  Nutrition follow-up completed in infusion room this am with patient and wife.  Patient and wife both report that over the last 2 weeks patient has been feeling and eating better.   No nutrition issues reported today.    Medications: reviewed  Labs: K 3.0  Anthropometrics:   Patient weight has increased to 275 pounds 2 oz today from 268 pounds on 3/5.     NUTRITION DIAGNOSIS: Unintentional weight loss improved   MALNUTRITION DIAGNOSIS: Moderate malnutrition improving   INTERVENTION:   Encouraged patient to continue to eat well balanced diet to maintain good nutrition during treatment.   Encouraged continued intake of oral nutrition supplements for additional calories and protein    MONITORING, EVALUATION, GOAL: Patient will consume adequate calories and protein to maintain weight and lean muscle mass   NEXT VISIT: May 21 after MD appointment  Esten Dollar B. Zenia Resides, [place], [place] Registered Dietitian [phone] (pager)

## 2016-11-29 NOTE — Progress Notes (Signed)
Patient here today for follow up.  Patient c/o worsening SOB

## 2016-11-29 NOTE — Progress Notes (Signed)
Patient states, "I am feeling nauseas right now. This usually happens and the doctor orders me more medicine for it." MD, [name], notified via telephone. Per MD verbal order: Ativan 1mg  IV injection once order placed, see MAR. Continue with treatment.

## 2016-12-01 ENCOUNTER — Inpatient Hospital Stay: Payer: BLUE CROSS/BLUE SHIELD

## 2016-12-01 VITALS — BP 138/80 | HR 118

## 2016-12-01 DIAGNOSIS — C186 Malignant neoplasm of descending colon: Secondary | ICD-10-CM

## 2016-12-01 DIAGNOSIS — C772 Secondary and unspecified malignant neoplasm of intra-abdominal lymph nodes: Principal | ICD-10-CM

## 2016-12-01 MED ORDER — HEPARIN SOD (PORK) LOCK FLUSH 100 UNIT/ML IV SOLN
500.0000 [IU] | Freq: Once | INTRAVENOUS | Status: AC | PRN
  Administered 2016-12-01: 500 [IU]

## 2016-12-01 MED ORDER — HEPARIN SOD (PORK) LOCK FLUSH 100 UNIT/ML IV SOLN
INTRAVENOUS | Status: AC
  Filled 2016-12-01: qty 5

## 2016-12-01 MED ORDER — SODIUM CHLORIDE 0.9% FLUSH
10.0000 mL | INTRAVENOUS | Status: DC | PRN
  Administered 2016-12-01: 10 mL
  Filled 2016-12-01: qty 10

## 2016-12-06 ENCOUNTER — Inpatient Hospital Stay: Payer: BLUE CROSS/BLUE SHIELD

## 2016-12-06 ENCOUNTER — Other Ambulatory Visit: Payer: Self-pay | Admitting: Internal Medicine

## 2016-12-06 VITALS — BP 113/78 | HR 92 | Temp 98.0°F | Resp 20

## 2016-12-06 DIAGNOSIS — C772 Secondary and unspecified malignant neoplasm of intra-abdominal lymph nodes: Principal | ICD-10-CM

## 2016-12-06 DIAGNOSIS — C186 Malignant neoplasm of descending colon: Secondary | ICD-10-CM

## 2016-12-06 DIAGNOSIS — R112 Nausea with vomiting, unspecified: Secondary | ICD-10-CM

## 2016-12-06 DIAGNOSIS — E860 Dehydration: Secondary | ICD-10-CM

## 2016-12-06 LAB — CBC WITH DIFFERENTIAL/PLATELET
Basophils Absolute: 0 10*3/uL (ref 0–0.1)
Basophils Relative: 1 %
Eosinophils Absolute: 0.1 10*3/uL (ref 0–0.7)
Eosinophils Relative: 3 %
HEMATOCRIT: 32.7 % — AB (ref 40.0–52.0)
HEMOGLOBIN: 10.7 g/dL — AB (ref 13.0–18.0)
LYMPHS ABS: 1.2 10*3/uL (ref 1.0–3.6)
LYMPHS PCT: 40 %
MCH: 27.2 pg (ref 26.0–34.0)
MCHC: 32.7 g/dL (ref 32.0–36.0)
MCV: 83 fL (ref 80.0–100.0)
MONOS PCT: 6 %
Monocytes Absolute: 0.2 10*3/uL (ref 0.2–1.0)
NEUTROS ABS: 1.5 10*3/uL (ref 1.4–6.5)
NEUTROS PCT: 50 %
Platelets: 196 10*3/uL (ref 150–440)
RBC: 3.94 MIL/uL — AB (ref 4.40–5.90)
RDW: 17.3 % — ABNORMAL HIGH (ref 11.5–14.5)
WBC: 2.9 10*3/uL — AB (ref 3.8–10.6)

## 2016-12-06 LAB — COMPREHENSIVE METABOLIC PANEL
ALBUMIN: 3.2 g/dL — AB (ref 3.5–5.0)
ALT: 18 U/L (ref 17–63)
ANION GAP: 8 (ref 5–15)
AST: 23 U/L (ref 15–41)
Alkaline Phosphatase: 87 U/L (ref 38–126)
BILIRUBIN TOTAL: 0.6 mg/dL (ref 0.3–1.2)
BUN: 8 mg/dL (ref 6–20)
CHLORIDE: 103 mmol/L (ref 101–111)
CO2: 24 mmol/L (ref 22–32)
Calcium: 8 mg/dL — ABNORMAL LOW (ref 8.9–10.3)
Creatinine, Ser: 1.14 mg/dL (ref 0.61–1.24)
Glucose, Bld: 139 mg/dL — ABNORMAL HIGH (ref 65–99)
POTASSIUM: 2.9 mmol/L — AB (ref 3.5–5.1)
Sodium: 135 mmol/L (ref 135–145)
Total Protein: 6.9 g/dL (ref 6.5–8.1)

## 2016-12-06 LAB — MAGNESIUM: MAGNESIUM: 1.8 mg/dL (ref 1.7–2.4)

## 2016-12-06 MED ORDER — SODIUM CHLORIDE 0.9% FLUSH
10.0000 mL | Freq: Once | INTRAVENOUS | Status: AC | PRN
  Administered 2016-12-06: 10 mL
  Filled 2016-12-06: qty 10

## 2016-12-06 MED ORDER — HEPARIN SOD (PORK) LOCK FLUSH 100 UNIT/ML IV SOLN
INTRAVENOUS | Status: AC
  Filled 2016-12-06: qty 5

## 2016-12-06 MED ORDER — SODIUM CHLORIDE 0.9 % IV SOLN
Freq: Once | INTRAVENOUS | Status: AC
  Administered 2016-12-06: 10:00:00 via INTRAVENOUS
  Filled 2016-12-06: qty 1000

## 2016-12-06 MED ORDER — SODIUM CHLORIDE 0.9 % IV SOLN
Freq: Once | INTRAVENOUS | Status: AC
  Administered 2016-12-06: 11:00:00 via INTRAVENOUS
  Filled 2016-12-06: qty 20

## 2016-12-06 MED ORDER — SODIUM CHLORIDE 0.9 % IV SOLN
Freq: Once | INTRAVENOUS | Status: AC
  Administered 2016-12-06: 10:00:00 via INTRAVENOUS
  Filled 2016-12-06: qty 4

## 2016-12-09 ENCOUNTER — Other Ambulatory Visit: Payer: Self-pay | Admitting: *Deleted

## 2016-12-09 MED ORDER — LORAZEPAM 0.5 MG PO TABS: 0.5000 mg | ORAL_TABLET | Freq: Three times a day (TID) | ORAL | 0 refills | Status: DC | PRN

## 2016-12-09 NOTE — Telephone Encounter (Signed)
Requesting refill on Lorazepam, It is a week early, left message on VM regarding this and that we can fill it next week

## 2016-12-09 NOTE — Telephone Encounter (Signed)
Wife called back and stated that he has been taking more than Rx is written for and that he was instructed to take ativan every time he takes zofran for nausea. He does not have enough to last until his return on Monday and is requesting a refill. Please advise if he can have a refill and what the directions should be.

## 2016-12-09 NOTE — Telephone Encounter (Signed)
Wife reports that she was told by Vickki Muff RN to take ativan every time he took a Zofran, Nira Conn denies telling her this. I spoke with Dr B and he confirms that he was not told to take ativan with every Zofran, but has agreed to give enough med until he is seen on Monday and that he can take it every 8 hours if needed.  I explained to her that he is not to take it with every Zofran, her reply was that "it is working so why not "fricking give it to him" She he is going to take it how he wants to anyway, and I explained that if he takes more than 3 a day, he will run out before Monday and will not have any more. She verbalized understanding stating "oh, alright" Prescription called in to [facility]

## 2016-12-09 NOTE — Addendum Note (Signed)
Addended by: Betti Cruz on: 12/09/2016 02:44 PM   Modules accepted: Orders

## 2016-12-13 ENCOUNTER — Inpatient Hospital Stay: Payer: BLUE CROSS/BLUE SHIELD

## 2016-12-13 ENCOUNTER — Inpatient Hospital Stay: Payer: BLUE CROSS/BLUE SHIELD | Attending: Internal Medicine | Admitting: Internal Medicine

## 2016-12-13 VITALS — BP 136/83 | HR 97 | Temp 97.6°F | Ht 71.0 in | Wt 272.6 lb

## 2016-12-13 DIAGNOSIS — R112 Nausea with vomiting, unspecified: Secondary | ICD-10-CM

## 2016-12-13 DIAGNOSIS — Z5111 Encounter for antineoplastic chemotherapy: Secondary | ICD-10-CM | POA: Insufficient documentation

## 2016-12-13 DIAGNOSIS — C772 Secondary and unspecified malignant neoplasm of intra-abdominal lymph nodes: Principal | ICD-10-CM

## 2016-12-13 DIAGNOSIS — R197 Diarrhea, unspecified: Secondary | ICD-10-CM | POA: Diagnosis not present

## 2016-12-13 DIAGNOSIS — R634 Abnormal weight loss: Secondary | ICD-10-CM | POA: Diagnosis not present

## 2016-12-13 DIAGNOSIS — E876 Hypokalemia: Secondary | ICD-10-CM | POA: Insufficient documentation

## 2016-12-13 DIAGNOSIS — M1990 Unspecified osteoarthritis, unspecified site: Secondary | ICD-10-CM | POA: Insufficient documentation

## 2016-12-13 DIAGNOSIS — D869 Sarcoidosis, unspecified: Secondary | ICD-10-CM | POA: Diagnosis not present

## 2016-12-13 DIAGNOSIS — C787 Secondary malignant neoplasm of liver and intrahepatic bile duct: Secondary | ICD-10-CM

## 2016-12-13 DIAGNOSIS — C186 Malignant neoplasm of descending colon: Secondary | ICD-10-CM

## 2016-12-13 DIAGNOSIS — I10 Essential (primary) hypertension: Secondary | ICD-10-CM | POA: Diagnosis not present

## 2016-12-13 DIAGNOSIS — K219 Gastro-esophageal reflux disease without esophagitis: Secondary | ICD-10-CM | POA: Insufficient documentation

## 2016-12-13 DIAGNOSIS — C786 Secondary malignant neoplasm of retroperitoneum and peritoneum: Secondary | ICD-10-CM

## 2016-12-13 DIAGNOSIS — E669 Obesity, unspecified: Secondary | ICD-10-CM | POA: Insufficient documentation

## 2016-12-13 DIAGNOSIS — Z79899 Other long term (current) drug therapy: Secondary | ICD-10-CM | POA: Insufficient documentation

## 2016-12-13 DIAGNOSIS — G893 Neoplasm related pain (acute) (chronic): Secondary | ICD-10-CM | POA: Diagnosis not present

## 2016-12-13 DIAGNOSIS — Z85828 Personal history of other malignant neoplasm of skin: Secondary | ICD-10-CM | POA: Diagnosis not present

## 2016-12-13 DIAGNOSIS — E860 Dehydration: Secondary | ICD-10-CM

## 2016-12-13 DIAGNOSIS — Z7722 Contact with and (suspected) exposure to environmental tobacco smoke (acute) (chronic): Secondary | ICD-10-CM | POA: Insufficient documentation

## 2016-12-13 DIAGNOSIS — Z87442 Personal history of urinary calculi: Secondary | ICD-10-CM | POA: Insufficient documentation

## 2016-12-13 DIAGNOSIS — C7800 Secondary malignant neoplasm of unspecified lung: Secondary | ICD-10-CM | POA: Diagnosis not present

## 2016-12-13 DIAGNOSIS — T451X5A Adverse effect of antineoplastic and immunosuppressive drugs, initial encounter: Secondary | ICD-10-CM

## 2016-12-13 DIAGNOSIS — Z933 Colostomy status: Secondary | ICD-10-CM | POA: Diagnosis not present

## 2016-12-13 LAB — CBC WITH DIFFERENTIAL/PLATELET
BASOS ABS: 0 10*3/uL (ref 0–0.1)
Basophils Relative: 1 %
EOS PCT: 2 %
Eosinophils Absolute: 0.1 10*3/uL (ref 0–0.7)
HCT: 33.1 % — ABNORMAL LOW (ref 40.0–52.0)
HEMOGLOBIN: 11.1 g/dL — AB (ref 13.0–18.0)
LYMPHS PCT: 45 %
Lymphs Abs: 1.3 10*3/uL (ref 1.0–3.6)
MCH: 27.5 pg (ref 26.0–34.0)
MCHC: 33.4 g/dL (ref 32.0–36.0)
MCV: 82.5 fL (ref 80.0–100.0)
Monocytes Absolute: 0.4 10*3/uL (ref 0.2–1.0)
Monocytes Relative: 12 %
NEUTROS ABS: 1.2 10*3/uL — AB (ref 1.4–6.5)
NEUTROS PCT: 40 %
PLATELETS: 275 10*3/uL (ref 150–440)
RBC: 4.02 MIL/uL — ABNORMAL LOW (ref 4.40–5.90)
RDW: 17.6 % — ABNORMAL HIGH (ref 11.5–14.5)
WBC: 2.9 10*3/uL — AB (ref 3.8–10.6)

## 2016-12-13 LAB — COMPREHENSIVE METABOLIC PANEL
ALT: 19 U/L (ref 17–63)
ANION GAP: 8 (ref 5–15)
AST: 30 U/L (ref 15–41)
Albumin: 3.4 g/dL — ABNORMAL LOW (ref 3.5–5.0)
Alkaline Phosphatase: 87 U/L (ref 38–126)
BUN: 9 mg/dL (ref 6–20)
CHLORIDE: 107 mmol/L (ref 101–111)
CO2: 26 mmol/L (ref 22–32)
Calcium: 8.4 mg/dL — ABNORMAL LOW (ref 8.9–10.3)
Creatinine, Ser: 1.05 mg/dL (ref 0.61–1.24)
Glucose, Bld: 98 mg/dL (ref 65–99)
Potassium: 2.9 mmol/L — ABNORMAL LOW (ref 3.5–5.1)
Sodium: 141 mmol/L (ref 135–145)
TOTAL PROTEIN: 6.8 g/dL (ref 6.5–8.1)
Total Bilirubin: 0.5 mg/dL (ref 0.3–1.2)

## 2016-12-13 LAB — MAGNESIUM: Magnesium: 1.6 mg/dL — ABNORMAL LOW (ref 1.7–2.4)

## 2016-12-13 MED ORDER — PALONOSETRON HCL INJECTION 0.25 MG/5ML
0.2500 mg | Freq: Once | INTRAVENOUS | Status: AC
  Administered 2016-12-13: 0.25 mg via INTRAVENOUS
  Filled 2016-12-13: qty 5

## 2016-12-13 MED ORDER — LORAZEPAM 2 MG/ML IJ SOLN
1.0000 mg | Freq: Once | INTRAMUSCULAR | Status: AC
  Administered 2016-12-13: 1 mg via INTRAVENOUS
  Filled 2016-12-13: qty 1

## 2016-12-13 MED ORDER — PROMETHAZINE HCL 25 MG PO TABS: 25.0000 mg | ORAL_TABLET | Freq: Four times a day (QID) | ORAL | 3 refills | Status: DC | PRN

## 2016-12-13 MED ORDER — SODIUM CHLORIDE 0.9 % IV SOLN
2400.0000 mg/m2 | INTRAVENOUS | Status: DC
  Administered 2016-12-13: 6050 mg via INTRAVENOUS
  Filled 2016-12-13: qty 121

## 2016-12-13 MED ORDER — LORAZEPAM 0.5 MG PO TABS: ORAL_TABLET | ORAL | 0 refills | Status: DC

## 2016-12-13 MED ORDER — DEXTROSE 5 % IV SOLN
1000.0000 mg | Freq: Once | INTRAVENOUS | Status: AC
  Administered 2016-12-13: 1000 mg via INTRAVENOUS
  Filled 2016-12-13: qty 50

## 2016-12-13 MED ORDER — POTASSIUM CHLORIDE 2 MEQ/ML IV SOLN
Freq: Once | INTRAVENOUS | Status: AC
  Administered 2016-12-13: 11:00:00 via INTRAVENOUS
  Filled 2016-12-13: qty 20

## 2016-12-13 MED ORDER — FENTANYL 75 MCG/HR TD PT72: 75.0000 ug | MEDICATED_PATCH | TRANSDERMAL | 0 refills | Status: DC

## 2016-12-13 MED ORDER — ATROPINE SULFATE 1 MG/ML IJ SOLN
0.5000 mg | Freq: Once | INTRAMUSCULAR | Status: AC | PRN
  Administered 2016-12-13: 0.5 mg via INTRAVENOUS
  Filled 2016-12-13: qty 1

## 2016-12-13 MED ORDER — OXYCODONE HCL 10 MG PO TABS: 10.0000 mg | ORAL_TABLET | Freq: Four times a day (QID) | ORAL | 0 refills | Status: DC | PRN

## 2016-12-13 MED ORDER — DEXAMETHASONE SODIUM PHOSPHATE 10 MG/ML IJ SOLN
10.0000 mg | Freq: Once | INTRAMUSCULAR | Status: AC
  Administered 2016-12-13: 10 mg via INTRAVENOUS
  Filled 2016-12-13: qty 1

## 2016-12-13 MED ORDER — IRINOTECAN HCL CHEMO INJECTION 100 MG/5ML
150.0000 mg/m2 | Freq: Once | INTRAVENOUS | Status: AC
  Administered 2016-12-13: 380 mg via INTRAVENOUS
  Filled 2016-12-13: qty 15

## 2016-12-13 MED ORDER — SODIUM CHLORIDE 0.9% FLUSH
10.0000 mL | INTRAVENOUS | Status: DC | PRN
  Administered 2016-12-13: 10 mL
  Filled 2016-12-13: qty 10

## 2016-12-13 MED ORDER — SODIUM CHLORIDE 0.9 % IV SOLN
Freq: Once | INTRAVENOUS | Status: AC
  Administered 2016-12-13: 11:00:00 via INTRAVENOUS
  Filled 2016-12-13: qty 1000

## 2016-12-13 NOTE — Progress Notes (Signed)
Patient here for pre treatment check. N/V better. No changes since last appointment.

## 2016-12-13 NOTE — Progress Notes (Signed)
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 Specimen integrity: Intact. Macroscopic tumor perforation: Not identified. Invasive tumor: Maximum size: 5.5 cm. Histologic type(s): Invasive adenocarcinoma. Histologic grade and differentiation: G1: well differentiated/low grade  Pathologic Staging: pT4b, pN1b, pM1b . 2.Patient was started on FOLFOX in September of 2016,after 2 cycles of chemotherapy patient developed neuropathy. Patient had a previous neuropathy as a baseline which increased so was switched over to FOLFIRI I IN December of 2016 As patient has clear os wild-type we can proceed to add either a Avastin or cetuximab from the next chemotherapy.(November, 2016)  Avastin was added as abdominal wound has completely healed now Avastin on hold (December, 2016) because of hypertension Chemotherapy was discontinued after protocol 8 cycle because of significant side effect (last chemotherapy was on October 28, 2015).  # DEC 2017- small bowel obstruction s/p resection [Dr.Hoxworth]- positive for recurrence. 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 ? 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  male  with above history of recurrent metastatic colon cancer to the liver/omentum on palliative chemo- with FOLFIRI  [ vectibex on HOLD ] Cycle 3  approximately 2 weeks ago.  Patient's pain is stable. Not any worse. He continues to take oxycodone and fentanyl patches. His rash has resolved.  He continues to complain of nausea- he has been taking Compazine and Phenergan and Ativan. He has not made taking Zofran. It also causes him drowsiness. Patient feels depressed/anxious.   REVIEW OF SYSTEMS:  A complete 10 point review of system is done which is negative except mentioned above/history of present illness.   PAST MEDICAL HISTORY :  Past Medical History:  Diagnosis Date  . Abdominal pain    mid abdomen  . Adiposity 03/21/2015  . Arthritis   . Besnier-Boeck disease ([place]) 12/21/2012  . Colon cancer ([place])   . Disorder of peripheral nervous system ([place]) 05/28/2014  . Extreme obesity ([place]) 12/21/2012  . GERD (gastroesophageal reflux disease)   . History of kidney stones   . History of transfusion   . Hypertension    has been off BP med x 6 months  . Iron deficiency anemia due to chronic blood loss 05/08/2015  . LBP (low back pain) 05/28/2014  . Neuropathy ([place])   .  PONV (postoperative nausea and vomiting)   . Sarcoid ([place]) 10/2012  . Skin cancer    "it flairs up with sarcoidosis"    PAST SURGICAL HISTORY :   Past Surgical History:  Procedure Laterality  Date  . CARDIAC CATHETERIZATION    . FLEXIBLE SIGMOIDOSCOPY N/A 05/07/2015   Procedure: FLEXIBLE SIGMOIDOSCOPY;  Surgeon: Laurence Spates, MD;  Location: [facility];  Service: Endoscopy;  Laterality: N/A;  . LAPAROSCOPIC PARTIAL COLECTOMY N/A 05/07/2015   Procedure: LAPAROSCOPIC ASSITED PARTIAL COLECTOMY WITH COLOSTOMY, SMALL BOWEL RESECTION, EXCISION OF PERITONEAL NODULE;  Surgeon: [name], MD;  Location: [facility];  Service: General;  Laterality: N/A;  . LAPAROTOMY N/A 08/10/2016   Procedure: EXPLORATORY LAPAROTOMY small bowel resection abdominal wall resection partial omentectomy;  Surgeon: Excell Seltzer, MD;  Location: [facility];  Service: General;  Laterality: N/A;  . PORTACATH PLACEMENT Right 05/27/2015   Procedure: INSERTION PORT-A-CATH;  Surgeon: [name], MD;  Location: [facility];  Service: General;  Laterality: Right;    FAMILY HISTORY :   Family History  Problem Relation Age of Onset  . Arthritis Mother   . Heart disease Mother   . Stroke Father   . Prostate cancer Neg Hx   . Bladder Cancer Neg Hx     SOCIAL HISTORY:   Social History  Substance Use Topics  . Smoking status: Passive Smoke Exposure - Never Smoker  . Smokeless tobacco: Never Used  . Alcohol use No    ALLERGIES:  is allergic to versed [midazolam]; gabapentin; and paba derivatives.  MEDICATIONS:  Current Outpatient Prescriptions  Medication Sig Dispense Refill  . citalopram (CELEXA) 20 MG tablet Take 1 tablet (20 mg total) by mouth daily. 30 tablet 3  . clindamycin (CLINDAGEL) 1 % gel Apply topically daily. Apply to affected areas once a day. 30 g 0  . diphenoxylate-atropine (LOMOTIL) 2.5-0.025 MG tablet Take 1 tablet by mouth 4 (four) times daily as needed for diarrhea or loose stools. 30 tablet 3  . fentaNYL (DURAGESIC - DOSED MCG/HR) 75 MCG/HR Place 1 patch (75 mcg total) onto the skin every 3 (three) days. 10 patch 0  . lidocaine-prilocaine (EMLA) cream Apply 1 application topically as needed.  Apply small amount to port site at least 1 hour prior to it being accessed, cover with plastic wrap 30 g 1  . lisinopril (PRINIVIL,ZESTRIL) 10 MG tablet Take 10 mg by mouth daily.    Marland Kitchen loperamide (IMODIUM A-D) 2 MG tablet Take 2 mg by mouth 4 (four) times daily as needed for diarrhea or loose stools.    Marland Kitchen LORazepam (ATIVAN) 0.5 MG tablet Take every 8 hours as needed for nausea/anxiety. 90 tablet 0  . minocycline (DYNACIN) 50 MG tablet Take 1 tablet (50 mg total) by mouth 2 (two) times daily. 60 tablet 3  . ondansetron (ZOFRAN) 8 MG tablet Take 1 tablet (8 mg total) by mouth every 8 (eight) hours as needed for nausea or vomiting. 30 tablet 3  . Oxycodone HCl 10 MG TABS Take 1 tablet (10 mg total) by mouth every 6 (six) hours as needed (severe pain). 60 tablet 0  . potassium chloride SA (K-DUR,KLOR-CON) 20 MEQ tablet Take 1 tablet (20 mEq total) by mouth 2 (two) times daily. 60 tablet 3  . prochlorperazine (COMPAZINE) 10 MG tablet Take 1 tablet (10 mg total) by mouth every 6 (six) hours as needed for nausea or vomiting. 60 tablet 1  . promethazine (PHENERGAN) 25 MG tablet Take 1 tablet (25 mg total) by mouth  every 6 (six) hours as needed for nausea or vomiting. 30 tablet 3  . zolpidem (AMBIEN) 10 MG tablet Take 10-15 mg by mouth at bedtime as needed for sleep.     No current facility-administered medications for this visit.    Facility-Administered Medications Ordered in Other Visits  Medication Dose Route Frequency Provider Last Rate Last Dose  . sodium chloride flush (NS) 0.9 % injection 10 mL  10 mL Intravenous PRN Forest Gleason, MD   10 mL at 02/11/16 1326    PHYSICAL EXAMINATION: ECOG PERFORMANCE STATUS: 0 - Asymptomatic  BP 136/83 (BP Location: Right Arm, Patient Position: Sitting)   Pulse 97   Temp 97.6 F (36.4 C) (Tympanic)   Ht 5' 11" (1.803 m)   Wt 272 lb 9.6 oz (123.7 kg)   BMI 38.02 kg/m   Filed Weights   12/13/16 0933  Weight: 272 lb 9.6 oz (123.7 kg)    GENERAL: Obese  Caucasian male patient. Alert, no distress and comfortable.  Obese. Accompanied by is wife. Walking by himself.  EYES: no pallor or icterus OROPHARYNX: no thrush or ulceration; good dentition  NECK: supple, no masses felt LYMPH:  no palpable lymphadenopathy in the cervical, axillary or inguinal regions LUNGS: clear to auscultation and  No wheeze or crackles HEART/CVS: regular rate & rhythm and no murmurs; No lower extremity edema ABDOMEN:abdomen soft, non-tender and normal bowel sounds; Positive for colostomy. Incisions were healing. Musculoskeletal:no cyanosis of digits and no clubbing  PSYCH: alert & oriented x 3 with fluent speech; anxious/ depressed/tearful NEURO: no focal motor/sensory deficits SKIN:  no rashes or significant lesions  LABORATORY DATA:  I have reviewed the data as listed    Component Value Date/Time   NA 141 12/13/2016 0905   NA 138 01/02/2015 1555   K 2.9 (L) 12/13/2016 0905   K 3.7 01/02/2015 1555   CL 107 12/13/2016 0905   CL 109 01/02/2015 1555   CO2 26 12/13/2016 0905   CO2 22 01/02/2015 1555   GLUCOSE 98 12/13/2016 0905   GLUCOSE 110 (H) 01/02/2015 1555   BUN 9 12/13/2016 0905   BUN 9 01/02/2015 1555   CREATININE 1.05 12/13/2016 0905   CREATININE 1.04 01/02/2015 1555   CALCIUM 8.4 (L) 12/13/2016 0905   CALCIUM 8.8 (L) 01/02/2015 1555   PROT 6.8 12/13/2016 0905   PROT 8.2 (H) 01/02/2015 1555   ALBUMIN 3.4 (L) 12/13/2016 0905   ALBUMIN 4.0 01/02/2015 1555   AST 30 12/13/2016 0905   AST 39 01/02/2015 1555   ALT 19 12/13/2016 0905   ALT 55 01/02/2015 1555   ALKPHOS 87 12/13/2016 0905   ALKPHOS 125 01/02/2015 1555   BILITOT 0.5 12/13/2016 0905   BILITOT 0.5 01/02/2015 1555   GFRNONAA >60 12/13/2016 0905   GFRNONAA >60 01/02/2015 1555   GFRAA >60 12/13/2016 0905   GFRAA >60 01/02/2015 1555    No results found for: SPEP, UPEP  Lab Results  Component Value Date   WBC 2.9 (L) 12/13/2016   NEUTROABS 1.2 (L) 12/13/2016   HGB 11.1 (L) 12/13/2016    HCT 33.1 (L) 12/13/2016   MCV 82.5 12/13/2016   PLT 275 12/13/2016      Chemistry      Component Value Date/Time   NA 141 12/13/2016 0905   NA 138 01/02/2015 1555   K 2.9 (L) 12/13/2016 0905   K 3.7 01/02/2015 1555   CL 107 12/13/2016 0905   CL 109 01/02/2015 1555   CO2 26 12/13/2016 0905  CO2 22 01/02/2015 1555   BUN 9 12/13/2016 0905   BUN 9 01/02/2015 1555   CREATININE 1.05 12/13/2016 0905   CREATININE 1.04 01/02/2015 1555      Component Value Date/Time   CALCIUM 8.4 (L) 12/13/2016 0905   CALCIUM 8.8 (L) 01/02/2015 1555   ALKPHOS 87 12/13/2016 0905   ALKPHOS 125 01/02/2015 1555   AST 30 12/13/2016 0905   AST 39 01/02/2015 1555   ALT 19 12/13/2016 0905   ALT 55 01/02/2015 1555   BILITOT 0.5 12/13/2016 0905   BILITOT 0.5 01/02/2015 1555     IMPRESSION: 1. Unfortunately there is new metastatic disease to the right lung, extensive metastatic disease to the liver, and metastatic disease to the omentum, peritoneal margin, and mesentery. 2. Other imaging findings of potential clinical significance: Coronary atherosclerosis. 3. Bilateral nonobstructive nephrolithiasis.   Electronically Signed   By: Van Clines M.D.   On: 10/14/2016 13:33  RADIOGRAPHIC STUDIES: I have personally reviewed the radiological images as listed and agreed with the findings in the report. No results found.   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 PET shows- multiple liver mets/ omental-peritoneal mets; new lung mets. S/p cycle #3 of FOLFIRI [on hold- Vectibex sec to AEs; reduced dose of Iri]; Tolerating better. Will plan scan after 6 cycles; pt agrees.   # proceed cycle #4 of FOLFIRI today; Labs today reviewed;  acceptable for treatment today- except for- hypokalemia; [see below]  # Sleepiness- cut down on  phenergan; continue zofram/ compazine.   # Severe Nausea- ? Anticipatory- improved on ativan/compazine/ zofran/ try to cut down on phenergan- at night time.   # diarrhea- G-2 / colostomy-improved; lonotil-imodium/ 2 times day.    # weight loss/poor apetite- improved;  S/p  Nutrition.stable.   # skin rash- Grade 1. Improved [held vectibex for now]  # pain management/chronic pain-STABLE; Continue  fentanyl 75 g every 72 hours and  oxycodone every 6-8 hours.New prescription given.  # Depression- celexa on hold as pt feels; stable.   -# Hypokalemia- today 2.9.  patient intolerant to by mouth potassium. supp IV potassium today.  #Follow-up approximately 2 weeks; labs; CEA/ chemo. One week labs possible IV fluids/potassium.   Orders Placed This Encounter  Procedures  . CEA    Standing Status:   Future    Standing Expiration Date:   12/13/2017       Cammie Sickle, MD 12/14/2016 1:44 PM

## 2016-12-13 NOTE — Progress Notes (Signed)
ANC: 1200. MD, [name], notified via telephone and already aware. Proceed with scheduled treatment today.

## 2016-12-13 NOTE — Assessment & Plan Note (Addendum)
#  Recommend Colon cancer with metastases- peritoneal/omental metastases-status post small bowel resection; liver metastases. PET shows- multiple liver mets/ omental-peritoneal mets; new lung mets. S/p cycle #3 of FOLFIRI [on hold- Vectibex sec to AEs; reduced dose of Iri]; Tolerating better. Will plan scan after 6 cycles; pt agrees.   # proceed cycle #4 of FOLFIRI today; Labs today reviewed;  acceptable for treatment today- except for- hypokalemia; [see below]  # Sleepiness- cut down on phenergan; continue zofram/ compazine.   # Severe Nausea- ? Anticipatory- improved on ativan/compazine/ zofran/ try to cut down on phenergan- at night time.   # diarrhea- G-2 / colostomy-improved; lonotil-imodium/ 2 times day.    # weight loss/poor apetite- improved;  S/p  Nutrition.stable.   # skin rash- Grade 1. Improved [held vectibex for now]  # pain management/chronic pain-STABLE; Continue  fentanyl 75 g every 72 hours and  oxycodone every 6-8 hours.New prescription given.  # Depression- celexa on hold as pt feels; stable.   -# Hypokalemia- today 2.9.  patient intolerant to by mouth potassium. supp IV potassium today.  #Follow-up approximately 2 weeks; labs; CEA/ chemo. One week labs possible IV fluids/potassium.

## 2016-12-15 ENCOUNTER — Inpatient Hospital Stay: Payer: BLUE CROSS/BLUE SHIELD

## 2016-12-15 VITALS — BP 123/83 | HR 96 | Resp 18

## 2016-12-15 DIAGNOSIS — C186 Malignant neoplasm of descending colon: Secondary | ICD-10-CM | POA: Diagnosis not present

## 2016-12-15 DIAGNOSIS — C772 Secondary and unspecified malignant neoplasm of intra-abdominal lymph nodes: Principal | ICD-10-CM

## 2016-12-15 MED ORDER — HEPARIN SOD (PORK) LOCK FLUSH 100 UNIT/ML IV SOLN
INTRAVENOUS | Status: AC
  Filled 2016-12-15: qty 5

## 2016-12-15 MED ORDER — SODIUM CHLORIDE 0.9% FLUSH
10.0000 mL | INTRAVENOUS | Status: DC | PRN
  Administered 2016-12-15: 10 mL
  Filled 2016-12-15: qty 10

## 2016-12-15 MED ORDER — HEPARIN SOD (PORK) LOCK FLUSH 100 UNIT/ML IV SOLN
500.0000 [IU] | Freq: Once | INTRAVENOUS | Status: AC | PRN
  Administered 2016-12-15: 500 [IU]

## 2016-12-21 ENCOUNTER — Inpatient Hospital Stay: Payer: BLUE CROSS/BLUE SHIELD

## 2016-12-21 VITALS — BP 122/73 | HR 91 | Temp 98.4°F | Resp 18

## 2016-12-21 DIAGNOSIS — C772 Secondary and unspecified malignant neoplasm of intra-abdominal lymph nodes: Principal | ICD-10-CM

## 2016-12-21 DIAGNOSIS — C186 Malignant neoplasm of descending colon: Secondary | ICD-10-CM | POA: Diagnosis not present

## 2016-12-21 DIAGNOSIS — R112 Nausea with vomiting, unspecified: Secondary | ICD-10-CM

## 2016-12-21 DIAGNOSIS — E860 Dehydration: Secondary | ICD-10-CM

## 2016-12-21 LAB — CBC WITH DIFFERENTIAL/PLATELET
BASOS PCT: 1 %
Basophils Absolute: 0 10*3/uL (ref 0–0.1)
Eosinophils Absolute: 0.1 10*3/uL (ref 0–0.7)
Eosinophils Relative: 3 %
HEMATOCRIT: 31.9 % — AB (ref 40.0–52.0)
HEMOGLOBIN: 10.8 g/dL — AB (ref 13.0–18.0)
Lymphocytes Relative: 36 %
Lymphs Abs: 1.5 10*3/uL (ref 1.0–3.6)
MCH: 27.9 pg (ref 26.0–34.0)
MCHC: 34 g/dL (ref 32.0–36.0)
MCV: 82.1 fL (ref 80.0–100.0)
Monocytes Absolute: 0.4 10*3/uL (ref 0.2–1.0)
Monocytes Relative: 9 %
NEUTROS ABS: 2.1 10*3/uL (ref 1.4–6.5)
NEUTROS PCT: 51 %
Platelets: 232 10*3/uL (ref 150–440)
RBC: 3.89 MIL/uL — AB (ref 4.40–5.90)
RDW: 17.5 % — AB (ref 11.5–14.5)
WBC: 4.1 10*3/uL (ref 3.8–10.6)

## 2016-12-21 LAB — COMPREHENSIVE METABOLIC PANEL
ALBUMIN: 3.5 g/dL (ref 3.5–5.0)
ALK PHOS: 95 U/L (ref 38–126)
ALT: 25 U/L (ref 17–63)
AST: 29 U/L (ref 15–41)
Anion gap: 8 (ref 5–15)
BILIRUBIN TOTAL: 0.5 mg/dL (ref 0.3–1.2)
BUN: 7 mg/dL (ref 6–20)
CO2: 26 mmol/L (ref 22–32)
Calcium: 8.3 mg/dL — ABNORMAL LOW (ref 8.9–10.3)
Chloride: 104 mmol/L (ref 101–111)
Creatinine, Ser: 0.99 mg/dL (ref 0.61–1.24)
GFR calc Af Amer: >60 mL/min (ref >60)
GFR calc non Af Amer: >60 mL/min (ref >60)
Glucose, Bld: 129 mg/dL — ABNORMAL HIGH (ref 65–99)
POTASSIUM: 2.8 mmol/L — AB (ref 3.5–5.1)
Sodium: 138 mmol/L (ref 135–145)
TOTAL PROTEIN: 7.1 g/dL (ref 6.5–8.1)

## 2016-12-21 LAB — MAGNESIUM: Magnesium: 1.7 mg/dL (ref 1.7–2.4)

## 2016-12-21 MED ORDER — SODIUM CHLORIDE 0.9 % IV SOLN
Freq: Once | INTRAVENOUS | Status: AC
  Administered 2016-12-21: 10:00:00 via INTRAVENOUS
  Filled 2016-12-21: qty 1000

## 2016-12-21 MED ORDER — POTASSIUM CHLORIDE 2 MEQ/ML IV SOLN: Freq: Once | INTRAVENOUS | Status: DC

## 2016-12-21 MED ORDER — HEPARIN SOD (PORK) LOCK FLUSH 100 UNIT/ML IV SOLN
500.0000 [IU] | Freq: Once | INTRAVENOUS | Status: AC
  Administered 2016-12-21: 500 [IU] via INTRAVENOUS
  Filled 2016-12-21: qty 5

## 2016-12-21 MED ORDER — SODIUM CHLORIDE 0.9% FLUSH
10.0000 mL | Freq: Once | INTRAVENOUS | Status: AC
  Administered 2016-12-21: 10 mL via INTRAVENOUS
  Filled 2016-12-21: qty 10

## 2016-12-21 MED ORDER — SODIUM CHLORIDE 0.9 % IV SOLN: Freq: Once | INTRAVENOUS | Status: DC

## 2016-12-22 LAB — CEA: CEA: 89.9 ng/mL — ABNORMAL HIGH (ref 0.0–4.7)

## 2016-12-23 ENCOUNTER — Other Ambulatory Visit: Payer: Self-pay | Admitting: *Deleted

## 2016-12-23 MED ORDER — PROCHLORPERAZINE MALEATE 10 MG PO TABS: 10.0000 mg | ORAL_TABLET | Freq: Four times a day (QID) | ORAL | 1 refills | Status: DC | PRN

## 2016-12-27 ENCOUNTER — Inpatient Hospital Stay (HOSPITAL_BASED_OUTPATIENT_CLINIC_OR_DEPARTMENT_OTHER): Payer: BLUE CROSS/BLUE SHIELD | Admitting: Internal Medicine

## 2016-12-27 ENCOUNTER — Inpatient Hospital Stay: Payer: BLUE CROSS/BLUE SHIELD

## 2016-12-27 VITALS — BP 118/75 | HR 92 | Temp 98.5°F | Resp 16 | Wt 274.2 lb

## 2016-12-27 DIAGNOSIS — C772 Secondary and unspecified malignant neoplasm of intra-abdominal lymph nodes: Secondary | ICD-10-CM | POA: Diagnosis not present

## 2016-12-27 DIAGNOSIS — C786 Secondary malignant neoplasm of retroperitoneum and peritoneum: Secondary | ICD-10-CM | POA: Diagnosis not present

## 2016-12-27 DIAGNOSIS — C7800 Secondary malignant neoplasm of unspecified lung: Secondary | ICD-10-CM

## 2016-12-27 DIAGNOSIS — C787 Secondary malignant neoplasm of liver and intrahepatic bile duct: Secondary | ICD-10-CM

## 2016-12-27 DIAGNOSIS — R197 Diarrhea, unspecified: Secondary | ICD-10-CM

## 2016-12-27 DIAGNOSIS — C186 Malignant neoplasm of descending colon: Secondary | ICD-10-CM

## 2016-12-27 DIAGNOSIS — Z79899 Other long term (current) drug therapy: Secondary | ICD-10-CM

## 2016-12-27 DIAGNOSIS — R634 Abnormal weight loss: Secondary | ICD-10-CM

## 2016-12-27 DIAGNOSIS — Z933 Colostomy status: Secondary | ICD-10-CM

## 2016-12-27 DIAGNOSIS — R112 Nausea with vomiting, unspecified: Secondary | ICD-10-CM

## 2016-12-27 DIAGNOSIS — G893 Neoplasm related pain (acute) (chronic): Secondary | ICD-10-CM

## 2016-12-27 DIAGNOSIS — E860 Dehydration: Secondary | ICD-10-CM

## 2016-12-27 DIAGNOSIS — E876 Hypokalemia: Secondary | ICD-10-CM

## 2016-12-27 LAB — CBC WITH DIFFERENTIAL/PLATELET
BASOS ABS: 0 10*3/uL (ref 0–0.1)
BASOS PCT: 1 %
EOS ABS: 0.1 10*3/uL (ref 0–0.7)
Eosinophils Relative: 2 %
HCT: 31.7 % — ABNORMAL LOW (ref 40.0–52.0)
HEMOGLOBIN: 10.6 g/dL — AB (ref 13.0–18.0)
Lymphocytes Relative: 38 %
Lymphs Abs: 1.4 10*3/uL (ref 1.0–3.6)
MCH: 27.7 pg (ref 26.0–34.0)
MCHC: 33.3 g/dL (ref 32.0–36.0)
MCV: 83.2 fL (ref 80.0–100.0)
Monocytes Absolute: 0.5 10*3/uL (ref 0.2–1.0)
Monocytes Relative: 13 %
NEUTROS PCT: 46 %
Neutro Abs: 1.7 10*3/uL (ref 1.4–6.5)
Platelets: 254 10*3/uL (ref 150–440)
RBC: 3.81 MIL/uL — AB (ref 4.40–5.90)
RDW: 18.8 % — ABNORMAL HIGH (ref 11.5–14.5)
WBC: 3.6 10*3/uL — AB (ref 3.8–10.6)

## 2016-12-27 LAB — COMPREHENSIVE METABOLIC PANEL
ALBUMIN: 3.5 g/dL (ref 3.5–5.0)
ALK PHOS: 82 U/L (ref 38–126)
ALT: 19 U/L (ref 17–63)
ANION GAP: 6 (ref 5–15)
AST: 26 U/L (ref 15–41)
BUN: 8 mg/dL (ref 6–20)
CALCIUM: 8.3 mg/dL — AB (ref 8.9–10.3)
CHLORIDE: 107 mmol/L (ref 101–111)
CO2: 26 mmol/L (ref 22–32)
CREATININE: 1.01 mg/dL (ref 0.61–1.24)
GFR calc non Af Amer: >60 mL/min (ref >60)
GLUCOSE: 123 mg/dL — AB (ref 65–99)
Potassium: 2.8 mmol/L — ABNORMAL LOW (ref 3.5–5.1)
SODIUM: 139 mmol/L (ref 135–145)
TOTAL PROTEIN: 6.8 g/dL (ref 6.5–8.1)
Total Bilirubin: 0.3 mg/dL (ref 0.3–1.2)

## 2016-12-27 LAB — MAGNESIUM: MAGNESIUM: 1.7 mg/dL (ref 1.7–2.4)

## 2016-12-27 MED ORDER — ATROPINE SULFATE 1 MG/ML IJ SOLN
0.5000 mg | Freq: Once | INTRAMUSCULAR | Status: AC | PRN
  Administered 2016-12-27: 0.5 mg via INTRAVENOUS
  Filled 2016-12-27: qty 1

## 2016-12-27 MED ORDER — SODIUM CHLORIDE 0.9% FLUSH
10.0000 mL | Freq: Once | INTRAVENOUS | Status: AC
  Administered 2016-12-27: 10 mL via INTRAVENOUS
  Filled 2016-12-27: qty 10

## 2016-12-27 MED ORDER — PALONOSETRON HCL INJECTION 0.25 MG/5ML
0.2500 mg | Freq: Once | INTRAVENOUS | Status: AC
  Administered 2016-12-27: 0.25 mg via INTRAVENOUS
  Filled 2016-12-27: qty 5

## 2016-12-27 MED ORDER — OXYCODONE HCL 10 MG PO TABS: 10.0000 mg | ORAL_TABLET | Freq: Four times a day (QID) | ORAL | 0 refills | Status: DC | PRN

## 2016-12-27 MED ORDER — SODIUM CHLORIDE 0.9 % IV SOLN
Freq: Once | INTRAVENOUS | Status: AC
Start: 2016-12-27 — End: 2016-12-27
  Administered 2016-12-27: 10:00:00 via INTRAVENOUS
  Filled 2016-12-27: qty 1000

## 2016-12-27 MED ORDER — DEXTROSE 5 % IV SOLN
1000.0000 mg | Freq: Once | INTRAVENOUS | Status: AC
  Administered 2016-12-27: 1000 mg via INTRAVENOUS
  Filled 2016-12-27: qty 50

## 2016-12-27 MED ORDER — HEPARIN SOD (PORK) LOCK FLUSH 100 UNIT/ML IV SOLN: 500.0000 [IU] | Freq: Once | INTRAVENOUS | Status: DC

## 2016-12-27 MED ORDER — SODIUM CHLORIDE 0.9 % IV SOLN
2400.0000 mg/m2 | INTRAVENOUS | Status: DC
  Administered 2016-12-27: 6050 mg via INTRAVENOUS
  Filled 2016-12-27: qty 100

## 2016-12-27 MED ORDER — IRINOTECAN HCL CHEMO INJECTION 100 MG/5ML
150.0000 mg/m2 | Freq: Once | INTRAVENOUS | Status: AC
  Administered 2016-12-27: 380 mg via INTRAVENOUS
  Filled 2016-12-27: qty 15

## 2016-12-27 MED ORDER — SODIUM CHLORIDE 0.9% FLUSH
10.0000 mL | Freq: Once | INTRAVENOUS | Status: AC
Start: 2016-12-27 — End: 2016-12-27
  Administered 2016-12-27: 10 mL via INTRAVENOUS
  Filled 2016-12-27: qty 10

## 2016-12-27 MED ORDER — LORAZEPAM 2 MG/ML IJ SOLN
1.0000 mg | Freq: Once | INTRAMUSCULAR | Status: AC
  Administered 2016-12-27: 1 mg via INTRAVENOUS
  Filled 2016-12-27: qty 1

## 2016-12-27 MED ORDER — SODIUM CHLORIDE 0.9 % IV SOLN
Freq: Once | INTRAVENOUS | Status: AC
  Administered 2016-12-27: 10:00:00 via INTRAVENOUS
  Filled 2016-12-27: qty 20

## 2016-12-27 MED ORDER — DEXAMETHASONE SODIUM PHOSPHATE 10 MG/ML IJ SOLN
10.0000 mg | Freq: Once | INTRAMUSCULAR | Status: AC
  Administered 2016-12-27: 10 mg via INTRAVENOUS
  Filled 2016-12-27: qty 1

## 2016-12-27 NOTE — Assessment & Plan Note (Addendum)
#  Recommend Colon cancer with metastases- peritoneal/omental metastases-status post small bowel resection; liver metastases. Pretreatment- PET shows- multiple liver mets/ omental-peritoneal mets; new lung mets. S/p cycle #4 of FOLFIRI [on hold- Vectibex sec to AEs; reduced dose of Iri]; Tolerating better. Will plan scan after 6 cycles; pt agrees. April 2018 CEA stable around 89.   # proceed cycle #5 of FOLFIRI today; Labs today reviewed;  acceptable for treatment today- except for- hypokalemia; [see below]  # diarrhea- G-2 / colostomy-improved; lonotil-imodium/ 2 times day.    # weight loss/poor apetite- improved;  S/p  Nutrition.stable.   # pain management/chronic pain-STABLE; Continue  fentanyl 75 g every 72 hours and  oxycodone every 6-8 hours.New prescription given.  -# Hypokalemia- today 2.8.  patient intolerant to by mouth potassium. supp IV potassium today.  #Follow-up approximately 2 weeks;. One week labs possible IV fluids/potassium.

## 2016-12-27 NOTE — Progress Notes (Signed)
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 Specimen integrity: Intact. Macroscopic tumor perforation: Not identified. Invasive tumor: Maximum size: 5.5 cm. Histologic type(s): Invasive adenocarcinoma. Histologic grade and differentiation: G1: well differentiated/low grade  Pathologic Staging: pT4b, pN1b, pM1b . 2.Patient was started on FOLFOX in September of 2016,after 2 cycles of chemotherapy patient developed neuropathy. Patient had a previous neuropathy as a baseline which increased so was switched over to FOLFIRI I IN December of 2016 As patient has clear os wild-type we can proceed to add either a Avastin or cetuximab from the next chemotherapy.(November, 2016)  Avastin was added as abdominal wound has completely healed now Avastin on hold (December, 2016) because of hypertension Chemotherapy was discontinued after protocol 8 cycle because of significant side effect (last chemotherapy was on October 28, 2015).  # DEC 2017- small bowel obstruction s/p resection [Dr.Hoxworth]- positive for recurrence. 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 ? 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  male  with above history of recurrent metastatic colon cancer to the liver/omentum on palliative chemo- with FOLFIRI  [ vectibex on HOLD ] Cycle 4  approximately 2 weeks ago.  Patient continues to have intermittent nausea with vomiting. He feels Compazine and Phenergan along with Ativan seems to be helping. He does not want to take Zofran because of "drowsiness".   Patient's pain is stable. Not any worse. He continues to take oxycodone and fentanyl patches. Requesting a refill on his oxycodone.   REVIEW OF SYSTEMS:  A complete 10 point review of system is done which is negative except mentioned above/history of present illness.   PAST MEDICAL HISTORY :  Past Medical History:  Diagnosis Date  . Abdominal pain    mid abdomen  . Adiposity 03/21/2015  . Arthritis   . Besnier-Boeck disease ([place]) 12/21/2012  . Colon cancer ([place])   . Disorder of peripheral nervous system ([place]) 05/28/2014  . Extreme obesity ([place]) 12/21/2012  . GERD (gastroesophageal reflux disease)   . History of kidney stones   . History of transfusion   . Hypertension    has been off BP med x 6 months  . Iron deficiency anemia due to chronic blood loss 05/08/2015  . LBP (low back pain) 05/28/2014  . Neuropathy (  [facility])   . PONV (postoperative nausea and vomiting)   . Sarcoid ([place]) 10/2012  . Skin cancer    "it flairs up with sarcoidosis"    PAST SURGICAL HISTORY :   Past Surgical History:   Procedure Laterality Date  . CARDIAC CATHETERIZATION    . FLEXIBLE SIGMOIDOSCOPY N/A 05/07/2015   Procedure: FLEXIBLE SIGMOIDOSCOPY;  Surgeon: Laurence Spates, MD;  Location: [facility];  Service: Endoscopy;  Laterality: N/A;  . LAPAROSCOPIC PARTIAL COLECTOMY N/A 05/07/2015   Procedure: LAPAROSCOPIC ASSITED PARTIAL COLECTOMY WITH COLOSTOMY, SMALL BOWEL RESECTION, EXCISION OF PERITONEAL NODULE;  Surgeon: [name], MD;  Location: [facility];  Service: General;  Laterality: N/A;  . LAPAROTOMY N/A 08/10/2016   Procedure: EXPLORATORY LAPAROTOMY small bowel resection abdominal wall resection partial omentectomy;  Surgeon: Excell Seltzer, MD;  Location: [facility];  Service: General;  Laterality: N/A;  . PORTACATH PLACEMENT Right 05/27/2015   Procedure: INSERTION PORT-A-CATH;  Surgeon: [name], MD;  Location: [facility];  Service: General;  Laterality: Right;    FAMILY HISTORY :   Family History  Problem Relation Age of Onset  . Arthritis Mother   . Heart disease Mother   . Stroke Father   . Prostate cancer Neg Hx   . Bladder Cancer Neg Hx     SOCIAL HISTORY:   Social History  Substance Use Topics  . Smoking status: Passive Smoke Exposure - Never Smoker  . Smokeless tobacco: Never Used  . Alcohol use No    ALLERGIES:  is allergic to versed [midazolam]; gabapentin; and paba derivatives.  MEDICATIONS:  Current Outpatient Prescriptions  Medication Sig Dispense Refill  . diphenoxylate-atropine (LOMOTIL) 2.5-0.025 MG tablet Take 1 tablet by mouth 4 (four) times daily as needed for diarrhea or loose stools. 30 tablet 3  . fentaNYL (DURAGESIC - DOSED MCG/HR) 75 MCG/HR Place 1 patch (75 mcg total) onto the skin every 3 (three) days. 10 patch 0  . lidocaine-prilocaine (EMLA) cream Apply 1 application topically as needed. Apply small amount to port site at least 1 hour prior to it being accessed, cover with plastic wrap 30 g 1  . loperamide (IMODIUM A-D) 2 MG tablet Take 2 mg by mouth 4  (four) times daily as needed for diarrhea or loose stools.    Marland Kitchen LORazepam (ATIVAN) 0.5 MG tablet Take every 8 hours as needed for nausea/anxiety. 90 tablet 0  . ondansetron (ZOFRAN) 8 MG tablet Take 1 tablet (8 mg total) by mouth every 8 (eight) hours as needed for nausea or vomiting. 30 tablet 3  . Oxycodone HCl 10 MG TABS Take 1 tablet (10 mg total) by mouth every 6 (six) hours as needed (severe pain). 60 tablet 0  . prochlorperazine (COMPAZINE) 10 MG tablet Take 1 tablet (10 mg total) by mouth every 6 (six) hours as needed for nausea or vomiting. 60 tablet 1  . promethazine (PHENERGAN) 25 MG tablet Take 1 tablet (25 mg total) by mouth every 6 (six) hours as needed for nausea or vomiting. 30 tablet 3  . zolpidem (AMBIEN) 10 MG tablet Take 10-15 mg by mouth at bedtime as needed for sleep.    Marland Kitchen lisinopril (PRINIVIL,ZESTRIL) 10 MG tablet Take 10 mg by mouth daily.     No current facility-administered medications for this visit.    Facility-Administered Medications Ordered in Other Visits  Medication Dose Route Frequency Provider Last Rate Last Dose  . fluorouracil (ADRUCIL) 6,050 mg in sodium chloride 0.9 % 129 mL chemo infusion  2,400 mg/m2 (Treatment Plan  Recorded) Intravenous 1 day or 1 dose Cammie Sickle, MD      . heparin lock flush 100 unit/mL  500 Units Intravenous Once Cammie Sickle, MD      . irinotecan (CAMPTOSAR) 380 mg in dextrose 5 % 500 mL chemo infusion  150 mg/m2 (Treatment Plan Recorded) Intravenous Once Cammie Sickle, MD 346 mL/hr at 12/27/16 1218 380 mg at 12/27/16 1218  . leucovorin 1,000 mg in dextrose 5 % 250 mL infusion  1,000 mg Intravenous Once Cammie Sickle, MD 200 mL/hr at 12/27/16 1218 1,000 mg at 12/27/16 1218  . sodium chloride flush (NS) 0.9 % injection 10 mL  10 mL Intravenous PRN Forest Gleason, MD   10 mL at 02/11/16 1326    PHYSICAL EXAMINATION: ECOG PERFORMANCE STATUS: 0 - Asymptomatic  BP 118/75 (BP Location: Left Arm, Patient  Position: Sitting)   Pulse 92   Temp 98.5 F (36.9 C) (Tympanic)   Resp 16   Wt 274 lb 4 oz (124.4 kg)   BMI 38.25 kg/m   Filed Weights   12/27/16 0847  Weight: 274 lb 4 oz (124.4 kg)    GENERAL: Obese Caucasian male patient. Alert, no distress and comfortable.  Obese. Accompanied by is wife. Walking by himself.  EYES: no pallor or icterus OROPHARYNX: no thrush or ulceration; good dentition  NECK: supple, no masses felt LYMPH:  no palpable lymphadenopathy in the cervical, axillary or inguinal regions LUNGS: clear to auscultation and  No wheeze or crackles HEART/CVS: regular rate & rhythm and no murmurs; No lower extremity edema ABDOMEN:abdomen soft, non-tender and normal bowel sounds; Positive for colostomy. Incisions were healing. Musculoskeletal:no cyanosis of digits and no clubbing  PSYCH: alert & oriented x 3 with fluent speech; anxious/ depressed/tearful NEURO: no focal motor/sensory deficits SKIN:  no rashes or significant lesions  LABORATORY DATA:  I have reviewed the data as listed    Component Value Date/Time   NA 139 12/27/2016 0817   NA 138 01/02/2015 1555   K 2.8 (L) 12/27/2016 0817   K 3.7 01/02/2015 1555   CL 107 12/27/2016 0817   CL 109 01/02/2015 1555   CO2 26 12/27/2016 0817   CO2 22 01/02/2015 1555   GLUCOSE 123 (H) 12/27/2016 0817   GLUCOSE 110 (H) 01/02/2015 1555   BUN 8 12/27/2016 0817   BUN 9 01/02/2015 1555   CREATININE 1.01 12/27/2016 0817   CREATININE 1.04 01/02/2015 1555   CALCIUM 8.3 (L) 12/27/2016 0817   CALCIUM 8.8 (L) 01/02/2015 1555   PROT 6.8 12/27/2016 0817   PROT 8.2 (H) 01/02/2015 1555   ALBUMIN 3.5 12/27/2016 0817   ALBUMIN 4.0 01/02/2015 1555   AST 26 12/27/2016 0817   AST 39 01/02/2015 1555   ALT 19 12/27/2016 0817   ALT 55 01/02/2015 1555   ALKPHOS 82 12/27/2016 0817   ALKPHOS 125 01/02/2015 1555   BILITOT 0.3 12/27/2016 0817   BILITOT 0.5 01/02/2015 1555   GFRNONAA >60 12/27/2016 0817   GFRNONAA >60 01/02/2015 1555    GFRAA >60 12/27/2016 0817   GFRAA >60 01/02/2015 1555    No results found for: SPEP, UPEP  Lab Results  Component Value Date   WBC 3.6 (L) 12/27/2016   NEUTROABS 1.7 12/27/2016   HGB 10.6 (L) 12/27/2016   HCT 31.7 (L) 12/27/2016   MCV 83.2 12/27/2016   PLT 254 12/27/2016      Chemistry      Component Value Date/Time   NA 139 12/27/2016 0817  NA 138 01/02/2015 1555   K 2.8 (L) 12/27/2016 0817   K 3.7 01/02/2015 1555   CL 107 12/27/2016 0817   CL 109 01/02/2015 1555   CO2 26 12/27/2016 0817   CO2 22 01/02/2015 1555   BUN 8 12/27/2016 0817   BUN 9 01/02/2015 1555   CREATININE 1.01 12/27/2016 0817   CREATININE 1.04 01/02/2015 1555      Component Value Date/Time   CALCIUM 8.3 (L) 12/27/2016 0817   CALCIUM 8.8 (L) 01/02/2015 1555   ALKPHOS 82 12/27/2016 0817   ALKPHOS 125 01/02/2015 1555   AST 26 12/27/2016 0817   AST 39 01/02/2015 1555   ALT 19 12/27/2016 0817   ALT 55 01/02/2015 1555   BILITOT 0.3 12/27/2016 0817   BILITOT 0.5 01/02/2015 1555     IMPRESSION: 1. Unfortunately there is new metastatic disease to the right lung, extensive metastatic disease to the liver, and metastatic disease to the omentum, peritoneal margin, and mesentery. 2. Other imaging findings of potential clinical significance: Coronary atherosclerosis. 3. Bilateral nonobstructive nephrolithiasis.   Electronically Signed   By: Van Clines M.D.   On: 10/14/2016 13:33  RADIOGRAPHIC STUDIES: I have personally reviewed the radiological images as listed and agreed with the findings in the report. No results found.   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 Pretreatment- PET shows- multiple liver mets/ omental-peritoneal mets; new lung mets. S/p cycle #4 of FOLFIRI [on hold- Vectibex sec to AEs; reduced dose of  Iri]; Tolerating better. Will plan scan after 6 cycles; pt agrees. April 2018 CEA stable around 89.   # proceed cycle #5 of FOLFIRI today; Labs today reviewed;  acceptable for treatment today- except for- hypokalemia; [see below]  # diarrhea- G-2 / colostomy-improved; lonotil-imodium/ 2 times day.    # weight loss/poor apetite- improved;  S/p  Nutrition.stable.   # pain management/chronic pain-STABLE; Continue  fentanyl 75 g every 72 hours and  oxycodone every 6-8 hours.New prescription given.  -# Hypokalemia- today 2.8.  patient intolerant to by mouth potassium. supp IV potassium today.  #Follow-up approximately 2 weeks;. One week labs possible IV fluids/potassium.   Orders Placed This Encounter  Procedures  . CBC with Differential    Standing Status:   Future    Standing Expiration Date:   12/27/2017  . Basic metabolic panel    Standing Status:   Future    Standing Expiration Date:   12/27/2017  . CBC with Differential    Standing Status:   Future    Standing Expiration Date:   12/27/2017  . Comprehensive metabolic panel    Standing Status:   Future    Standing Expiration Date:   12/27/2017  . CEA    Standing Status:   Future    Standing Expiration Date:   12/27/2017       Cammie Sickle, MD 12/27/2016 1:07 PM

## 2016-12-27 NOTE — Progress Notes (Signed)
Patient here today for follow up.   

## 2016-12-29 ENCOUNTER — Inpatient Hospital Stay: Payer: BLUE CROSS/BLUE SHIELD

## 2016-12-29 VITALS — BP 119/79 | HR 77 | Resp 18

## 2016-12-29 DIAGNOSIS — C801 Malignant (primary) neoplasm, unspecified: Secondary | ICD-10-CM

## 2016-12-29 DIAGNOSIS — C186 Malignant neoplasm of descending colon: Secondary | ICD-10-CM | POA: Diagnosis not present

## 2016-12-29 MED ORDER — SODIUM CHLORIDE 0.9% FLUSH
10.0000 mL | INTRAVENOUS | Status: DC | PRN
  Administered 2016-12-29: 10 mL via INTRAVENOUS
  Filled 2016-12-29: qty 10

## 2016-12-29 MED ORDER — HEPARIN SOD (PORK) LOCK FLUSH 100 UNIT/ML IV SOLN
500.0000 [IU] | Freq: Once | INTRAVENOUS | Status: AC
  Administered 2016-12-29: 500 [IU] via INTRAVENOUS
  Filled 2016-12-29: qty 5

## 2016-12-29 NOTE — Progress Notes (Signed)
Patient complains of mild indigestion since last night, not relieve by Tums.  Called [name] and he instructed patient to take Prilosec OTC two times a day to relieve symptoms.

## 2016-12-31 ENCOUNTER — Telehealth: Payer: Self-pay | Admitting: *Deleted

## 2016-12-31 MED ORDER — SUCRALFATE 1 G PO TABS: 1.0000 g | ORAL_TABLET | Freq: Four times a day (QID) | ORAL | 1 refills | Status: DC

## 2016-12-31 NOTE — Telephone Encounter (Signed)
Per VO Dr Rogue Bussing, Carafate 1 Gm qid e scribed. Roxanne notified

## 2016-12-31 NOTE — Telephone Encounter (Signed)
Called to report that taking Prilosec BID has not helped with the heartburn he has been having, in fact, it is worse. Asking that a prescription for something stronger be sent to pharmacy for him. Please advise.

## 2017-01-03 ENCOUNTER — Inpatient Hospital Stay: Payer: BLUE CROSS/BLUE SHIELD

## 2017-01-03 VITALS — BP 106/72 | HR 67 | Temp 97.1°F | Resp 18

## 2017-01-03 DIAGNOSIS — C186 Malignant neoplasm of descending colon: Secondary | ICD-10-CM | POA: Diagnosis not present

## 2017-01-03 DIAGNOSIS — R112 Nausea with vomiting, unspecified: Secondary | ICD-10-CM

## 2017-01-03 DIAGNOSIS — E860 Dehydration: Secondary | ICD-10-CM

## 2017-01-03 DIAGNOSIS — C772 Secondary and unspecified malignant neoplasm of intra-abdominal lymph nodes: Principal | ICD-10-CM

## 2017-01-03 LAB — BASIC METABOLIC PANEL
Anion gap: 6 (ref 5–15)
BUN: 10 mg/dL (ref 6–20)
CALCIUM: 8 mg/dL — AB (ref 8.9–10.3)
CHLORIDE: 105 mmol/L (ref 101–111)
CO2: 27 mmol/L (ref 22–32)
CREATININE: 0.99 mg/dL (ref 0.61–1.24)
Glucose, Bld: 117 mg/dL — ABNORMAL HIGH (ref 65–99)
Potassium: 3.2 mmol/L — ABNORMAL LOW (ref 3.5–5.1)
SODIUM: 138 mmol/L (ref 135–145)

## 2017-01-03 LAB — CBC WITH DIFFERENTIAL/PLATELET
BASOS PCT: 1 %
Basophils Absolute: 0 10*3/uL (ref 0–0.1)
Eosinophils Absolute: 0 10*3/uL (ref 0–0.7)
Eosinophils Relative: 2 %
HCT: 29.6 % — ABNORMAL LOW (ref 40.0–52.0)
HEMOGLOBIN: 10.1 g/dL — AB (ref 13.0–18.0)
Lymphocytes Relative: 54 %
Lymphs Abs: 1.3 10*3/uL (ref 1.0–3.6)
MCH: 28 pg (ref 26.0–34.0)
MCHC: 34 g/dL (ref 32.0–36.0)
MCV: 82.3 fL (ref 80.0–100.0)
MONOS PCT: 7 %
Monocytes Absolute: 0.2 10*3/uL (ref 0.2–1.0)
NEUTROS PCT: 36 %
Neutro Abs: 0.8 10*3/uL — ABNORMAL LOW (ref 1.4–6.5)
Platelets: 176 10*3/uL (ref 150–440)
RBC: 3.6 MIL/uL — ABNORMAL LOW (ref 4.40–5.90)
RDW: 18 % — ABNORMAL HIGH (ref 11.5–14.5)
WBC: 2.3 10*3/uL — ABNORMAL LOW (ref 3.8–10.6)

## 2017-01-03 LAB — MAGNESIUM: Magnesium: 1.8 mg/dL (ref 1.7–2.4)

## 2017-01-03 MED ORDER — SODIUM CHLORIDE 0.9 % IV SOLN
INTRAVENOUS | Status: DC
  Administered 2017-01-03: 10:00:00 via INTRAVENOUS
  Filled 2017-01-03: qty 1000

## 2017-01-03 MED ORDER — SODIUM CHLORIDE 0.9 % IV SOLN
Freq: Once | INTRAVENOUS | Status: AC
  Administered 2017-01-03: 10:00:00 via INTRAVENOUS
  Filled 2017-01-03: qty 20

## 2017-01-03 MED ORDER — SODIUM CHLORIDE 0.9% FLUSH
10.0000 mL | Freq: Once | INTRAVENOUS | Status: AC
Start: 2017-01-03 — End: 2017-01-03
  Administered 2017-01-03: 10 mL via INTRAVENOUS
  Filled 2017-01-03: qty 10

## 2017-01-03 MED ORDER — SODIUM CHLORIDE 0.9% FLUSH
10.0000 mL | Freq: Once | INTRAVENOUS | Status: AC
  Administered 2017-01-03: 10 mL via INTRAVENOUS
  Filled 2017-01-03: qty 10

## 2017-01-03 MED ORDER — HEPARIN SOD (PORK) LOCK FLUSH 100 UNIT/ML IV SOLN
500.0000 [IU] | Freq: Once | INTRAVENOUS | Status: AC
  Administered 2017-01-03: 500 [IU] via INTRAVENOUS
  Filled 2017-01-03: qty 5

## 2017-01-10 ENCOUNTER — Inpatient Hospital Stay: Payer: BLUE CROSS/BLUE SHIELD

## 2017-01-10 ENCOUNTER — Inpatient Hospital Stay (HOSPITAL_BASED_OUTPATIENT_CLINIC_OR_DEPARTMENT_OTHER): Payer: BLUE CROSS/BLUE SHIELD | Admitting: Internal Medicine

## 2017-01-10 VITALS — BP 142/84 | HR 94 | Temp 97.5°F | Resp 20 | Ht 71.0 in | Wt 273.0 lb

## 2017-01-10 DIAGNOSIS — C787 Secondary malignant neoplasm of liver and intrahepatic bile duct: Secondary | ICD-10-CM | POA: Diagnosis not present

## 2017-01-10 DIAGNOSIS — G893 Neoplasm related pain (acute) (chronic): Secondary | ICD-10-CM

## 2017-01-10 DIAGNOSIS — E876 Hypokalemia: Secondary | ICD-10-CM

## 2017-01-10 DIAGNOSIS — Z79899 Other long term (current) drug therapy: Secondary | ICD-10-CM | POA: Diagnosis not present

## 2017-01-10 DIAGNOSIS — R112 Nausea with vomiting, unspecified: Secondary | ICD-10-CM

## 2017-01-10 DIAGNOSIS — C772 Secondary and unspecified malignant neoplasm of intra-abdominal lymph nodes: Secondary | ICD-10-CM

## 2017-01-10 DIAGNOSIS — C786 Secondary malignant neoplasm of retroperitoneum and peritoneum: Secondary | ICD-10-CM

## 2017-01-10 DIAGNOSIS — R197 Diarrhea, unspecified: Secondary | ICD-10-CM

## 2017-01-10 DIAGNOSIS — C186 Malignant neoplasm of descending colon: Secondary | ICD-10-CM

## 2017-01-10 DIAGNOSIS — Z933 Colostomy status: Secondary | ICD-10-CM

## 2017-01-10 DIAGNOSIS — E860 Dehydration: Secondary | ICD-10-CM

## 2017-01-10 DIAGNOSIS — R634 Abnormal weight loss: Secondary | ICD-10-CM

## 2017-01-10 DIAGNOSIS — C7800 Secondary malignant neoplasm of unspecified lung: Secondary | ICD-10-CM

## 2017-01-10 LAB — COMPREHENSIVE METABOLIC PANEL
ALT: 16 U/L — ABNORMAL LOW (ref 17–63)
AST: 23 U/L (ref 15–41)
Albumin: 3.3 g/dL — ABNORMAL LOW (ref 3.5–5.0)
Alkaline Phosphatase: 96 U/L (ref 38–126)
Anion gap: 7 (ref 5–15)
BUN: 10 mg/dL (ref 6–20)
CHLORIDE: 108 mmol/L (ref 101–111)
CO2: 25 mmol/L (ref 22–32)
Calcium: 8.4 mg/dL — ABNORMAL LOW (ref 8.9–10.3)
Creatinine, Ser: 0.83 mg/dL (ref 0.61–1.24)
Glucose, Bld: 119 mg/dL — ABNORMAL HIGH (ref 65–99)
POTASSIUM: 3 mmol/L — AB (ref 3.5–5.1)
SODIUM: 140 mmol/L (ref 135–145)
Total Bilirubin: 0.3 mg/dL (ref 0.3–1.2)
Total Protein: 6.7 g/dL (ref 6.5–8.1)

## 2017-01-10 LAB — CBC WITH DIFFERENTIAL/PLATELET
BASOS ABS: 0 10*3/uL (ref 0–0.1)
Basophils Relative: 1 %
EOS ABS: 0.1 10*3/uL (ref 0–0.7)
EOS PCT: 2 %
HCT: 30.5 % — ABNORMAL LOW (ref 40.0–52.0)
Hemoglobin: 10.3 g/dL — ABNORMAL LOW (ref 13.0–18.0)
LYMPHS PCT: 35 %
Lymphs Abs: 1.2 10*3/uL (ref 1.0–3.6)
MCH: 28.1 pg (ref 26.0–34.0)
MCHC: 33.7 g/dL (ref 32.0–36.0)
MCV: 83.3 fL (ref 80.0–100.0)
MONO ABS: 0.5 10*3/uL (ref 0.2–1.0)
Monocytes Relative: 15 %
Neutro Abs: 1.6 10*3/uL (ref 1.4–6.5)
Neutrophils Relative %: 47 %
PLATELETS: 243 10*3/uL (ref 150–440)
RBC: 3.66 MIL/uL — ABNORMAL LOW (ref 4.40–5.90)
RDW: 18.2 % — AB (ref 11.5–14.5)
WBC: 3.4 10*3/uL — AB (ref 3.8–10.6)

## 2017-01-10 MED ORDER — PALONOSETRON HCL INJECTION 0.25 MG/5ML
0.2500 mg | Freq: Once | INTRAVENOUS | Status: AC
  Administered 2017-01-10: 0.25 mg via INTRAVENOUS
  Filled 2017-01-10: qty 5

## 2017-01-10 MED ORDER — SODIUM CHLORIDE 0.9% FLUSH
10.0000 mL | Freq: Once | INTRAVENOUS | Status: AC
  Administered 2017-01-10: 10 mL via INTRAVENOUS
  Filled 2017-01-10: qty 10

## 2017-01-10 MED ORDER — DEXAMETHASONE SODIUM PHOSPHATE 10 MG/ML IJ SOLN
10.0000 mg | Freq: Once | INTRAMUSCULAR | Status: AC
  Administered 2017-01-10: 10 mg via INTRAVENOUS
  Filled 2017-01-10: qty 1

## 2017-01-10 MED ORDER — HEPARIN SOD (PORK) LOCK FLUSH 100 UNIT/ML IV SOLN: 500.0000 [IU] | Freq: Once | INTRAVENOUS | Status: DC

## 2017-01-10 MED ORDER — ATROPINE SULFATE 1 MG/ML IJ SOLN
0.5000 mg | Freq: Once | INTRAMUSCULAR | Status: AC | PRN
  Administered 2017-01-10: 0.5 mg via INTRAVENOUS
  Filled 2017-01-10: qty 1

## 2017-01-10 MED ORDER — SODIUM CHLORIDE 0.9 % IV SOLN
Freq: Once | INTRAVENOUS | Status: AC
  Administered 2017-01-10: 10:00:00 via INTRAVENOUS
  Filled 2017-01-10: qty 1000

## 2017-01-10 MED ORDER — DEXTROSE 5 % IV SOLN
1000.0000 mg | Freq: Once | INTRAVENOUS | Status: AC
  Administered 2017-01-10: 1000 mg via INTRAVENOUS
  Filled 2017-01-10: qty 50

## 2017-01-10 MED ORDER — FENTANYL 75 MCG/HR TD PT72
75.0000 ug | MEDICATED_PATCH | TRANSDERMAL | 0 refills | Status: DC
Start: 2017-01-10 — End: 2017-02-16

## 2017-01-10 MED ORDER — SODIUM CHLORIDE 0.9 % IV SOLN
Freq: Once | INTRAVENOUS | Status: AC
  Administered 2017-01-10: 10:00:00 via INTRAVENOUS
  Filled 2017-01-10: qty 20

## 2017-01-10 MED ORDER — LORAZEPAM 2 MG/ML IJ SOLN
1.0000 mg | Freq: Once | INTRAMUSCULAR | Status: AC
  Administered 2017-01-10: 1 mg via INTRAVENOUS
  Filled 2017-01-10: qty 1

## 2017-01-10 MED ORDER — IRINOTECAN HCL CHEMO INJECTION 100 MG/5ML
150.0000 mg/m2 | Freq: Once | INTRAVENOUS | Status: AC
  Administered 2017-01-10: 380 mg via INTRAVENOUS
  Filled 2017-01-10: qty 15

## 2017-01-10 MED ORDER — OXYCODONE HCL 10 MG PO TABS: 10.0000 mg | ORAL_TABLET | Freq: Four times a day (QID) | ORAL | 0 refills | Status: DC | PRN

## 2017-01-10 MED ORDER — SODIUM CHLORIDE 0.9 % IV SOLN
2400.0000 mg/m2 | INTRAVENOUS | Status: DC
  Administered 2017-01-10: 6050 mg via INTRAVENOUS
  Filled 2017-01-10: qty 101

## 2017-01-10 NOTE — Progress Notes (Signed)
Nutrition Follow-up:  Nutrition follow-up completed in infusion this am with wife at chairside.  Patient and wife report that patient is eating better.  Patient receiving last chemotherapy today then CT scan to determine next steps.  Patient reports no vomiting but some nausea but controlled with medication.  Reports some diarrhea at times but controlled with imodium.  Wife reports patient generally does not feel well 2-3 days after chemotherapy.  Reports last Friday and Saturday mowed the grass.    Patient reports eating chicken biscuit, or eggs and bacon for breakfast, usually has potatoes of some sort and meat (chicken, steak, etc).  Likes pizza.  Wife reports, " I feed him whatever he feels like eating."  Medications: reviewed  Labs: K 3.0  Anthropometrics:   Weight today 273 lb decreased 2 pounds from weight of 275 lb on 3/19.  Overall weight fairly stable since early March.   NUTRITION DIAGNOSIS: Unintentional weight loss stable   MALNUTRITION DIAGNOSIS: Moderate malnutrition improving   INTERVENTION:   Continue to encourage good sources of protein and overall well balanced diet.   Discussed foods that are high in potassium and foods that will help thicken stool (patient with ostomy) Patient and wife appreciative of visit.    MONITORING, EVALUATION, GOAL: patient will consume adequate calories and protein to maintain weight and lean muscle mass   NEXT VISIT: as needed.    Emran Molzahn B. Zenia Resides, [place], [place] Registered Dietitian [phone] (pager)

## 2017-01-10 NOTE — Assessment & Plan Note (Addendum)
#    Colon cancer with metastases- peritoneal/omental metastases-status post small bowel resection; liver metastases. Pretreatment- PET shows- multiple liver mets/ omental-peritoneal mets; new lung mets. S/p cycle #5 of FOLFIRI. #  April 2018 CEA stable around 89/today. Awaiting CEA from today  # proceed cycle #6 of FOLFIRI today; Labs today reviewed;  acceptable for treatment today- except for- hypokalemia; [see below]  # diarrhea- G-2 / colostomy-improved; lonotil-imodium prn. .    # Heart burn- s/p carafate- improved.    # pain management/chronic pain-STABLE; Continue  fentanyl 75 g every 72 hours and  oxycodone every 6-8 hours.New prescription given.  -# Hypokalemia- today 3.0.  patient intolerant to by mouth potassium. supp IV potassium today.  #Follow-up approximately 2 weeks;CT scans prior. One week labs possible IV fluids/potassium

## 2017-01-10 NOTE — Progress Notes (Signed)
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 Specimen integrity: Intact. Macroscopic tumor perforation: Not identified. Invasive tumor: Maximum size: 5.5 cm. Histologic type(s): Invasive adenocarcinoma. Histologic grade and differentiation: G1: well differentiated/low grade  Pathologic Staging: pT4b, pN1b, pM1b . 2.Patient was started on FOLFOX in September of 2016,after 2 cycles of chemotherapy patient developed neuropathy. Patient had a previous neuropathy as a baseline which increased so was switched over to FOLFIRI I IN December of 2016 As patient has clear os wild-type we can proceed to add either a Avastin or cetuximab from the next chemotherapy.(November, 2016)  Avastin was added as abdominal wound has completely healed now Avastin on hold (December, 2016) because of hypertension Chemotherapy was discontinued after protocol 8 cycle because of significant side effect (last chemotherapy was on October 28, 2015).  # DEC 2017- small bowel obstruction s/p resection [Dr.Hoxworth]- positive for recurrence. 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 ? 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  male  with above history of recurrent metastatic colon cancer to the liver/omentum on palliative chemo- with FOLFIRI  Cycle 5  approximately 2 weeks ago.  Patient denies any significant nausea vomiting. Patient had reflux symptoms which did not improve with Prilosec. His currently improved on Carafate. Pain is improved. He continues to take oxycodone and fentanyl patches. Mild sores in the mouth. Improved. Has mild diarrhea continues to take Imodium/Lomotil. No weight loss.  REVIEW OF SYSTEMS:  A complete 10 point review of system is done which is negative except mentioned above/history of present illness.   PAST MEDICAL HISTORY :  Past Medical History:  Diagnosis Date  . Abdominal pain    mid abdomen  . Adiposity 03/21/2015  . Arthritis   . Besnier-Boeck disease ([place]) 12/21/2012  . Colon cancer ([place])   . 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 Extreme obesity ([place]) 12/21/2012  . GERD (gastroesophageal reflux disease)   . History of kidney stones   . History of transfusion   . Hypertension    has been off BP med x 6 months  . Iron deficiency anemia due to chronic blood loss 05/08/2015  . LBP (low back pain) 05/28/2014  . Neuropathy ([place])   . PONV (postoperative nausea and vomiting)   .  Sarcoid ([place]) 10/2012  . Skin cancer    "it flairs up with sarcoidosis"    PAST SURGICAL HISTORY :   Past Surgical History:  Procedure Laterality Date   . CARDIAC CATHETERIZATION    . FLEXIBLE SIGMOIDOSCOPY N/A 05/07/2015   Procedure: FLEXIBLE SIGMOIDOSCOPY;  Surgeon: Laurence Spates, MD;  Location: [facility];  Service: Endoscopy;  Laterality: N/A;  . LAPAROSCOPIC PARTIAL COLECTOMY N/A 05/07/2015   Procedure: LAPAROSCOPIC ASSITED PARTIAL COLECTOMY WITH COLOSTOMY, SMALL BOWEL RESECTION, EXCISION OF PERITONEAL NODULE;  Surgeon: [name], MD;  Location: [facility];  Service: General;  Laterality: N/A;  . LAPAROTOMY N/A 08/10/2016   Procedure: EXPLORATORY LAPAROTOMY small bowel resection abdominal wall resection partial omentectomy;  Surgeon: Excell Seltzer, MD;  Location: [facility];  Service: General;  Laterality: N/A;  . PORTACATH PLACEMENT Right 05/27/2015   Procedure: INSERTION PORT-A-CATH;  Surgeon: [name], MD;  Location: [facility];  Service: General;  Laterality: Right;    FAMILY HISTORY :   Family History  Problem Relation Age of Onset  . Arthritis Mother   . Heart disease Mother   . Stroke Father   . Prostate cancer Neg Hx   . Bladder Cancer Neg Hx     SOCIAL HISTORY:   Social History  Substance Use Topics  . Smoking status: Passive Smoke Exposure - Never Smoker  . Smokeless tobacco: Never Used  . Alcohol use No    ALLERGIES:  is allergic to versed [midazolam]; gabapentin; and paba derivatives.  MEDICATIONS:  Current Outpatient Prescriptions  Medication Sig Dispense Refill  . diphenoxylate-atropine (LOMOTIL) 2.5-0.025 MG tablet Take 1 tablet by mouth 4 (four) times daily as needed for diarrhea or loose stools. 30 tablet 3  . fentaNYL (DURAGESIC - DOSED MCG/HR) 75 MCG/HR Place 1 patch (75 mcg total) onto the skin every 3 (three) days. 10 patch 0  . lidocaine-prilocaine (EMLA) cream Apply 1 application topically as needed. Apply small amount to port site at least 1 hour prior to it being accessed, cover with plastic wrap 30 g 1  . loperamide (IMODIUM A-D) 2 MG tablet Take 2 mg by mouth 4 (four) times daily as needed  for diarrhea or loose stools.    Marland Kitchen LORazepam (ATIVAN) 0.5 MG tablet Take every 8 hours as needed for nausea/anxiety. 90 tablet 0  . ondansetron (ZOFRAN) 8 MG tablet Take 1 tablet (8 mg total) by mouth every 8 (eight) hours as needed for nausea or vomiting. 30 tablet 3  . Oxycodone HCl 10 MG TABS Take 1 tablet (10 mg total) by mouth every 6 (six) hours as needed (severe pain). 60 tablet 0  . prochlorperazine (COMPAZINE) 10 MG tablet Take 1 tablet (10 mg total) by mouth every 6 (six) hours as needed for nausea or vomiting. 60 tablet 1  . sucralfate (CARAFATE) 1 g tablet Take 1 tablet (1 g total) by mouth 4 (four) times daily. 120 tablet 1  . zolpidem (AMBIEN) 10 MG tablet Take 10-15 mg by mouth at bedtime as needed for sleep.     No current facility-administered medications for this visit.    Facility-Administered Medications Ordered in Other Visits  Medication Dose Route Frequency Provider Last Rate Last Dose  . sodium chloride flush (NS) 0.9 % injection 10 mL  10 mL Intravenous PRN Forest Gleason, MD   10 mL at 02/11/16 1326    PHYSICAL EXAMINATION: ECOG PERFORMANCE STATUS: 0 - Asymptomatic  BP (!) 142/84 (BP Location: Left Arm, Patient Position: Sitting)   Pulse 94  Temp 97.5 F (36.4 C) (Tympanic)   Resp 20   Ht 5' 11"  (1.803 m)   Wt 273 lb (123.8 kg)   BMI 38.08 kg/m   Filed Weights   01/10/17 0846  Weight: 273 lb (123.8 kg)    GENERAL: Obese Caucasian male patient. Alert, no distress and comfortable.  Obese. Accompanied by is wife. Walking by himself.  EYES: no pallor or icterus OROPHARYNX: no thrush or ulceration; good dentition  NECK: supple, no masses felt LYMPH:  no palpable lymphadenopathy in the cervical, axillary or inguinal regions LUNGS: clear to auscultation and  No wheeze or crackles HEART/CVS: regular rate & rhythm and no murmurs; No lower extremity edema ABDOMEN:abdomen soft, non-tender and normal bowel sounds; Positive for colostomy. Incisions were  healing. Musculoskeletal:no cyanosis of digits and no clubbing  PSYCH: alert & oriented x 3 with fluent speech; anxious/ depressed/tearful NEURO: no focal motor/sensory deficits SKIN:  no rashes or significant lesions  LABORATORY DATA:  I have reviewed the data as listed    Component Value Date/Time   NA 140 01/10/2017 0830   NA 138 01/02/2015 1555   K 3.0 (L) 01/10/2017 0830   K 3.7 01/02/2015 1555   CL 108 01/10/2017 0830   CL 109 01/02/2015 1555   CO2 25 01/10/2017 0830   CO2 22 01/02/2015 1555   GLUCOSE 119 (H) 01/10/2017 0830   GLUCOSE 110 (H) 01/02/2015 1555   BUN 10 01/10/2017 0830   BUN 9 01/02/2015 1555   CREATININE 0.83 01/10/2017 0830   CREATININE 1.04 01/02/2015 1555   CALCIUM 8.4 (L) 01/10/2017 0830   CALCIUM 8.8 (L) 01/02/2015 1555   PROT 6.7 01/10/2017 0830   PROT 8.2 (H) 01/02/2015 1555   ALBUMIN 3.3 (L) 01/10/2017 0830   ALBUMIN 4.0 01/02/2015 1555   AST 23 01/10/2017 0830   AST 39 01/02/2015 1555   ALT 16 (L) 01/10/2017 0830   ALT 55 01/02/2015 1555   ALKPHOS 96 01/10/2017 0830   ALKPHOS 125 01/02/2015 1555   BILITOT 0.3 01/10/2017 0830   BILITOT 0.5 01/02/2015 1555   GFRNONAA >60 01/10/2017 0830   GFRNONAA >60 01/02/2015 1555   GFRAA >60 01/10/2017 0830   GFRAA >60 01/02/2015 1555    No results found for: SPEP, UPEP  Lab Results  Component Value Date   WBC 3.4 (L) 01/10/2017   NEUTROABS 1.6 01/10/2017   HGB 10.3 (L) 01/10/2017   HCT 30.5 (L) 01/10/2017   MCV 83.3 01/10/2017   PLT 243 01/10/2017      Chemistry      Component Value Date/Time   NA 140 01/10/2017 0830   NA 138 01/02/2015 1555   K 3.0 (L) 01/10/2017 0830   K 3.7 01/02/2015 1555   CL 108 01/10/2017 0830   CL 109 01/02/2015 1555   CO2 25 01/10/2017 0830   CO2 22 01/02/2015 1555   BUN 10 01/10/2017 0830   BUN 9 01/02/2015 1555   CREATININE 0.83 01/10/2017 0830   CREATININE 1.04 01/02/2015 1555      Component Value Date/Time   CALCIUM 8.4 (L) 01/10/2017 0830    CALCIUM 8.8 (L) 01/02/2015 1555   ALKPHOS 96 01/10/2017 0830   ALKPHOS 125 01/02/2015 1555   AST 23 01/10/2017 0830   AST 39 01/02/2015 1555   ALT 16 (L) 01/10/2017 0830   ALT 55 01/02/2015 1555   BILITOT 0.3 01/10/2017 0830   BILITOT 0.5 01/02/2015 1555     IMPRESSION: 1. Unfortunately there is new metastatic disease  to the right lung, extensive metastatic disease to the liver, and metastatic disease to the omentum, peritoneal margin, and mesentery. 2. Other imaging findings of potential clinical significance: Coronary atherosclerosis. 3. Bilateral nonobstructive nephrolithiasis.   Electronically Signed   By: Van Clines M.D.   On: 10/14/2016 13:33  RADIOGRAPHIC STUDIES: I have personally reviewed the radiological images as listed and agreed with the findings in the report. No results found.   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 Pretreatment- PET shows- multiple liver mets/ omental-peritoneal mets; new lung mets. S/p cycle #5 of FOLFIRI. #  April 2018 CEA stable around 89/today. Awaiting CEA from today  # proceed cycle #6 of FOLFIRI today; Labs today reviewed;  acceptable for treatment today- except for- hypokalemia; [see below]  # diarrhea- G-2 / colostomy-improved; lonotil-imodium prn. .    # Heart burn- s/p carafate- improved.    # pain management/chronic pain-STABLE; Continue  fentanyl 75 g every 72 hours and  oxycodone every 6-8 hours.New prescription given.  -# Hypokalemia- today 3.0.  patient intolerant to by mouth potassium. supp IV potassium today.  #Follow-up approximately 2 weeks;CT scans prior. One week labs possible IV fluids/potassium   No orders of the defined types were placed in this encounter.      Cammie Sickle, MD 01/11/2017 8:16 AM

## 2017-01-11 LAB — CEA: CEA: 43.6 ng/mL — AB (ref 0.0–4.7)

## 2017-01-12 ENCOUNTER — Inpatient Hospital Stay: Payer: BLUE CROSS/BLUE SHIELD | Attending: Oncology

## 2017-01-12 VITALS — BP 105/73 | HR 84 | Resp 20

## 2017-01-12 DIAGNOSIS — Z85828 Personal history of other malignant neoplasm of skin: Secondary | ICD-10-CM | POA: Diagnosis not present

## 2017-01-12 DIAGNOSIS — E876 Hypokalemia: Secondary | ICD-10-CM | POA: Insufficient documentation

## 2017-01-12 DIAGNOSIS — K219 Gastro-esophageal reflux disease without esophagitis: Secondary | ICD-10-CM | POA: Diagnosis not present

## 2017-01-12 DIAGNOSIS — D869 Sarcoidosis, unspecified: Secondary | ICD-10-CM | POA: Diagnosis not present

## 2017-01-12 DIAGNOSIS — E669 Obesity, unspecified: Secondary | ICD-10-CM | POA: Insufficient documentation

## 2017-01-12 DIAGNOSIS — I10 Essential (primary) hypertension: Secondary | ICD-10-CM | POA: Diagnosis not present

## 2017-01-12 DIAGNOSIS — C186 Malignant neoplasm of descending colon: Secondary | ICD-10-CM | POA: Insufficient documentation

## 2017-01-12 DIAGNOSIS — Z79899 Other long term (current) drug therapy: Secondary | ICD-10-CM | POA: Diagnosis not present

## 2017-01-12 DIAGNOSIS — Z933 Colostomy status: Secondary | ICD-10-CM | POA: Insufficient documentation

## 2017-01-12 DIAGNOSIS — Z87442 Personal history of urinary calculi: Secondary | ICD-10-CM | POA: Diagnosis not present

## 2017-01-12 DIAGNOSIS — G893 Neoplasm related pain (acute) (chronic): Secondary | ICD-10-CM | POA: Diagnosis not present

## 2017-01-12 DIAGNOSIS — C786 Secondary malignant neoplasm of retroperitoneum and peritoneum: Secondary | ICD-10-CM | POA: Diagnosis not present

## 2017-01-12 DIAGNOSIS — C787 Secondary malignant neoplasm of liver and intrahepatic bile duct: Secondary | ICD-10-CM | POA: Insufficient documentation

## 2017-01-12 DIAGNOSIS — C772 Secondary and unspecified malignant neoplasm of intra-abdominal lymph nodes: Secondary | ICD-10-CM | POA: Insufficient documentation

## 2017-01-12 DIAGNOSIS — R918 Other nonspecific abnormal finding of lung field: Secondary | ICD-10-CM | POA: Insufficient documentation

## 2017-01-12 MED ORDER — HEPARIN SOD (PORK) LOCK FLUSH 100 UNIT/ML IV SOLN
500.0000 [IU] | Freq: Once | INTRAVENOUS | Status: AC | PRN
Start: 2017-01-12 — End: 2017-01-12
  Administered 2017-01-12: 500 [IU]
  Filled 2017-01-12: qty 5

## 2017-01-12 MED ORDER — SODIUM CHLORIDE 0.9% FLUSH
10.0000 mL | INTRAVENOUS | Status: DC | PRN
  Administered 2017-01-12: 10 mL
  Filled 2017-01-12: qty 10

## 2017-01-13 ENCOUNTER — Other Ambulatory Visit: Payer: Self-pay | Admitting: *Deleted

## 2017-01-13 MED ORDER — LORAZEPAM 0.5 MG PO TABS: ORAL_TABLET | ORAL | 0 refills | Status: DC

## 2017-01-18 ENCOUNTER — Inpatient Hospital Stay: Payer: BLUE CROSS/BLUE SHIELD

## 2017-01-18 VITALS — BP 112/78 | HR 77 | Temp 98.6°F | Resp 20

## 2017-01-18 DIAGNOSIS — C186 Malignant neoplasm of descending colon: Secondary | ICD-10-CM | POA: Diagnosis not present

## 2017-01-18 DIAGNOSIS — C772 Secondary and unspecified malignant neoplasm of intra-abdominal lymph nodes: Secondary | ICD-10-CM

## 2017-01-18 DIAGNOSIS — R112 Nausea with vomiting, unspecified: Secondary | ICD-10-CM

## 2017-01-18 DIAGNOSIS — E860 Dehydration: Secondary | ICD-10-CM

## 2017-01-18 LAB — MAGNESIUM: Magnesium: 1.8 mg/dL (ref 1.7–2.4)

## 2017-01-18 LAB — POTASSIUM: POTASSIUM: 2.8 mmol/L — AB (ref 3.5–5.1)

## 2017-01-18 MED ORDER — HEPARIN SOD (PORK) LOCK FLUSH 100 UNIT/ML IV SOLN
500.0000 [IU] | Freq: Once | INTRAVENOUS | Status: AC
  Administered 2017-01-18: 500 [IU] via INTRAVENOUS

## 2017-01-18 MED ORDER — POTASSIUM CHLORIDE 2 MEQ/ML IV SOLN
Freq: Once | INTRAVENOUS | Status: AC
  Administered 2017-01-18: 10:00:00 via INTRAVENOUS
  Filled 2017-01-18: qty 20

## 2017-01-18 MED ORDER — HEPARIN SOD (PORK) LOCK FLUSH 100 UNIT/ML IV SOLN
INTRAVENOUS | Status: AC
  Filled 2017-01-18: qty 5

## 2017-01-21 ENCOUNTER — Ambulatory Visit: Payer: BLUE CROSS/BLUE SHIELD

## 2017-01-21 ENCOUNTER — Ambulatory Visit
Admission: RE | Admit: 2017-01-21 | Discharge: 2017-01-21 | Disposition: A | Discharge status: Home / Self Care | Payer: BLUE CROSS/BLUE SHIELD | Source: Ambulatory Visit | Attending: Internal Medicine | Admitting: Internal Medicine

## 2017-01-21 DIAGNOSIS — C186 Malignant neoplasm of descending colon: Secondary | ICD-10-CM | POA: Insufficient documentation

## 2017-01-21 DIAGNOSIS — C787 Secondary malignant neoplasm of liver and intrahepatic bile duct: Secondary | ICD-10-CM | POA: Insufficient documentation

## 2017-01-21 DIAGNOSIS — I2510 Atherosclerotic heart disease of native coronary artery without angina pectoris: Secondary | ICD-10-CM | POA: Diagnosis not present

## 2017-01-21 DIAGNOSIS — C7800 Secondary malignant neoplasm of unspecified lung: Secondary | ICD-10-CM | POA: Diagnosis not present

## 2017-01-21 DIAGNOSIS — R933 Abnormal findings on diagnostic imaging of other parts of digestive tract: Secondary | ICD-10-CM | POA: Insufficient documentation

## 2017-01-21 DIAGNOSIS — R918 Other nonspecific abnormal finding of lung field: Secondary | ICD-10-CM | POA: Insufficient documentation

## 2017-01-21 DIAGNOSIS — C771 Secondary and unspecified malignant neoplasm of intrathoracic lymph nodes: Secondary | ICD-10-CM | POA: Insufficient documentation

## 2017-01-21 DIAGNOSIS — C801 Malignant (primary) neoplasm, unspecified: Secondary | ICD-10-CM | POA: Insufficient documentation

## 2017-01-21 DIAGNOSIS — N200 Calculus of kidney: Secondary | ICD-10-CM | POA: Insufficient documentation

## 2017-01-21 MED ORDER — IOPAMIDOL (ISOVUE-300) INJECTION 61%
100.0000 mL | Freq: Once | INTRAVENOUS | Status: AC | PRN
  Administered 2017-01-21: 100 mL via INTRAVENOUS

## 2017-01-24 ENCOUNTER — Other Ambulatory Visit: Payer: BLUE CROSS/BLUE SHIELD

## 2017-01-24 ENCOUNTER — Ambulatory Visit: Payer: BLUE CROSS/BLUE SHIELD

## 2017-01-24 ENCOUNTER — Other Ambulatory Visit: Payer: Self-pay | Admitting: *Deleted

## 2017-01-24 DIAGNOSIS — C772 Secondary and unspecified malignant neoplasm of intra-abdominal lymph nodes: Principal | ICD-10-CM

## 2017-01-24 DIAGNOSIS — C186 Malignant neoplasm of descending colon: Secondary | ICD-10-CM

## 2017-01-26 ENCOUNTER — Inpatient Hospital Stay (HOSPITAL_BASED_OUTPATIENT_CLINIC_OR_DEPARTMENT_OTHER): Payer: BLUE CROSS/BLUE SHIELD | Admitting: Internal Medicine

## 2017-01-26 ENCOUNTER — Inpatient Hospital Stay: Payer: BLUE CROSS/BLUE SHIELD

## 2017-01-26 VITALS — BP 134/84 | HR 83 | Temp 97.8°F | Resp 18 | Ht 71.0 in | Wt 274.4 lb

## 2017-01-26 DIAGNOSIS — T451X5A Adverse effect of antineoplastic and immunosuppressive drugs, initial encounter: Secondary | ICD-10-CM

## 2017-01-26 DIAGNOSIS — C772 Secondary and unspecified malignant neoplasm of intra-abdominal lymph nodes: Secondary | ICD-10-CM

## 2017-01-26 DIAGNOSIS — G893 Neoplasm related pain (acute) (chronic): Secondary | ICD-10-CM

## 2017-01-26 DIAGNOSIS — E876 Hypokalemia: Secondary | ICD-10-CM

## 2017-01-26 DIAGNOSIS — R112 Nausea with vomiting, unspecified: Secondary | ICD-10-CM

## 2017-01-26 DIAGNOSIS — R918 Other nonspecific abnormal finding of lung field: Secondary | ICD-10-CM

## 2017-01-26 DIAGNOSIS — C787 Secondary malignant neoplasm of liver and intrahepatic bile duct: Secondary | ICD-10-CM

## 2017-01-26 DIAGNOSIS — C186 Malignant neoplasm of descending colon: Secondary | ICD-10-CM

## 2017-01-26 DIAGNOSIS — C786 Secondary malignant neoplasm of retroperitoneum and peritoneum: Secondary | ICD-10-CM | POA: Diagnosis not present

## 2017-01-26 DIAGNOSIS — Z79899 Other long term (current) drug therapy: Secondary | ICD-10-CM | POA: Diagnosis not present

## 2017-01-26 DIAGNOSIS — Z933 Colostomy status: Secondary | ICD-10-CM

## 2017-01-26 LAB — CBC WITH DIFFERENTIAL/PLATELET
BASOS PCT: 1 %
Basophils Absolute: 0 10*3/uL (ref 0–0.1)
EOS ABS: 0.1 10*3/uL (ref 0–0.7)
Eosinophils Relative: 3 %
HCT: 31.7 % — ABNORMAL LOW (ref 40.0–52.0)
HEMOGLOBIN: 10.5 g/dL — AB (ref 13.0–18.0)
LYMPHS ABS: 1.3 10*3/uL (ref 1.0–3.6)
Lymphocytes Relative: 39 %
MCH: 27.9 pg (ref 26.0–34.0)
MCHC: 33.1 g/dL (ref 32.0–36.0)
MCV: 84.3 fL (ref 80.0–100.0)
MONOS PCT: 14 %
Monocytes Absolute: 0.4 10*3/uL (ref 0.2–1.0)
Neutro Abs: 1.4 10*3/uL (ref 1.4–6.5)
Neutrophils Relative %: 43 %
PLATELETS: 261 10*3/uL (ref 150–440)
RBC: 3.76 MIL/uL — ABNORMAL LOW (ref 4.40–5.90)
RDW: 18.3 % — AB (ref 11.5–14.5)
WBC: 3.2 10*3/uL — ABNORMAL LOW (ref 3.8–10.6)

## 2017-01-26 LAB — COMPREHENSIVE METABOLIC PANEL
ALBUMIN: 3.4 g/dL — AB (ref 3.5–5.0)
ALK PHOS: 88 U/L (ref 38–126)
ALT: 14 U/L — ABNORMAL LOW (ref 17–63)
ANION GAP: 7 (ref 5–15)
AST: 24 U/L (ref 15–41)
BUN: 7 mg/dL (ref 6–20)
CALCIUM: 8.4 mg/dL — AB (ref 8.9–10.3)
CO2: 27 mmol/L (ref 22–32)
Chloride: 107 mmol/L (ref 101–111)
Creatinine, Ser: 0.95 mg/dL (ref 0.61–1.24)
GFR calc non Af Amer: >60 mL/min (ref >60)
Glucose, Bld: 121 mg/dL — ABNORMAL HIGH (ref 65–99)
POTASSIUM: 2.8 mmol/L — AB (ref 3.5–5.1)
SODIUM: 141 mmol/L (ref 135–145)
Total Bilirubin: 0.4 mg/dL (ref 0.3–1.2)
Total Protein: 6.6 g/dL (ref 6.5–8.1)

## 2017-01-26 LAB — MAGNESIUM: Magnesium: 1.5 mg/dL — ABNORMAL LOW (ref 1.7–2.4)

## 2017-01-26 MED ORDER — OXYCODONE HCL 10 MG PO TABS: 10.0000 mg | ORAL_TABLET | Freq: Four times a day (QID) | ORAL | 0 refills | Status: DC | PRN

## 2017-01-26 MED ORDER — POTASSIUM CHLORIDE CRYS ER 20 MEQ PO TBCR: EXTENDED_RELEASE_TABLET | ORAL | 3 refills | Status: DC

## 2017-01-26 MED ORDER — PROMETHAZINE HCL 25 MG PO TABS: 25.0000 mg | ORAL_TABLET | Freq: Four times a day (QID) | ORAL | 6 refills | Status: DC | PRN

## 2017-01-26 NOTE — Assessment & Plan Note (Addendum)
#    Colon cancer with metastases- peritoneal/omental metastases-status post small bowel resection; liver metastases on FOLFIRI s/p # 6 cycles- May 14th CT- improved omental; lung nodules; STABLE liver lesions. Given the poor tolerance to chemotherapy- recommend changing the chemotherapy to 5-FU-Avastin.   # START 5FU-Avastin every 2 weeks; starting next week.[ Discontinue irinotecan; bolus 5-FU secondary to diarrhea]. Reviewed the rationale for using Avastin. Discussed the potential side effects including but not limited to elevated blood pressure ; nephrotic syndrome wound healing problems.  # diarrhea- G-2 / colostomy-not resolved. Discussed patient of irinotecan; might help. If consistent problem than decreasing the dose of 5-FU infusion should be considered.   # pain management/chronic pain-STABLE; Continue  fentanyl 75 g every 72 hours and  oxycodone every 6-8 hours.New prescription given.  -# Hypokalemia- today 2.8; declines IV today; new prescription for Kdur 20 mg twice a day  # Hypomagnesemia magnesium 1.5; patient declines magnesium supplementation today.  #  Wants vacation in end of June 2018- should be okay; start 5-FU- Avastin next week.; Follow-up with X- M.D. in 3 weeks; labs chemotherapy.   # I reviewed the blood work- with the patient in detail; also reviewed the imaging independently [as summarized above]; and with the patient in detail. I also reviewed the patient's second opinion consultation at [facility]. I also discussed the use of y90 to the liver lesions sometime near future.

## 2017-01-26 NOTE — Progress Notes (Signed)
DISCONTINUE ON PATHWAY REGIMEN - Colorectal     A cycle is every 14 days:     Panitumumab        Dose Mod: None     Irinotecan        Dose Mod: None     Leucovorin        Dose Mod: None     5-Fluorouracil        Dose Mod: None     5-Fluorouracil        Dose Mod: None  **Always confirm dose/schedule in your pharmacy ordering system**    REASON: Toxicities / Adverse Event PRIOR TREATMENT: COS76: FOLFIRI + Panitumumab q14 Days TREATMENT RESPONSE: Partial Response (PR)  START ON PATHWAY REGIMEN - Colorectal     A cycle is every 14 days:     Irinotecan      Leucovorin      5-Fluorouracil      5-Fluorouracil      Bevacizumab   **Always confirm dose/schedule in your pharmacy ordering system**    Patient Characteristics: Metastatic Colorectal, First Line, Nonsurgical Candidate, KRAS/NRAS Wild-Type, BRAF Wild-Type/Unknown, PS = 0,1; Bevacizumab Eligible Current evidence of distant metastases? Yes AJCC T Category: [place] AJCC N Category: NX AJCC M Category: M1c AJCC 8 Stage Grouping: IVC BRAF Mutation Status: Wild Type (no mutation) KRAS/NRAS Mutation Status: Wild Type (no mutation) Line of therapy: First Line Would you be surprised if this patient died  in the next year? I would be surprised if this patient died in the next year Performance Status: PS = 0, 1 Bevacizumab Eligibility: Eligible  Intent of Therapy: Non-Curative / Palliative Intent, Discussed with Patient

## 2017-01-26 NOTE — Progress Notes (Signed)
Patient requesting RF on phernergan. Previously only rcvd #30 with no RFs of phenergan. Pt requesting #60 in quantity. States that phenergan and ativan is the only medications that control his nausea. He reports 3 loose bowel movements per day. Using Lomotil and imodium AD continuouslyas directed.

## 2017-01-26 NOTE — Progress Notes (Signed)
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 Specimen integrity: Intact. Macroscopic tumor perforation: Not identified. Invasive tumor: Maximum size: 5.5 cm. Histologic type(s): Invasive adenocarcinoma. Histologic grade and differentiation: G1: well differentiated/low grade  Pathologic Staging: pT4b, pN1b, pM1b . 2.Patient was started on FOLFOX in September of 2016,after 2 cycles of chemotherapy patient developed neuropathy. Patient had a previous neuropathy as a baseline which increased so was switched over to FOLFIRI I IN December of 2016 As patient has clear os wild-type we can proceed to add either a Avastin or cetuximab from the next chemotherapy.(November, 2016)  Avastin was added as abdominal wound has completely healed now Avastin on hold (December, 2016) because of hypertension Chemotherapy was discontinued after protocol 8 cycle because of significant side effect (last chemotherapy was on October 28, 2015).  # DEC 2017- small bowel obstruction s/p resection [Dr.Hoxworth]- positive for recurrence. 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 ? 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  male  with above history of recurrent metastatic colon cancer to the liver/omentum on palliative chemo- with FOLFIRI  Cycle 6  approximately 2 weeks ago; Is here today with the results of his restaging CAT scan.  Patient continues to have significant diarrhea up to 2-3 loose stools a day. Complains of fatigue. Intermittent nausea with vomiting. Patient's chronic pain is improved /stable on  oxycodone and fentanyl patches. Mild sores in the mouth. Improved.   The interim patient was evaluated at [facility] for a second opinion.   REVIEW OF SYSTEMS:  A complete 10 point review of system is done which is negative except mentioned above/history of present illness.   PAST MEDICAL HISTORY :  Past Medical History:  Diagnosis Date  . Abdominal pain    mid abdomen  . Adiposity 03/21/2015  . Arthritis   . 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 Colon cancer ([place])   . 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 Extreme obesity 12/21/2012  . GERD (gastroesophageal reflux disease)   . History of kidney stones   . History of transfusion   . Hypertension    has been off BP med x 6 months  . Iron deficiency anemia due to chronic blood loss 05/08/2015  . LBP (low back pain) 05/28/2014  .  Neuropathy   . PONV (postoperative nausea and vomiting)   . Sarcoid 10/2012  . Skin cancer    "it flairs up with sarcoidosis"    PAST SURGICAL HISTORY :   Past Surgical  History:  Procedure Laterality Date  . CARDIAC CATHETERIZATION    . FLEXIBLE SIGMOIDOSCOPY N/A 05/07/2015   Procedure: FLEXIBLE SIGMOIDOSCOPY;  Surgeon: Laurence Spates, MD;  Location: [facility];  Service: Endoscopy;  Laterality: N/A;  . LAPAROSCOPIC PARTIAL COLECTOMY N/A 05/07/2015   Procedure: LAPAROSCOPIC ASSITED PARTIAL COLECTOMY WITH COLOSTOMY, SMALL BOWEL RESECTION, EXCISION OF PERITONEAL NODULE;  Surgeon: [name], MD;  Location: [facility];  Service: General;  Laterality: N/A;  . LAPAROTOMY N/A 08/10/2016   Procedure: EXPLORATORY LAPAROTOMY small bowel resection abdominal wall resection partial omentectomy;  Surgeon: Excell Seltzer, MD;  Location: [facility];  Service: General;  Laterality: N/A;  . PORTACATH PLACEMENT Right 05/27/2015   Procedure: INSERTION PORT-A-CATH;  Surgeon: [name], MD;  Location: [facility];  Service: General;  Laterality: Right;    FAMILY HISTORY :   Family History  Problem Relation Age of Onset  . Arthritis Mother   . Heart disease Mother   . Stroke Father   . Prostate cancer Neg Hx   . Bladder Cancer Neg Hx     SOCIAL HISTORY:   Social History  Substance Use Topics  . Smoking status: Passive Smoke Exposure - Never Smoker  . Smokeless tobacco: Never Used  . Alcohol use No    ALLERGIES:  is allergic to versed [midazolam]; gabapentin; and paba derivatives.  MEDICATIONS:  Current Outpatient Prescriptions  Medication Sig Dispense Refill  . diphenoxylate-atropine (LOMOTIL) 2.5-0.025 MG tablet Take 1 tablet by mouth 4 (four) times daily as needed for diarrhea or loose stools. 30 tablet 3  . fentaNYL (DURAGESIC - DOSED MCG/HR) 75 MCG/HR Place 1 patch (75 mcg total) onto the skin every 3 (three) days. 10 patch 0  . lidocaine-prilocaine (EMLA) cream Apply 1 application topically as needed. Apply small amount to port site at least 1 hour prior to it being accessed, cover with plastic wrap 30 g 1  . loperamide (IMODIUM A-D) 2 MG tablet Take 2 mg by  mouth 4 (four) times daily as needed for diarrhea or loose stools.    Marland Kitchen LORazepam (ATIVAN) 0.5 MG tablet Take every 8 hours as needed for nausea/anxiety. 90 tablet 0  . Oxycodone HCl 10 MG TABS Take 1 tablet (10 mg total) by mouth every 6 (six) hours as needed (severe pain). 60 tablet 0  . promethazine (PHENERGAN) 25 MG tablet Take 1 tablet (25 mg total) by mouth every 6 (six) hours as needed. 60 tablet 6  . sucralfate (CARAFATE) 1 g tablet Take 1 tablet (1 g total) by mouth 4 (four) times daily. 120 tablet 1  . zolpidem (AMBIEN) 10 MG tablet Take 10-15 mg by mouth at bedtime as needed for sleep.    . potassium chloride SA (K-DUR,KLOR-CON) 20 MEQ tablet 1 pill twice a day 30 tablet 3   No current facility-administered medications for this visit.    Facility-Administered Medications Ordered in Other Visits  Medication Dose Route Frequency Provider Last Rate Last Dose  . sodium chloride flush (NS) 0.9 % injection 10 mL  10 mL Intravenous PRN Choksi, Delorise Shiner, MD   10 mL at 02/11/16 1326    PHYSICAL EXAMINATION: ECOG PERFORMANCE STATUS: 0 - Asymptomatic  BP 134/84 (BP Location: Left Arm, Patient Position: Sitting)   Pulse 83   Temp 97.8 F (36.6  C) (Tympanic)   Resp 18   Ht _0  (1.803 m)   Wt 274 lb 6.4 oz (124.5 kg)   BMI 38.27 kg/m   Filed Weights   01/26/17 1018  Weight: 274 lb 6.4 oz (124.5 kg)    GENERAL: Obese Caucasian male patient. Alert, no distress and comfortable.  Obese. Accompanied by is wife. Walking by himself.  EYES: no pallor or icterus OROPHARYNX: no thrush or ulceration; good dentition  NECK: supple, no masses felt LYMPH:  no palpable lymphadenopathy in the cervical, axillary or inguinal regions LUNGS: clear to auscultation and  No wheeze or crackles HEART/CVS: regular rate & rhythm and no murmurs; No lower extremity edema ABDOMEN:abdomen soft, non-tender and normal bowel sounds; Positive for colostomy. Incisions were healing. Musculoskeletal:no cyanosis of  digits and no clubbing  PSYCH: alert & oriented x 3 with fluent speech; anxious/ depressed/tearful NEURO: no focal motor/sensory deficits SKIN:  no rashes or significant lesions  LABORATORY DATA:  I have reviewed the data as listed    Component Value Date/Time   NA 141 01/26/2017 0950   NA 138 01/02/2015 1555   K 2.8 (L) 01/26/2017 0950   K 3.7 01/02/2015 1555   CL 107 01/26/2017 0950   CL 109 01/02/2015 1555   CO2 27 01/26/2017 0950   CO2 22 01/02/2015 1555   GLUCOSE 121 (H) 01/26/2017 0950   GLUCOSE 110 (H) 01/02/2015 1555   BUN 7 01/26/2017 0950   BUN 9 01/02/2015 1555   CREATININE 0.95 01/26/2017 0950   CREATININE 1.04 01/02/2015 1555   CALCIUM 8.4 (L) 01/26/2017 0950   CALCIUM 8.8 (L) 01/02/2015 1555   PROT 6.6 01/26/2017 0950   PROT 8.2 (H) 01/02/2015 1555   ALBUMIN 3.4 (L) 01/26/2017 0950   ALBUMIN 4.0 01/02/2015 1555   AST 24 01/26/2017 0950   AST 39 01/02/2015 1555   ALT 14 (L) 01/26/2017 0950   ALT 55 01/02/2015 1555   ALKPHOS 88 01/26/2017 0950   ALKPHOS 125 01/02/2015 1555   BILITOT 0.4 01/26/2017 0950   BILITOT 0.5 01/02/2015 1555   GFRNONAA >60 01/26/2017 0950   GFRNONAA >60 01/02/2015 1555   GFRAA >60 01/26/2017 0950   GFRAA >60 01/02/2015 1555    No results found for: SPEP, UPEP  Lab Results  Component Value Date   WBC 3.2 (L) 01/26/2017   NEUTROABS 1.4 01/26/2017   HGB 10.5 (L) 01/26/2017   HCT 31.7 (L) 01/26/2017   MCV 84.3 01/26/2017   PLT 261 01/26/2017      Chemistry      Component Value Date/Time   NA 141 01/26/2017 0950   NA 138 01/02/2015 1555   K 2.8 (L) 01/26/2017 0950   K 3.7 01/02/2015 1555   CL 107 01/26/2017 0950   CL 109 01/02/2015 1555   CO2 27 01/26/2017 0950   CO2 22 01/02/2015 1555   BUN 7 01/26/2017 0950   BUN 9 01/02/2015 1555   CREATININE 0.95 01/26/2017 0950   CREATININE 1.04 01/02/2015 1555      Component Value Date/Time   CALCIUM 8.4 (L) 01/26/2017 0950   CALCIUM 8.8 (L) 01/02/2015 1555   ALKPHOS 88  01/26/2017 0950   ALKPHOS 125 01/02/2015 1555   AST 24 01/26/2017 0950   AST 39 01/02/2015 1555   ALT 14 (L) 01/26/2017 0950   ALT 55 01/02/2015 1555   BILITOT 0.4 01/26/2017 0950   BILITOT 0.5 01/02/2015 1555     IMPRESSION: 1. Unfortunately there is new metastatic disease  to the right lung, extensive metastatic disease to the liver, and metastatic disease to the omentum, peritoneal margin, and mesentery. 2. Other imaging findings of potential clinical significance: Coronary atherosclerosis. 3. Bilateral nonobstructive nephrolithiasis.   Electronically Signed   By: Van Clines M.D.   On: 10/14/2016 13:33  RADIOGRAPHIC STUDIES: I have personally reviewed the radiological images as listed and agreed with the findings in the report. No results found.   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 Given the poor tolerance to chemotherapy- recommend changing the chemotherapy to 5-FU-Avastin.   # START 5FU-Avastin every 2 weeks; starting next week.[ Discontinue irinotecan; bolus 5-FU secondary to diarrhea]. Reviewed the rationale for using Avastin. Discussed the potential side effects including but not limited to elevated blood pressure ; nephrotic syndrome wound healing problems.  # diarrhea- G-2 / colostomy-not resolved. Discussed patient of irinotecan; might help. If consistent problem than decreasing the dose of 5-FU infusion should be considered.   # pain management/chronic pain-STABLE; Continue  fentanyl 75 g every 72 hours and  oxycodone every 6-8 hours.New prescription given.  -# Hypokalemia- today 2.8; declines IV today; new prescription for Kdur 20 mg twice a day  #  Wants vacation in end of June 2018- should be  okay; start 5-FU- Avastin next week.; Follow-up with X- M.D. in 3 weeks; labs chemotherapy.   # I reviewed the blood work- with the patient in detail; also reviewed the imaging independently [as summarized above]; and with the patient in detail. I also reviewed the patient's second opinion consultation at [facility]. I also discussed the use of y90 to the liver lesions sometime near future.   Orders Placed This Encounter  Procedures  . Basic metabolic panel    Standing Status:   Future    Standing Expiration Date:   01/26/2018  . Magnesium    Standing Status:   Future    Standing Expiration Date:   01/26/2018  . CBC with Differential    Standing Status:   Future    Standing Expiration Date:   01/26/2018  . Comprehensive metabolic panel    Standing Status:   Future    Standing Expiration Date:   01/26/2018  . Magnesium    Standing Status:   Future    Standing Expiration Date:   01/26/2018  . Urinalysis, Complete w Microscopic    Prior to Avastin treatments    Standing Status:   Future    Standing Expiration Date:   01/26/2018  . Urinalysis, Complete w Microscopic    Standing Status:   Future    Standing Expiration Date:   01/26/2018  . CBC with Differential    Standing Status:   Future    Standing Expiration Date:   01/26/2018  . Comprehensive metabolic panel    Standing Status:   Future    Standing Expiration Date:   01/26/2018  . Magnesium    Standing Status:   Future    Standing Expiration Date:   01/26/2018  . Urinalysis, Complete w Microscopic    Standing Status:   Future    Standing Expiration Date:   01/26/2018  . CBC with Differential    Standing Status:   Future    Standing Expiration Date:   01/26/2018  . Comprehensive metabolic  panel    Standing Status:   Future    Standing Expiration Date:   01/26/2018  . Magnesium    Standing Status:   Future    Standing Expiration Date:   01/26/2018  . Urinalysis, Complete w Microscopic    Standing Status:   Future    Standing  Expiration Date:   01/26/2018       Cammie Sickle, MD 01/26/2017 7:10 PM

## 2017-01-31 ENCOUNTER — Ambulatory Visit: Payer: BLUE CROSS/BLUE SHIELD | Admitting: Internal Medicine

## 2017-02-02 ENCOUNTER — Inpatient Hospital Stay: Payer: BLUE CROSS/BLUE SHIELD

## 2017-02-02 ENCOUNTER — Other Ambulatory Visit: Payer: Self-pay | Admitting: Internal Medicine

## 2017-02-02 ENCOUNTER — Inpatient Hospital Stay: Payer: BLUE CROSS/BLUE SHIELD | Admitting: *Deleted

## 2017-02-02 VITALS — BP 122/84 | HR 88 | Temp 98.5°F | Resp 20

## 2017-02-02 DIAGNOSIS — R112 Nausea with vomiting, unspecified: Secondary | ICD-10-CM

## 2017-02-02 DIAGNOSIS — C186 Malignant neoplasm of descending colon: Secondary | ICD-10-CM

## 2017-02-02 DIAGNOSIS — E860 Dehydration: Secondary | ICD-10-CM

## 2017-02-02 DIAGNOSIS — C772 Secondary and unspecified malignant neoplasm of intra-abdominal lymph nodes: Principal | ICD-10-CM

## 2017-02-02 DIAGNOSIS — E876 Hypokalemia: Secondary | ICD-10-CM

## 2017-02-02 LAB — BASIC METABOLIC PANEL
ANION GAP: 7 (ref 5–15)
BUN: 8 mg/dL (ref 6–20)
CALCIUM: 8.1 mg/dL — AB (ref 8.9–10.3)
CO2: 26 mmol/L (ref 22–32)
CREATININE: 1 mg/dL (ref 0.61–1.24)
Chloride: 108 mmol/L (ref 101–111)
Glucose, Bld: 102 mg/dL — ABNORMAL HIGH (ref 65–99)
Potassium: 2.9 mmol/L — ABNORMAL LOW (ref 3.5–5.1)
SODIUM: 141 mmol/L (ref 135–145)

## 2017-02-02 LAB — CBC WITH DIFFERENTIAL/PLATELET
BASOS ABS: 0.1 10*3/uL (ref 0–0.1)
Basophils Relative: 1 %
EOS PCT: 2 %
Eosinophils Absolute: 0.1 10*3/uL (ref 0–0.7)
HCT: 32.3 % — ABNORMAL LOW (ref 40.0–52.0)
HEMOGLOBIN: 10.7 g/dL — AB (ref 13.0–18.0)
LYMPHS ABS: 1.2 10*3/uL (ref 1.0–3.6)
LYMPHS PCT: 23 %
MCH: 28 pg (ref 26.0–34.0)
MCHC: 33.2 g/dL (ref 32.0–36.0)
MCV: 84.5 fL (ref 80.0–100.0)
MONO ABS: 0.7 10*3/uL (ref 0.2–1.0)
Monocytes Relative: 13 %
NEUTROS ABS: 3.2 10*3/uL (ref 1.4–6.5)
Neutrophils Relative %: 61 %
PLATELETS: 242 10*3/uL (ref 150–440)
RBC: 3.83 MIL/uL — AB (ref 4.40–5.90)
RDW: 19.2 % — ABNORMAL HIGH (ref 11.5–14.5)
WBC: 5.3 10*3/uL (ref 3.8–10.6)

## 2017-02-02 LAB — URINALYSIS, COMPLETE (UACMP) WITH MICROSCOPIC
Bacteria, UA: NONE SEEN
Bilirubin Urine: NEGATIVE
GLUCOSE, UA: NEGATIVE mg/dL
HGB URINE DIPSTICK: NEGATIVE
Ketones, ur: NEGATIVE mg/dL
Leukocytes, UA: NEGATIVE
NITRITE: NEGATIVE
PH: 6 (ref 5.0–8.0)
PROTEIN: NEGATIVE mg/dL
SPECIFIC GRAVITY, URINE: 1.014 (ref 1.005–1.030)

## 2017-02-02 LAB — MAGNESIUM: MAGNESIUM: 1.4 mg/dL — AB (ref 1.7–2.4)

## 2017-02-02 MED ORDER — SODIUM CHLORIDE 0.9 % IV SOLN
600.0000 mg | Freq: Once | INTRAVENOUS | Status: AC
  Administered 2017-02-02: 600 mg via INTRAVENOUS
  Filled 2017-02-02: qty 16

## 2017-02-02 MED ORDER — PALONOSETRON HCL INJECTION 0.25 MG/5ML
0.2500 mg | Freq: Once | INTRAVENOUS | Status: AC
  Administered 2017-02-02: 0.25 mg via INTRAVENOUS
  Filled 2017-02-02: qty 5

## 2017-02-02 MED ORDER — SODIUM CHLORIDE 0.9 % IV SOLN: 10.0000 mg | Freq: Once | INTRAVENOUS | Status: DC

## 2017-02-02 MED ORDER — LEUCOVORIN CALCIUM INJECTION 350 MG: 400.0000 mg/m2 | Freq: Once | INTRAVENOUS | Status: DC

## 2017-02-02 MED ORDER — SODIUM CHLORIDE 0.9 % IV SOLN
2400.0000 mg/m2 | INTRAVENOUS | Status: DC
  Administered 2017-02-02: 6000 mg via INTRAVENOUS
  Filled 2017-02-02: qty 100

## 2017-02-02 MED ORDER — LEUCOVORIN CALCIUM INJECTION 100 MG
20.0000 mg/m2 | Freq: Once | INTRAMUSCULAR | Status: AC
  Administered 2017-02-02: 50 mg via INTRAVENOUS
  Filled 2017-02-02: qty 2.5

## 2017-02-02 MED ORDER — SODIUM CHLORIDE 0.9 % IV SOLN
Freq: Once | INTRAVENOUS | Status: AC
  Administered 2017-02-02: 10:00:00 via INTRAVENOUS
  Filled 2017-02-02: qty 20

## 2017-02-02 MED ORDER — SODIUM CHLORIDE 0.9% FLUSH
10.0000 mL | INTRAVENOUS | Status: DC | PRN
  Filled 2017-02-02: qty 10

## 2017-02-02 MED ORDER — ATROPINE SULFATE 1 MG/ML IJ SOLN
0.5000 mg | Freq: Once | INTRAMUSCULAR | Status: AC | PRN
  Administered 2017-02-02: 0.5 mg via INTRAVENOUS
  Filled 2017-02-02: qty 1

## 2017-02-02 MED ORDER — SODIUM CHLORIDE 0.9 % IV SOLN
Freq: Once | INTRAVENOUS | Status: AC
  Administered 2017-02-02: 10:00:00 via INTRAVENOUS
  Filled 2017-02-02: qty 1000

## 2017-02-02 MED ORDER — DEXAMETHASONE SODIUM PHOSPHATE 10 MG/ML IJ SOLN
10.0000 mg | Freq: Once | INTRAMUSCULAR | Status: AC
  Administered 2017-02-02: 10 mg via INTRAVENOUS
  Filled 2017-02-02: qty 1

## 2017-02-02 NOTE — Progress Notes (Signed)
Potassium: 2.9, Magnesium: 1.4. MD, [name], notified via telephone and discussed lab work with the patient. MD to place orders in supportive therapy plan. See MAR.

## 2017-02-03 ENCOUNTER — Other Ambulatory Visit: Payer: Self-pay | Admitting: *Deleted

## 2017-02-03 MED ORDER — PROCHLORPERAZINE MALEATE 10 MG PO TABS: 10.0000 mg | ORAL_TABLET | Freq: Four times a day (QID) | ORAL | 2 refills | Status: DC | PRN

## 2017-02-03 NOTE — Telephone Encounter (Deleted)
Requesting refill on Compazine, this is no longer onhis med list and was d/c'd in Feb. Roxanne states he takes compazine, Phenergan, and Ativan to control his nausea. Please advise

## 2017-02-03 NOTE — Telephone Encounter (Signed)
Not due

## 2017-02-04 ENCOUNTER — Inpatient Hospital Stay: Payer: BLUE CROSS/BLUE SHIELD

## 2017-02-04 VITALS — BP 110/80 | HR 71 | Temp 97.6°F | Resp 20

## 2017-02-04 DIAGNOSIS — C772 Secondary and unspecified malignant neoplasm of intra-abdominal lymph nodes: Principal | ICD-10-CM

## 2017-02-04 DIAGNOSIS — C186 Malignant neoplasm of descending colon: Secondary | ICD-10-CM | POA: Diagnosis not present

## 2017-02-04 MED ORDER — HEPARIN SOD (PORK) LOCK FLUSH 100 UNIT/ML IV SOLN
500.0000 [IU] | Freq: Once | INTRAVENOUS | Status: AC | PRN
  Administered 2017-02-04: 500 [IU]

## 2017-02-04 MED ORDER — SODIUM CHLORIDE 0.9% FLUSH
10.0000 mL | INTRAVENOUS | Status: DC | PRN
  Administered 2017-02-04: 10 mL
  Filled 2017-02-04: qty 10

## 2017-02-04 MED ORDER — HEPARIN SOD (PORK) LOCK FLUSH 100 UNIT/ML IV SOLN
INTRAVENOUS | Status: AC
  Filled 2017-02-04: qty 5

## 2017-02-10 ENCOUNTER — Other Ambulatory Visit: Payer: Self-pay | Admitting: *Deleted

## 2017-02-10 NOTE — Telephone Encounter (Signed)
Per [name], he is not taking this medication

## 2017-02-15 NOTE — Progress Notes (Signed)
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 He continues to have diarrhea, but otherwise feels well. His pain is well-controlled on his current narcotic regimen. He has no neurologic complaints. He denies any recent fevers or illnesses. He has no chest pain or shortness of breath. He has a fair appetite, but denies weight loss. He denies any nausea, vomiting, or constipation. He has no urinary complaints. Patient otherwise feels well and offers no further specific complaints.   REVIEW OF SYSTEMS:   Review of Systems  Constitutional: Negative.  Negative for fever, malaise/fatigue and weight loss.  Respiratory: Negative.  Negative for cough and shortness of breath.   Cardiovascular: Negative.  Negative for chest pain and leg swelling.  Gastrointestinal: Positive for diarrhea. Negative for abdominal pain, nausea and vomiting.  Genitourinary: Negative.   Musculoskeletal: Negative.   Skin: Negative.  Negative for rash.  Neurological: Negative.  Negative for sensory change and weakness.  Psychiatric/Behavioral: Negative.  The patient is not nervous/anxious.     As per HPI. Otherwise, a complete review of systems is negative.  PAST MEDICAL HISTORY: Past  Medical History:  Diagnosis Date  . Abdominal pain    mid abdomen  . Adiposity 03/21/2015  . Arthritis   . 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 Colon cancer ([place])   . 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 Extreme obesity 12/21/2012  . GERD (gastroesophageal reflux disease)   . History of kidney stones   . History of transfusion   . Hypertension    has been off BP med x 6 months  . Iron deficiency anemia due to chronic blood loss 05/08/2015  . LBP (low back pain) 05/28/2014  . Neuropathy   . PONV (postoperative nausea and vomiting)   . Sarcoid 10/2012  . Skin cancer    "it flairs up with sarcoidosis"    PAST SURGICAL HISTORY: Past Surgical History:  Procedure Laterality Date  . CARDIAC CATHETERIZATION    . FLEXIBLE SIGMOIDOSCOPY N/A 05/07/2015   Procedure: FLEXIBLE SIGMOIDOSCOPY;  Surgeon: Laurence Spates, MD;  Location: [facility];  Service: Endoscopy;  Laterality: N/A;  . LAPAROSCOPIC PARTIAL COLECTOMY N/A 05/07/2015   Procedure: LAPAROSCOPIC ASSITED PARTIAL COLECTOMY WITH COLOSTOMY, SMALL BOWEL RESECTION, EXCISION OF PERITONEAL NODULE;  Surgeon: [name], MD;  Location: [facility];  Service: General;  Laterality: N/A;  . LAPAROTOMY N/A 08/10/2016   Procedure: EXPLORATORY LAPAROTOMY small bowel resection abdominal wall resection partial omentectomy;  Surgeon: Excell Seltzer, MD;  Location: [facility];  Service: General;  Laterality: N/A;  . PORTACATH PLACEMENT Right 05/27/2015   Procedure: INSERTION PORT-A-CATH;  Surgeon: [name], MD;  Location: [facility];  Service: General;  Laterality: Right;    FAMILY HISTORY: Family History  Problem Relation Age of Onset  . Arthritis Mother   . Heart disease Mother   . Stroke Father   . Prostate cancer Neg Hx   . Bladder Cancer  Neg Hx     ADVANCED DIRECTIVES (Y/N):  N  HEALTH MAINTENANCE: Social History  Substance Use Topics  . Smoking status: Passive Smoke Exposure - Never Smoker  . Smokeless tobacco: Never Used    . Alcohol use No     Colonoscopy:  PAP:  Bone density:  Lipid panel:  Allergies  Allergen Reactions  . Versed [Midazolam] Nausea And Vomiting  . Gabapentin Other (See Comments)    Swallowing problems  . Paba Derivatives Nausea And Vomiting    Pt states he is allergic to unknown anesthesia. Pt has nausea and vomiting with anesthesia.     Current Outpatient Prescriptions  Medication Sig Dispense Refill  . diphenoxylate-atropine (LOMOTIL) 2.5-0.025 MG tablet Take 1 tablet by mouth 4 (four) times daily as needed for diarrhea or loose stools. 30 tablet 3  . fentaNYL (DURAGESIC - DOSED MCG/HR) 75 MCG/HR Place 1 patch (75 mcg total) onto the skin every 3 (three) days. 10 patch 0  . lidocaine-prilocaine (EMLA) cream Apply 1 application topically as needed. Apply small amount to port site at least 1 hour prior to it being accessed, cover with plastic wrap 30 g 1  . loperamide (IMODIUM A-D) 2 MG tablet Take 2 mg by mouth 4 (four) times daily as needed for diarrhea or loose stools.    Marland Kitchen LORazepam (ATIVAN) 0.5 MG tablet Take every 8 hours as needed for nausea/anxiety. 90 tablet 0  . Oxycodone HCl 10 MG TABS Take 1 tablet (10 mg total) by mouth every 6 (six) hours as needed (severe pain). 60 tablet 0  . potassium chloride SA (K-DUR,KLOR-CON) 20 MEQ tablet 1 pill twice a day 30 tablet 3  . prochlorperazine (COMPAZINE) 10 MG tablet Take 1 tablet (10 mg total) by mouth every 6 (six) hours as needed for nausea or vomiting. 60 tablet 2  . promethazine (PHENERGAN) 25 MG tablet Take 1 tablet (25 mg total) by mouth every 6 (six) hours as needed. 60 tablet 6  . sucralfate (CARAFATE) 1 g tablet Take 1 tablet (1 g total) by mouth 4 (four) times daily. 120 tablet 1  . zolpidem (AMBIEN) 10 MG tablet Take 10-15 mg by mouth at bedtime as needed for sleep.     No current facility-administered medications for this visit.    Facility-Administered Medications Ordered in Other Visits  Medication Dose Route  Frequency Provider Last Rate Last Dose  . sodium chloride flush (NS) 0.9 % injection 10 mL  10 mL Intravenous PRN Forest Gleason, MD   10 mL at 02/11/16 1326    OBJECTIVE: There were no vitals filed for this visit.   There is no height or weight on file to calculate BMI.    ECOG FS:0 - Asymptomatic  General: Well-developed, well-nourished, no acute distress. Eyes: Pink conjunctiva, anicteric sclera. Lungs: Clear to auscultation bilaterally. Heart: Regular rate and rhythm. No rubs, murmurs, or gallops. Abdomen: Soft, nontender, nondistended. No organomegaly noted, normoactive bowel sounds. Musculoskeletal: No edema, cyanosis, or clubbing. Neuro: Alert, answering all questions appropriately. Cranial nerves grossly intact. Skin: No rashes or petechiae noted. Psych: Normal affect.   LAB RESULTS:  Lab Results  Component Value Date   NA 139 02/16/2017   K 3.3 (L) 02/16/2017   CL 109 02/16/2017   CO2 24 02/16/2017   GLUCOSE 128 (H) 02/16/2017   BUN 7 02/16/2017   CREATININE 0.95 02/16/2017   CALCIUM 8.5 (L) 02/16/2017   PROT 6.7 02/16/2017   ALBUMIN 3.4 (L) 02/16/2017   AST 29 02/16/2017  ALT 20 02/16/2017   ALKPHOS 100 02/16/2017   BILITOT 0.6 02/16/2017   GFRNONAA >60 02/16/2017   GFRAA >60 02/16/2017    Lab Results  Component Value Date   WBC 7.6 02/16/2017   NEUTROABS 5.0 02/16/2017   HGB 11.3 (L) 02/16/2017   HCT 34.2 (L) 02/16/2017   MCV 86.2 02/16/2017   PLT 197 02/16/2017     STUDIES: No results found.  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 Given the poor tolerance to chemotherapy- continue with to 5-FU-Avastin.    # cycle 2 of 5FU-Avastin every 2 weeks today.[ Discontinue irinotecan; bolus 5-FU  secondary to diarrhea]. Reviewed the rationale for using Avastin. Discussed the potential side effects including but not limited to elevated blood pressure ; nephrotic syndrome wound healing problems.  # diarrhea- G-2 / colostomy-improved. Discontinue irinotecan as above. If consistent problem than decreasing the dose of 5-FU infusion should be considered.   # pain management/chronic pain-STABLE; Continue  fentanyl 75 g every 72 hours and  oxycodone every 6-8 hours.New prescription given.  -# Hypokalemia- improved, 3.3 today. Continue Kdur 20 mg twice a day  #   return to clinic in 4 weeks for continuation of treatment secondary to reschedule vacation at the end of June 2018. I also reviewed the patient's second opinion consultation at [facility]. I also discussed the use of y90 to the liver lesions sometime near future.   Patient expressed understanding and was in agreement with this plan. He also understands that He can call clinic at any time with any questions, concerns, or complaints.   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

## 2017-02-16 ENCOUNTER — Inpatient Hospital Stay (HOSPITAL_BASED_OUTPATIENT_CLINIC_OR_DEPARTMENT_OTHER): Payer: BLUE CROSS/BLUE SHIELD | Admitting: Oncology

## 2017-02-16 ENCOUNTER — Inpatient Hospital Stay: Payer: BLUE CROSS/BLUE SHIELD

## 2017-02-16 ENCOUNTER — Other Ambulatory Visit: Payer: Self-pay | Admitting: Oncology

## 2017-02-16 ENCOUNTER — Inpatient Hospital Stay: Payer: BLUE CROSS/BLUE SHIELD | Attending: Oncology

## 2017-02-16 VITALS — BP 120/89 | HR 70 | Resp 20

## 2017-02-16 DIAGNOSIS — C186 Malignant neoplasm of descending colon: Secondary | ICD-10-CM | POA: Insufficient documentation

## 2017-02-16 DIAGNOSIS — C772 Secondary and unspecified malignant neoplasm of intra-abdominal lymph nodes: Secondary | ICD-10-CM | POA: Diagnosis not present

## 2017-02-16 DIAGNOSIS — Z85828 Personal history of other malignant neoplasm of skin: Secondary | ICD-10-CM | POA: Diagnosis not present

## 2017-02-16 DIAGNOSIS — G893 Neoplasm related pain (acute) (chronic): Secondary | ICD-10-CM | POA: Insufficient documentation

## 2017-02-16 DIAGNOSIS — E669 Obesity, unspecified: Secondary | ICD-10-CM | POA: Diagnosis not present

## 2017-02-16 DIAGNOSIS — C786 Secondary malignant neoplasm of retroperitoneum and peritoneum: Secondary | ICD-10-CM | POA: Insufficient documentation

## 2017-02-16 DIAGNOSIS — Z79899 Other long term (current) drug therapy: Secondary | ICD-10-CM

## 2017-02-16 DIAGNOSIS — Z933 Colostomy status: Secondary | ICD-10-CM

## 2017-02-16 DIAGNOSIS — E869 Volume depletion, unspecified: Secondary | ICD-10-CM | POA: Diagnosis not present

## 2017-02-16 DIAGNOSIS — C787 Secondary malignant neoplasm of liver and intrahepatic bile duct: Secondary | ICD-10-CM | POA: Diagnosis not present

## 2017-02-16 DIAGNOSIS — E876 Hypokalemia: Secondary | ICD-10-CM | POA: Insufficient documentation

## 2017-02-16 DIAGNOSIS — R197 Diarrhea, unspecified: Secondary | ICD-10-CM | POA: Diagnosis not present

## 2017-02-16 DIAGNOSIS — Z87442 Personal history of urinary calculi: Secondary | ICD-10-CM | POA: Insufficient documentation

## 2017-02-16 DIAGNOSIS — I10 Essential (primary) hypertension: Secondary | ICD-10-CM | POA: Insufficient documentation

## 2017-02-16 DIAGNOSIS — Z5111 Encounter for antineoplastic chemotherapy: Secondary | ICD-10-CM | POA: Diagnosis not present

## 2017-02-16 DIAGNOSIS — K219 Gastro-esophageal reflux disease without esophagitis: Secondary | ICD-10-CM | POA: Insufficient documentation

## 2017-02-16 LAB — URINALYSIS, COMPLETE (UACMP) WITH MICROSCOPIC
BACTERIA UA: NONE SEEN
BILIRUBIN URINE: NEGATIVE
GLUCOSE, UA: NEGATIVE mg/dL
HGB URINE DIPSTICK: NEGATIVE
KETONES UR: NEGATIVE mg/dL
Leukocytes, UA: NEGATIVE
NITRITE: NEGATIVE
PROTEIN: NEGATIVE mg/dL
RBC / HPF: NONE SEEN RBC/hpf (ref 0–5)
Specific Gravity, Urine: 1.016 (ref 1.005–1.030)
pH: 5 (ref 5.0–8.0)

## 2017-02-16 LAB — CBC WITH DIFFERENTIAL/PLATELET
BASOS ABS: 0 10*3/uL (ref 0–0.1)
BASOS PCT: 1 %
EOS ABS: 0.1 10*3/uL (ref 0–0.7)
Eosinophils Relative: 2 %
HCT: 34.2 % — ABNORMAL LOW (ref 40.0–52.0)
Hemoglobin: 11.3 g/dL — ABNORMAL LOW (ref 13.0–18.0)
Lymphocytes Relative: 21 %
Lymphs Abs: 1.6 10*3/uL (ref 1.0–3.6)
MCH: 28.6 pg (ref 26.0–34.0)
MCHC: 33.2 g/dL (ref 32.0–36.0)
MCV: 86.2 fL (ref 80.0–100.0)
Monocytes Absolute: 0.8 10*3/uL (ref 0.2–1.0)
Monocytes Relative: 11 %
NEUTROS ABS: 5 10*3/uL (ref 1.4–6.5)
NEUTROS PCT: 65 %
Platelets: 197 10*3/uL (ref 150–440)
RBC: 3.96 MIL/uL — ABNORMAL LOW (ref 4.40–5.90)
RDW: 19.1 % — AB (ref 11.5–14.5)
WBC: 7.6 10*3/uL (ref 3.8–10.6)

## 2017-02-16 LAB — COMPREHENSIVE METABOLIC PANEL
ALBUMIN: 3.4 g/dL — AB (ref 3.5–5.0)
ALT: 20 U/L (ref 17–63)
AST: 29 U/L (ref 15–41)
Alkaline Phosphatase: 100 U/L (ref 38–126)
Anion gap: 6 (ref 5–15)
BILIRUBIN TOTAL: 0.6 mg/dL (ref 0.3–1.2)
BUN: 7 mg/dL (ref 6–20)
CHLORIDE: 109 mmol/L (ref 101–111)
CO2: 24 mmol/L (ref 22–32)
Calcium: 8.5 mg/dL — ABNORMAL LOW (ref 8.9–10.3)
Creatinine, Ser: 0.95 mg/dL (ref 0.61–1.24)
GFR calc Af Amer: >60 mL/min (ref >60)
GFR calc non Af Amer: >60 mL/min (ref >60)
GLUCOSE: 128 mg/dL — AB (ref 65–99)
POTASSIUM: 3.3 mmol/L — AB (ref 3.5–5.1)
SODIUM: 139 mmol/L (ref 135–145)
TOTAL PROTEIN: 6.7 g/dL (ref 6.5–8.1)

## 2017-02-16 LAB — MAGNESIUM: Magnesium: 1.6 mg/dL — ABNORMAL LOW (ref 1.7–2.4)

## 2017-02-16 MED ORDER — FENTANYL 75 MCG/HR TD PT72: 75.0000 ug | MEDICATED_PATCH | TRANSDERMAL | 0 refills | Status: DC

## 2017-02-16 MED ORDER — SODIUM CHLORIDE 0.9% FLUSH
10.0000 mL | INTRAVENOUS | Status: DC | PRN
  Administered 2017-02-16: 10 mL via INTRAVENOUS
  Filled 2017-02-16: qty 10

## 2017-02-16 MED ORDER — LORAZEPAM 0.5 MG PO TABS: ORAL_TABLET | ORAL | 0 refills | Status: DC

## 2017-02-16 MED ORDER — SODIUM CHLORIDE 0.9 % IV SOLN
2400.0000 mg/m2 | INTRAVENOUS | Status: DC
  Administered 2017-02-16: 6000 mg via INTRAVENOUS
  Filled 2017-02-16: qty 120

## 2017-02-16 MED ORDER — HEPARIN SOD (PORK) LOCK FLUSH 100 UNIT/ML IV SOLN: 500.0000 [IU] | Freq: Once | INTRAVENOUS | Status: DC

## 2017-02-16 MED ORDER — DEXAMETHASONE SODIUM PHOSPHATE 10 MG/ML IJ SOLN
10.0000 mg | Freq: Once | INTRAMUSCULAR | Status: AC
  Administered 2017-02-16: 10 mg via INTRAVENOUS
  Filled 2017-02-16: qty 1

## 2017-02-16 MED ORDER — BEVACIZUMAB CHEMO INJECTION 400 MG/16ML
600.0000 mg | Freq: Once | INTRAVENOUS | Status: AC
  Administered 2017-02-16: 600 mg via INTRAVENOUS
  Filled 2017-02-16: qty 16

## 2017-02-16 MED ORDER — SODIUM CHLORIDE 0.9 % IV SOLN
Freq: Once | INTRAVENOUS | Status: AC
  Administered 2017-02-16: 11:00:00 via INTRAVENOUS
  Filled 2017-02-16: qty 1000

## 2017-02-16 MED ORDER — ATROPINE SULFATE 1 MG/ML IJ SOLN
0.5000 mg | Freq: Once | INTRAMUSCULAR | Status: AC | PRN
  Administered 2017-02-16: 0.5 mg via INTRAVENOUS
  Filled 2017-02-16: qty 1

## 2017-02-16 MED ORDER — PALONOSETRON HCL INJECTION 0.25 MG/5ML
0.2500 mg | Freq: Once | INTRAVENOUS | Status: AC
  Administered 2017-02-16: 0.25 mg via INTRAVENOUS
  Filled 2017-02-16: qty 5

## 2017-02-16 MED ORDER — LEUCOVORIN CALCIUM INJECTION 100 MG
20.0000 mg/m2 | Freq: Once | INTRAMUSCULAR | Status: AC
  Administered 2017-02-16: 50 mg via INTRAVENOUS
  Filled 2017-02-16: qty 2.5

## 2017-02-16 MED ORDER — SODIUM CHLORIDE 0.9 % IV SOLN: 10.0000 mg | Freq: Once | INTRAVENOUS | Status: DC

## 2017-02-16 MED ORDER — DEXTROSE 5 % IV SOLN: 400.0000 mg/m2 | Freq: Once | INTRAVENOUS | Status: DC

## 2017-02-16 MED ORDER — OXYCODONE HCL 10 MG PO TABS: 10.0000 mg | ORAL_TABLET | Freq: Four times a day (QID) | ORAL | 0 refills | Status: DC | PRN

## 2017-02-16 NOTE — Progress Notes (Signed)
Patient here today for follow up.  Patient states no new concerns today  

## 2017-02-18 ENCOUNTER — Inpatient Hospital Stay: Payer: BLUE CROSS/BLUE SHIELD

## 2017-02-18 VITALS — BP 126/81 | HR 75 | Resp 20

## 2017-02-18 DIAGNOSIS — C186 Malignant neoplasm of descending colon: Secondary | ICD-10-CM

## 2017-02-18 DIAGNOSIS — C801 Malignant (primary) neoplasm, unspecified: Secondary | ICD-10-CM

## 2017-02-18 DIAGNOSIS — C772 Secondary and unspecified malignant neoplasm of intra-abdominal lymph nodes: Secondary | ICD-10-CM

## 2017-02-18 MED ORDER — HEPARIN SOD (PORK) LOCK FLUSH 100 UNIT/ML IV SOLN
500.0000 [IU] | Freq: Once | INTRAVENOUS | Status: AC
  Administered 2017-02-18: 500 [IU] via INTRAVENOUS

## 2017-02-18 MED ORDER — SODIUM CHLORIDE 0.9% FLUSH
10.0000 mL | INTRAVENOUS | Status: DC | PRN
  Administered 2017-02-18: 10 mL via INTRAVENOUS
  Filled 2017-02-18: qty 10

## 2017-03-02 ENCOUNTER — Other Ambulatory Visit: Payer: BLUE CROSS/BLUE SHIELD

## 2017-03-02 ENCOUNTER — Ambulatory Visit: Payer: BLUE CROSS/BLUE SHIELD | Admitting: Oncology

## 2017-03-02 ENCOUNTER — Ambulatory Visit: Payer: BLUE CROSS/BLUE SHIELD

## 2017-03-15 ENCOUNTER — Other Ambulatory Visit: Payer: Self-pay | Admitting: *Deleted

## 2017-03-15 DIAGNOSIS — C186 Malignant neoplasm of descending colon: Secondary | ICD-10-CM

## 2017-03-15 DIAGNOSIS — C772 Secondary and unspecified malignant neoplasm of intra-abdominal lymph nodes: Principal | ICD-10-CM

## 2017-03-15 DIAGNOSIS — G893 Neoplasm related pain (acute) (chronic): Secondary | ICD-10-CM

## 2017-03-15 MED ORDER — FENTANYL 75 MCG/HR TD PT72: 75.0000 ug | MEDICATED_PATCH | TRANSDERMAL | 0 refills | Status: DC

## 2017-03-21 ENCOUNTER — Inpatient Hospital Stay: Payer: BLUE CROSS/BLUE SHIELD

## 2017-03-21 ENCOUNTER — Inpatient Hospital Stay: Payer: BLUE CROSS/BLUE SHIELD | Attending: Internal Medicine | Admitting: Internal Medicine

## 2017-03-21 VITALS — BP 128/90 | HR 94 | Temp 97.8°F | Resp 20 | Ht 71.0 in | Wt 273.0 lb

## 2017-03-21 DIAGNOSIS — G893 Neoplasm related pain (acute) (chronic): Secondary | ICD-10-CM

## 2017-03-21 DIAGNOSIS — Z933 Colostomy status: Secondary | ICD-10-CM | POA: Diagnosis not present

## 2017-03-21 DIAGNOSIS — C772 Secondary and unspecified malignant neoplasm of intra-abdominal lymph nodes: Secondary | ICD-10-CM | POA: Diagnosis not present

## 2017-03-21 DIAGNOSIS — E876 Hypokalemia: Secondary | ICD-10-CM | POA: Diagnosis not present

## 2017-03-21 DIAGNOSIS — C786 Secondary malignant neoplasm of retroperitoneum and peritoneum: Secondary | ICD-10-CM | POA: Diagnosis not present

## 2017-03-21 DIAGNOSIS — C787 Secondary malignant neoplasm of liver and intrahepatic bile duct: Secondary | ICD-10-CM | POA: Diagnosis not present

## 2017-03-21 DIAGNOSIS — K219 Gastro-esophageal reflux disease without esophagitis: Secondary | ICD-10-CM | POA: Diagnosis not present

## 2017-03-21 DIAGNOSIS — E669 Obesity, unspecified: Secondary | ICD-10-CM | POA: Insufficient documentation

## 2017-03-21 DIAGNOSIS — Z5111 Encounter for antineoplastic chemotherapy: Secondary | ICD-10-CM | POA: Insufficient documentation

## 2017-03-21 DIAGNOSIS — C186 Malignant neoplasm of descending colon: Secondary | ICD-10-CM

## 2017-03-21 DIAGNOSIS — Z79899 Other long term (current) drug therapy: Secondary | ICD-10-CM | POA: Diagnosis not present

## 2017-03-21 DIAGNOSIS — Z85828 Personal history of other malignant neoplasm of skin: Secondary | ICD-10-CM | POA: Insufficient documentation

## 2017-03-21 DIAGNOSIS — Z87442 Personal history of urinary calculi: Secondary | ICD-10-CM | POA: Diagnosis not present

## 2017-03-21 DIAGNOSIS — I10 Essential (primary) hypertension: Secondary | ICD-10-CM | POA: Diagnosis not present

## 2017-03-21 LAB — URINALYSIS, COMPLETE (UACMP) WITH MICROSCOPIC
BACTERIA UA: NONE SEEN
Bilirubin Urine: NEGATIVE
Glucose, UA: NEGATIVE mg/dL
HGB URINE DIPSTICK: NEGATIVE
Ketones, ur: NEGATIVE mg/dL
Leukocytes, UA: NEGATIVE
Nitrite: NEGATIVE
PROTEIN: NEGATIVE mg/dL
RBC / HPF: NONE SEEN RBC/hpf (ref 0–5)
Specific Gravity, Urine: 1.009 (ref 1.005–1.030)
pH: 6 (ref 5.0–8.0)

## 2017-03-21 LAB — COMPREHENSIVE METABOLIC PANEL
ALK PHOS: 90 U/L (ref 38–126)
ALT: 26 U/L (ref 17–63)
ANION GAP: 8 (ref 5–15)
AST: 34 U/L (ref 15–41)
Albumin: 3.7 g/dL (ref 3.5–5.0)
BILIRUBIN TOTAL: 0.5 mg/dL (ref 0.3–1.2)
BUN: 6 mg/dL (ref 6–20)
CALCIUM: 8.9 mg/dL (ref 8.9–10.3)
CO2: 24 mmol/L (ref 22–32)
CREATININE: 0.96 mg/dL (ref 0.61–1.24)
Chloride: 106 mmol/L (ref 101–111)
GFR calc Af Amer: >60 mL/min (ref >60)
Glucose, Bld: 113 mg/dL — ABNORMAL HIGH (ref 65–99)
Potassium: 3.2 mmol/L — ABNORMAL LOW (ref 3.5–5.1)
Sodium: 138 mmol/L (ref 135–145)
TOTAL PROTEIN: 7.2 g/dL (ref 6.5–8.1)

## 2017-03-21 LAB — CBC WITH DIFFERENTIAL/PLATELET
Basophils Absolute: 0.1 10*3/uL (ref 0–0.1)
Basophils Relative: 1 %
EOS ABS: 0.2 10*3/uL (ref 0–0.7)
Eosinophils Relative: 2 %
HCT: 37 % — ABNORMAL LOW (ref 40.0–52.0)
HEMOGLOBIN: 12.6 g/dL — AB (ref 13.0–18.0)
LYMPHS ABS: 1.8 10*3/uL (ref 1.0–3.6)
LYMPHS PCT: 24 %
MCH: 29.7 pg (ref 26.0–34.0)
MCHC: 34.2 g/dL (ref 32.0–36.0)
MCV: 87 fL (ref 80.0–100.0)
MONOS PCT: 10 %
Monocytes Absolute: 0.7 10*3/uL (ref 0.2–1.0)
NEUTROS PCT: 63 %
Neutro Abs: 4.5 10*3/uL (ref 1.4–6.5)
Platelets: 230 10*3/uL (ref 150–440)
RBC: 4.25 MIL/uL — ABNORMAL LOW (ref 4.40–5.90)
RDW: 17.9 % — ABNORMAL HIGH (ref 11.5–14.5)
WBC: 7.2 10*3/uL (ref 3.8–10.6)

## 2017-03-21 LAB — MAGNESIUM: Magnesium: 1.8 mg/dL (ref 1.7–2.4)

## 2017-03-21 MED ORDER — PALONOSETRON HCL INJECTION 0.25 MG/5ML
0.2500 mg | Freq: Once | INTRAVENOUS | Status: AC
  Administered 2017-03-21: 0.25 mg via INTRAVENOUS

## 2017-03-21 MED ORDER — ATROPINE SULFATE 1 MG/ML IJ SOLN: 0.5000 mg | Freq: Once | INTRAMUSCULAR | Status: DC | PRN

## 2017-03-21 MED ORDER — SODIUM CHLORIDE 0.9 % IV SOLN
Freq: Once | INTRAVENOUS | Status: AC
  Administered 2017-03-21: 11:00:00 via INTRAVENOUS
  Filled 2017-03-21: qty 1000

## 2017-03-21 MED ORDER — LEUCOVORIN CALCIUM INJECTION 100 MG
20.0000 mg/m2 | Freq: Once | INTRAMUSCULAR | Status: AC
  Administered 2017-03-21: 50 mg via INTRAVENOUS
  Filled 2017-03-21: qty 2.5

## 2017-03-21 MED ORDER — OXYCODONE HCL 10 MG PO TABS: 10.0000 mg | ORAL_TABLET | Freq: Four times a day (QID) | ORAL | 0 refills | Status: DC | PRN

## 2017-03-21 MED ORDER — LORAZEPAM 0.5 MG PO TABS: ORAL_TABLET | ORAL | 3 refills | Status: DC

## 2017-03-21 MED ORDER — LEUCOVORIN CALCIUM INJECTION 350 MG: 400.0000 mg/m2 | Freq: Once | INTRAVENOUS | Status: DC

## 2017-03-21 MED ORDER — DEXAMETHASONE SODIUM PHOSPHATE 10 MG/ML IJ SOLN
10.0000 mg | Freq: Once | INTRAMUSCULAR | Status: AC
  Administered 2017-03-21: 10 mg via INTRAVENOUS

## 2017-03-21 MED ORDER — SODIUM CHLORIDE 0.9% FLUSH
10.0000 mL | INTRAVENOUS | Status: DC | PRN
  Administered 2017-03-21: 10 mL via INTRAVENOUS
  Filled 2017-03-21: qty 10

## 2017-03-21 MED ORDER — SODIUM CHLORIDE 0.9 % IV SOLN
600.0000 mg | Freq: Once | INTRAVENOUS | Status: AC
  Administered 2017-03-21: 600 mg via INTRAVENOUS
  Filled 2017-03-21: qty 16

## 2017-03-21 MED ORDER — HEPARIN SOD (PORK) LOCK FLUSH 100 UNIT/ML IV SOLN: 500.0000 [IU] | Freq: Once | INTRAVENOUS | Status: DC

## 2017-03-21 MED ORDER — SODIUM CHLORIDE 0.9 % IV SOLN
2400.0000 mg/m2 | INTRAVENOUS | Status: DC
  Administered 2017-03-21: 6000 mg via INTRAVENOUS
  Filled 2017-03-21: qty 120

## 2017-03-21 NOTE — Progress Notes (Signed)
Per patient, he has been unable to tolerate oral potassium tablets. States that he gets a migraine headache after taking the tablets.

## 2017-03-21 NOTE — Assessment & Plan Note (Addendum)
#    Colon cancer with metastases- peritoneal/omental metastases-status post small bowel resection; liver metastases on FOLFIRI s/p # 6 cycles- May 14th CT- improved omental; lung nodules; STABLE liver lesions. Currently on pallaitive chemotherapy on 5-FU-Avastin.   # continue 5FU-Avastin every 2 weeks; patient's tolerance to chemotherapy has improved since been taken off irinotecan. Labs today reviewed;  acceptable for treatment today.   # Blood press elevated/ repeat 128/90- okay with avatsin. Recommend keeping a log of blood pressures at home. To bring it next visit.  # diarrhea- G-2 / colostomy-improved/ resolved.  # pain management/chronic pain-STABLE; Continue  fentanyl 75 g every 72 hours and  oxycodone every 6-8 hours.New prescription given.  -# Hypokalemia- today 3.2; not too keen on supplementation. Increase dietary supp.   # Hypomagnesemia magnesium 1.8; improved.  #  Will order CEA today; follow up 2 weeks/4 weeks/ chemo.

## 2017-03-21 NOTE — Progress Notes (Signed)
[facility] OFFICE PROGRESS NOTE  Patient Care Team: [name], MD as PCP - General (Family Medicine) [name], MD as Consulting Physician (General Surgery) Cammie Sickle, MD as Consulting Physician (Oncology) Molli Barrows, MD as Consulting Physician (Pain Medicine) Festus Aloe, MD as Consulting Physician (Urology) Lucilla Lame, MD as Consulting Physician (Gastroenterology) Laurence Spates, MD as Consulting Physician (Gastroenterology)  Cancer Staging Cancer of descending colon metastatic to intra-abdominal lymph node  Staging form: Colon and Rectum, AJCC 7th Edition - Clinical: Stage IVB (T4b, N1, M1b) - Signed by Forest Gleason, MD on 05/20/2015 - Pathologic: No stage assigned - Unsigned    Oncology History   1.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 Specimen integrity: Intact. Macroscopic tumor perforation: Not identified. Invasive tumor: Maximum size: 5.5 cm. Histologic type(s): Invasive adenocarcinoma. Histologic grade and differentiation: G1: well differentiated/low grade  Pathologic Staging: pT4b, pN1b, pM1b . 2.Patient was started on FOLFOX in September of 2016,after 2 cycles of chemotherapy patient developed neuropathy. Patient had a previous neuropathy as a baseline which increased so was switched over to FOLFIRI I IN December of 2016 As patient has clear os wild-type we can proceed to add either a Avastin or cetuximab from the next chemotherapy.(November, 2016)  Avastin was added as abdominal wound has completely healed now Avastin on hold (December, 2016) because of hypertension Chemotherapy was discontinued after protocol 8 cycle because of significant side effect (last chemotherapy was on October 28, 2015).  # DEC 2017- small bowel obstruction s/p resection [Dr.Hoxworth]- positive for recurrence. 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 ? 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  male  with above history of recurrent metastatic colon cancer to the liver/omentum on palliative chemo- Currently on 5-FU and Avastin is here for follow-up  Patient's chemotherapy 2 weeks ago was held because of his vacation plans.  Patient has noted significant improvement of his quality-of-life- off irinotecan. Denies having any significant diarrhea-on 5-FU and Avastin. He denies any significant worsening headaches. Denies any nosebleeds.   Complains of fatigue. Intermittent nausea with vomiting. Patient's chronic pain is improved /stable on  oxycodone and fentanyl patches. Mild sores in the mouth. Improved.    REVIEW OF SYSTEMS:  A complete 10 point review of system is done which is negative except mentioned above/history of present illness.   PAST MEDICAL HISTORY :  Past Medical History:  Diagnosis Date  . Abdominal pain    mid abdomen  . Adiposity 03/21/2015  . Arthritis   . 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 Colon cancer ([place])   . 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 Extreme obesity 12/21/2012  . GERD (gastroesophageal reflux disease)   . History of kidney stones   . History of transfusion   . Hypertension    has been off BP med x 6 months  . Iron deficiency anemia due to chronic blood loss 05/08/2015  . LBP (low  back pain) 05/28/2014  . Neuropathy   . PONV (postoperative nausea and vomiting)   . Sarcoid 10/2012  . Skin cancer     "it flairs up with sarcoidosis"    PAST SURGICAL HISTORY :   Past Surgical History:  Procedure Laterality Date  . CARDIAC CATHETERIZATION    . FLEXIBLE SIGMOIDOSCOPY N/A 05/07/2015   Procedure: FLEXIBLE SIGMOIDOSCOPY;  Surgeon: Laurence Spates, MD;  Location: [facility];  Service: Endoscopy;  Laterality: N/A;  . LAPAROSCOPIC PARTIAL COLECTOMY N/A 05/07/2015   Procedure: LAPAROSCOPIC ASSITED PARTIAL COLECTOMY WITH COLOSTOMY, SMALL BOWEL RESECTION, EXCISION OF PERITONEAL NODULE;  Surgeon: [name], MD;  Location: [facility];  Service: General;  Laterality: N/A;  . LAPAROTOMY N/A 08/10/2016   Procedure: EXPLORATORY LAPAROTOMY small bowel resection abdominal wall resection partial omentectomy;  Surgeon: Excell Seltzer, MD;  Location: [facility];  Service: General;  Laterality: N/A;  . PORTACATH PLACEMENT Right 05/27/2015   Procedure: INSERTION PORT-A-CATH;  Surgeon: [name], MD;  Location: [facility];  Service: General;  Laterality: Right;    FAMILY HISTORY :   Family History  Problem Relation Age of Onset  . Arthritis Mother   . Heart disease Mother   . Stroke Father   . Prostate cancer Neg Hx   . Bladder Cancer Neg Hx     SOCIAL HISTORY:   Social History  Substance Use Topics  . Smoking status: Passive Smoke Exposure - Never Smoker  . Smokeless tobacco: Never Used  . Alcohol use No    ALLERGIES:  is allergic to versed [midazolam]; gabapentin; paba derivatives; and benzyl benzoate.  MEDICATIONS:  Current Outpatient Prescriptions  Medication Sig Dispense Refill  . diphenoxylate-atropine (LOMOTIL) 2.5-0.025 MG tablet Take 1 tablet by mouth 4 (four) times daily as needed for diarrhea or loose stools. 30 tablet 3  . fentaNYL (DURAGESIC - DOSED MCG/HR) 75 MCG/HR Place 1 patch (75 mcg total) onto the skin every 3 (three) days. 10 patch 0  . lidocaine-prilocaine (EMLA) cream Apply 1 application topically as needed. Apply small amount to port site at least 1 hour prior to it being  accessed, cover with plastic wrap 30 g 1  . loperamide (IMODIUM A-D) 2 MG tablet Take 2 mg by mouth 4 (four) times daily as needed for diarrhea or loose stools.    . prochlorperazine (COMPAZINE) 10 MG tablet Take 1 tablet (10 mg total) by mouth every 6 (six) hours as needed for nausea or vomiting. 60 tablet 2  . promethazine (PHENERGAN) 25 MG tablet Take 1 tablet (25 mg total) by mouth every 6 (six) hours as needed. 60 tablet 6  . sucralfate (CARAFATE) 1 g tablet Take 1 tablet (1 g total) by mouth 4 (four) times daily. 120 tablet 1  . zolpidem (AMBIEN) 10 MG tablet Take 10-15 mg by mouth at bedtime as needed for sleep.    Marland Kitchen LORazepam (ATIVAN) 0.5 MG tablet Take every 8 hours as needed for nausea/anxiety. 90 tablet 3  . Oxycodone HCl 10 MG TABS Take 1 tablet (10 mg total) by mouth every 6 (six) hours as needed (severe pain). 60 tablet 0   No current facility-administered medications for this visit.    Facility-Administered Medications Ordered in Other Visits  Medication Dose Route Frequency Provider Last Rate Last Dose  . sodium chloride flush (NS) 0.9 % injection 10 mL  10 mL Intravenous PRN Forest Gleason, MD   10 mL at 02/11/16 1326    PHYSICAL EXAMINATION: ECOG PERFORMANCE STATUS: 0 - Asymptomatic  BP  128/90 Comment: manual bp-  Pulse 94   Temp 97.8 F (36.6 C) (Tympanic)   Resp 20   Ht 5' 11"  (1.803 m)   Wt 273 lb (123.8 kg)   BMI 38.08 kg/m   Filed Weights   03/21/17 0950  Weight: 273 lb (123.8 kg)    GENERAL: Obese Caucasian male patient. Alert, no distress and comfortable.  Obese. Accompanied by is wife. Walking by himself.  EYES: no pallor or icterus OROPHARYNX: no thrush or ulceration; good dentition  NECK: supple, no masses felt LYMPH:  no palpable lymphadenopathy in the cervical, axillary or inguinal regions LUNGS: clear to auscultation and  No wheeze or crackles HEART/CVS: regular rate & rhythm and no murmurs; No lower extremity edema ABDOMEN:abdomen soft,  non-tender and normal bowel sounds; Positive for colostomy. Incisions were healing. Musculoskeletal:no cyanosis of digits and no clubbing  PSYCH: alert & oriented x 3 with fluent speech.  NEURO: no focal motor/sensory deficits SKIN:  no rashes or significant lesions  LABORATORY DATA:  I have reviewed the data as listed    Component Value Date/Time   NA 138 03/21/2017 0913   NA 138 01/02/2015 1555   K 3.2 (L) 03/21/2017 0913   K 3.7 01/02/2015 1555   CL 106 03/21/2017 0913   CL 109 01/02/2015 1555   CO2 24 03/21/2017 0913   CO2 22 01/02/2015 1555   GLUCOSE 113 (H) 03/21/2017 0913   GLUCOSE 110 (H) 01/02/2015 1555   BUN 6 03/21/2017 0913   BUN 9 01/02/2015 1555   CREATININE 0.96 03/21/2017 0913   CREATININE 1.04 01/02/2015 1555   CALCIUM 8.9 03/21/2017 0913   CALCIUM 8.8 (L) 01/02/2015 1555   PROT 7.2 03/21/2017 0913   PROT 8.2 (H) 01/02/2015 1555   ALBUMIN 3.7 03/21/2017 0913   ALBUMIN 4.0 01/02/2015 1555   AST 34 03/21/2017 0913   AST 39 01/02/2015 1555   ALT 26 03/21/2017 0913   ALT 55 01/02/2015 1555   ALKPHOS 90 03/21/2017 0913   ALKPHOS 125 01/02/2015 1555   BILITOT 0.5 03/21/2017 0913   BILITOT 0.5 01/02/2015 1555   GFRNONAA >60 03/21/2017 0913   GFRNONAA >60 01/02/2015 1555   GFRAA >60 03/21/2017 0913   GFRAA >60 01/02/2015 1555    No results found for: SPEP, UPEP  Lab Results  Component Value Date   WBC 7.2 03/21/2017   NEUTROABS 4.5 03/21/2017   HGB 12.6 (L) 03/21/2017   HCT 37.0 (L) 03/21/2017   MCV 87.0 03/21/2017   PLT 230 03/21/2017      Chemistry      Component Value Date/Time   NA 138 03/21/2017 0913   NA 138 01/02/2015 1555   K 3.2 (L) 03/21/2017 0913   K 3.7 01/02/2015 1555   CL 106 03/21/2017 0913   CL 109 01/02/2015 1555   CO2 24 03/21/2017 0913   CO2 22 01/02/2015 1555   BUN 6 03/21/2017 0913   BUN 9 01/02/2015 1555   CREATININE 0.96 03/21/2017 0913   CREATININE 1.04 01/02/2015 1555      Component Value Date/Time   CALCIUM  8.9 03/21/2017 0913   CALCIUM 8.8 (L) 01/02/2015 1555   ALKPHOS 90 03/21/2017 0913   ALKPHOS 125 01/02/2015 1555   AST 34 03/21/2017 0913   AST 39 01/02/2015 1555   ALT 26 03/21/2017 0913   ALT 55 01/02/2015 1555   BILITOT 0.5 03/21/2017 0913   BILITOT 0.5 01/02/2015 1555     IMPRESSION: 1. Unfortunately there is new  metastatic disease to the right lung, extensive metastatic disease to the liver, and metastatic disease to the omentum, peritoneal margin, and mesentery. 2. Other imaging findings of potential clinical significance: Coronary atherosclerosis. 3. Bilateral nonobstructive nephrolithiasis.   Electronically Signed   By: Van Clines M.D.   On: 10/14/2016 13:33  RADIOGRAPHIC STUDIES: I have personally reviewed the radiological images as listed and agreed with the findings in the report. No results found.   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 Currently on pallaitive chemotherapy on 5-FU-Avastin.   # continue 5FU-Avastin every 2 weeks; patient's tolerance to chemotherapy has improved since been taken off irinotecan. Labs today reviewed;  acceptable for treatment today.   # Blood press elevated/ repeat 128/90- okay with avatsin. Recommend keeping a log of blood pressures at home. To bring it next visit.  # diarrhea- G-2 / colostomy-improved/ resolved.  # pain management/chronic pain-STABLE; Continue  fentanyl 75 g every 72 hours and  oxycodone every 6-8 hours.New prescription given.  -# Hypokalemia- today 3.2; not too keen on supplementation. Increase dietary supp.   # Hypomagnesemia magnesium 1.8; improved.  #  Will order CEA today; follow up 2 weeks/4 weeks/ chemo.   Orders Placed This Encounter   Procedures  . CBC with Differential/Platelet    Standing Status:   Standing    Number of Occurrences:   20    Standing Expiration Date:   03/21/2018  . Comprehensive metabolic panel    Standing Status:   Standing    Number of Occurrences:   20    Standing Expiration Date:   03/21/2018  . CEA    Standing Status:   Future    Number of Occurrences:   1    Standing Expiration Date:   03/21/2018  . Urinalysis, Complete w Microscopic    Standing Status:   Standing    Number of Occurrences:   20    Standing Expiration Date:   09/21/2017  . CEA    Standing Status:   Standing    Number of Occurrences:   10    Standing Expiration Date:   03/21/2018       Cammie Sickle, MD 03/21/2017 7:55 PM

## 2017-03-22 LAB — CEA: CEA: 46.3 ng/mL — ABNORMAL HIGH (ref 0.0–4.7)

## 2017-03-23 ENCOUNTER — Inpatient Hospital Stay: Payer: BLUE CROSS/BLUE SHIELD

## 2017-03-23 DIAGNOSIS — C186 Malignant neoplasm of descending colon: Secondary | ICD-10-CM

## 2017-03-23 DIAGNOSIS — C772 Secondary and unspecified malignant neoplasm of intra-abdominal lymph nodes: Principal | ICD-10-CM

## 2017-03-23 MED ORDER — SODIUM CHLORIDE 0.9% FLUSH
10.0000 mL | INTRAVENOUS | Status: DC | PRN
  Administered 2017-03-23: 10 mL
  Filled 2017-03-23: qty 10

## 2017-03-23 MED ORDER — HEPARIN SOD (PORK) LOCK FLUSH 100 UNIT/ML IV SOLN
500.0000 [IU] | Freq: Once | INTRAVENOUS | Status: AC | PRN
  Administered 2017-03-23: 500 [IU]

## 2017-03-28 ENCOUNTER — Other Ambulatory Visit: Payer: Self-pay | Admitting: *Deleted

## 2017-03-28 MED ORDER — SUCRALFATE 1 G PO TABS: 1.0000 g | ORAL_TABLET | Freq: Four times a day (QID) | ORAL | 1 refills | Status: DC

## 2017-04-04 ENCOUNTER — Inpatient Hospital Stay (HOSPITAL_BASED_OUTPATIENT_CLINIC_OR_DEPARTMENT_OTHER): Payer: BLUE CROSS/BLUE SHIELD | Admitting: Internal Medicine

## 2017-04-04 ENCOUNTER — Inpatient Hospital Stay: Payer: BLUE CROSS/BLUE SHIELD

## 2017-04-04 VITALS — BP 148/103 | HR 107 | Temp 99.9°F | Resp 18 | Ht 71.0 in | Wt 286.0 lb

## 2017-04-04 DIAGNOSIS — Z933 Colostomy status: Secondary | ICD-10-CM

## 2017-04-04 DIAGNOSIS — Z79899 Other long term (current) drug therapy: Secondary | ICD-10-CM | POA: Diagnosis not present

## 2017-04-04 DIAGNOSIS — C186 Malignant neoplasm of descending colon: Secondary | ICD-10-CM | POA: Diagnosis not present

## 2017-04-04 DIAGNOSIS — E876 Hypokalemia: Secondary | ICD-10-CM | POA: Diagnosis not present

## 2017-04-04 DIAGNOSIS — C787 Secondary malignant neoplasm of liver and intrahepatic bile duct: Secondary | ICD-10-CM | POA: Diagnosis not present

## 2017-04-04 DIAGNOSIS — C786 Secondary malignant neoplasm of retroperitoneum and peritoneum: Secondary | ICD-10-CM

## 2017-04-04 DIAGNOSIS — C772 Secondary and unspecified malignant neoplasm of intra-abdominal lymph nodes: Secondary | ICD-10-CM

## 2017-04-04 DIAGNOSIS — G893 Neoplasm related pain (acute) (chronic): Secondary | ICD-10-CM

## 2017-04-04 LAB — URINALYSIS, COMPLETE (UACMP) WITH MICROSCOPIC
BACTERIA UA: NONE SEEN
BILIRUBIN URINE: NEGATIVE
Glucose, UA: NEGATIVE mg/dL
Hgb urine dipstick: NEGATIVE
KETONES UR: NEGATIVE mg/dL
LEUKOCYTES UA: NEGATIVE
Nitrite: NEGATIVE
PH: 7 (ref 5.0–8.0)
Protein, ur: NEGATIVE mg/dL
Specific Gravity, Urine: 1.011 (ref 1.005–1.030)

## 2017-04-04 LAB — COMPREHENSIVE METABOLIC PANEL
ALBUMIN: 3.5 g/dL (ref 3.5–5.0)
ALK PHOS: 87 U/L (ref 38–126)
ALT: 21 U/L (ref 17–63)
ANION GAP: 6 (ref 5–15)
AST: 30 U/L (ref 15–41)
BILIRUBIN TOTAL: 0.6 mg/dL (ref 0.3–1.2)
BUN: 7 mg/dL (ref 6–20)
CALCIUM: 8.5 mg/dL — AB (ref 8.9–10.3)
CO2: 25 mmol/L (ref 22–32)
Chloride: 107 mmol/L (ref 101–111)
Creatinine, Ser: 1 mg/dL (ref 0.61–1.24)
GFR calc Af Amer: >60 mL/min (ref >60)
GFR calc non Af Amer: >60 mL/min (ref >60)
GLUCOSE: 115 mg/dL — AB (ref 65–99)
POTASSIUM: 3.3 mmol/L — AB (ref 3.5–5.1)
SODIUM: 138 mmol/L (ref 135–145)
TOTAL PROTEIN: 6.7 g/dL (ref 6.5–8.1)

## 2017-04-04 LAB — CBC WITH DIFFERENTIAL/PLATELET
BASOS ABS: 0.1 10*3/uL (ref 0–0.1)
BASOS PCT: 1 %
EOS ABS: 0.1 10*3/uL (ref 0–0.7)
Eosinophils Relative: 1 %
HEMATOCRIT: 35.9 % — AB (ref 40.0–52.0)
HEMOGLOBIN: 12.3 g/dL — AB (ref 13.0–18.0)
Lymphocytes Relative: 13 %
Lymphs Abs: 1.2 10*3/uL (ref 1.0–3.6)
MCH: 29.9 pg (ref 26.0–34.0)
MCHC: 34.2 g/dL (ref 32.0–36.0)
MCV: 87.5 fL (ref 80.0–100.0)
Monocytes Absolute: 0.8 10*3/uL (ref 0.2–1.0)
Monocytes Relative: 8 %
NEUTROS ABS: 7 10*3/uL — AB (ref 1.4–6.5)
NEUTROS PCT: 77 %
Platelets: 174 10*3/uL (ref 150–440)
RBC: 4.1 MIL/uL — ABNORMAL LOW (ref 4.40–5.90)
RDW: 17 % — ABNORMAL HIGH (ref 11.5–14.5)
WBC: 9.1 10*3/uL (ref 3.8–10.6)

## 2017-04-04 MED ORDER — PALONOSETRON HCL INJECTION 0.25 MG/5ML
0.2500 mg | Freq: Once | INTRAVENOUS | Status: AC
  Administered 2017-04-04: 0.25 mg via INTRAVENOUS
  Filled 2017-04-04: qty 5

## 2017-04-04 MED ORDER — LEUCOVORIN CALCIUM INJECTION 100 MG
20.0000 mg/m2 | Freq: Once | INTRAMUSCULAR | Status: AC
  Administered 2017-04-04: 50 mg via INTRAVENOUS
  Filled 2017-04-04: qty 2.5

## 2017-04-04 MED ORDER — SODIUM CHLORIDE 0.9 % IV SOLN
Freq: Once | INTRAVENOUS | Status: AC
Start: 2017-04-04 — End: 2017-04-04
  Administered 2017-04-04: 11:00:00 via INTRAVENOUS
  Filled 2017-04-04: qty 1000

## 2017-04-04 MED ORDER — SODIUM CHLORIDE 0.9 % IV SOLN
2400.0000 mg/m2 | INTRAVENOUS | Status: DC
  Administered 2017-04-04: 6000 mg via INTRAVENOUS
  Filled 2017-04-04: qty 120

## 2017-04-04 MED ORDER — OXYCODONE HCL 10 MG PO TABS: 10.0000 mg | ORAL_TABLET | Freq: Four times a day (QID) | ORAL | 0 refills | Status: DC | PRN

## 2017-04-04 MED ORDER — DEXAMETHASONE SODIUM PHOSPHATE 10 MG/ML IJ SOLN
10.0000 mg | Freq: Once | INTRAMUSCULAR | Status: AC
  Administered 2017-04-04: 10 mg via INTRAVENOUS
  Filled 2017-04-04: qty 1

## 2017-04-04 NOTE — Progress Notes (Signed)
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 Specimen integrity: Intact. Macroscopic tumor perforation: Not identified. Invasive tumor: Maximum size: 5.5 cm. Histologic type(s): Invasive adenocarcinoma. Histologic grade and differentiation: G1: well differentiated/low grade  Pathologic Staging: pT4b, pN1b, pM1b . 2.Patient was started on FOLFOX in September of 2016,after 2 cycles of chemotherapy patient developed neuropathy. Patient had a previous neuropathy as a baseline which increased so was switched over to FOLFIRI I IN December of 2016 As patient has clear os wild-type we can proceed to add either a Avastin or cetuximab from the next chemotherapy.(November, 2016)  Avastin was added as abdominal wound has completely healed now Avastin on hold (December, 2016) because of hypertension Chemotherapy was discontinued after protocol 8 cycle because of significant side effect (last chemotherapy was on October 28, 2015).  # DEC 2017- small bowel obstruction s/p resection [Dr.Hoxworth]- positive for recurrence. 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 ? 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  male  with above history of recurrent metastatic colon cancer to the liver/omentum on palliative chemo- Currently on 5-FU and Avastin is here for follow-up.   Patient's diarrhea has resolved. His potassium was also good. Denies having any significant diarrhea-on 5-FU and Avastin. He denies any significant worsening headaches. Denies any nosebleeds. In general fatigue also improving. Denies any significant nausea or vomiting. Patient's chronic pain is improved /stable on  oxycodone and fentanyl patches.    REVIEW OF SYSTEMS:  A complete 10 point review of system is done which is negative except mentioned above/history of present illness.   PAST MEDICAL HISTORY :  Past Medical History:  Diagnosis Date  . Abdominal pain    mid abdomen  . Adiposity 03/21/2015  . Arthritis   . 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 Colon cancer (HCC)   . 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 Extreme obesity 12/21/2012  . GERD (gastroesophageal reflux disease)   . History of kidney stones   . History of transfusion   . Hypertension    has been off BP med x 6 months  . Iron deficiency anemia due to chronic blood loss 05/08/2015  . LBP (low back pain) 05/28/2014  . Neuropathy   . PONV (postoperative nausea and vomiting)   .   Sarcoid 10/2012  . Skin cancer    "it flairs up with sarcoidosis"    PAST SURGICAL HISTORY :   Past Surgical History:  Procedure Laterality Date   . CARDIAC CATHETERIZATION    . FLEXIBLE SIGMOIDOSCOPY N/A 05/07/2015   Procedure: FLEXIBLE SIGMOIDOSCOPY;  Surgeon: James Edwards, MD;  Location: [facility];  Service: Endoscopy;  Laterality: N/A;  . LAPAROSCOPIC PARTIAL COLECTOMY N/A 05/07/2015   Procedure: LAPAROSCOPIC ASSITED PARTIAL COLECTOMY WITH COLOSTOMY, SMALL BOWEL RESECTION, EXCISION OF PERITONEAL NODULE;  Surgeon: Todd Rosenbower, MD;  Location: [facility];  Service: General;  Laterality: N/A;  . LAPAROTOMY N/A 08/10/2016   Procedure: EXPLORATORY LAPAROTOMY small bowel resection abdominal wall resection partial omentectomy;  Surgeon: Benjamin Hoxworth, MD;  Location: [facility];  Service: General;  Laterality: N/A;  . PORTACATH PLACEMENT Right 05/27/2015   Procedure: INSERTION PORT-A-CATH;  Surgeon: Todd Rosenbower, MD;  Location: [facility];  Service: General;  Laterality: Right;    FAMILY HISTORY :   Family History  Problem Relation Age of Onset  . Arthritis Mother   . Heart disease Mother   . Stroke Father   . Prostate cancer Neg Hx   . Bladder Cancer Neg Hx     SOCIAL HISTORY:   Social History  Substance Use Topics  . Smoking status: Passive Smoke Exposure - Never Smoker  . Smokeless tobacco: Never Used  . Alcohol use No    ALLERGIES:  is allergic to versed [midazolam]; gabapentin; paba derivatives; and benzyl benzoate.  MEDICATIONS:  Current Outpatient Prescriptions  Medication Sig Dispense Refill  . diphenoxylate-atropine (LOMOTIL) 2.5-0.025 MG tablet Take 1 tablet by mouth 4 (four) times daily as needed for diarrhea or loose stools. 30 tablet 3  . fentaNYL (DURAGESIC - DOSED MCG/HR) 75 MCG/HR Place 1 patch (75 mcg total) onto the skin every 3 (three) days. 10 patch 0  . lidocaine-prilocaine (EMLA) cream Apply 1 application topically as needed. Apply small amount to port site at least 1 hour prior to it being accessed, cover with plastic wrap 30 g 1  . loperamide (IMODIUM A-D) 2 MG tablet Take 2 mg by mouth 4 (four) times  daily as needed for diarrhea or loose stools.    . LORazepam (ATIVAN) 0.5 MG tablet Take every 8 hours as needed for nausea/anxiety. 90 tablet 3  . Oxycodone HCl 10 MG TABS Take 1 tablet (10 mg total) by mouth every 6 (six) hours as needed (severe pain). 60 tablet 0  . prochlorperazine (COMPAZINE) 10 MG tablet Take 1 tablet (10 mg total) by mouth every 6 (six) hours as needed for nausea or vomiting. 60 tablet 2  . promethazine (PHENERGAN) 25 MG tablet Take 1 tablet (25 mg total) by mouth every 6 (six) hours as needed. 60 tablet 6  . sucralfate (CARAFATE) 1 g tablet Take 1 tablet (1 g total) by mouth 4 (four) times daily. 120 tablet 1  . zolpidem (AMBIEN) 10 MG tablet Take 10-15 mg by mouth at bedtime as needed for sleep.     No current facility-administered medications for this visit.    Facility-Administered Medications Ordered in Other Visits  Medication Dose Route Frequency Provider Last Rate Last Dose  . fluorouracil (ADRUCIL) 6,000 mg in sodium chloride 0.9 % 130 mL chemo infusion  2,400 mg/m2 (Treatment Plan Recorded) Intravenous 1 day or 1 dose Brahmanday, Govinda R, MD   6,000 mg at 04/04/17 1150  . sodium chloride flush (NS) 0.9 % injection 10 mL  10 mL Intravenous PRN Choksi, Janak,   MD   10 mL at 02/11/16 1326    PHYSICAL EXAMINATION: ECOG PERFORMANCE STATUS: 0 - Asymptomatic  BP (!) 148/103 (Patient Position: Sitting)   Pulse (!) 107   Temp 99.9 F (37.7 C) (Tympanic)   Resp 18   Ht 5' 11" (1.803 m)   Wt 286 lb (129.7 kg)   BMI 39.89 kg/m   Filed Weights   04/04/17 1005  Weight: 286 lb (129.7 kg)    GENERAL: Obese Caucasian male patient. Alert, no distress and comfortable.  Obese. Accompanied by is wife. Walking by himself.  EYES: no pallor or icterus OROPHARYNX: no thrush or ulceration; good dentition  NECK: supple, no masses felt LYMPH:  no palpable lymphadenopathy in the cervical, axillary or inguinal regions LUNGS: clear to auscultation and  No wheeze or  crackles HEART/CVS: regular rate & rhythm and no murmurs; No lower extremity edema ABDOMEN:abdomen soft, non-tender and normal bowel sounds; Positive for colostomy. Incisions were healing. Musculoskeletal:no cyanosis of digits and no clubbing  PSYCH: alert & oriented x 3 with fluent speech.  NEURO: no focal motor/sensory deficits SKIN:  no rashes or significant lesions  LABORATORY DATA:  I have reviewed the data as listed    Component Value Date/Time   NA 138 04/04/2017 0923   NA 138 01/02/2015 1555   K 3.3 (L) 04/04/2017 0923   K 3.7 01/02/2015 1555   CL 107 04/04/2017 0923   CL 109 01/02/2015 1555   CO2 25 04/04/2017 0923   CO2 22 01/02/2015 1555   GLUCOSE 115 (H) 04/04/2017 0923   GLUCOSE 110 (H) 01/02/2015 1555   BUN 7 04/04/2017 0923   BUN 9 01/02/2015 1555   CREATININE 1.00 04/04/2017 0923   CREATININE 1.04 01/02/2015 1555   CALCIUM 8.5 (L) 04/04/2017 0923   CALCIUM 8.8 (L) 01/02/2015 1555   PROT 6.7 04/04/2017 0923   PROT 8.2 (H) 01/02/2015 1555   ALBUMIN 3.5 04/04/2017 0923   ALBUMIN 4.0 01/02/2015 1555   AST 30 04/04/2017 0923   AST 39 01/02/2015 1555   ALT 21 04/04/2017 0923   ALT 55 01/02/2015 1555   ALKPHOS 87 04/04/2017 0923   ALKPHOS 125 01/02/2015 1555   BILITOT 0.6 04/04/2017 0923   BILITOT 0.5 01/02/2015 1555   GFRNONAA >60 04/04/2017 0923   GFRNONAA >60 01/02/2015 1555   GFRAA >60 04/04/2017 0923   GFRAA >60 01/02/2015 1555    No results found for: SPEP, UPEP  Lab Results  Component Value Date   WBC 9.1 04/04/2017   NEUTROABS 7.0 (H) 04/04/2017   HGB 12.3 (L) 04/04/2017   HCT 35.9 (L) 04/04/2017   MCV 87.5 04/04/2017   PLT 174 04/04/2017      Chemistry      Component Value Date/Time   NA 138 04/04/2017 0923   NA 138 01/02/2015 1555   K 3.3 (L) 04/04/2017 0923   K 3.7 01/02/2015 1555   CL 107 04/04/2017 0923   CL 109 01/02/2015 1555   CO2 25 04/04/2017 0923   CO2 22 01/02/2015 1555   BUN 7 04/04/2017 0923   BUN 9 01/02/2015 1555    CREATININE 1.00 04/04/2017 0923   CREATININE 1.04 01/02/2015 1555      Component Value Date/Time   CALCIUM 8.5 (L) 04/04/2017 0923   CALCIUM 8.8 (L) 01/02/2015 1555   ALKPHOS 87 04/04/2017 0923   ALKPHOS 125 01/02/2015 1555   AST 30 04/04/2017 0923   AST 39 01/02/2015 1555   ALT 21 04/04/2017 0923     ALT 55 01/02/2015 1555   BILITOT 0.6 04/04/2017 0923   BILITOT 0.5 01/02/2015 1555     IMPRESSION: 1. Unfortunately there is new metastatic disease to the right lung, extensive metastatic disease to the liver, and metastatic disease to the omentum, peritoneal margin, and mesentery. 2. Other imaging findings of potential clinical significance: Coronary atherosclerosis. 3. Bilateral nonobstructive nephrolithiasis.   Electronically Signed   By: Walter  Liebkemann M.D.   On: 10/14/2016 13:33  RADIOGRAPHIC STUDIES: I have personally reviewed the radiological images as listed and agreed with the findings in the report. No results found.   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 Currently on pallaitive chemotherapy on 5-FU-Avastin.   # continue 5FU-Avastin every 2 weeks; patient's tolerance to chemotherapy has improved since been taken off irinotecan. Labs today reviewed;  acceptable for treatment today.   # Blood press elevated/ repeat 170s/100- HOLD avatsin. Recommend keeping a log of blood pressures at home. To bring it next visit.  # pain management/chronic pain-STABLE; Continue  fentanyl 75 g every 72 hours and  oxycodone every 6-8 hours. Refilled oxycodone.   -# Hypokalemia- today 3.3; not too keen on supplementation. Increase dietary supp.   # Hypomagnesemia magnesium 1.8; improved.  #   follow up 2 weeks/4 weeks/  chemo/labs- [facility]; 5FU-Avastin.   Orders Placed This Encounter  Procedures  . CBC with Differential    Standing Status:   Future    Standing Expiration Date:   04/04/2018  . Comprehensive metabolic panel    Standing Status:   Future    Standing Expiration Date:   04/04/2018  . Magnesium    Standing Status:   Future    Standing Expiration Date:   04/04/2018  . Urinalysis, Complete w Microscopic    Standing Status:   Future    Standing Expiration Date:   04/04/2018       Govinda R Brahmanday, MD 04/04/2017 1:00 PM 

## 2017-04-04 NOTE — Progress Notes (Signed)
Patient requesting RF on oxycodone today.

## 2017-04-04 NOTE — Assessment & Plan Note (Addendum)
#    Colon cancer with metastases- peritoneal/omental metastases-status post small bowel resection; liver metastases on FOLFIRI s/p # 6 cycles- May 14th CT- improved omental; lung nodules; STABLE liver lesions. Currently on pallaitive chemotherapy on 5-FU-Avastin.   # continue 5FU-Avastin every 2 weeks; patient's tolerance to chemotherapy has improved since been taken off irinotecan. Labs today reviewed;  acceptable for treatment today.   # Blood press elevated/ repeat 170s/100- HOLD avatsin. Recommend keeping a log of blood pressures at home. To bring it next visit.  # pain management/chronic pain-STABLE; Continue  fentanyl 75 g every 72 hours and  oxycodone every 6-8 hours. Refilled oxycodone.   -# Hypokalemia- today 3.3; not too keen on supplementation. Increase dietary supp.   # Hypomagnesemia magnesium 1.8; improved.  #   follow up 2 weeks/4 weeks/ chemo/labs- [facility]; 5FU-Avastin.

## 2017-04-06 ENCOUNTER — Inpatient Hospital Stay: Payer: BLUE CROSS/BLUE SHIELD

## 2017-04-06 VITALS — BP 138/88 | HR 90 | Temp 97.7°F | Resp 18

## 2017-04-06 DIAGNOSIS — C186 Malignant neoplasm of descending colon: Secondary | ICD-10-CM

## 2017-04-06 DIAGNOSIS — C772 Secondary and unspecified malignant neoplasm of intra-abdominal lymph nodes: Principal | ICD-10-CM

## 2017-04-06 MED ORDER — HEPARIN SOD (PORK) LOCK FLUSH 100 UNIT/ML IV SOLN
500.0000 [IU] | Freq: Once | INTRAVENOUS | Status: AC | PRN
  Administered 2017-04-06: 500 [IU]
  Filled 2017-04-06: qty 5

## 2017-04-06 MED ORDER — SODIUM CHLORIDE 0.9% FLUSH
10.0000 mL | INTRAVENOUS | Status: DC | PRN
  Administered 2017-04-06: 10 mL
  Filled 2017-04-06: qty 10

## 2017-04-12 ENCOUNTER — Other Ambulatory Visit: Payer: Self-pay | Admitting: Internal Medicine

## 2017-04-18 ENCOUNTER — Inpatient Hospital Stay: Payer: BLUE CROSS/BLUE SHIELD | Attending: Internal Medicine | Admitting: Internal Medicine

## 2017-04-18 ENCOUNTER — Inpatient Hospital Stay: Payer: BLUE CROSS/BLUE SHIELD

## 2017-04-18 VITALS — BP 117/87 | HR 103 | Temp 97.6°F | Resp 20 | Ht 71.0 in | Wt 287.6 lb

## 2017-04-18 DIAGNOSIS — E876 Hypokalemia: Secondary | ICD-10-CM | POA: Diagnosis not present

## 2017-04-18 DIAGNOSIS — C186 Malignant neoplasm of descending colon: Secondary | ICD-10-CM | POA: Diagnosis not present

## 2017-04-18 DIAGNOSIS — Z85828 Personal history of other malignant neoplasm of skin: Secondary | ICD-10-CM | POA: Insufficient documentation

## 2017-04-18 DIAGNOSIS — Z7722 Contact with and (suspected) exposure to environmental tobacco smoke (acute) (chronic): Secondary | ICD-10-CM | POA: Diagnosis not present

## 2017-04-18 DIAGNOSIS — Z87442 Personal history of urinary calculi: Secondary | ICD-10-CM | POA: Insufficient documentation

## 2017-04-18 DIAGNOSIS — C787 Secondary malignant neoplasm of liver and intrahepatic bile duct: Secondary | ICD-10-CM | POA: Insufficient documentation

## 2017-04-18 DIAGNOSIS — E669 Obesity, unspecified: Secondary | ICD-10-CM | POA: Diagnosis not present

## 2017-04-18 DIAGNOSIS — C772 Secondary and unspecified malignant neoplasm of intra-abdominal lymph nodes: Secondary | ICD-10-CM | POA: Insufficient documentation

## 2017-04-18 DIAGNOSIS — Z5111 Encounter for antineoplastic chemotherapy: Secondary | ICD-10-CM | POA: Insufficient documentation

## 2017-04-18 DIAGNOSIS — Z79899 Other long term (current) drug therapy: Secondary | ICD-10-CM | POA: Insufficient documentation

## 2017-04-18 DIAGNOSIS — D869 Sarcoidosis, unspecified: Secondary | ICD-10-CM | POA: Diagnosis not present

## 2017-04-18 DIAGNOSIS — R918 Other nonspecific abnormal finding of lung field: Secondary | ICD-10-CM | POA: Diagnosis not present

## 2017-04-18 DIAGNOSIS — I10 Essential (primary) hypertension: Secondary | ICD-10-CM | POA: Insufficient documentation

## 2017-04-18 DIAGNOSIS — G893 Neoplasm related pain (acute) (chronic): Secondary | ICD-10-CM | POA: Diagnosis not present

## 2017-04-18 DIAGNOSIS — K219 Gastro-esophageal reflux disease without esophagitis: Secondary | ICD-10-CM | POA: Insufficient documentation

## 2017-04-18 DIAGNOSIS — C786 Secondary malignant neoplasm of retroperitoneum and peritoneum: Secondary | ICD-10-CM | POA: Insufficient documentation

## 2017-04-18 LAB — URINALYSIS, COMPLETE (UACMP) WITH MICROSCOPIC
Bilirubin Urine: NEGATIVE
GLUCOSE, UA: NEGATIVE mg/dL
HGB URINE DIPSTICK: NEGATIVE
Ketones, ur: NEGATIVE mg/dL
LEUKOCYTES UA: NEGATIVE
NITRITE: NEGATIVE
Protein, ur: NEGATIVE mg/dL
RBC / HPF: NONE SEEN RBC/hpf (ref 0–5)
SPECIFIC GRAVITY, URINE: 1.02 (ref 1.005–1.030)
pH: 5 (ref 5.0–8.0)

## 2017-04-18 LAB — CBC WITH DIFFERENTIAL/PLATELET
BASOS ABS: 0.1 10*3/uL (ref 0–0.1)
Basophils Relative: 1 %
EOS PCT: 2 %
Eosinophils Absolute: 0.2 10*3/uL (ref 0–0.7)
HCT: 37.9 % — ABNORMAL LOW (ref 40.0–52.0)
Hemoglobin: 12.8 g/dL — ABNORMAL LOW (ref 13.0–18.0)
LYMPHS PCT: 24 %
Lymphs Abs: 1.8 10*3/uL (ref 1.0–3.6)
MCH: 30 pg (ref 26.0–34.0)
MCHC: 33.9 g/dL (ref 32.0–36.0)
MCV: 88.5 fL (ref 80.0–100.0)
MONO ABS: 0.7 10*3/uL (ref 0.2–1.0)
Monocytes Relative: 9 %
Neutro Abs: 4.9 10*3/uL (ref 1.4–6.5)
Neutrophils Relative %: 64 %
PLATELETS: 209 10*3/uL (ref 150–440)
RBC: 4.28 MIL/uL — ABNORMAL LOW (ref 4.40–5.90)
RDW: 16.9 % — AB (ref 11.5–14.5)
WBC: 7.7 10*3/uL (ref 3.8–10.6)

## 2017-04-18 LAB — COMPREHENSIVE METABOLIC PANEL
ALT: 25 U/L (ref 17–63)
AST: 33 U/L (ref 15–41)
Albumin: 3.6 g/dL (ref 3.5–5.0)
Alkaline Phosphatase: 92 U/L (ref 38–126)
Anion gap: 8 (ref 5–15)
BUN: 10 mg/dL (ref 6–20)
CHLORIDE: 107 mmol/L (ref 101–111)
CO2: 26 mmol/L (ref 22–32)
Calcium: 8.7 mg/dL — ABNORMAL LOW (ref 8.9–10.3)
Creatinine, Ser: 0.99 mg/dL (ref 0.61–1.24)
Glucose, Bld: 105 mg/dL — ABNORMAL HIGH (ref 65–99)
POTASSIUM: 3.4 mmol/L — AB (ref 3.5–5.1)
Sodium: 141 mmol/L (ref 135–145)
Total Bilirubin: 0.7 mg/dL (ref 0.3–1.2)
Total Protein: 7 g/dL (ref 6.5–8.1)

## 2017-04-18 LAB — MAGNESIUM: MAGNESIUM: 1.8 mg/dL (ref 1.7–2.4)

## 2017-04-18 MED ORDER — SODIUM CHLORIDE 0.9 % IV SOLN
Freq: Once | INTRAVENOUS | Status: AC
  Administered 2017-04-18: 10:00:00 via INTRAVENOUS
  Filled 2017-04-18: qty 1000

## 2017-04-18 MED ORDER — OXYCODONE HCL 10 MG PO TABS: 10.0000 mg | ORAL_TABLET | Freq: Four times a day (QID) | ORAL | 0 refills | Status: DC | PRN

## 2017-04-18 MED ORDER — HEPARIN SOD (PORK) LOCK FLUSH 100 UNIT/ML IV SOLN: 500.0000 [IU] | Freq: Once | INTRAVENOUS | Status: DC

## 2017-04-18 MED ORDER — PROCHLORPERAZINE MALEATE 10 MG PO TABS: 10.0000 mg | ORAL_TABLET | Freq: Four times a day (QID) | ORAL | 3 refills | Status: DC | PRN

## 2017-04-18 MED ORDER — SODIUM CHLORIDE 0.9 % IV SOLN
600.0000 mg | Freq: Once | INTRAVENOUS | Status: AC
  Administered 2017-04-18: 600 mg via INTRAVENOUS
  Filled 2017-04-18: qty 16

## 2017-04-18 MED ORDER — PALONOSETRON HCL INJECTION 0.25 MG/5ML
0.2500 mg | Freq: Once | INTRAVENOUS | Status: AC
  Administered 2017-04-18: 0.25 mg via INTRAVENOUS
  Filled 2017-04-18: qty 5

## 2017-04-18 MED ORDER — FENTANYL 75 MCG/HR TD PT72: 75.0000 ug | MEDICATED_PATCH | TRANSDERMAL | 0 refills | Status: DC

## 2017-04-18 MED ORDER — LEUCOVORIN CALCIUM INJECTION 100 MG
20.0000 mg/m2 | Freq: Once | INTRAMUSCULAR | Status: AC
  Administered 2017-04-18: 50 mg via INTRAVENOUS
  Filled 2017-04-18: qty 2.5

## 2017-04-18 MED ORDER — DEXAMETHASONE SODIUM PHOSPHATE 10 MG/ML IJ SOLN
10.0000 mg | Freq: Once | INTRAMUSCULAR | Status: AC
  Administered 2017-04-18: 10 mg via INTRAVENOUS
  Filled 2017-04-18: qty 1

## 2017-04-18 MED ORDER — SODIUM CHLORIDE 0.9% FLUSH
10.0000 mL | INTRAVENOUS | Status: DC | PRN
Start: 2017-04-18 — End: 2017-04-18
  Administered 2017-04-18: 10 mL via INTRAVENOUS
  Filled 2017-04-18: qty 10

## 2017-04-18 MED ORDER — SODIUM CHLORIDE 0.9 % IV SOLN
2400.0000 mg/m2 | INTRAVENOUS | Status: DC
  Administered 2017-04-18: 6000 mg via INTRAVENOUS
  Filled 2017-04-18: qty 120

## 2017-04-18 NOTE — Progress Notes (Signed)
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 Specimen integrity: Intact. Macroscopic tumor perforation: Not identified. Invasive tumor: Maximum size: 5.5 cm. Histologic type(s): Invasive adenocarcinoma. Histologic grade and differentiation: G1: well differentiated/low grade  Pathologic Staging: pT4b, pN1b, pM1b . 2.Patient was started on FOLFOX in September of 2016,after 2 cycles of chemotherapy patient developed neuropathy. Patient had a previous neuropathy as a baseline which increased so was switched over to FOLFIRI I IN December of 2016 As patient has clear os wild-type we can proceed to add either a Avastin or cetuximab from the next chemotherapy.(November, 2016)  Avastin was added as abdominal wound has completely healed now Avastin on hold (December, 2016) because of hypertension Chemotherapy was discontinued after protocol 8 cycle because of significant side effect (last chemotherapy was on October 28, 2015).  # DEC 2017- small bowel obstruction s/p resection [Dr.Hoxworth]- positive for recurrence. 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 ? 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  male  with above history of recurrent metastatic colon cancer to the liver/omentum on palliative chemo- Currently on 5-FU and Avastin is here for follow-up.   Patient denies any significant diarrhea. Denies any skin rash. Denies any nosebleeds. In general fatigue also improving. Denies any significant nausea or vomiting. Patient's chronic pain is improved /stable on  oxycodone and fentanyl patches.   REVIEW OF SYSTEMS:  A complete 10 point review of system is done which is negative except mentioned above/history of present illness.   PAST MEDICAL HISTORY :  Past Medical History:  Diagnosis Date  . Abdominal pain    mid abdomen  . Adiposity 03/21/2015  . Arthritis   . 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 Colon cancer ([place])   . 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 Extreme obesity 12/21/2012  . GERD (gastroesophageal reflux disease)   . History of kidney stones   . History of transfusion   . Hypertension    has been off BP med x 6 months  . Iron deficiency anemia due to chronic blood loss 05/08/2015  . LBP (low back pain) 05/28/2014  . Neuropathy   . PONV (postoperative nausea and vomiting)   . Sarcoid 10/2012  . Skin cancer    "it flairs up with sarcoidosis"  PAST SURGICAL HISTORY :   Past Surgical History:  Procedure Laterality Date  . CARDIAC CATHETERIZATION    . FLEXIBLE SIGMOIDOSCOPY N/A 05/07/2015   Procedure: FLEXIBLE SIGMOIDOSCOPY;   Surgeon: Laurence Spates, MD;  Location: [facility];  Service: Endoscopy;  Laterality: N/A;  . LAPAROSCOPIC PARTIAL COLECTOMY N/A 05/07/2015   Procedure: LAPAROSCOPIC ASSITED PARTIAL COLECTOMY WITH COLOSTOMY, SMALL BOWEL RESECTION, EXCISION OF PERITONEAL NODULE;  Surgeon: [name], MD;  Location: [facility];  Service: General;  Laterality: N/A;  . LAPAROTOMY N/A 08/10/2016   Procedure: EXPLORATORY LAPAROTOMY small bowel resection abdominal wall resection partial omentectomy;  Surgeon: Excell Seltzer, MD;  Location: [facility];  Service: General;  Laterality: N/A;  . PORTACATH PLACEMENT Right 05/27/2015   Procedure: INSERTION PORT-A-CATH;  Surgeon: [name], MD;  Location: [facility];  Service: General;  Laterality: Right;    FAMILY HISTORY :   Family History  Problem Relation Age of Onset  . Arthritis Mother   . Heart disease Mother   . Stroke Father   . Prostate cancer Neg Hx   . Bladder Cancer Neg Hx     SOCIAL HISTORY:   Social History  Substance Use Topics  . Smoking status: Passive Smoke Exposure - Never Smoker  . Smokeless tobacco: Never Used  . Alcohol use No    ALLERGIES:  is allergic to versed [midazolam]; gabapentin; paba derivatives; and benzyl benzoate.  MEDICATIONS:  Current Outpatient Prescriptions  Medication Sig Dispense Refill  . diphenoxylate-atropine (LOMOTIL) 2.5-0.025 MG tablet Take 1 tablet by mouth 4 (four) times daily as needed for diarrhea or loose stools. 30 tablet 3  . lidocaine-prilocaine (EMLA) cream Apply 1 application topically as needed. Apply small amount to port site at least 1 hour prior to it being accessed, cover with plastic wrap 30 g 1  . loperamide (IMODIUM A-D) 2 MG tablet Take 2 mg by mouth 4 (four) times daily as needed for diarrhea or loose stools.    Marland Kitchen LORazepam (ATIVAN) 0.5 MG tablet Take every 8 hours as needed for nausea/anxiety. 90 tablet 3  . promethazine (PHENERGAN) 25 MG tablet Take 1 tablet (25 mg total) by mouth every 6  (six) hours as needed. 60 tablet 6  . sucralfate (CARAFATE) 1 g tablet Take 1 tablet (1 g total) by mouth 4 (four) times daily. 120 tablet 1  . zolpidem (AMBIEN) 10 MG tablet Take 10-15 mg by mouth at bedtime as needed for sleep.    . fentaNYL (DURAGESIC - DOSED MCG/HR) 75 MCG/HR Place 1 patch (75 mcg total) onto the skin every 3 (three) days. 10 patch 0  . Oxycodone HCl 10 MG TABS Take 1 tablet (10 mg total) by mouth every 6 (six) hours as needed (severe pain). 60 tablet 0  . prochlorperazine (COMPAZINE) 10 MG tablet Take 1 tablet (10 mg total) by mouth every 6 (six) hours as needed for nausea or vomiting. 60 tablet 3   No current facility-administered medications for this visit.    Facility-Administered Medications Ordered in Other Visits  Medication Dose Route Frequency Provider Last Rate Last Dose  . fluorouracil (ADRUCIL) 6,000 mg in sodium chloride 0.9 % 130 mL chemo infusion  2,400 mg/m2 (Treatment Plan Recorded) Intravenous 1 day or 1 dose Charlaine Dalton R, MD   6,000 mg at 04/18/17 1130  . heparin lock flush 100 unit/mL  500 Units Intravenous Once Charlaine Dalton R, MD      . sodium chloride flush (NS) 0.9 % injection 10 mL  10 mL Intravenous  PRN Forest Gleason, MD   10 mL at 02/11/16 1326  . sodium chloride flush (NS) 0.9 % injection 10 mL  10 mL Intravenous PRN Cammie Sickle, MD   10 mL at 04/18/17 0847    PHYSICAL EXAMINATION: ECOG PERFORMANCE STATUS: 0 - Asymptomatic  BP 117/87 (Patient Position: Sitting)   Pulse (!) 103   Temp 97.6 F (36.4 C) (Tympanic)   Resp 20   Ht 5' 11"  (1.803 m)   Wt 287 lb 9.6 oz (130.5 kg)   BMI 40.11 kg/m   Filed Weights   04/18/17 0934  Weight: 287 lb 9.6 oz (130.5 kg)    GENERAL: Obese Caucasian male patient. Alert, no distress and comfortable.  Obese. Accompanied by is wife. Walking by himself.  EYES: no pallor or icterus OROPHARYNX: no thrush or ulceration; good dentition  NECK: supple, no masses felt LYMPH:  no  palpable lymphadenopathy in the cervical, axillary or inguinal regions LUNGS: clear to auscultation and  No wheeze or crackles HEART/CVS: regular rate & rhythm and no murmurs; No lower extremity edema ABDOMEN:abdomen soft, non-tender and normal bowel sounds; Positive for colostomy. Incisions were healing. Musculoskeletal:no cyanosis of digits and no clubbing  PSYCH: alert & oriented x 3 with fluent speech.  NEURO: no focal motor/sensory deficits SKIN:  no rashes or significant lesions  LABORATORY DATA:  I have reviewed the data as listed    Component Value Date/Time   NA 141 04/18/2017 0839   NA 138 01/02/2015 1555   K 3.4 (L) 04/18/2017 0839   K 3.7 01/02/2015 1555   CL 107 04/18/2017 0839   CL 109 01/02/2015 1555   CO2 26 04/18/2017 0839   CO2 22 01/02/2015 1555   GLUCOSE 105 (H) 04/18/2017 0839   GLUCOSE 110 (H) 01/02/2015 1555   BUN 10 04/18/2017 0839   BUN 9 01/02/2015 1555   CREATININE 0.99 04/18/2017 0839   CREATININE 1.04 01/02/2015 1555   CALCIUM 8.7 (L) 04/18/2017 0839   CALCIUM 8.8 (L) 01/02/2015 1555   PROT 7.0 04/18/2017 0839   PROT 8.2 (H) 01/02/2015 1555   ALBUMIN 3.6 04/18/2017 0839   ALBUMIN 4.0 01/02/2015 1555   AST 33 04/18/2017 0839   AST 39 01/02/2015 1555   ALT 25 04/18/2017 0839   ALT 55 01/02/2015 1555   ALKPHOS 92 04/18/2017 0839   ALKPHOS 125 01/02/2015 1555   BILITOT 0.7 04/18/2017 0839   BILITOT 0.5 01/02/2015 1555   GFRNONAA >60 04/18/2017 0839   GFRNONAA >60 01/02/2015 1555   GFRAA >60 04/18/2017 0839   GFRAA >60 01/02/2015 1555    No results found for: SPEP, UPEP  Lab Results  Component Value Date   WBC 7.7 04/18/2017   NEUTROABS 4.9 04/18/2017   HGB 12.8 (L) 04/18/2017   HCT 37.9 (L) 04/18/2017   MCV 88.5 04/18/2017   PLT 209 04/18/2017      Chemistry      Component Value Date/Time   NA 141 04/18/2017 0839   NA 138 01/02/2015 1555   K 3.4 (L) 04/18/2017 0839   K 3.7 01/02/2015 1555   CL 107 04/18/2017 0839   CL 109  01/02/2015 1555   CO2 26 04/18/2017 0839   CO2 22 01/02/2015 1555   BUN 10 04/18/2017 0839   BUN 9 01/02/2015 1555   CREATININE 0.99 04/18/2017 0839   CREATININE 1.04 01/02/2015 1555      Component Value Date/Time   CALCIUM 8.7 (L) 04/18/2017 0839   CALCIUM 8.8 (L) 01/02/2015  9163   WGYKZLD 35 04/18/2017 0839   ALKPHOS 125 01/02/2015 1555   AST 33 04/18/2017 0839   AST 39 01/02/2015 1555   ALT 25 04/18/2017 0839   ALT 55 01/02/2015 1555   BILITOT 0.7 04/18/2017 0839   BILITOT 0.5 01/02/2015 1555     IMPRESSION: 1. Unfortunately there is new metastatic disease to the right lung, extensive metastatic disease to the liver, and metastatic disease to the omentum, peritoneal margin, and mesentery. 2. Other imaging findings of potential clinical significance: Coronary atherosclerosis. 3. Bilateral nonobstructive nephrolithiasis.   Electronically Signed   By: Van Clines M.D.   On: 10/14/2016 13:33  RADIOGRAPHIC STUDIES: I have personally reviewed the radiological images as listed and agreed with the findings in the report. No results found.   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 Currently on pallaitive chemotherapy on 5-FU-Avastin maintenance. Monitor CEA closely- most recently 38.   # continue 5FU-Avastin every 2 weeks; patient's tolerance to chemotherapy has improved since been taken off irinotecan. Labs today reviewed;  acceptable for treatment today.   # Blood press elevated; improved 140s/80-90s. Re-start avastin today. ecommend keeping a log of blood pressures at home. To bring it next visit.  # pain management/chronic pain-STABLE; Continue  fentanyl 75 g every 72 hours and  oxycodone every 6-8 hours.  Refilled again.   =# Hypokalemia- today 3.4; not too keen on supplementation.  #   follow up 2 weeks/4 weeks/ chemo/labs/UA- CEA; 5FU-Avastin.   Orders Placed This Encounter  Procedures  . CBC with Differential    Standing Status:   Standing    Number of Occurrences:   20    Standing Expiration Date:   04/18/2018  . Comprehensive metabolic panel    Standing Status:   Standing    Number of Occurrences:   20    Standing Expiration Date:   04/18/2018  . Urinalysis, Complete w Microscopic    Standing Status:   Standing    Number of Occurrences:   20    Standing Expiration Date:   04/18/2018  . CEA    Standing Status:   Standing    Number of Occurrences:   20    Standing Expiration Date:   04/18/2018       Cammie Sickle, MD 04/18/2017 2:16 PM

## 2017-04-18 NOTE — Assessment & Plan Note (Addendum)
#    Colon cancer with metastases- peritoneal/omental metastases-status post small bowel resection; liver metastases on FOLFIRI s/p # 6 cycles- May 14th CT- improved omental; lung nodules; STABLE liver lesions. Currently on pallaitive chemotherapy on 5-FU-Avastin maintenance. Monitor CEA closely- most recently 85.   # continue 5FU-Avastin every 2 weeks; patient's tolerance to chemotherapy has improved since been taken off irinotecan. Labs today reviewed;  acceptable for treatment today.   # Blood press elevated; improved 140s/80-90s. Re-start avastin today. ecommend keeping a log of blood pressures at home. To bring it next visit.  # pain management/chronic pain-STABLE; Continue  fentanyl 75 g every 72 hours and  oxycodone every 6-8 hours. Refilled again.   =# Hypokalemia- today 3.4; not too keen on supplementation.  #   follow up 2 weeks/4 weeks/ chemo/labs/UA- CEA; 5FU-Avastin.

## 2017-04-18 NOTE — Progress Notes (Signed)
Patient brought home BP measurements to the office today due to h/o of elevated bp.  Pt states that he feels "the best in a while." c/o generalized fatigue. Requesting RF on oxycodone and fentanyl patches.

## 2017-04-19 LAB — CEA: CEA1: 48.5 ng/mL — AB (ref 0.0–4.7)

## 2017-04-20 ENCOUNTER — Inpatient Hospital Stay: Payer: BLUE CROSS/BLUE SHIELD

## 2017-04-20 VITALS — BP 132/91 | HR 81 | Temp 97.8°F | Resp 20

## 2017-04-20 DIAGNOSIS — C186 Malignant neoplasm of descending colon: Secondary | ICD-10-CM | POA: Diagnosis not present

## 2017-04-20 DIAGNOSIS — C772 Secondary and unspecified malignant neoplasm of intra-abdominal lymph nodes: Principal | ICD-10-CM

## 2017-04-20 MED ORDER — SODIUM CHLORIDE 0.9% FLUSH
10.0000 mL | INTRAVENOUS | Status: DC | PRN
  Administered 2017-04-20: 10 mL
  Filled 2017-04-20: qty 10

## 2017-04-20 MED ORDER — HEPARIN SOD (PORK) LOCK FLUSH 100 UNIT/ML IV SOLN
500.0000 [IU] | Freq: Once | INTRAVENOUS | Status: AC | PRN
  Administered 2017-04-20: 500 [IU]
  Filled 2017-04-20: qty 5

## 2017-04-25 ENCOUNTER — Other Ambulatory Visit: Payer: Self-pay | Admitting: *Deleted

## 2017-04-25 MED ORDER — LORAZEPAM 0.5 MG PO TABS: ORAL_TABLET | ORAL | 3 refills | Status: DC

## 2017-05-02 ENCOUNTER — Inpatient Hospital Stay (HOSPITAL_BASED_OUTPATIENT_CLINIC_OR_DEPARTMENT_OTHER): Payer: BLUE CROSS/BLUE SHIELD | Admitting: Internal Medicine

## 2017-05-02 ENCOUNTER — Inpatient Hospital Stay: Payer: BLUE CROSS/BLUE SHIELD

## 2017-05-02 VITALS — BP 155/96 | HR 90 | Temp 97.4°F | Resp 18 | Wt 289.2 lb

## 2017-05-02 DIAGNOSIS — C772 Secondary and unspecified malignant neoplasm of intra-abdominal lymph nodes: Secondary | ICD-10-CM

## 2017-05-02 DIAGNOSIS — C786 Secondary malignant neoplasm of retroperitoneum and peritoneum: Secondary | ICD-10-CM

## 2017-05-02 DIAGNOSIS — C186 Malignant neoplasm of descending colon: Secondary | ICD-10-CM

## 2017-05-02 DIAGNOSIS — R198 Other specified symptoms and signs involving the digestive system and abdomen: Secondary | ICD-10-CM | POA: Diagnosis not present

## 2017-05-02 DIAGNOSIS — G893 Neoplasm related pain (acute) (chronic): Secondary | ICD-10-CM | POA: Diagnosis not present

## 2017-05-02 DIAGNOSIS — E876 Hypokalemia: Secondary | ICD-10-CM | POA: Diagnosis not present

## 2017-05-02 DIAGNOSIS — C787 Secondary malignant neoplasm of liver and intrahepatic bile duct: Secondary | ICD-10-CM

## 2017-05-02 DIAGNOSIS — Z79899 Other long term (current) drug therapy: Secondary | ICD-10-CM

## 2017-05-02 LAB — COMPREHENSIVE METABOLIC PANEL
ALK PHOS: 79 U/L (ref 38–126)
ALT: 22 U/L (ref 17–63)
ANION GAP: 9 (ref 5–15)
AST: 33 U/L (ref 15–41)
Albumin: 3.5 g/dL (ref 3.5–5.0)
BUN: 10 mg/dL (ref 6–20)
CALCIUM: 8.8 mg/dL — AB (ref 8.9–10.3)
CO2: 25 mmol/L (ref 22–32)
CREATININE: 0.99 mg/dL (ref 0.61–1.24)
Chloride: 107 mmol/L (ref 101–111)
Glucose, Bld: 96 mg/dL (ref 65–99)
Potassium: 3.4 mmol/L — ABNORMAL LOW (ref 3.5–5.1)
SODIUM: 141 mmol/L (ref 135–145)
TOTAL PROTEIN: 7 g/dL (ref 6.5–8.1)
Total Bilirubin: 0.8 mg/dL (ref 0.3–1.2)

## 2017-05-02 LAB — URINALYSIS, COMPLETE (UACMP) WITH MICROSCOPIC
BACTERIA UA: NONE SEEN
Bilirubin Urine: NEGATIVE
GLUCOSE, UA: NEGATIVE mg/dL
HGB URINE DIPSTICK: NEGATIVE
KETONES UR: NEGATIVE mg/dL
LEUKOCYTES UA: NEGATIVE
NITRITE: NEGATIVE
PROTEIN: NEGATIVE mg/dL
Specific Gravity, Urine: 1.019 (ref 1.005–1.030)
pH: 6 (ref 5.0–8.0)

## 2017-05-02 LAB — CBC WITH DIFFERENTIAL/PLATELET
Basophils Absolute: 0 10*3/uL (ref 0–0.1)
Basophils Relative: 1 %
EOS ABS: 0.1 10*3/uL (ref 0–0.7)
EOS PCT: 2 %
HCT: 36.8 % — ABNORMAL LOW (ref 40.0–52.0)
HEMOGLOBIN: 12.6 g/dL — AB (ref 13.0–18.0)
LYMPHS ABS: 1.2 10*3/uL (ref 1.0–3.6)
LYMPHS PCT: 18 %
MCH: 30.3 pg (ref 26.0–34.0)
MCHC: 34.3 g/dL (ref 32.0–36.0)
MCV: 88.3 fL (ref 80.0–100.0)
MONOS PCT: 10 %
Monocytes Absolute: 0.7 10*3/uL (ref 0.2–1.0)
Neutro Abs: 4.6 10*3/uL (ref 1.4–6.5)
Neutrophils Relative %: 69 %
PLATELETS: 170 10*3/uL (ref 150–440)
RBC: 4.16 MIL/uL — ABNORMAL LOW (ref 4.40–5.90)
RDW: 16.3 % — ABNORMAL HIGH (ref 11.5–14.5)
WBC: 6.6 10*3/uL (ref 3.8–10.6)

## 2017-05-02 MED ORDER — LEUCOVORIN CALCIUM INJECTION 100 MG
20.0000 mg/m2 | Freq: Once | INTRAMUSCULAR | Status: AC
  Administered 2017-05-02: 50 mg via INTRAVENOUS
  Filled 2017-05-02: qty 2.5

## 2017-05-02 MED ORDER — DEXAMETHASONE SODIUM PHOSPHATE 10 MG/ML IJ SOLN
10.0000 mg | Freq: Once | INTRAMUSCULAR | Status: AC
  Administered 2017-05-02: 10 mg via INTRAVENOUS
  Filled 2017-05-02: qty 1

## 2017-05-02 MED ORDER — SODIUM CHLORIDE 0.9 % IV SOLN
Freq: Once | INTRAVENOUS | Status: AC
  Administered 2017-05-02: 11:00:00 via INTRAVENOUS
  Filled 2017-05-02: qty 1000

## 2017-05-02 MED ORDER — SODIUM CHLORIDE 0.9 % IV SOLN
600.0000 mg | Freq: Once | INTRAVENOUS | Status: AC
  Administered 2017-05-02: 600 mg via INTRAVENOUS
  Filled 2017-05-02: qty 16

## 2017-05-02 MED ORDER — PALONOSETRON HCL INJECTION 0.25 MG/5ML
0.2500 mg | Freq: Once | INTRAVENOUS | Status: AC
  Administered 2017-05-02: 0.25 mg via INTRAVENOUS
  Filled 2017-05-02: qty 5

## 2017-05-02 MED ORDER — SODIUM CHLORIDE 0.9% FLUSH
10.0000 mL | INTRAVENOUS | Status: DC | PRN
  Administered 2017-05-02: 10 mL
  Filled 2017-05-02: qty 10

## 2017-05-02 MED ORDER — SODIUM CHLORIDE 0.9 % IV SOLN
2400.0000 mg/m2 | INTRAVENOUS | Status: DC
  Administered 2017-05-02: 6000 mg via INTRAVENOUS
  Filled 2017-05-02: qty 120

## 2017-05-02 NOTE — Progress Notes (Signed)
Patient has abdominal pain that is 4/10 today on pain scale that will decrease to 1-2 after taking Oxycodone.  BP today is 155/96 and does bring in home bp readings with this morning result of 152/99.

## 2017-05-02 NOTE — Assessment & Plan Note (Addendum)
#    Colon cancer with metastases- peritoneal/omental metastases-status post small bowel resection; liver metastases on FOLFIRI s/p # 6 cycles- May 14th CT- improved omental; lung nodules; STABLE liver lesions. Currently on pallaitive chemotherapy on 5-FU-Avastin maintenance. Monitor CEA closely- most recently 19.   # continue 5FU-Avastin every 2 weeks; patient's tolerance to chemotherapy has improved since been taken off irinotecan. Labs today reviewed;  acceptable for treatment today.   # Blood press elevated; at home 140s/80-90s.recommend starting "blood pressure medications"- call us with name of medication.  # pain management/chronic pain-STABLE; Continue  fentanyl 75 g every 72 hours and  oxycodone every 6-8 hours. Refilled again.   # Fatigue- multifactorial recommend increased physical activity.  # Hypokalemia- today 3.4; not too keen on supplementation.  #   follow up 2 weeks- CT scan prior/4 weeks/ chemo/labs/UA- CEA; 5FU-Avastin.

## 2017-05-02 NOTE — Progress Notes (Signed)
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 Specimen integrity: Intact. Macroscopic tumor perforation: Not identified. Invasive tumor: Maximum size: 5.5 cm. Histologic type(s): Invasive adenocarcinoma. Histologic grade and differentiation: G1: well differentiated/low grade  Pathologic Staging: pT4b, pN1b, pM1b . 2.Patient was started on FOLFOX in September of 2016,after 2 cycles of chemotherapy patient developed neuropathy. Patient had a previous neuropathy as a baseline which increased so was switched over to FOLFIRI I IN December of 2016 As patient has clear os wild-type we can proceed to add either a Avastin or cetuximab from the next chemotherapy.(November, 2016)  Avastin was added as abdominal wound has completely healed now Avastin on hold (December, 2016) because of hypertension Chemotherapy was discontinued after protocol 8 cycle because of significant side effect (last chemotherapy was on October 28, 2015).  # DEC 2017- small bowel obstruction s/p resection [Dr.Hoxworth]- positive for recurrence. 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 ? 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  male  with above history of recurrent metastatic colon cancer to the liver/omentum on palliative chemo- Currently on 5-FU and Avastin is here for follow-up.   Patient complains of mild-to-moderate fatigue. 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  Patient denies any significant diarrhea. Denies any skin rash. Denies any nosebleeds. Denies any significant nausea or vomiting.  His wife brings her log of his blood pressures at home.  REVIEW OF SYSTEMS:  A complete 10 point review of system is done which is negative except mentioned above/history of present illness.   PAST MEDICAL HISTORY :  Past Medical History:  Diagnosis Date  . Abdominal pain    mid abdomen  . Adiposity 03/21/2015  . Arthritis   . 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 Colon cancer ([place])   . 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 Extreme obesity 12/21/2012  . GERD (gastroesophageal reflux disease)   . History of kidney stones   . History of transfusion   . Hypertension    has been off BP med x 6 months  . Iron deficiency anemia due to chronic blood loss 05/08/2015  . LBP (low back pain) 05/28/2014  . Neuropathy   . PONV (postoperative nausea and  vomiting)   . Sarcoid 10/2012  . Skin cancer    "it flairs up with sarcoidosis"    PAST SURGICAL HISTORY :   Past Surgical History:   Procedure Laterality Date  . CARDIAC CATHETERIZATION    . FLEXIBLE SIGMOIDOSCOPY N/A 05/07/2015   Procedure: FLEXIBLE SIGMOIDOSCOPY;  Surgeon: Laurence Spates, MD;  Location: [facility];  Service: Endoscopy;  Laterality: N/A;  . LAPAROSCOPIC PARTIAL COLECTOMY N/A 05/07/2015   Procedure: LAPAROSCOPIC ASSITED PARTIAL COLECTOMY WITH COLOSTOMY, SMALL BOWEL RESECTION, EXCISION OF PERITONEAL NODULE;  Surgeon: [name], MD;  Location: [facility];  Service: General;  Laterality: N/A;  . LAPAROTOMY N/A 08/10/2016   Procedure: EXPLORATORY LAPAROTOMY small bowel resection abdominal wall resection partial omentectomy;  Surgeon: Excell Seltzer, MD;  Location: [facility];  Service: General;  Laterality: N/A;  . PORTACATH PLACEMENT Right 05/27/2015   Procedure: INSERTION PORT-A-CATH;  Surgeon: [name], MD;  Location: [facility];  Service: General;  Laterality: Right;    FAMILY HISTORY :   Family History  Problem Relation Age of Onset  . Arthritis Mother   . Heart disease Mother   . Stroke Father   . Prostate cancer Neg Hx   . Bladder Cancer Neg Hx     SOCIAL HISTORY:   Social History  Substance Use Topics  . Smoking status: Passive Smoke Exposure - Never Smoker  . Smokeless tobacco: Never Used  . Alcohol use No    ALLERGIES:  is allergic to versed [midazolam]; gabapentin; paba derivatives; and benzyl benzoate.  MEDICATIONS:  Current Outpatient Prescriptions  Medication Sig Dispense Refill  . diphenoxylate-atropine (LOMOTIL) 2.5-0.025 MG tablet Take 1 tablet by mouth 4 (four) times daily as needed for diarrhea or loose stools. 30 tablet 3  . fentaNYL (DURAGESIC - DOSED MCG/HR) 75 MCG/HR Place 1 patch (75 mcg total) onto the skin every 3 (three) days. 10 patch 0  . lidocaine-prilocaine (EMLA) cream Apply 1 application topically as needed. Apply small amount to port site at least 1 hour prior to it being accessed, cover with plastic wrap 30 g 1  . loperamide (IMODIUM A-D) 2 MG tablet Take 2  mg by mouth 4 (four) times daily as needed for diarrhea or loose stools.    Marland Kitchen LORazepam (ATIVAN) 0.5 MG tablet Take every 8 hours as needed for nausea/anxiety. 90 tablet 3  . Oxycodone HCl 10 MG TABS Take 1 tablet (10 mg total) by mouth every 6 (six) hours as needed (severe pain). 60 tablet 0  . prochlorperazine (COMPAZINE) 10 MG tablet Take 1 tablet (10 mg total) by mouth every 6 (six) hours as needed for nausea or vomiting. 60 tablet 3  . promethazine (PHENERGAN) 25 MG tablet Take 1 tablet (25 mg total) by mouth every 6 (six) hours as needed. 60 tablet 6  . sucralfate (CARAFATE) 1 g tablet Take 1 tablet (1 g total) by mouth 4 (four) times daily. 120 tablet 1  . zolpidem (AMBIEN) 10 MG tablet Take 10-15 mg by mouth at bedtime as needed for sleep.     No current facility-administered medications for this visit.    Facility-Administered Medications Ordered in Other Visits  Medication Dose Route Frequency Provider Last Rate Last Dose  . sodium chloride flush (NS) 0.9 % injection 10 mL  10 mL Intravenous PRN Choksi, Delorise Shiner, MD   10 mL at 02/11/16 1326    PHYSICAL EXAMINATION: ECOG PERFORMANCE STATUS: 0 - Asymptomatic  BP (!) 155/96   Pulse 90   Temp (!) 97.4 F (36.3  C) (Tympanic)   Resp 18   Wt 289 lb 3.2 oz (131.2 kg)   BMI 40.34 kg/m   Filed Weights   05/02/17 1011  Weight: 289 lb 3.2 oz (131.2 kg)    GENERAL: Obese Caucasian male patient. Alert, no distress and comfortable.  Obese. Accompanied by is wife. Walking by himself.  EYES: no pallor or icterus OROPHARYNX: no thrush or ulceration; good dentition  NECK: supple, no masses felt LYMPH:  no palpable lymphadenopathy in the cervical, axillary or inguinal regions LUNGS: clear to auscultation and  No wheeze or crackles HEART/CVS: regular rate & rhythm and no murmurs; No lower extremity edema ABDOMEN:abdomen soft, non-tender and normal bowel sounds; Positive for colostomy. Incisions were healing. Musculoskeletal:no cyanosis of  digits and no clubbing  PSYCH: alert & oriented x 3 with fluent speech.  NEURO: no focal motor/sensory deficits SKIN:  no rashes or significant lesions  LABORATORY DATA:  I have reviewed the data as listed    Component Value Date/Time   NA 141 05/02/2017 0940   NA 138 01/02/2015 1555   K 3.4 (L) 05/02/2017 0940   K 3.7 01/02/2015 1555   CL 107 05/02/2017 0940   CL 109 01/02/2015 1555   CO2 25 05/02/2017 0940   CO2 22 01/02/2015 1555   GLUCOSE 96 05/02/2017 0940   GLUCOSE 110 (H) 01/02/2015 1555   BUN 10 05/02/2017 0940   BUN 9 01/02/2015 1555   CREATININE 0.99 05/02/2017 0940   CREATININE 1.04 01/02/2015 1555   CALCIUM 8.8 (L) 05/02/2017 0940   CALCIUM 8.8 (L) 01/02/2015 1555   PROT 7.0 05/02/2017 0940   PROT 8.2 (H) 01/02/2015 1555   ALBUMIN 3.5 05/02/2017 0940   ALBUMIN 4.0 01/02/2015 1555   AST 33 05/02/2017 0940   AST 39 01/02/2015 1555   ALT 22 05/02/2017 0940   ALT 55 01/02/2015 1555   ALKPHOS 79 05/02/2017 0940   ALKPHOS 125 01/02/2015 1555   BILITOT 0.8 05/02/2017 0940   BILITOT 0.5 01/02/2015 1555   GFRNONAA >60 05/02/2017 0940   GFRNONAA >60 01/02/2015 1555   GFRAA >60 05/02/2017 0940   GFRAA >60 01/02/2015 1555    No results found for: SPEP, UPEP  Lab Results  Component Value Date   WBC 6.6 05/02/2017   NEUTROABS 4.6 05/02/2017   HGB 12.6 (L) 05/02/2017   HCT 36.8 (L) 05/02/2017   MCV 88.3 05/02/2017   PLT 170 05/02/2017      Chemistry      Component Value Date/Time   NA 141 05/02/2017 0940   NA 138 01/02/2015 1555   K 3.4 (L) 05/02/2017 0940   K 3.7 01/02/2015 1555   CL 107 05/02/2017 0940   CL 109 01/02/2015 1555   CO2 25 05/02/2017 0940   CO2 22 01/02/2015 1555   BUN 10 05/02/2017 0940   BUN 9 01/02/2015 1555   CREATININE 0.99 05/02/2017 0940   CREATININE 1.04 01/02/2015 1555      Component Value Date/Time   CALCIUM 8.8 (L) 05/02/2017 0940   CALCIUM 8.8 (L) 01/02/2015 1555   ALKPHOS 79 05/02/2017 0940   ALKPHOS 125 01/02/2015  1555   AST 33 05/02/2017 0940   AST 39 01/02/2015 1555   ALT 22 05/02/2017 0940   ALT 55 01/02/2015 1555   BILITOT 0.8 05/02/2017 0940   BILITOT 0.5 01/02/2015 1555     IMPRESSION: 1. Unfortunately there is new metastatic disease to the right lung, extensive metastatic disease to the liver, and metastatic disease  to the omentum, peritoneal margin, and mesentery. 2. Other imaging findings of potential clinical significance: Coronary atherosclerosis. 3. Bilateral nonobstructive nephrolithiasis.   Electronically Signed   By: Van Clines M.D.   On: 10/14/2016 13:33  RADIOGRAPHIC STUDIES: I have personally reviewed the radiological images as listed and agreed with the findings in the report. No results found.   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 Currently on pallaitive chemotherapy on 5-FU-Avastin maintenance. Monitor CEA closely- most recently 79.   # continue 5FU-Avastin every 2 weeks; patient's tolerance to chemotherapy has improved since been taken off irinotecan. Labs today reviewed;  acceptable for treatment today.   # Blood press elevated; at home 140s/80-90s.recommend starting "blood pressure medications"- call us with name of medication.  # pain management/chronic pain-STABLE; Continue  fentanyl 75 g every 72 hours and  oxycodone every 6-8 hours. Refilled again.   # Fatigue- multifactorial recommend increased physical activity.  # Hypokalemia- today 3.4; not too keen on supplementation.  #   follow up 2 weeks- CT scan prior/4 weeks/ chemo/labs/UA- CEA; 5FU-Avastin.   Orders Placed This Encounter  Procedures  . CT ABDOMEN PELVIS W CONTRAST    Standing Status:   Future    Standing Expiration Date:    05/02/2018    Order Specific Question:   If indicated for the ordered procedure, I authorize the administration of contrast media per Radiology protocol    Answer:   Yes    Order Specific Question:   Preferred imaging location?    Answer:   [facility]    Order Specific Question:   Radiology Contrast Protocol - do NOT remove file path    Answer:   [email or web address]    Order Specific Question:   Reason for Exam additional comments    Answer:   colon cancer re-staging.  . CT CHEST W CONTRAST    Standing Status:   Future    Standing Expiration Date:   05/02/2018    Order Specific Question:   If indicated for the ordered procedure, I authorize the administration of contrast media per Radiology protocol    Answer:   Yes    Order Specific Question:   Preferred imaging location?    Answer:   [facility]    Order Specific Question:   Radiology Contrast Protocol - do NOT remove file path    Answer:   [email or web address]       Cammie Sickle, MD 05/02/2017 3:59 PM

## 2017-05-03 LAB — CEA: CEA: 40.9 ng/mL — ABNORMAL HIGH (ref 0.0–4.7)

## 2017-05-04 ENCOUNTER — Inpatient Hospital Stay: Payer: BLUE CROSS/BLUE SHIELD

## 2017-05-04 VITALS — BP 153/101 | HR 95 | Temp 98.1°F | Resp 18

## 2017-05-04 DIAGNOSIS — C186 Malignant neoplasm of descending colon: Secondary | ICD-10-CM | POA: Diagnosis not present

## 2017-05-04 DIAGNOSIS — C772 Secondary and unspecified malignant neoplasm of intra-abdominal lymph nodes: Principal | ICD-10-CM

## 2017-05-04 MED ORDER — SODIUM CHLORIDE 0.9% FLUSH
10.0000 mL | INTRAVENOUS | Status: DC | PRN
  Administered 2017-05-04: 10 mL
  Filled 2017-05-04: qty 10

## 2017-05-04 MED ORDER — HEPARIN SOD (PORK) LOCK FLUSH 100 UNIT/ML IV SOLN
500.0000 [IU] | Freq: Once | INTRAVENOUS | Status: AC | PRN
Start: 2017-05-04 — End: 2017-05-04
  Administered 2017-05-04: 500 [IU]
  Filled 2017-05-04: qty 5

## 2017-05-13 ENCOUNTER — Telehealth: Payer: Self-pay | Admitting: Internal Medicine

## 2017-05-13 ENCOUNTER — Telehealth: Payer: Self-pay | Admitting: *Deleted

## 2017-05-13 ENCOUNTER — Ambulatory Visit
Admission: RE | Admit: 2017-05-13 | Discharge: 2017-05-13 | Disposition: A | Discharge status: Home / Self Care | Payer: BLUE CROSS/BLUE SHIELD | Source: Ambulatory Visit | Attending: Internal Medicine | Admitting: Internal Medicine

## 2017-05-13 DIAGNOSIS — N200 Calculus of kidney: Secondary | ICD-10-CM | POA: Diagnosis not present

## 2017-05-13 DIAGNOSIS — C186 Malignant neoplasm of descending colon: Secondary | ICD-10-CM

## 2017-05-13 DIAGNOSIS — C772 Secondary and unspecified malignant neoplasm of intra-abdominal lymph nodes: Secondary | ICD-10-CM | POA: Insufficient documentation

## 2017-05-13 DIAGNOSIS — K6389 Other specified diseases of intestine: Secondary | ICD-10-CM | POA: Insufficient documentation

## 2017-05-13 DIAGNOSIS — K760 Fatty (change of) liver, not elsewhere classified: Secondary | ICD-10-CM | POA: Diagnosis not present

## 2017-05-13 MED ORDER — IOPAMIDOL (ISOVUE-370) INJECTION 76%
100.0000 mL | Freq: Once | INTRAVENOUS | Status: AC | PRN
  Administered 2017-05-13: 100 mL via INTRAVENOUS

## 2017-05-13 NOTE — Telephone Encounter (Signed)
Patient has returned phone call and would like to speak to Dr. B.  Dr. B notified and is to return phone call.

## 2017-05-13 NOTE — Telephone Encounter (Signed)
Spoke to pt re: results of CT scan; slight progression.    Discussed re: air in the small bowel/ pt fairly asymptomatic; Recommend going to the emergency room if he develops any abdominal symptoms. He agrees.

## 2017-05-13 NOTE — Telephone Encounter (Signed)
Call attempt x 3 to reach patient. Patient had abn imaging on CT scan (worrisome for potential bowel perforation). I personally attempted to reach patient to evaluate patient's symptoms and to evaluate pt for worsening abdominal pain.  No answer. I am not able to leave vm for patient's on home ph. Phone just rings. I personally left a vm on pt's cell phone # and pt's wife's phone number.  Per md- patient may need to go to ED for further work-up/evaluation if in worsening abd. pain.

## 2017-05-18 ENCOUNTER — Inpatient Hospital Stay: Payer: BLUE CROSS/BLUE SHIELD

## 2017-05-18 ENCOUNTER — Inpatient Hospital Stay: Payer: BLUE CROSS/BLUE SHIELD | Attending: Internal Medicine | Admitting: Internal Medicine

## 2017-05-18 VITALS — BP 132/90 | HR 100 | Temp 98.7°F | Resp 22 | Ht 71.0 in | Wt 276.0 lb

## 2017-05-18 DIAGNOSIS — Z9049 Acquired absence of other specified parts of digestive tract: Secondary | ICD-10-CM | POA: Insufficient documentation

## 2017-05-18 DIAGNOSIS — C772 Secondary and unspecified malignant neoplasm of intra-abdominal lymph nodes: Principal | ICD-10-CM

## 2017-05-18 DIAGNOSIS — D869 Sarcoidosis, unspecified: Secondary | ICD-10-CM | POA: Diagnosis not present

## 2017-05-18 DIAGNOSIS — E669 Obesity, unspecified: Secondary | ICD-10-CM | POA: Insufficient documentation

## 2017-05-18 DIAGNOSIS — Z87442 Personal history of urinary calculi: Secondary | ICD-10-CM | POA: Insufficient documentation

## 2017-05-18 DIAGNOSIS — C7800 Secondary malignant neoplasm of unspecified lung: Secondary | ICD-10-CM

## 2017-05-18 DIAGNOSIS — G893 Neoplasm related pain (acute) (chronic): Secondary | ICD-10-CM

## 2017-05-18 DIAGNOSIS — K760 Fatty (change of) liver, not elsewhere classified: Secondary | ICD-10-CM | POA: Diagnosis not present

## 2017-05-18 DIAGNOSIS — Z85828 Personal history of other malignant neoplasm of skin: Secondary | ICD-10-CM | POA: Diagnosis not present

## 2017-05-18 DIAGNOSIS — E876 Hypokalemia: Secondary | ICD-10-CM | POA: Diagnosis not present

## 2017-05-18 DIAGNOSIS — C786 Secondary malignant neoplasm of retroperitoneum and peritoneum: Secondary | ICD-10-CM | POA: Diagnosis not present

## 2017-05-18 DIAGNOSIS — R21 Rash and other nonspecific skin eruption: Secondary | ICD-10-CM | POA: Insufficient documentation

## 2017-05-18 DIAGNOSIS — C186 Malignant neoplasm of descending colon: Secondary | ICD-10-CM | POA: Diagnosis not present

## 2017-05-18 DIAGNOSIS — C787 Secondary malignant neoplasm of liver and intrahepatic bile duct: Secondary | ICD-10-CM | POA: Diagnosis not present

## 2017-05-18 DIAGNOSIS — Z79899 Other long term (current) drug therapy: Secondary | ICD-10-CM

## 2017-05-18 DIAGNOSIS — Z5112 Encounter for antineoplastic immunotherapy: Secondary | ICD-10-CM | POA: Diagnosis not present

## 2017-05-18 DIAGNOSIS — I10 Essential (primary) hypertension: Secondary | ICD-10-CM | POA: Insufficient documentation

## 2017-05-18 DIAGNOSIS — K219 Gastro-esophageal reflux disease without esophagitis: Secondary | ICD-10-CM | POA: Insufficient documentation

## 2017-05-18 DIAGNOSIS — K6389 Other specified diseases of intestine: Secondary | ICD-10-CM | POA: Diagnosis not present

## 2017-05-18 LAB — CBC WITH DIFFERENTIAL/PLATELET
BASOS ABS: 0 10*3/uL (ref 0–0.1)
BASOS PCT: 1 %
EOS ABS: 0.2 10*3/uL (ref 0–0.7)
EOS PCT: 3 %
HCT: 37.8 % — ABNORMAL LOW (ref 40.0–52.0)
Hemoglobin: 12.9 g/dL — ABNORMAL LOW (ref 13.0–18.0)
Lymphocytes Relative: 21 %
Lymphs Abs: 1.4 10*3/uL (ref 1.0–3.6)
MCH: 30.2 pg (ref 26.0–34.0)
MCHC: 34.1 g/dL (ref 32.0–36.0)
MCV: 88.7 fL (ref 80.0–100.0)
Monocytes Absolute: 0.6 10*3/uL (ref 0.2–1.0)
Monocytes Relative: 9 %
Neutro Abs: 4.4 10*3/uL (ref 1.4–6.5)
Neutrophils Relative %: 66 %
PLATELETS: 178 10*3/uL (ref 150–440)
RBC: 4.26 MIL/uL — AB (ref 4.40–5.90)
RDW: 16.3 % — ABNORMAL HIGH (ref 11.5–14.5)
WBC: 6.7 10*3/uL (ref 3.8–10.6)

## 2017-05-18 LAB — COMPREHENSIVE METABOLIC PANEL
ALT: 21 U/L (ref 17–63)
AST: 29 U/L (ref 15–41)
Albumin: 3.7 g/dL (ref 3.5–5.0)
Alkaline Phosphatase: 79 U/L (ref 38–126)
Anion gap: 9 (ref 5–15)
BUN: 8 mg/dL (ref 6–20)
CHLORIDE: 106 mmol/L (ref 101–111)
CO2: 27 mmol/L (ref 22–32)
CREATININE: 1.09 mg/dL (ref 0.61–1.24)
Calcium: 8.6 mg/dL — ABNORMAL LOW (ref 8.9–10.3)
GFR calc non Af Amer: >60 mL/min (ref >60)
Glucose, Bld: 99 mg/dL (ref 65–99)
Potassium: 3.4 mmol/L — ABNORMAL LOW (ref 3.5–5.1)
SODIUM: 142 mmol/L (ref 135–145)
Total Bilirubin: 0.8 mg/dL (ref 0.3–1.2)
Total Protein: 7.1 g/dL (ref 6.5–8.1)

## 2017-05-18 LAB — URINALYSIS, COMPLETE (UACMP) WITH MICROSCOPIC
BACTERIA UA: NONE SEEN
BILIRUBIN URINE: NEGATIVE
Glucose, UA: NEGATIVE mg/dL
HGB URINE DIPSTICK: NEGATIVE
KETONES UR: NEGATIVE mg/dL
Leukocytes, UA: NEGATIVE
NITRITE: NEGATIVE
Protein, ur: NEGATIVE mg/dL
Specific Gravity, Urine: 1.02 (ref 1.005–1.030)
pH: 6 (ref 5.0–8.0)

## 2017-05-18 MED ORDER — FENTANYL 75 MCG/HR TD PT72: 75.0000 ug | MEDICATED_PATCH | TRANSDERMAL | 0 refills | Status: DC

## 2017-05-18 MED ORDER — SODIUM CHLORIDE 0.9% FLUSH
10.0000 mL | INTRAVENOUS | Status: DC | PRN
  Filled 2017-05-18: qty 10

## 2017-05-18 MED ORDER — MINOCYCLINE HCL 100 MG PO CAPS: ORAL_CAPSULE | ORAL | 1 refills | Status: DC

## 2017-05-18 MED ORDER — OXYCODONE HCL 10 MG PO TABS: 10.0000 mg | ORAL_TABLET | Freq: Four times a day (QID) | ORAL | 0 refills | Status: DC | PRN

## 2017-05-18 MED ORDER — HEPARIN SOD (PORK) LOCK FLUSH 100 UNIT/ML IV SOLN
500.0000 [IU] | Freq: Once | INTRAVENOUS | Status: AC
  Administered 2017-05-18: 500 [IU] via INTRAVENOUS

## 2017-05-18 NOTE — Assessment & Plan Note (Addendum)
#    Colon cancer with metastases- peritoneal/omental metastases-status post small bowel resection; on 5FU+Avastin [since may 2018]; AUG 31st CT-Progression- in the liver/omental metastases/lung metastases.   # Reviewed the multiple options of chemotherapy-antibody therapy with the patient/family. 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 Discussed with the patient regarding diarrhea/skin rash [start patient on minocycline/clindamycin gel]; also endocrine abnormalities including low magnesium. Discussed again the treatments are palliative not curative.  # Blood press elevated; at home 140s/80-90s. Secondary to Avastin. This should improve- as patient coming off Avastin.  # Pneumatosis- in the small bowel- likely benign; clinically asymptomatic. Monitor closely for now. Reviewed with the patient.  # pain management/chronic pain-STABLE; Continue  fentanyl 75 g every 72 hours and  oxycodone every 6-8 hours. Refilled again.   # Hypokalemia- today 3.4; stable.   # 9/10- Vectibex alone; 9/24 vctibex; labs-mag/ cea.   # I reviewed the blood work- with the patient in detail; also reviewed the imaging independently [as summarized above]; and with the patient in detail.

## 2017-05-18 NOTE — Progress Notes (Signed)
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 Specimen integrity: Intact. Macroscopic tumor perforation: Not identified. Invasive tumor: Maximum size: 5.5 cm. Histologic type(s): Invasive adenocarcinoma. Histologic grade and differentiation: G1: well differentiated/low grade  Pathologic Staging: pT4b, pN1b, pM1b . 2.Patient was started on FOLFOX in September of 2016,after 2 cycles of chemotherapy patient developed neuropathy. Patient had a previous neuropathy as a baseline which increased so was switched over to FOLFIRI I IN December of 2016 As patient has clear os wild-type we can proceed to add either a Avastin or cetuximab from the next chemotherapy.(November, 2016)  Avastin was added as abdominal wound has completely healed now Avastin on hold (December, 2016) because of hypertension Chemotherapy was discontinued after protocol 8 cycle because of significant side effect (last chemotherapy was on October 28, 2015).  # DEC 2017- small bowel obstruction s/p resection [Dr.Hoxworth]- positive for recurrence. JAN 30th PET- liver mets/  lung/omental-peritoneal mets  # FEB 5th 2018-  FOLFIRI + vectibex [vec x1 stopped sec to diarrhea]; May 31st CT- improved; then May 23rd 2018- 5FU+avastin- CT AUG 31st 2018- Progression  # Hx of Bil Kidney stones  # sarcoidosis/ Lung- [[facility]]- on surveillance  # Neuropathy- lyrica -off. ? 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  male  with above history of recurrent metastatic colon cancer to the liver/omentum on palliative chemo- Currently on 5-FU and Avastin is here for follow-up/Review the results of this restaging CAT scan.  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  He denies any worsening abdominal pain. Denies any worsening nosebleeds or skin rash.  Denies any significant nausea or vomiting.  His wife brings her log of his blood pressures at home. Patient is obviously disappointed with the results of his CT scan.   REVIEW OF SYSTEMS:  A complete 10 point review of system is done which is negative except mentioned above/history of present illness.   PAST MEDICAL HISTORY :  Past Medical History:  Diagnosis Date  . Abdominal pain    mid abdomen  . Adiposity 03/21/2015  . Arthritis   . 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 Colon cancer ([place])   . 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 Extreme obesity 12/21/2012  . GERD (gastroesophageal reflux disease)   . History of kidney stones   . History of transfusion   . Hypertension    has been off BP med x 6 months  . Iron deficiency anemia due to chronic blood loss 05/08/2015  . LBP (low  back pain) 05/28/2014  . Neuropathy   . PONV (postoperative nausea and vomiting)   . Sarcoid 10/2012  . Skin cancer    "it flairs up with  sarcoidosis"    PAST SURGICAL HISTORY :   Past Surgical History:  Procedure Laterality Date  . CARDIAC CATHETERIZATION    . FLEXIBLE SIGMOIDOSCOPY N/A 05/07/2015   Procedure: FLEXIBLE SIGMOIDOSCOPY;  Surgeon: Laurence Spates, MD;  Location: [facility];  Service: Endoscopy;  Laterality: N/A;  . LAPAROSCOPIC PARTIAL COLECTOMY N/A 05/07/2015   Procedure: LAPAROSCOPIC ASSITED PARTIAL COLECTOMY WITH COLOSTOMY, SMALL BOWEL RESECTION, EXCISION OF PERITONEAL NODULE;  Surgeon: [name], MD;  Location: [facility];  Service: General;  Laterality: N/A;  . LAPAROTOMY N/A 08/10/2016   Procedure: EXPLORATORY LAPAROTOMY small bowel resection abdominal wall resection partial omentectomy;  Surgeon: Excell Seltzer, MD;  Location: [facility];  Service: General;  Laterality: N/A;  . PORTACATH PLACEMENT Right 05/27/2015   Procedure: INSERTION PORT-A-CATH;  Surgeon: [name], MD;  Location: [facility];  Service: General;  Laterality: Right;    FAMILY HISTORY :   Family History  Problem Relation Age of Onset  . Arthritis Mother   . Heart disease Mother   . Stroke Father   . Prostate cancer Neg Hx   . Bladder Cancer Neg Hx     SOCIAL HISTORY:   Social History  Substance Use Topics  . Smoking status: Passive Smoke Exposure - Never Smoker  . Smokeless tobacco: Never Used  . Alcohol use No    ALLERGIES:  is allergic to versed [midazolam]; gabapentin; paba derivatives; and benzyl benzoate.  MEDICATIONS:  Current Outpatient Prescriptions  Medication Sig Dispense Refill  . diphenoxylate-atropine (LOMOTIL) 2.5-0.025 MG tablet Take 1 tablet by mouth 4 (four) times daily as needed for diarrhea or loose stools. 30 tablet 3  . fentaNYL (DURAGESIC - DOSED MCG/HR) 75 MCG/HR Place 1 patch (75 mcg total) onto the skin every 3 (three) days. 10 patch 0  . lidocaine-prilocaine (EMLA) cream Apply 1 application topically as needed. Apply small amount to port site at least 1 hour prior to it being accessed, cover with  plastic wrap 30 g 1  . loperamide (IMODIUM A-D) 2 MG tablet Take 2 mg by mouth 4 (four) times daily as needed for diarrhea or loose stools.    Marland Kitchen LORazepam (ATIVAN) 0.5 MG tablet Take every 8 hours as needed for nausea/anxiety. 90 tablet 3  . Oxycodone HCl 10 MG TABS Take 1 tablet (10 mg total) by mouth every 6 (six) hours as needed (severe pain). 60 tablet 0  . prochlorperazine (COMPAZINE) 10 MG tablet Take 1 tablet (10 mg total) by mouth every 6 (six) hours as needed for nausea or vomiting. 60 tablet 3  . promethazine (PHENERGAN) 25 MG tablet Take 1 tablet (25 mg total) by mouth every 6 (six) hours as needed. 60 tablet 6  . sucralfate (CARAFATE) 1 g tablet Take 1 tablet (1 g total) by mouth 4 (four) times daily. 120 tablet 1  . zolpidem (AMBIEN) 10 MG tablet Take 10-15 mg by mouth at bedtime as needed for sleep.    . minocycline (MINOCIN) 100 MG capsule Once a day 60 capsule 1   No current facility-administered medications for this visit.    Facility-Administered Medications Ordered in Other Visits  Medication Dose Route Frequency Provider Last Rate Last Dose  . sodium chloride flush (NS) 0.9 % injection 10 mL  10 mL Intravenous PRN Forest Gleason, MD   10 mL at 02/11/16 1326  PHYSICAL EXAMINATION: ECOG PERFORMANCE STATUS: 0 - Asymptomatic  BP 132/90   Pulse 100 Comment: radial pulse  Temp 98.7 F (37.1 C) (Tympanic)   Resp (!) 22   Ht 5' 11"  (1.803 m)   Wt 276 lb (125.2 kg)   BMI 38.49 kg/m   Filed Weights   05/18/17 1009  Weight: 276 lb (125.2 kg)    GENERAL: Obese Caucasian male patient. Alert, no distress and comfortable.  Obese. Accompanied by is wife. Walking by himself.  EYES: no pallor or icterus OROPHARYNX: no thrush or ulceration; good dentition  NECK: supple, no masses felt LYMPH:  no palpable lymphadenopathy in the cervical, axillary or inguinal regions LUNGS: clear to auscultation and  No wheeze or crackles HEART/CVS: regular rate & rhythm and no murmurs; No  lower extremity edema ABDOMEN:abdomen soft, non-tender and normal bowel sounds; Positive for colostomy. Incisions were healing. Musculoskeletal:no cyanosis of digits and no clubbing  PSYCH: alert & oriented x 3 with fluent speech.  NEURO: no focal motor/sensory deficits SKIN:  no rashes or significant lesions  LABORATORY DATA:  I have reviewed the data as listed    Component Value Date/Time   NA 142 05/18/2017 0942   NA 138 01/02/2015 1555   K 3.4 (L) 05/18/2017 0942   K 3.7 01/02/2015 1555   CL 106 05/18/2017 0942   CL 109 01/02/2015 1555   CO2 27 05/18/2017 0942   CO2 22 01/02/2015 1555   GLUCOSE 99 05/18/2017 0942   GLUCOSE 110 (H) 01/02/2015 1555   BUN 8 05/18/2017 0942   BUN 9 01/02/2015 1555   CREATININE 1.09 05/18/2017 0942   CREATININE 1.04 01/02/2015 1555   CALCIUM 8.6 (L) 05/18/2017 0942   CALCIUM 8.8 (L) 01/02/2015 1555   PROT 7.1 05/18/2017 0942   PROT 8.2 (H) 01/02/2015 1555   ALBUMIN 3.7 05/18/2017 0942   ALBUMIN 4.0 01/02/2015 1555   AST 29 05/18/2017 0942   AST 39 01/02/2015 1555   ALT 21 05/18/2017 0942   ALT 55 01/02/2015 1555   ALKPHOS 79 05/18/2017 0942   ALKPHOS 125 01/02/2015 1555   BILITOT 0.8 05/18/2017 0942   BILITOT 0.5 01/02/2015 1555   GFRNONAA >60 05/18/2017 0942   GFRNONAA >60 01/02/2015 1555   GFRAA >60 05/18/2017 0942   GFRAA >60 01/02/2015 1555    No results found for: SPEP, UPEP  Lab Results  Component Value Date   WBC 6.7 05/18/2017   NEUTROABS 4.4 05/18/2017   HGB 12.9 (L) 05/18/2017   HCT 37.8 (L) 05/18/2017   MCV 88.7 05/18/2017   PLT 178 05/18/2017      Chemistry      Component Value Date/Time   NA 142 05/18/2017 0942   NA 138 01/02/2015 1555   K 3.4 (L) 05/18/2017 0942   K 3.7 01/02/2015 1555   CL 106 05/18/2017 0942   CL 109 01/02/2015 1555   CO2 27 05/18/2017 0942   CO2 22 01/02/2015 1555   BUN 8 05/18/2017 0942   BUN 9 01/02/2015 1555   CREATININE 1.09 05/18/2017 0942   CREATININE 1.04 01/02/2015 1555       Component Value Date/Time   CALCIUM 8.6 (L) 05/18/2017 0942   CALCIUM 8.8 (L) 01/02/2015 1555   ALKPHOS 79 05/18/2017 0942   ALKPHOS 125 01/02/2015 1555   AST 29 05/18/2017 0942   AST 39 01/02/2015 1555   ALT 21 05/18/2017 0942   ALT 55 01/02/2015 1555   BILITOT 0.8 05/18/2017 0942   BILITOT 0.5  01/02/2015 1555     IMPRESSION: 1. Generally worsened malignancy with new and enlarging pulmonary nodules; some stable and some enlarging hepatic metastatic lesions; and variable appearance of the omental/mesenteric deposits of tumor some of which are larger and some of which are smaller than before. 2. A loop of small bowel demonstrates pneumatosis intestinalis, with a cystic component along part of the bowel wall margin. There is also a trace amount of portal venous gas in the adjacent mesentery. Pneumatosis intestinalis can be due to benign/incidental causes, but clearly can also be associated with bowel ischemia/bowel infarct. Worrisome findings include the small amount of portal venous gas and the mild small bowel dilatation in this vicinity. The lack of overt bowel wall thickening is reassuring. If the patient has abdominal pain or of the clinical exam is indeterminate and surgical referral for further assessment may be warranted. 3. Other imaging findings of potential clinical significance: Mild distal esophageal wall thickening, query esophagitis. Diffuse hepatic steatosis. Bilateral nonobstructive nephrolithiasis. Radiology assistant personnel have been notified to put me in telephone contact with the referring physician or the referring physician's clinical representative in order to discuss these findings. Once this communication is established I will issue an addendum to this report for documentation purposes.  Electronically Signed: By: Van Clines M.D. On: 05/13/2017 11:09  RADIOGRAPHIC STUDIES: I have personally reviewed the radiological images as listed  and agreed with the findings in the report. No results found.   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   # Reviewed the multiple options of chemotherapy-antibody therapy with the patient/family. 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 Discussed with the patient regarding diarrhea/skin rash [start patient on minocycline/clindamycin gel]; also endocrine abnormalities including low magnesium. Discussed again the treatments are palliative not curative.  # Blood press elevated; at home 140s/80-90s. Secondary to Avastin. This should improve- as patient coming off Avastin.  # Pneumatosis- in the small bowel- likely benign; clinically asymptomatic. Monitor closely for now. Reviewed with the patient.  # pain management/chronic pain-STABLE; Continue  fentanyl 75 g every 72 hours and  oxycodone every 6-8 hours. Refilled again.   # Hypokalemia- today 3.4; stable.   # 9/10- Vectibex alone; 9/24 vctibex; labs-mag/ cea.   # I reviewed the blood work- with the patient in detail; also reviewed the imaging independently [as summarized above]; and with the patient in detail.    No orders of the defined types were placed in this encounter.    Cammie Sickle, MD 05/18/2017 7:09 PM

## 2017-05-18 NOTE — Progress Notes (Signed)
DISCONTINUE ON PATHWAY REGIMEN - Colorectal     A cycle is every 14 days:     Irinotecan      Leucovorin      5-Fluorouracil      5-Fluorouracil      Bevacizumab   **Always confirm dose/schedule in your pharmacy ordering system**    REASON: Disease Progression PRIOR TREATMENT: [id number]: FOLFIRI + Bevacizumab TREATMENT RESPONSE: Progressive Disease (PD)  START ON PATHWAY REGIMEN - Colorectal     A cycle is every 14 days:     Panitumumab   **Always confirm dose/schedule in your pharmacy ordering system**    Patient Characteristics: Metastatic Colorectal, Second Line, KRAS/NRAS Wild-Type, BRAF Wild-Type/Unknown, No Prior Anti-EGFR Therapy Current evidence of distant metastases<= Yes AJCC T Category: TX AJCC N Category: NX AJCC M Category: M1c AJCC 8 Stage Grouping: IVC BRAF Mutation Status: Wild Type (no mutation) KRAS/NRAS Mutation Status: Wild Type (no mutation) Line of therapy: Second Line Would you be surprised if this patient died  in the next year<= I would be surprised if this patient died in the next year Intent of Therapy: Non-Curative / Palliative Intent, Discussed with Patient

## 2017-05-19 LAB — CEA: CEA: 39.8 ng/mL — ABNORMAL HIGH (ref 0.0–4.7)

## 2017-05-20 ENCOUNTER — Inpatient Hospital Stay: Payer: BLUE CROSS/BLUE SHIELD

## 2017-05-21 ENCOUNTER — Other Ambulatory Visit: Payer: Self-pay | Admitting: Internal Medicine

## 2017-05-23 ENCOUNTER — Inpatient Hospital Stay: Payer: BLUE CROSS/BLUE SHIELD

## 2017-05-23 ENCOUNTER — Other Ambulatory Visit: Payer: Self-pay | Admitting: Internal Medicine

## 2017-05-23 VITALS — BP 147/89 | HR 98 | Temp 98.2°F | Resp 20

## 2017-05-23 DIAGNOSIS — C186 Malignant neoplasm of descending colon: Secondary | ICD-10-CM

## 2017-05-23 DIAGNOSIS — C772 Secondary and unspecified malignant neoplasm of intra-abdominal lymph nodes: Principal | ICD-10-CM

## 2017-05-23 DIAGNOSIS — K521 Toxic gastroenteritis and colitis: Secondary | ICD-10-CM

## 2017-05-23 DIAGNOSIS — T451X5A Adverse effect of antineoplastic and immunosuppressive drugs, initial encounter: Secondary | ICD-10-CM

## 2017-05-23 LAB — MAGNESIUM: MAGNESIUM: 1.7 mg/dL (ref 1.7–2.4)

## 2017-05-23 MED ORDER — SODIUM CHLORIDE 0.9% FLUSH
10.0000 mL | INTRAVENOUS | Status: DC | PRN
  Filled 2017-05-23: qty 10

## 2017-05-23 MED ORDER — PANITUMUMAB CHEMO INJECTION 100 MG/5ML
700.0000 mg | Freq: Once | INTRAVENOUS | Status: AC
  Administered 2017-05-23: 700 mg via INTRAVENOUS
  Filled 2017-05-23: qty 15

## 2017-05-23 MED ORDER — HEPARIN SOD (PORK) LOCK FLUSH 100 UNIT/ML IV SOLN
500.0000 [IU] | Freq: Once | INTRAVENOUS | Status: AC | PRN
  Administered 2017-05-23: 500 [IU]

## 2017-05-23 MED ORDER — SODIUM CHLORIDE 0.9 % IV SOLN
Freq: Once | INTRAVENOUS | Status: AC
  Administered 2017-05-23: 12:00:00 via INTRAVENOUS
  Filled 2017-05-23: qty 1000

## 2017-05-23 MED ORDER — SODIUM CHLORIDE 0.9 % IV SOLN
800.0000 mg | Freq: Once | INTRAVENOUS | Status: DC
  Filled 2017-05-23: qty 40

## 2017-06-06 ENCOUNTER — Inpatient Hospital Stay: Payer: BLUE CROSS/BLUE SHIELD

## 2017-06-06 ENCOUNTER — Inpatient Hospital Stay (HOSPITAL_BASED_OUTPATIENT_CLINIC_OR_DEPARTMENT_OTHER): Payer: BLUE CROSS/BLUE SHIELD | Admitting: Internal Medicine

## 2017-06-06 VITALS — BP 136/80 | HR 62 | Temp 97.2°F | Resp 20 | Ht 71.0 in | Wt 279.0 lb

## 2017-06-06 DIAGNOSIS — R21 Rash and other nonspecific skin eruption: Secondary | ICD-10-CM | POA: Diagnosis not present

## 2017-06-06 DIAGNOSIS — C772 Secondary and unspecified malignant neoplasm of intra-abdominal lymph nodes: Secondary | ICD-10-CM

## 2017-06-06 DIAGNOSIS — C786 Secondary malignant neoplasm of retroperitoneum and peritoneum: Secondary | ICD-10-CM | POA: Diagnosis not present

## 2017-06-06 DIAGNOSIS — E876 Hypokalemia: Secondary | ICD-10-CM | POA: Diagnosis not present

## 2017-06-06 DIAGNOSIS — C7800 Secondary malignant neoplasm of unspecified lung: Secondary | ICD-10-CM | POA: Diagnosis not present

## 2017-06-06 DIAGNOSIS — C787 Secondary malignant neoplasm of liver and intrahepatic bile duct: Secondary | ICD-10-CM

## 2017-06-06 DIAGNOSIS — Z79899 Other long term (current) drug therapy: Secondary | ICD-10-CM

## 2017-06-06 DIAGNOSIS — G893 Neoplasm related pain (acute) (chronic): Secondary | ICD-10-CM

## 2017-06-06 DIAGNOSIS — C186 Malignant neoplasm of descending colon: Secondary | ICD-10-CM

## 2017-06-06 LAB — URINALYSIS, COMPLETE (UACMP) WITH MICROSCOPIC
BILIRUBIN URINE: NEGATIVE
Bacteria, UA: NONE SEEN
Glucose, UA: NEGATIVE mg/dL
Hgb urine dipstick: NEGATIVE
Ketones, ur: NEGATIVE mg/dL
Leukocytes, UA: NEGATIVE
Nitrite: NEGATIVE
Protein, ur: NEGATIVE mg/dL
SPECIFIC GRAVITY, URINE: 1.015 (ref 1.005–1.030)
pH: 7 (ref 5.0–8.0)

## 2017-06-06 LAB — CBC WITH DIFFERENTIAL/PLATELET
Basophils Absolute: 0 10*3/uL (ref 0–0.1)
Basophils Relative: 1 %
EOS ABS: 0.2 10*3/uL (ref 0–0.7)
EOS PCT: 3 %
HCT: 37.8 % — ABNORMAL LOW (ref 40.0–52.0)
Hemoglobin: 13.1 g/dL (ref 13.0–18.0)
LYMPHS ABS: 1.5 10*3/uL (ref 1.0–3.6)
LYMPHS PCT: 20 %
MCH: 31.1 pg (ref 26.0–34.0)
MCHC: 34.7 g/dL (ref 32.0–36.0)
MCV: 89.4 fL (ref 80.0–100.0)
MONO ABS: 0.6 10*3/uL (ref 0.2–1.0)
MONOS PCT: 9 %
Neutro Abs: 4.9 10*3/uL (ref 1.4–6.5)
Neutrophils Relative %: 67 %
PLATELETS: 183 10*3/uL (ref 150–440)
RBC: 4.23 MIL/uL — ABNORMAL LOW (ref 4.40–5.90)
RDW: 16.1 % — ABNORMAL HIGH (ref 11.5–14.5)
WBC: 7.3 10*3/uL (ref 3.8–10.6)

## 2017-06-06 LAB — COMPREHENSIVE METABOLIC PANEL
ALT: 19 U/L (ref 17–63)
ANION GAP: 7 (ref 5–15)
AST: 32 U/L (ref 15–41)
Albumin: 3.7 g/dL (ref 3.5–5.0)
Alkaline Phosphatase: 91 U/L (ref 38–126)
BUN: 5 mg/dL — ABNORMAL LOW (ref 6–20)
CALCIUM: 8.6 mg/dL — AB (ref 8.9–10.3)
CHLORIDE: 103 mmol/L (ref 101–111)
CO2: 29 mmol/L (ref 22–32)
CREATININE: 0.92 mg/dL (ref 0.61–1.24)
Glucose, Bld: 117 mg/dL — ABNORMAL HIGH (ref 65–99)
Potassium: 3 mmol/L — ABNORMAL LOW (ref 3.5–5.1)
SODIUM: 139 mmol/L (ref 135–145)
Total Bilirubin: 0.7 mg/dL (ref 0.3–1.2)
Total Protein: 7.1 g/dL (ref 6.5–8.1)

## 2017-06-06 LAB — MAGNESIUM: Magnesium: 2 mg/dL (ref 1.7–2.4)

## 2017-06-06 MED ORDER — SODIUM CHLORIDE 0.9% FLUSH
10.0000 mL | INTRAVENOUS | Status: DC | PRN
  Filled 2017-06-06: qty 10

## 2017-06-06 MED ORDER — HEPARIN SOD (PORK) LOCK FLUSH 100 UNIT/ML IV SOLN: 500.0000 [IU] | Freq: Once | INTRAVENOUS | Status: DC | PRN

## 2017-06-06 MED ORDER — OXYCODONE HCL 10 MG PO TABS: 10.0000 mg | ORAL_TABLET | Freq: Four times a day (QID) | ORAL | 0 refills | Status: DC | PRN

## 2017-06-06 MED ORDER — SODIUM CHLORIDE 0.9% FLUSH
10.0000 mL | INTRAVENOUS | Status: DC | PRN
  Administered 2017-06-06: 10 mL via INTRAVENOUS
  Filled 2017-06-06: qty 10

## 2017-06-06 MED ORDER — FENTANYL 75 MCG/HR TD PT72: 75.0000 ug | MEDICATED_PATCH | TRANSDERMAL | 0 refills | Status: DC

## 2017-06-06 MED ORDER — SODIUM CHLORIDE 0.9 % IV SOLN
800.0000 mg | Freq: Once | INTRAVENOUS | Status: AC
  Administered 2017-06-06: 800 mg via INTRAVENOUS
  Filled 2017-06-06: qty 40

## 2017-06-06 MED ORDER — SODIUM CHLORIDE 0.9 % IV SOLN
Freq: Once | INTRAVENOUS | Status: AC
  Administered 2017-06-06: 10:00:00 via INTRAVENOUS
  Filled 2017-06-06: qty 1000

## 2017-06-06 MED ORDER — HEPARIN SOD (PORK) LOCK FLUSH 100 UNIT/ML IV SOLN
500.0000 [IU] | Freq: Once | INTRAVENOUS | Status: AC
  Administered 2017-06-06: 500 [IU] via INTRAVENOUS
  Filled 2017-06-06: qty 5

## 2017-06-06 NOTE — Progress Notes (Signed)
Nutrition Follow-up:  Nutrition follow-up completed in infusion with wife at chairside.  Patient with recurrent metastatic colon cancer to liver/omentum on palliative chemo on vectibex.   Patient reports abdominal pain, constipation today.  Patient reports appetite has been fairly good.  Has been eating pancakes for breakfast as settles on stomach better after taking medication. Wife reports patient is a meat and potatoes guy. Also likes jello, ice cream, freeze pops.     Medications: reviewed  Labs: reviewed  Anthropometrics:   Weight 279 lb today increased from last nutrition visit on 4/30.     NUTRITION DIAGNOSIS: Unintentional weight loss stable   MALNUTRITION DIAGNOSIS: Moderate malnutrition improving   INTERVENTION:   Encouraged good sources of protein.   Patient with questions regarding constipation. Discussed strategies but challenged due to ostomy.  Wife reports patient takes medication at times for constipation.     MONITORING, EVALUATION, GOAL: weight trends, intake   NEXT VISIT: Monday, October 8 during infusion  Adriene Padula B. Zenia Resides, [place], [place] Registered Dietitian [phone] (pager)

## 2017-06-06 NOTE — Assessment & Plan Note (Addendum)
#    Colon cancer with metastases- peritoneal/omental metastases-status post small bowel resection;  AUG 31st CT-Progression- in the liver/omental metastases/lung metastases. Currently on vectibex single agent.   # On vectibex; proceed with cycle #2 today. Tolerating well except for mild acneform rash. Check magnesium today.  # Blood press elevated; at home 140s/80-90s. Improved off avastin.  # Pneumatosis- in the small bowel- likely benign; clinically asymptomatic. Monitor closely especially with the intermittent abdominal pain  # pain management/chronic pain-STABLE; Continue  fentanyl 75 g every 72 hours and  oxycodone every 6-8 hours. Refilled again.   # Hypokalemia- today 3.0; stable.   # follow up in 2 weeks/labs/ cea;mag. Possible K.

## 2017-06-06 NOTE — Progress Notes (Signed)
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 Specimen integrity: Intact. Macroscopic tumor perforation: Not identified. Invasive tumor: Maximum size: 5.5 cm. Histologic type(s): Invasive adenocarcinoma. Histologic grade and differentiation: G1: well differentiated/low grade  Pathologic Staging: pT4b, pN1b, pM1b . 2.Patient was started on FOLFOX in September of 2016,after 2 cycles of chemotherapy patient developed neuropathy. Patient had a previous neuropathy as a baseline which increased so was switched over to FOLFIRI I IN December of 2016 As patient has clear os wild-type we can proceed to add either a Avastin or cetuximab from the next chemotherapy.(November, 2016)  Avastin was added as abdominal wound has completely healed now Avastin on hold (December, 2016) because of hypertension Chemotherapy was discontinued after protocol 8 cycle because of significant side effect (last chemotherapy was on October 28, 2015).  # DEC 2017- small bowel obstruction s/p resection [Dr.Hoxworth]- positive for recurrence. JAN 30th PET- liver mets/  lung/omental-peritoneal mets  # FEB 5th 2018-  FOLFIRI + vectibex [vec x1 stopped sec to diarrhea]; May 31st CT- improved; then May 23rd 2018- 5FU+avastin- CT AUG 31st 2018- Progression  # Hx of Bil Kidney stones  # sarcoidosis/ Lung- [[facility]]- on surveillance  # Neuropathy- lyrica -off. ? 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  male  with above history of recurrent metastatic colon cancer to the liver/omentum on palliative chemo- currently on Vectibex Is here for follow-up.  Patient is currently status post cycle #1. He noted to have acne-like rash on the face; however not significantly worse.  He notes to have intermittent worsening abdominal pain; however not significantly worse from his baseline. Denies any worsening nosebleeds or skin rash.  Denies any significant nausea or vomiting.  His wife brings her log of his blood pressures at home. Patient's emotional/tearful.   He had episode of cold/clammy/sweating feeling while in the room. No chest pain/nausea or vomitting.   REVIEW OF SYSTEMS:  A complete 10 point review of system is done which is negative except mentioned above/history of present illness.   PAST MEDICAL HISTORY :  Past Medical History:  Diagnosis Date  . Abdominal pain    mid abdomen  . Adiposity 03/21/2015  . Arthritis   . 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 Colon cancer ([place])   . 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 Extreme obesity 12/21/2012  . GERD (gastroesophageal reflux disease)   . History of kidney stones   . History of transfusion   . Hypertension    has been off BP med x 6 months  . Iron deficiency  anemia due to chronic blood loss 05/08/2015  . LBP (low back pain) 05/28/2014  . Neuropathy   . PONV (postoperative nausea and  vomiting)   . Sarcoid 10/2012  . Skin cancer    "it flairs up with sarcoidosis"    PAST SURGICAL HISTORY :   Past Surgical History:  Procedure Laterality Date  . CARDIAC CATHETERIZATION    . FLEXIBLE SIGMOIDOSCOPY N/A 05/07/2015   Procedure: FLEXIBLE SIGMOIDOSCOPY;  Surgeon: Laurence Spates, MD;  Location: [facility];  Service: Endoscopy;  Laterality: N/A;  . LAPAROSCOPIC PARTIAL COLECTOMY N/A 05/07/2015   Procedure: LAPAROSCOPIC ASSITED PARTIAL COLECTOMY WITH COLOSTOMY, SMALL BOWEL RESECTION, EXCISION OF PERITONEAL NODULE;  Surgeon: [name], MD;  Location: [facility];  Service: General;  Laterality: N/A;  . LAPAROTOMY N/A 08/10/2016   Procedure: EXPLORATORY LAPAROTOMY small bowel resection abdominal wall resection partial omentectomy;  Surgeon: Excell Seltzer, MD;  Location: [facility];  Service: General;  Laterality: N/A;  . PORTACATH PLACEMENT Right 05/27/2015   Procedure: INSERTION PORT-A-CATH;  Surgeon: [name], MD;  Location: [facility];  Service: General;  Laterality: Right;    FAMILY HISTORY :   Family History  Problem Relation Age of Onset  . Arthritis Mother   . Heart disease Mother   . Stroke Father   . Prostate cancer Neg Hx   . Bladder Cancer Neg Hx     SOCIAL HISTORY:   Social History  Substance Use Topics  . Smoking status: Passive Smoke Exposure - Never Smoker  . Smokeless tobacco: Never Used  . Alcohol use No    ALLERGIES:  is allergic to versed [midazolam]; gabapentin; paba derivatives; and benzyl benzoate.  MEDICATIONS:  Current Outpatient Prescriptions  Medication Sig Dispense Refill  . fentaNYL (DURAGESIC - DOSED MCG/HR) 75 MCG/HR Place 1 patch (75 mcg total) onto the skin every 3 (three) days. 10 patch 0  . lidocaine-prilocaine (EMLA) cream Apply 1 application topically as needed. Apply small amount to port site at least 1 hour prior to it being accessed, cover with plastic wrap 30 g 1  . LORazepam (ATIVAN) 0.5 MG tablet Take every 8 hours as  needed for nausea/anxiety. 90 tablet 3  . minocycline (MINOCIN) 100 MG capsule Once a day 60 capsule 1  . Oxycodone HCl 10 MG TABS Take 1 tablet (10 mg total) by mouth every 6 (six) hours as needed (severe pain). 60 tablet 0  . prochlorperazine (COMPAZINE) 10 MG tablet Take 1 tablet (10 mg total) by mouth every 6 (six) hours as needed for nausea or vomiting. 60 tablet 3  . promethazine (PHENERGAN) 25 MG tablet Take 1 tablet (25 mg total) by mouth every 6 (six) hours as needed. 60 tablet 6  . sucralfate (CARAFATE) 1 g tablet TAKE 1 TABLET (1 G TOTAL) BY MOUTH 4 (FOUR) TIMES DAILY. 120 tablet 1  . zolpidem (AMBIEN) 10 MG tablet Take 10-15 mg by mouth at bedtime as needed for sleep.    . diphenoxylate-atropine (LOMOTIL) 2.5-0.025 MG tablet TAKE 1 TABLET BY MOUTH 4 TIMES DAILY AS NEEDED FOR DIARRHEA OR LOOSE STOOLS (Patient not taking: Reported on 06/06/2017) 30 tablet 1  . loperamide (IMODIUM A-D) 2 MG tablet Take 2 mg by mouth 4 (four) times daily as needed for diarrhea or loose stools.     No current facility-administered medications for this visit.    Facility-Administered Medications Ordered in Other Visits  Medication Dose Route Frequency Provider Last Rate Last Dose  . heparin lock flush 100 unit/mL  500 Units  Intravenous Once Charlaine Dalton R, MD      . heparin lock flush 100 unit/mL  500 Units Intracatheter Once PRN Cammie Sickle, MD      . panitumumab (VECTIBIX) 800 mg in sodium chloride 0.9 % 100 mL chemo infusion  800 mg Intravenous Once Charlaine Dalton R, MD      . sodium chloride flush (NS) 0.9 % injection 10 mL  10 mL Intravenous PRN Forest Gleason, MD   10 mL at 02/11/16 1326  . sodium chloride flush (NS) 0.9 % injection 10 mL  10 mL Intravenous PRN Cammie Sickle, MD   10 mL at 06/06/17 0855  . sodium chloride flush (NS) 0.9 % injection 10 mL  10 mL Intracatheter PRN Cammie Sickle, MD        PHYSICAL EXAMINATION: ECOG PERFORMANCE STATUS: 0 -  Asymptomatic  BP 136/80 (Patient Position: Sitting)   Pulse 62   Temp (!) 97.2 F (36.2 C) (Tympanic)   Resp 20   Ht 5' 11"  (1.803 m)   Wt 279 lb (126.6 kg)   BMI 38.91 kg/m   Filed Weights   06/06/17 0912  Weight: 279 lb (126.6 kg)    GENERAL: Obese Caucasian male patient. Alert, no distress and comfortable.  Obese. Accompanied by is wife. Walking by himself.  EYES: no pallor or icterus OROPHARYNX: no thrush or ulceration; good dentition  NECK: supple, no masses felt LYMPH:  no palpable lymphadenopathy in the cervical, axillary or inguinal regions LUNGS: clear to auscultation and  No wheeze or crackles HEART/CVS: regular rate & rhythm and no murmurs; No lower extremity edema ABDOMEN:abdomen soft, non-tender and normal bowel sounds; Positive for colostomy. Incisions were healing. Musculoskeletal:no cyanosis of digits and no clubbing  PSYCH: alert & oriented x 3 with fluent speech. Tearful.  NEURO: no focal motor/sensory deficits SKIN:  no rashes or significant lesions  LABORATORY DATA:  I have reviewed the data as listed    Component Value Date/Time   NA 139 06/06/2017 0905   NA 138 01/02/2015 1555   K 3.0 (L) 06/06/2017 0905   K 3.7 01/02/2015 1555   CL 103 06/06/2017 0905   CL 109 01/02/2015 1555   CO2 29 06/06/2017 0905   CO2 22 01/02/2015 1555   GLUCOSE 117 (H) 06/06/2017 0905   GLUCOSE 110 (H) 01/02/2015 1555   BUN 5 (L) 06/06/2017 0905   BUN 9 01/02/2015 1555   CREATININE 0.92 06/06/2017 0905   CREATININE 1.04 01/02/2015 1555   CALCIUM 8.6 (L) 06/06/2017 0905   CALCIUM 8.8 (L) 01/02/2015 1555   PROT 7.1 06/06/2017 0905   PROT 8.2 (H) 01/02/2015 1555   ALBUMIN 3.7 06/06/2017 0905   ALBUMIN 4.0 01/02/2015 1555   AST 32 06/06/2017 0905   AST 39 01/02/2015 1555   ALT 19 06/06/2017 0905   ALT 55 01/02/2015 1555   ALKPHOS 91 06/06/2017 0905   ALKPHOS 125 01/02/2015 1555   BILITOT 0.7 06/06/2017 0905   BILITOT 0.5 01/02/2015 1555   GFRNONAA >60 06/06/2017  0905   GFRNONAA >60 01/02/2015 1555   GFRAA >60 06/06/2017 0905   GFRAA >60 01/02/2015 1555    No results found for: SPEP, UPEP  Lab Results  Component Value Date   WBC 7.3 06/06/2017   NEUTROABS 4.9 06/06/2017   HGB 13.1 06/06/2017   HCT 37.8 (L) 06/06/2017   MCV 89.4 06/06/2017   PLT 183 06/06/2017      Chemistry      Component Value  Date/Time   NA 139 06/06/2017 0905   NA 138 01/02/2015 1555   K 3.0 (L) 06/06/2017 0905   K 3.7 01/02/2015 1555   CL 103 06/06/2017 0905   CL 109 01/02/2015 1555   CO2 29 06/06/2017 0905   CO2 22 01/02/2015 1555   BUN 5 (L) 06/06/2017 0905   BUN 9 01/02/2015 1555   CREATININE 0.92 06/06/2017 0905   CREATININE 1.04 01/02/2015 1555      Component Value Date/Time   CALCIUM 8.6 (L) 06/06/2017 0905   CALCIUM 8.8 (L) 01/02/2015 1555   ALKPHOS 91 06/06/2017 0905   ALKPHOS 125 01/02/2015 1555   AST 32 06/06/2017 0905   AST 39 01/02/2015 1555   ALT 19 06/06/2017 0905   ALT 55 01/02/2015 1555   BILITOT 0.7 06/06/2017 0905   BILITOT 0.5 01/02/2015 1555     IMPRESSION: 1. Generally worsened malignancy with new and enlarging pulmonary nodules; some stable and some enlarging hepatic metastatic lesions; and variable appearance of the omental/mesenteric deposits of tumor some of which are larger and some of which are smaller than before. 2. A loop of small bowel demonstrates pneumatosis intestinalis, with a cystic component along part of the bowel wall margin. There is also a trace amount of portal venous gas in the adjacent mesentery. Pneumatosis intestinalis can be due to benign/incidental causes, but clearly can also be associated with bowel ischemia/bowel infarct. Worrisome findings include the small amount of portal venous gas and the mild small bowel dilatation in this vicinity. The lack of overt bowel wall thickening is reassuring. If the patient has abdominal pain or of the clinical exam is indeterminate and surgical referral for  further assessment may be warranted. 3. Other imaging findings of potential clinical significance: Mild distal esophageal wall thickening, query esophagitis. Diffuse hepatic steatosis. Bilateral nonobstructive nephrolithiasis. Radiology assistant personnel have been notified to put me in telephone contact with the referring physician or the referring physician's clinical representative in order to discuss these findings. Once this communication is established I will issue an addendum to this report for documentation purposes.  Electronically Signed: By: Van Clines M.D. On: 05/13/2017 11:09  RADIOGRAPHIC STUDIES: I have personally reviewed the radiological images as listed and agreed with the findings in the report. No results found.   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 Currently on vectibex single agent.   # On vectibex; proceed with cycle #2 today. Tolerating well except for mild acneform rash. Check magnesium today.  # Blood press elevated; at home 140s/80-90s. Improved off avastin.  # Pneumatosis- in the small bowel- likely benign; clinically asymptomatic. Monitor closely especially with the intermittent abdominal pain  # pain management/chronic pain-STABLE; Continue  fentanyl 75 g every 72 hours and  oxycodone every 6-8 hours. Refilled again.   # Hypokalemia- today 3.0; stable.   # follow up in 2 weeks/labs/ cea;mag. Possible K.   Orders Placed This Encounter  Procedures  . Magnesium    Standing Status:   Future    Number of Occurrences:   1    Standing Expiration Date:   06/06/2018     Cammie Sickle, MD 06/06/2017 10:24 AM

## 2017-06-06 NOTE — Progress Notes (Signed)
Patient c/o diaphoretic sweat. Pt cold and clammy to touch. Facial flushing. Pt c/o intermittent right upper quadrant cramping- has not had a BM since last evening. Stools are very hard. Pt reports constipation.  Pt denies any pain at this time.

## 2017-06-07 LAB — CEA: CEA: 40.5 ng/mL — ABNORMAL HIGH (ref 0.0–4.7)

## 2017-06-13 ENCOUNTER — Other Ambulatory Visit: Payer: Self-pay | Admitting: *Deleted

## 2017-06-13 MED ORDER — CLINDAMYCIN PHOSPHATE 1 % EX GEL: Freq: Every day | CUTANEOUS | 1 refills | Status: DC

## 2017-06-13 NOTE — Telephone Encounter (Signed)
Wife reports that patient has a rash again and needs refill on his Clindagel. Please advise if this can be refilled

## 2017-06-15 ENCOUNTER — Other Ambulatory Visit: Payer: Self-pay | Admitting: *Deleted

## 2017-06-15 MED ORDER — SUCRALFATE 1 G PO TABS: 1.0000 g | ORAL_TABLET | Freq: Four times a day (QID) | ORAL | 1 refills | Status: AC

## 2017-06-17 ENCOUNTER — Other Ambulatory Visit: Payer: Self-pay | Admitting: Internal Medicine

## 2017-06-17 DIAGNOSIS — C772 Secondary and unspecified malignant neoplasm of intra-abdominal lymph nodes: Principal | ICD-10-CM

## 2017-06-17 DIAGNOSIS — C186 Malignant neoplasm of descending colon: Secondary | ICD-10-CM

## 2017-06-17 DIAGNOSIS — G893 Neoplasm related pain (acute) (chronic): Secondary | ICD-10-CM

## 2017-06-20 ENCOUNTER — Inpatient Hospital Stay: Payer: BLUE CROSS/BLUE SHIELD

## 2017-06-20 ENCOUNTER — Inpatient Hospital Stay: Payer: BLUE CROSS/BLUE SHIELD | Attending: Internal Medicine | Admitting: Internal Medicine

## 2017-06-20 ENCOUNTER — Inpatient Hospital Stay: Payer: BLUE CROSS/BLUE SHIELD | Admitting: *Deleted

## 2017-06-20 VITALS — BP 118/70 | HR 78 | Temp 97.6°F | Resp 22 | Ht 71.0 in | Wt 278.0 lb

## 2017-06-20 DIAGNOSIS — C186 Malignant neoplasm of descending colon: Secondary | ICD-10-CM | POA: Insufficient documentation

## 2017-06-20 DIAGNOSIS — Z79899 Other long term (current) drug therapy: Secondary | ICD-10-CM | POA: Insufficient documentation

## 2017-06-20 DIAGNOSIS — R21 Rash and other nonspecific skin eruption: Secondary | ICD-10-CM | POA: Insufficient documentation

## 2017-06-20 DIAGNOSIS — Z87442 Personal history of urinary calculi: Secondary | ICD-10-CM | POA: Insufficient documentation

## 2017-06-20 DIAGNOSIS — E669 Obesity, unspecified: Secondary | ICD-10-CM | POA: Diagnosis not present

## 2017-06-20 DIAGNOSIS — C7800 Secondary malignant neoplasm of unspecified lung: Secondary | ICD-10-CM | POA: Insufficient documentation

## 2017-06-20 DIAGNOSIS — Z7722 Contact with and (suspected) exposure to environmental tobacco smoke (acute) (chronic): Secondary | ICD-10-CM | POA: Diagnosis not present

## 2017-06-20 DIAGNOSIS — L308 Other specified dermatitis: Secondary | ICD-10-CM | POA: Diagnosis not present

## 2017-06-20 DIAGNOSIS — D869 Sarcoidosis, unspecified: Secondary | ICD-10-CM | POA: Diagnosis not present

## 2017-06-20 DIAGNOSIS — C786 Secondary malignant neoplasm of retroperitoneum and peritoneum: Secondary | ICD-10-CM | POA: Insufficient documentation

## 2017-06-20 DIAGNOSIS — Z85828 Personal history of other malignant neoplasm of skin: Secondary | ICD-10-CM | POA: Insufficient documentation

## 2017-06-20 DIAGNOSIS — C772 Secondary and unspecified malignant neoplasm of intra-abdominal lymph nodes: Secondary | ICD-10-CM

## 2017-06-20 DIAGNOSIS — I10 Essential (primary) hypertension: Secondary | ICD-10-CM | POA: Insufficient documentation

## 2017-06-20 DIAGNOSIS — R918 Other nonspecific abnormal finding of lung field: Secondary | ICD-10-CM | POA: Insufficient documentation

## 2017-06-20 DIAGNOSIS — G893 Neoplasm related pain (acute) (chronic): Secondary | ICD-10-CM

## 2017-06-20 DIAGNOSIS — K219 Gastro-esophageal reflux disease without esophagitis: Secondary | ICD-10-CM | POA: Insufficient documentation

## 2017-06-20 DIAGNOSIS — Z9049 Acquired absence of other specified parts of digestive tract: Secondary | ICD-10-CM | POA: Insufficient documentation

## 2017-06-20 DIAGNOSIS — E876 Hypokalemia: Secondary | ICD-10-CM | POA: Diagnosis not present

## 2017-06-20 DIAGNOSIS — C787 Secondary malignant neoplasm of liver and intrahepatic bile duct: Secondary | ICD-10-CM | POA: Insufficient documentation

## 2017-06-20 LAB — CBC WITH DIFFERENTIAL/PLATELET
BASOS PCT: 1 %
Basophils Absolute: 0 10*3/uL (ref 0–0.1)
EOS ABS: 0.2 10*3/uL (ref 0–0.7)
EOS PCT: 3 %
HCT: 41.4 % (ref 40.0–52.0)
HEMOGLOBIN: 13.8 g/dL (ref 13.0–18.0)
LYMPHS ABS: 1.4 10*3/uL (ref 1.0–3.6)
Lymphocytes Relative: 20 %
MCH: 30.5 pg (ref 26.0–34.0)
MCHC: 33.4 g/dL (ref 32.0–36.0)
MCV: 91.2 fL (ref 80.0–100.0)
Monocytes Absolute: 0.8 10*3/uL (ref 0.2–1.0)
Monocytes Relative: 11 %
NEUTROS PCT: 65 %
Neutro Abs: 4.7 10*3/uL (ref 1.4–6.5)
PLATELETS: 224 10*3/uL (ref 150–440)
RBC: 4.54 MIL/uL (ref 4.40–5.90)
RDW: 15.7 % — AB (ref 11.5–14.5)
WBC: 7 10*3/uL (ref 3.8–10.6)

## 2017-06-20 LAB — URINALYSIS, COMPLETE (UACMP) WITH MICROSCOPIC
BACTERIA UA: NONE SEEN
BILIRUBIN URINE: NEGATIVE
Glucose, UA: NEGATIVE mg/dL
KETONES UR: NEGATIVE mg/dL
Leukocytes, UA: NEGATIVE
Nitrite: NEGATIVE
PH: 7 (ref 5.0–8.0)
Protein, ur: 100 mg/dL — AB
Specific Gravity, Urine: 1.014 (ref 1.005–1.030)

## 2017-06-20 LAB — COMPREHENSIVE METABOLIC PANEL
ALT: 23 U/L (ref 17–63)
ANION GAP: 10 (ref 5–15)
AST: 37 U/L (ref 15–41)
Albumin: 3.7 g/dL (ref 3.5–5.0)
Alkaline Phosphatase: 93 U/L (ref 38–126)
BUN: 5 mg/dL — ABNORMAL LOW (ref 6–20)
CALCIUM: 8.9 mg/dL (ref 8.9–10.3)
CHLORIDE: 103 mmol/L (ref 101–111)
CO2: 29 mmol/L (ref 22–32)
CREATININE: 0.93 mg/dL (ref 0.61–1.24)
Glucose, Bld: 114 mg/dL — ABNORMAL HIGH (ref 65–99)
POTASSIUM: 3 mmol/L — AB (ref 3.5–5.1)
Sodium: 142 mmol/L (ref 135–145)
TOTAL PROTEIN: 7.4 g/dL (ref 6.5–8.1)
Total Bilirubin: 0.7 mg/dL (ref 0.3–1.2)

## 2017-06-20 MED ORDER — LIDOCAINE-PRILOCAINE 2.5-2.5 % EX CREA: 1.0000 "application " | TOPICAL_CREAM | CUTANEOUS | 1 refills | Status: AC | PRN

## 2017-06-20 MED ORDER — OXYCODONE HCL 10 MG PO TABS: 10.0000 mg | ORAL_TABLET | Freq: Four times a day (QID) | ORAL | 0 refills | Status: DC | PRN

## 2017-06-20 MED ORDER — HEPARIN SOD (PORK) LOCK FLUSH 100 UNIT/ML IV SOLN
500.0000 [IU] | Freq: Once | INTRAVENOUS | Status: AC
  Administered 2017-06-20: 500 [IU] via INTRAVENOUS
  Filled 2017-06-20: qty 5

## 2017-06-20 MED ORDER — SODIUM CHLORIDE 0.9% FLUSH
10.0000 mL | Freq: Once | INTRAVENOUS | Status: AC
  Administered 2017-06-20: 10 mL via INTRAVENOUS
  Filled 2017-06-20: qty 10

## 2017-06-20 MED ORDER — MINOCYCLINE HCL 100 MG PO CAPS: ORAL_CAPSULE | ORAL | 1 refills | Status: DC

## 2017-06-20 MED ORDER — PROCHLORPERAZINE MALEATE 10 MG PO TABS: 10.0000 mg | ORAL_TABLET | Freq: Four times a day (QID) | ORAL | 3 refills | Status: DC | PRN

## 2017-06-20 NOTE — Progress Notes (Signed)
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 Specimen integrity: Intact. Macroscopic tumor perforation: Not identified. Invasive tumor: Maximum size: 5.5 cm. Histologic type(s): Invasive adenocarcinoma. Histologic grade and differentiation: G1: well differentiated/low grade  Pathologic Staging: pT4b, pN1b, pM1b . 2.Patient was started on FOLFOX in September of 2016,after 2 cycles of chemotherapy patient developed neuropathy. Patient had a previous neuropathy as a baseline which increased so was switched over to FOLFIRI I IN December of 2016 As patient has clear os wild-type we can proceed to add either a Avastin or cetuximab from the next chemotherapy.(November, 2016)  Avastin was added as abdominal wound has completely healed now Avastin on hold (December, 2016) because of hypertension Chemotherapy was discontinued after protocol 8 cycle because of significant side effect (last chemotherapy was on October 28, 2015).  # DEC 2017- small bowel obstruction s/p resection [Dr.Hoxworth]- positive for recurrence. JAN 30th PET- liver mets/  lung/omental-peritoneal mets  # FEB 5th 2018-  FOLFIRI + vectibex [vec x1 stopped sec to diarrhea]; May 31st CT- improved; then May 23rd 2018- 5FU+avastin- CT AUG 31st 2018- Progression  # Hx of Bil Kidney stones  # sarcoidosis/ Lung- [[facility]]- on surveillance  # Neuropathy- lyrica -off. ? 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  male  with above history of recurrent metastatic colon cancer to the liver/omentum on palliative chemo- currently on Vectibex Is here for follow-up.  Patient is currently status post cycle #2. He noted to have acne-like rash on the face;which seems to be worse as per patient.  Patient notes to have ulcerated lesion next to the ostomy- started to develop 2 weeks ago. He had similar lesions which come and go. However this lesion continues to be getting worse. He is concerned about the chemotherapy during this period  He notes to have intermittent worsening abdominal pain; however not significantly worse from his baseline.  Denies any significant nausea or vomiting.    REVIEW OF SYSTEMS:  A complete 10 point review of system is done which is negative except mentioned above/history of present illness.   PAST MEDICAL HISTORY :  Past Medical History:  Diagnosis Date  . Abdominal pain    mid abdomen  . Adiposity 03/21/2015  . Arthritis   . 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 Colon cancer ([place])   . 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 Extreme obesity 12/21/2012  . GERD (gastroesophageal reflux disease)   . History of kidney stones   . History of transfusion   . Hypertension    has been off BP  med x 6 months  . Iron deficiency anemia due to chronic blood loss 05/08/2015  . LBP (low back pain) 05/28/2014  . Neuropathy   . PONV (postoperative  nausea and vomiting)   . Sarcoid 10/2012  . Skin cancer    "it flairs up with sarcoidosis"    PAST SURGICAL HISTORY :   Past Surgical History:  Procedure Laterality Date  . CARDIAC CATHETERIZATION    . FLEXIBLE SIGMOIDOSCOPY N/A 05/07/2015   Procedure: FLEXIBLE SIGMOIDOSCOPY;  Surgeon: Laurence Spates, MD;  Location: [facility];  Service: Endoscopy;  Laterality: N/A;  . LAPAROSCOPIC PARTIAL COLECTOMY N/A 05/07/2015   Procedure: LAPAROSCOPIC ASSITED PARTIAL COLECTOMY WITH COLOSTOMY, SMALL BOWEL RESECTION, EXCISION OF PERITONEAL NODULE;  Surgeon: [name], MD;  Location: [facility];  Service: General;  Laterality: N/A;  . LAPAROTOMY N/A 08/10/2016   Procedure: EXPLORATORY LAPAROTOMY small bowel resection abdominal wall resection partial omentectomy;  Surgeon: Excell Seltzer, MD;  Location: [facility];  Service: General;  Laterality: N/A;  . PORTACATH PLACEMENT Right 05/27/2015   Procedure: INSERTION PORT-A-CATH;  Surgeon: [name], MD;  Location: [facility];  Service: General;  Laterality: Right;    FAMILY HISTORY :   Family History  Problem Relation Age of Onset  . Arthritis Mother   . Heart disease Mother   . Stroke Father   . Prostate cancer Neg Hx   . Bladder Cancer Neg Hx     SOCIAL HISTORY:   Social History  Substance Use Topics  . Smoking status: Passive Smoke Exposure - Never Smoker  . Smokeless tobacco: Never Used  . Alcohol use No    ALLERGIES:  is allergic to versed [midazolam]; gabapentin; paba derivatives; and benzyl benzoate.  MEDICATIONS:  Current Outpatient Prescriptions  Medication Sig Dispense Refill  . clindamycin (CLINDAGEL) 1 % gel Apply topically daily. 30 g 1  . diphenoxylate-atropine (LOMOTIL) 2.5-0.025 MG tablet TAKE 1 TABLET BY MOUTH 4 TIMES DAILY AS NEEDED FOR DIARRHEA OR LOOSE STOOLS (Patient not taking: Reported on 06/06/2017) 30 tablet 1  . fentaNYL (DURAGESIC - DOSED MCG/HR) 75 MCG/HR Place 1 patch (75 mcg total) onto the skin every 3 (three)  days. 10 patch 0  . lidocaine-prilocaine (EMLA) cream Apply 1 application topically as needed. Apply small amount to port site at least 1 hour prior to it being accessed, cover with plastic wrap 30 g 1  . loperamide (IMODIUM A-D) 2 MG tablet Take 2 mg by mouth 4 (four) times daily as needed for diarrhea or loose stools.    Marland Kitchen LORazepam (ATIVAN) 0.5 MG tablet Take every 8 hours as needed for nausea/anxiety. 90 tablet 3  . minocycline (MINOCIN) 100 MG capsule Every 12 hours 60 capsule 1  . Oxycodone HCl 10 MG TABS Take 1 tablet (10 mg total) by mouth every 6 (six) hours as needed (severe pain). 60 tablet 0  . prochlorperazine (COMPAZINE) 10 MG tablet Take 1 tablet (10 mg total) by mouth every 6 (six) hours as needed for nausea or vomiting. 60 tablet 3  . promethazine (PHENERGAN) 25 MG tablet Take 1 tablet (25 mg total) by mouth every 6 (six) hours as needed. 60 tablet 6  . sucralfate (CARAFATE) 1 g tablet Take 1 tablet (1 g total) by mouth 4 (four) times daily. 120 tablet 1  . zolpidem (AMBIEN) 10 MG tablet Take 10-15 mg by mouth at bedtime as needed for sleep.     No current facility-administered medications for this visit.    Facility-Administered Medications Ordered in Other  Visits  Medication Dose Route Frequency Provider Last Rate Last Dose  . sodium chloride flush (NS) 0.9 % injection 10 mL  10 mL Intravenous PRN Forest Gleason, MD   10 mL at 02/11/16 1326    PHYSICAL EXAMINATION: ECOG PERFORMANCE STATUS: 0 - Asymptomatic  BP 118/70 (BP Location: Right Arm, Patient Position: Sitting) Comment (BP Location): manual bp  Pulse 78   Temp 97.6 F (36.4 C) (Tympanic)   Resp (!) 22   Ht 5' 11"  (1.803 m)   Wt 278 lb (126.1 kg)   BMI 38.77 kg/m   Filed Weights   06/20/17 0937  Weight: 278 lb (126.1 kg)    GENERAL: Obese Caucasian male patient. Alert, no distress and comfortable.  Obese. Accompanied by is wife. Walking by himself.  EYES: no pallor or icterus OROPHARYNX: no thrush or  ulceration; good dentition  NECK: supple, no masses felt LYMPH:  no palpable lymphadenopathy in the cervical, axillary or inguinal regions LUNGS: clear to auscultation and  No wheeze or crackles HEART/CVS: regular rate & rhythm and no murmurs; No lower extremity edema ABDOMEN:abdomen soft, non-tender and normal bowel sounds; Positive for colostomy. Incisions were healing. Approximately 2 cm sized ulcer at 4:00 position. Ventral hernia noted. Musculoskeletal:no cyanosis of digits and no clubbing  PSYCH: alert & oriented x 3 with fluent speech. Tearful.  NEURO: no focal motor/sensory deficits SKIN:  no rashes or significant lesions  LABORATORY DATA:  I have reviewed the data as listed    Component Value Date/Time   NA 142 06/20/2017 0919   NA 138 01/02/2015 1555   K 3.0 (L) 06/20/2017 0919   K 3.7 01/02/2015 1555   CL 103 06/20/2017 0919   CL 109 01/02/2015 1555   CO2 29 06/20/2017 0919   CO2 22 01/02/2015 1555   GLUCOSE 114 (H) 06/20/2017 0919   GLUCOSE 110 (H) 01/02/2015 1555   BUN 5 (L) 06/20/2017 0919   BUN 9 01/02/2015 1555   CREATININE 0.93 06/20/2017 0919   CREATININE 1.04 01/02/2015 1555   CALCIUM 8.9 06/20/2017 0919   CALCIUM 8.8 (L) 01/02/2015 1555   PROT 7.4 06/20/2017 0919   PROT 8.2 (H) 01/02/2015 1555   ALBUMIN 3.7 06/20/2017 0919   ALBUMIN 4.0 01/02/2015 1555   AST 37 06/20/2017 0919   AST 39 01/02/2015 1555   ALT 23 06/20/2017 0919   ALT 55 01/02/2015 1555   ALKPHOS 93 06/20/2017 0919   ALKPHOS 125 01/02/2015 1555   BILITOT 0.7 06/20/2017 0919   BILITOT 0.5 01/02/2015 1555   GFRNONAA >60 06/20/2017 0919   GFRNONAA >60 01/02/2015 1555   GFRAA >60 06/20/2017 0919   GFRAA >60 01/02/2015 1555    No results found for: SPEP, UPEP  Lab Results  Component Value Date   WBC 7.0 06/20/2017   NEUTROABS 4.7 06/20/2017   HGB 13.8 06/20/2017   HCT 41.4 06/20/2017   MCV 91.2 06/20/2017   PLT 224 06/20/2017      Chemistry      Component Value Date/Time    NA 142 06/20/2017 0919   NA 138 01/02/2015 1555   K 3.0 (L) 06/20/2017 0919   K 3.7 01/02/2015 1555   CL 103 06/20/2017 0919   CL 109 01/02/2015 1555   CO2 29 06/20/2017 0919   CO2 22 01/02/2015 1555   BUN 5 (L) 06/20/2017 0919   BUN 9 01/02/2015 1555   CREATININE 0.93 06/20/2017 0919   CREATININE 1.04 01/02/2015 1555      Component Value  Date/Time   CALCIUM 8.9 06/20/2017 0919   CALCIUM 8.8 (L) 01/02/2015 1555   ALKPHOS 93 06/20/2017 0919   ALKPHOS 125 01/02/2015 1555   AST 37 06/20/2017 0919   AST 39 01/02/2015 1555   ALT 23 06/20/2017 0919   ALT 55 01/02/2015 1555   BILITOT 0.7 06/20/2017 0919   BILITOT 0.5 01/02/2015 1555     IMPRESSION: 1. Generally worsened malignancy with new and enlarging pulmonary nodules; some stable and some enlarging hepatic metastatic lesions; and variable appearance of the omental/mesenteric deposits of tumor some of which are larger and some of which are smaller than before. 2. A loop of small bowel demonstrates pneumatosis intestinalis, with a cystic component along part of the bowel wall margin. There is also a trace amount of portal venous gas in the adjacent mesentery. Pneumatosis intestinalis can be due to benign/incidental causes, but clearly can also be associated with bowel ischemia/bowel infarct. Worrisome findings include the small amount of portal venous gas and the mild small bowel dilatation in this vicinity. The lack of overt bowel wall thickening is reassuring. If the patient has abdominal pain or of the clinical exam is indeterminate and surgical referral for further assessment may be warranted. 3. Other imaging findings of potential clinical significance: Mild distal esophageal wall thickening, query esophagitis. Diffuse hepatic steatosis. Bilateral nonobstructive nephrolithiasis. Radiology assistant personnel have been notified to put me in telephone contact with the referring physician or the referring physician's  clinical representative in order to discuss these findings. Once this communication is established I will issue an addendum to this report for documentation purposes.  Electronically Signed: By: Van Clines M.D. On: 05/13/2017 11:09  RADIOGRAPHIC STUDIES: I have personally reviewed the radiological images as listed and agreed with the findings in the report. No results found. Results for THEODUS, RAN (MRN [id number]) as of 06/20/2017 09:59  Ref. 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 Currently on vectibex single agent. S/p  cycle #2 today-tolerating with mild to mod-difficulty.   # On vectibex; HOLD sec to "ulcer" [see discussion below].   # left abdomen ulcer- ? Malignant vs- vectibex. HOLD vectibex today. Increase minocycline 100 to BID. Also recommend making appt with [place] [facility].   # Pneumatosis- in the small bowel- likely benign; clinically asymptomatic.   # pain management/chronic pain-STABLE; Continue  fentanyl 75 g every 72 hours and  oxycodone every 6-8 hours. Refilled again.   # Hypokalemia- today 3.0; stable.   # follow up in 2 weeks/labs/ cea;mag. Possible K.; vectibex.   Orders Placed This Encounter  Procedures  . CBC with Differential/Platelet    Standing Status:   Future    Standing Expiration Date:   06/20/2018  . Comprehensive metabolic panel    Standing Status:   Future    Standing Expiration Date:   06/20/2018  . CEA    Standing Status:   Future    Standing Expiration Date:   06/20/2018  . Magnesium    Standing Status:   Future    Standing Expiration Date:    06/20/2018     Cammie Sickle, MD 06/20/2017 10:45 AM

## 2017-06-20 NOTE — Progress Notes (Signed)
Nutrition  Nutrition follow-up planned during infusion today but infusion held.  Will follow-up at later date.    Tasman Zapata B. Zenia Resides, [place], [place] Registered Dietitian [phone] (pager)

## 2017-06-20 NOTE — Assessment & Plan Note (Addendum)
#    Colon cancer with metastases- peritoneal/omental metastases-status post small bowel resection;  AUG 31st CT-Progression- in the liver/omental metastases/lung metastases. Currently on vectibex single agent. S/p  cycle #2 today-tolerating with mild to mod-difficulty.   # On vectibex; HOLD sec to "ulcer" [see discussion below].   # left abdomen ulcer- ? Malignant vs- vectibex. HOLD vectibex today. Increase minocycline 100 to BID. Also recommend making appt with [place] [facility].   # Pneumatosis- in the small bowel- likely benign; clinically asymptomatic.   # pain management/chronic pain-STABLE; Continue  fentanyl 75 g every 72 hours and  oxycodone every 6-8 hours. Refilled again.   # Hypokalemia- today 3.0; stable.   # follow up in 2 weeks/labs/ cea;mag. Possible K.; vectibex.

## 2017-06-20 NOTE — Addendum Note (Signed)
Addended by: Sandria Bales B on: 06/20/2017 12:15 PM   Modules accepted: Orders

## 2017-06-21 LAB — CEA: CEA: 36.5 ng/mL — ABNORMAL HIGH (ref 0.0–4.7)

## 2017-06-24 ENCOUNTER — Telehealth: Payer: Self-pay | Admitting: *Deleted

## 2017-06-24 ENCOUNTER — Inpatient Hospital Stay (HOSPITAL_BASED_OUTPATIENT_CLINIC_OR_DEPARTMENT_OTHER): Payer: BLUE CROSS/BLUE SHIELD | Admitting: Oncology

## 2017-06-24 VITALS — BP 118/68 | Temp 98.1°F | Resp 14 | Wt 281.7 lb

## 2017-06-24 DIAGNOSIS — R918 Other nonspecific abnormal finding of lung field: Secondary | ICD-10-CM | POA: Diagnosis not present

## 2017-06-24 DIAGNOSIS — C772 Secondary and unspecified malignant neoplasm of intra-abdominal lymph nodes: Secondary | ICD-10-CM

## 2017-06-24 DIAGNOSIS — C186 Malignant neoplasm of descending colon: Secondary | ICD-10-CM

## 2017-06-24 DIAGNOSIS — C786 Secondary malignant neoplasm of retroperitoneum and peritoneum: Secondary | ICD-10-CM | POA: Diagnosis not present

## 2017-06-24 DIAGNOSIS — L308 Other specified dermatitis: Secondary | ICD-10-CM

## 2017-06-24 DIAGNOSIS — E876 Hypokalemia: Secondary | ICD-10-CM

## 2017-06-24 DIAGNOSIS — C787 Secondary malignant neoplasm of liver and intrahepatic bile duct: Secondary | ICD-10-CM

## 2017-06-24 DIAGNOSIS — R112 Nausea with vomiting, unspecified: Secondary | ICD-10-CM

## 2017-06-24 DIAGNOSIS — T451X5A Adverse effect of antineoplastic and immunosuppressive drugs, initial encounter: Secondary | ICD-10-CM

## 2017-06-24 DIAGNOSIS — Z79899 Other long term (current) drug therapy: Secondary | ICD-10-CM

## 2017-06-24 MED ORDER — PROMETHAZINE HCL 25 MG PO TABS: 25.0000 mg | ORAL_TABLET | Freq: Four times a day (QID) | ORAL | 6 refills | Status: AC | PRN

## 2017-06-24 MED ORDER — NYSTATIN POWD: 0 refills | Status: DC

## 2017-06-24 NOTE — Telephone Encounter (Addendum)
Patient has had a rash in groin since his last chemotherapy, it is getting worse and the cream and antibiotics given for it at last appointment when chemotherapy held because of it is not helping. She has also tried Desitin A & D ointment. She is asking if he needs to be seen today, if it can be an allergic reaction. Please advise

## 2017-06-24 NOTE — Progress Notes (Signed)
Symptom Management Consult note [facility]  [phone] [phone]  Patient Care Team: [name], MD as PCP - General (Family Medicine) [name], MD as Consulting Physician (General Surgery) Cammie Sickle, MD as Consulting Physician (Oncology) Molli Barrows, MD as Consulting Physician (Pain Medicine) Festus Aloe, MD as Consulting Physician (Urology) Lucilla Lame, MD as Consulting Physician (Gastroenterology) Laurence Spates, MD as Consulting Physician (Gastroenterology)   Name of the patient: [name]  [id number]  [birth year]   Date of visit: 06/24/17  Diagnosis- 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 Specimen integrity: Intact. Macroscopic tumor perforation: Not identified. Invasive tumor: Maximum size: 5.5 cm. Histologic type(s): Invasive adenocarcinoma. Histologic grade and differentiation: G1: well differentiated/low grade  Pathologic Staging: pT4b, pN1b, pM1b . 2.Patient was started on FOLFOX in September of 2016,after 2 cycles of chemotherapy patient developed neuropathy. Patient had a previous neuropathy as a baseline which increased so was switched over to FOLFIRI I IN December of 2016 As patient has clear os wild-type we can proceed to add either a Avastin or cetuximab from the next chemotherapy.(November, 2016)  Avastin was added as abdominal wound has completely healed now Avastin on hold (December, 2016) because of hypertension Chemotherapy was discontinued after protocol 8 cycle because of significant side effect (last chemotherapy was on October 28, 2015).  # DEC 2017- small bowel obstruction s/p resection [Dr.Hoxworth]- positive for  recurrence. 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 ? Neurosarcoidosis   # MOLECULAR STUDIES- MSI stable; K-ras/N-ras/ B-raf- wild-type NOT mutated by [facility] study [april 2016]  Interval history-  Patient presents today for worsening rash between bilateral folds of upper thighs. Patient's wife states this rash began approximately 1 month ago. They have been cleaning the area with soap and water daily and applying cream, baby powder and Desitin without relief. Over the past few days patient's wife noted worsening redness. The patient states it is now burning and painful. The patient states that the rash on his face and around his ostomy is much better since his last visit with [name]. This is no longer a concern per the patient his only other complaint is fatigue and weakness but this is not new. He denies any other complaints.  ECOG FS:1 - Symptomatic but completely ambulatory  Review of systems- Review of Systems  Constitutional: Negative for chills, fever, malaise/fatigue and weight loss.  HENT: Negative.   Eyes: Negative.   Respiratory: Negative.   Cardiovascular: Negative.   Gastrointestinal: Negative.   Genitourinary: Negative.   Musculoskeletal: Negative.   Skin: Positive for rash.  Neurological: Negative.  Negative for weakness.  Endo/Heme/Allergies: Negative.   Psychiatric/Behavioral: Negative.     Current treatment- Palliative chemo- currently on Vecibex.   Allergies  Allergen Reactions  . Versed [Midazolam] Nausea And Vomiting  . Gabapentin Other (See Comments)    Swallowing problems  . Paba Derivatives Nausea And Vomiting    Pt states he is allergic to unknown anesthesia. Pt has  nausea and vomiting  with anesthesia.   . Benzyl Benzoate Nausea And Vomiting    Pt states he is allergic to unknown anesthesia. Pt has nausea and vomiting with anesthesia.      Past Medical History:  Diagnosis Date  . Abdominal pain    mid abdomen  . Adiposity 03/21/2015  . Arthritis   . 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 Colon cancer ([place])   . 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 Extreme obesity 12/21/2012  . GERD (gastroesophageal reflux disease)   . History of kidney stones   . History of transfusion   . Hypertension    has been off BP med x 6 months  . Iron deficiency anemia due to chronic blood loss 05/08/2015  . LBP (low back pain) 05/28/2014  . Neuropathy   . PONV (postoperative nausea and vomiting)   . Sarcoid 10/2012  . Skin cancer    "it flairs up with sarcoidosis"     Past Surgical History:  Procedure Laterality Date  . CARDIAC CATHETERIZATION    . FLEXIBLE SIGMOIDOSCOPY N/A 05/07/2015   Procedure: FLEXIBLE SIGMOIDOSCOPY;  Surgeon: Laurence Spates, MD;  Location: [facility];  Service: Endoscopy;  Laterality: N/A;  . LAPAROSCOPIC PARTIAL COLECTOMY N/A 05/07/2015   Procedure: LAPAROSCOPIC ASSITED PARTIAL COLECTOMY WITH COLOSTOMY, SMALL BOWEL RESECTION, EXCISION OF PERITONEAL NODULE;  Surgeon: [name], MD;  Location: [facility];  Service: General;  Laterality: N/A;  . LAPAROTOMY N/A 08/10/2016   Procedure: EXPLORATORY LAPAROTOMY small bowel resection abdominal wall resection partial omentectomy;  Surgeon: Excell Seltzer, MD;  Location: [facility];  Service: General;  Laterality: N/A;  . PORTACATH PLACEMENT Right 05/27/2015   Procedure: INSERTION PORT-A-CATH;  Surgeon: [name], MD;  Location: [facility];  Service: General;  Laterality: Right;    Social History   Social History  . Marital status: Married    Spouse name: N/A  . Number of children: N/A  . Years of education: N/A   Occupational History  . Not on file.   Social History Main Topics   . Smoking status: Passive Smoke Exposure - Never Smoker  . Smokeless tobacco: Never Used  . Alcohol use No  . Drug use: No  . Sexual activity: Not on file   Other Topics Concern  . Not on file   Social History Narrative  . No narrative on file    Family History  Problem Relation Age of Onset  . Arthritis Mother   . Heart disease Mother   . Stroke Father   . Prostate cancer Neg Hx   . Bladder Cancer Neg Hx      Current Outpatient Prescriptions:  .  clindamycin (CLINDAGEL) 1 % gel, Apply topically daily., Disp: 30 g, Rfl: 1 .  fentaNYL (DURAGESIC - DOSED MCG/HR) 75 MCG/HR, Place 1 patch (75 mcg total) onto the skin every 3 (three) days., Disp: 10 patch, Rfl: 0 .  lidocaine-prilocaine (EMLA) cream, Apply 1 application topically as needed. Apply small amount to port site at least 1 hour prior to it being accessed, cover with plastic wrap, Disp: 30 g, Rfl: 1 .  loperamide (IMODIUM A-D) 2 MG tablet, Take 2 mg by mouth 4 (four) times daily as needed for diarrhea or loose stools., Disp: , Rfl:  .  LORazepam (ATIVAN) 0.5 MG tablet, Take every 8 hours as needed for nausea/anxiety., Disp: 90 tablet, Rfl: 3 .  minocycline (MINOCIN) 100 MG capsule, Every 12 hours, Disp: 60 capsule, Rfl: 1 .  Oxycodone HCl 10 MG TABS, Take 1  tablet (10 mg total) by mouth every 6 (six) hours as needed (severe pain)., Disp: 60 tablet, Rfl: 0 .  prochlorperazine (COMPAZINE) 10 MG tablet, Take 1 tablet (10 mg total) by mouth every 6 (six) hours as needed for nausea or vomiting., Disp: 60 tablet, Rfl: 3 .  sucralfate (CARAFATE) 1 g tablet, Take 1 tablet (1 g total) by mouth 4 (four) times daily., Disp: 120 tablet, Rfl: 1 .  zolpidem (AMBIEN) 10 MG tablet, Take 10-15 mg by mouth at bedtime as needed for sleep., Disp: , Rfl:  .  diphenoxylate-atropine (LOMOTIL) 2.5-0.025 MG tablet, TAKE 1 TABLET BY MOUTH 4 TIMES DAILY AS NEEDED FOR DIARRHEA OR LOOSE STOOLS (Patient not taking: Reported on 06/24/2017), Disp: 30 tablet,  Rfl: 1 .  Nystatin POWD, Apply to bilateral abdominal skin folds TID or as needed. Make sure skin is completely dry., Disp: 2 Bottle, Rfl: 0 .  promethazine (PHENERGAN) 25 MG tablet, Take 1 tablet (25 mg total) by mouth every 6 (six) hours as needed., Disp: 60 tablet, Rfl: 6 No current facility-administered medications for this visit.   Facility-Administered Medications Ordered in Other Visits:  .  sodium chloride flush (NS) 0.9 % injection 10 mL, 10 mL, Intravenous, PRN, Forest Gleason, MD, 10 mL at 02/11/16 1326  Physical exam:  Vitals:   06/24/17 1059  BP: 118/68  Resp: 14  Temp: 98.1 F (36.7 C)  TempSrc: Tympanic  Weight: 281 lb 11.2 oz (127.8 kg)   Physical Exam  Constitutional: He is oriented to person, place, and time and well-developed, well-nourished, and in no distress.  HENT:  Head: Normocephalic and atraumatic.  Neck: Normal range of motion. Neck supple.  Cardiovascular: Normal rate and regular rhythm.   Pulmonary/Chest: Effort normal and breath sounds normal.  Abdominal: Soft. Bowel sounds are normal.  Musculoskeletal: Normal range of motion.  Neurological: He is alert and oriented to person, place, and time.  Skin: Rash noted. There is erythema.        CMP Latest Ref Rng & Units 06/20/2017  Glucose 65 - 99 mg/dL 114(H)  BUN 6 - 20 mg/dL 5(L)  Creatinine 0.61 - 1.24 mg/dL 0.93  Sodium 135 - 145 mmol/L 142  Potassium 3.5 - 5.1 mmol/L 3.0(L)  Chloride 101 - 111 mmol/L 103  CO2 22 - 32 mmol/L 29  Calcium 8.9 - 10.3 mg/dL 8.9  Total Protein 6.5 - 8.1 g/dL 7.4  Total Bilirubin 0.3 - 1.2 mg/dL 0.7  Alkaline Phos 38 - 126 U/L 93  AST 15 - 41 U/L 37  ALT 17 - 63 U/L 23   CBC Latest Ref Rng & Units 06/20/2017  WBC 3.8 - 10.6 K/uL 7.0  Hemoglobin 13.0 - 18.0 g/dL 13.8  Hematocrit 40.0 - 52.0 % 41.4  Platelets 150 - 440 K/uL 224    No images are attached to the encounter.  No results found.   Assessment and plan- Patient is a 56 y.o. male who presents with  worsening rash in the folds of his legs. On examination, the rash is red with several open areas. Yeast noted.   1. Moisture Associated dermatitis/ Yeast infection: RX Nystatin Powder. Applied twice a day as needed to skin folds. Educated the patient on keeping the area very dry and clean really well with soap and water.    Visit Diagnosis 1. Dermatitis associated with moisture     Patient expressed understanding and was in agreement with this plan. He also understands that He can call clinic at any  time with any questions, concerns, or complaints.   Greater than 50% of the 15 minute visit was spent in counseling coordination of care regarding his moisture associated dermatitis/yeast infection.  Marisue Humble [facility] Pager- [phone] 06/27/2017 8:49 AM

## 2017-06-24 NOTE — Telephone Encounter (Signed)
Patient will be here at 1030 to see NP per NP.

## 2017-06-24 NOTE — Telephone Encounter (Signed)
Refill for Phenergan sent in as requested

## 2017-06-30 ENCOUNTER — Telehealth: Payer: Self-pay | Admitting: Oncology

## 2017-06-30 ENCOUNTER — Telehealth: Payer: Self-pay | Admitting: *Deleted

## 2017-06-30 NOTE — Telephone Encounter (Signed)
See note

## 2017-06-30 NOTE — Telephone Encounter (Signed)
If Nystain Powder is helping, lets just continue with this for now. He will be seen next week and we can re-assess then. I will make sure Dr. B is ok with this plan. Patient needs to remember to keep this area completely dry and clean really well.

## 2017-06-30 NOTE — Telephone Encounter (Signed)
Per Lorretta Harp, NP no changes in treatment at this time, discuss with Dr B at his visit next week. If gets worse, call back. Call returned to [facility] and informed of doctor decision she states she will continue what she is doing

## 2017-06-30 NOTE — Telephone Encounter (Signed)
[name] called to report that the yeast in the groin and under the colostomy has not cleared yet, it has improved though. Concerned that the chemotherapy will make it worse and wants to discuss changing therapy. Also asking if there is anything else he can use for the yeast. Please advise

## 2017-07-04 ENCOUNTER — Inpatient Hospital Stay (HOSPITAL_BASED_OUTPATIENT_CLINIC_OR_DEPARTMENT_OTHER): Payer: BLUE CROSS/BLUE SHIELD | Admitting: Internal Medicine

## 2017-07-04 ENCOUNTER — Inpatient Hospital Stay: Payer: BLUE CROSS/BLUE SHIELD

## 2017-07-04 ENCOUNTER — Telehealth: Payer: Self-pay | Admitting: Pharmacist

## 2017-07-04 VITALS — BP 108/74 | HR 106 | Resp 14

## 2017-07-04 DIAGNOSIS — C186 Malignant neoplasm of descending colon: Secondary | ICD-10-CM | POA: Diagnosis not present

## 2017-07-04 DIAGNOSIS — C7800 Secondary malignant neoplasm of unspecified lung: Secondary | ICD-10-CM

## 2017-07-04 DIAGNOSIS — C786 Secondary malignant neoplasm of retroperitoneum and peritoneum: Secondary | ICD-10-CM

## 2017-07-04 DIAGNOSIS — E876 Hypokalemia: Secondary | ICD-10-CM | POA: Diagnosis not present

## 2017-07-04 DIAGNOSIS — L308 Other specified dermatitis: Secondary | ICD-10-CM

## 2017-07-04 DIAGNOSIS — C772 Secondary and unspecified malignant neoplasm of intra-abdominal lymph nodes: Principal | ICD-10-CM

## 2017-07-04 DIAGNOSIS — C787 Secondary malignant neoplasm of liver and intrahepatic bile duct: Secondary | ICD-10-CM | POA: Diagnosis not present

## 2017-07-04 DIAGNOSIS — E860 Dehydration: Secondary | ICD-10-CM

## 2017-07-04 DIAGNOSIS — Z79899 Other long term (current) drug therapy: Secondary | ICD-10-CM

## 2017-07-04 DIAGNOSIS — G893 Neoplasm related pain (acute) (chronic): Secondary | ICD-10-CM

## 2017-07-04 DIAGNOSIS — R112 Nausea with vomiting, unspecified: Secondary | ICD-10-CM

## 2017-07-04 LAB — CBC WITH DIFFERENTIAL/PLATELET
BASOS ABS: 0.1 10*3/uL (ref 0–0.1)
Basophils Relative: 1 %
EOS ABS: 0.1 10*3/uL (ref 0–0.7)
EOS PCT: 1 %
HCT: 41.4 % (ref 40.0–52.0)
Hemoglobin: 13.8 g/dL (ref 13.0–18.0)
LYMPHS ABS: 1.1 10*3/uL (ref 1.0–3.6)
LYMPHS PCT: 17 %
MCH: 30.7 pg (ref 26.0–34.0)
MCHC: 33.3 g/dL (ref 32.0–36.0)
MCV: 92.2 fL (ref 80.0–100.0)
MONO ABS: 0.6 10*3/uL (ref 0.2–1.0)
Monocytes Relative: 9 %
Neutro Abs: 4.3 10*3/uL (ref 1.4–6.5)
Neutrophils Relative %: 72 %
PLATELETS: 183 10*3/uL (ref 150–440)
RBC: 4.49 MIL/uL (ref 4.40–5.90)
RDW: 15.4 % — AB (ref 11.5–14.5)
WBC: 6.1 10*3/uL (ref 3.8–10.6)

## 2017-07-04 LAB — COMPREHENSIVE METABOLIC PANEL
ALBUMIN: 3.4 g/dL — AB (ref 3.5–5.0)
ALT: 16 U/L — ABNORMAL LOW (ref 17–63)
ANION GAP: 9 (ref 5–15)
AST: 31 U/L (ref 15–41)
Alkaline Phosphatase: 97 U/L (ref 38–126)
BILIRUBIN TOTAL: 1 mg/dL (ref 0.3–1.2)
BUN: 6 mg/dL (ref 6–20)
CHLORIDE: 104 mmol/L (ref 101–111)
CO2: 25 mmol/L (ref 22–32)
Calcium: 8.8 mg/dL — ABNORMAL LOW (ref 8.9–10.3)
Creatinine, Ser: 0.93 mg/dL (ref 0.61–1.24)
GFR calc Af Amer: >60 mL/min (ref >60)
GFR calc non Af Amer: >60 mL/min (ref >60)
GLUCOSE: 131 mg/dL — AB (ref 65–99)
POTASSIUM: 2.8 mmol/L — AB (ref 3.5–5.1)
Sodium: 138 mmol/L (ref 135–145)
TOTAL PROTEIN: 7.2 g/dL (ref 6.5–8.1)

## 2017-07-04 LAB — MAGNESIUM: MAGNESIUM: 1.6 mg/dL — AB (ref 1.7–2.4)

## 2017-07-04 MED ORDER — OXYCODONE HCL 5 MG PO TABS
10.0000 mg | ORAL_TABLET | Freq: Once | ORAL | Status: AC
  Administered 2017-07-04: 5 mg via ORAL
  Filled 2017-07-04: qty 2

## 2017-07-04 MED ORDER — FENTANYL 75 MCG/HR TD PT72: 75.0000 ug | MEDICATED_PATCH | TRANSDERMAL | 0 refills | Status: DC

## 2017-07-04 MED ORDER — OXYCODONE HCL 5 MG PO TABS
10.0000 mg | ORAL_TABLET | Freq: Once | ORAL | Status: AC
  Administered 2017-07-04: 5 mg via ORAL

## 2017-07-04 MED ORDER — TRIFLURIDINE-TIPIRACIL 20-8.19 MG PO TABS: ORAL_TABLET | ORAL | 4 refills | Status: DC

## 2017-07-04 MED ORDER — OXYCODONE HCL 10 MG PO TABS: 10.0000 mg | ORAL_TABLET | Freq: Four times a day (QID) | ORAL | 0 refills | Status: DC | PRN

## 2017-07-04 MED ORDER — SODIUM CHLORIDE 0.9 % IV SOLN
Freq: Once | INTRAVENOUS | Status: AC
  Administered 2017-07-04: 12:00:00 via INTRAVENOUS
  Filled 2017-07-04: qty 20

## 2017-07-04 MED ORDER — NYSTATIN POWD: 0 refills | Status: DC

## 2017-07-04 MED ORDER — HEPARIN SOD (PORK) LOCK FLUSH 100 UNIT/ML IV SOLN
500.0000 [IU] | Freq: Once | INTRAVENOUS | Status: AC
  Administered 2017-07-04: 500 [IU] via INTRAVENOUS
  Filled 2017-07-04: qty 5

## 2017-07-04 MED ORDER — SODIUM CHLORIDE 0.9% FLUSH
10.0000 mL | Freq: Once | INTRAVENOUS | Status: AC
  Administered 2017-07-04: 10 mL via INTRAVENOUS
  Filled 2017-07-04: qty 10

## 2017-07-04 MED ORDER — TRIFLURIDINE-TIPIRACIL 20-8.19 MG PO TABS: 80.0000 mg | ORAL_TABLET | Freq: Two times a day (BID) | ORAL | 4 refills | Status: DC

## 2017-07-04 MED ORDER — TERBINAFINE HCL 250 MG PO TABS: 250.0000 mg | ORAL_TABLET | Freq: Every day | ORAL | 0 refills | Status: DC

## 2017-07-04 NOTE — Telephone Encounter (Addendum)
Oral Oncology Pharmacist Encounter  Received new prescription for Lonsurf (Trifluridine/Tipiracil) for the treatment of metastatic colon cancer, planned duration until disease progression or unacceptable drug toxicity.  CBC/CMP from 07/04/17 assessed, no relevant lab abnormalities. Prescription dose and frequency assessed.   Current medication list in Epic reviewed, no DDIs with Lonsurf identified.  Prescription has been e-scribed to [facility]. Due to insurance restriction the medication can not be filled at [facility]. Supportive information faxed to [facility].   Patient education Counseled patient and his wife following their OV on the administration, dosing, side effects, monitoring, drug-food interactions, safe handling, storage, and disposal. Patient will take 4 tablets (80 mg of trifluridine total) by mouth 2 (two) times daily after a meal. 1 hr after AM & PM meals on days 1-5, 8-12. Repeat every 28day.  Side effects include but not limited to: fatigue, N/V/D, abdominal pain.    Reviewed with patient importance of keeping a medication schedule and plan for any missed doses.  Mr. Higinbotham and his wife voiced understanding and appreciation. All questions answered.  Oral Oncology Clinic will continue to follow for insurance authorization, copayment issues, initial counseling and start date.  Provided patient with [facility] business cards. Patient knows to call the office with questions or concerns. Oral Chemotherapy Navigation Clinic will continue to follow.  Darl Pikes, PharmD, BCPS Hematology/Oncology Clinical Pharmacist [facility] [phone]  07/04/2017 11:51 AM

## 2017-07-04 NOTE — Progress Notes (Signed)
Patient sleeping.  No s/s distress noted

## 2017-07-04 NOTE — Progress Notes (Signed)
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 Specimen integrity: Intact. Macroscopic tumor perforation: Not identified. Invasive tumor: Maximum size: 5.5 cm. Histologic type(s): Invasive adenocarcinoma. Histologic grade and differentiation: G1: well differentiated/low grade  Pathologic Staging: pT4b, pN1b, pM1b . 2.Patient was started on FOLFOX in September of 2016,after 2 cycles of chemotherapy patient developed neuropathy. Patient had a previous neuropathy as a baseline which increased so was switched over to FOLFIRI I IN December of 2016 As patient has clear os wild-type we can proceed to add either a Avastin or cetuximab from the next chemotherapy.(November, 2016)  Avastin was added as abdominal wound has completely healed now Avastin on hold (December, 2016) because of hypertension Chemotherapy was discontinued after protocol 8 cycle because of significant side effect (last chemotherapy was on October 28, 2015).  # DEC 2017- small bowel obstruction s/p resection [Dr.Hoxworth]- positive for recurrence. JAN 30th PET- liver mets/  lung/omental-peritoneal mets  # FEB 5th 2018-  FOLFIRI + vectibex [vec x1 stopped sec to diarrhea]; May 31st CT- improved; then May 23rd 2018- 5FU+avastin- CT AUG 31st 2018- Progression; SEP 2018- Vectibex single agent; STOPPED end of sep 2018 [intol];   # NOV 1st week- LONSURF  # Hx of Bil Kidney stones  # sarcoidosis/ Lung- [[facility]]- on surveillance  # Neuropathy- lyrica -off. ? 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  male  with above history of recurrent metastatic colon cancer to the liver/omentum on palliative chemo- currently on Vectibex Is here for follow-up. He is currently status post 2 cycles of single agent vectibex. His last treatment was approximately 4 weeks ago. Treatment was on hold because of his multiple side effects.  Patient continues to complain of worsening rash in the inguinal region bilaterally. It has been itching and hurting. Patient was evaluated for this and started on nystatin topical approximately week ago.  His facial rash  is improved. He continues minocycline.   Patient notes to have ulcerated lesion next to the ostomy- started to develop 2 weeks ago. However this lesion continues to be getting worse.  this has been evaluated by the ostomy nurse.   He notes to have intermittent worsening abdominal pain; however not significantly worse from his baseline.  Denies any significant nausea or vomiting. Overall he feels poorly. Denies any diarrhea.  REVIEW OF SYSTEMS:  A complete 10 point review of system is done which is negative except mentioned above/history of present illness.   PAST MEDICAL HISTORY :  Past Medical History:  Diagnosis Date  . Abdominal pain  mid abdomen  . Adiposity 03/21/2015  . Arthritis   . 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  Colon cancer ([place])   . 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 Extreme obesity 12/21/2012  . GERD (gastroesophageal reflux disease)   . History of kidney stones   . History of transfusion   . Hypertension    has been off BP med x 6 months  . Iron deficiency anemia due to chronic blood loss 05/08/2015  . LBP (low back pain) 05/28/2014  . Neuropathy   . PONV (postoperative nausea and vomiting)   . Sarcoid 10/2012  . Skin cancer    "it flairs up with sarcoidosis"    PAST SURGICAL HISTORY :   Past Surgical History:  Procedure Laterality Date  . CARDIAC CATHETERIZATION    . FLEXIBLE SIGMOIDOSCOPY N/A 05/07/2015   Procedure: FLEXIBLE SIGMOIDOSCOPY;  Surgeon: Laurence Spates, MD;  Location: [facility];  Service: Endoscopy;  Laterality: N/A;  . LAPAROSCOPIC PARTIAL COLECTOMY N/A 05/07/2015   Procedure: LAPAROSCOPIC ASSITED PARTIAL COLECTOMY WITH COLOSTOMY, SMALL BOWEL RESECTION, EXCISION OF PERITONEAL NODULE;  Surgeon: [name], MD;  Location: [facility];  Service: General;  Laterality: N/A;  . LAPAROTOMY N/A 08/10/2016   Procedure: EXPLORATORY LAPAROTOMY small bowel resection abdominal wall resection partial omentectomy;  Surgeon: Excell Seltzer, MD;  Location: [facility];  Service: General;  Laterality: N/A;  . PORTACATH PLACEMENT Right 05/27/2015   Procedure: INSERTION PORT-A-CATH;  Surgeon: [name], MD;  Location: [facility];  Service: General;  Laterality: Right;    FAMILY HISTORY :   Family History  Problem Relation Age of Onset  . Arthritis Mother   . Heart disease Mother   . Stroke Father   . Prostate cancer Neg Hx   . Bladder Cancer Neg Hx     SOCIAL HISTORY:   Social History  Substance Use Topics  . Smoking status: Passive Smoke Exposure - Never Smoker  . Smokeless tobacco: Never Used  . Alcohol use No    ALLERGIES:  is allergic to versed [midazolam]; gabapentin; paba derivatives; and benzyl benzoate.  MEDICATIONS:  Current Outpatient Prescriptions   Medication Sig Dispense Refill  . clindamycin (CLINDAGEL) 1 % gel Apply topically daily. 30 g 1  . diphenoxylate-atropine (LOMOTIL) 2.5-0.025 MG tablet TAKE 1 TABLET BY MOUTH 4 TIMES DAILY AS NEEDED FOR DIARRHEA OR LOOSE STOOLS 30 tablet 1  . fentaNYL (DURAGESIC - DOSED MCG/HR) 75 MCG/HR Place 1 patch (75 mcg total) onto the skin every 3 (three) days. 10 patch 0  . lidocaine-prilocaine (EMLA) cream Apply 1 application topically as needed. Apply small amount to port site at least 1 hour prior to it being accessed, cover with plastic wrap 30 g 1  . loperamide (IMODIUM A-D) 2 MG tablet Take 2 mg by mouth 4 (four) times daily as needed for diarrhea or loose stools.    Marland Kitchen LORazepam (ATIVAN) 0.5 MG tablet Take every 8 hours as needed for nausea/anxiety. 90 tablet 3  . minocycline (MINOCIN) 100 MG capsule Every 12 hours 60 capsule 1  . Nystatin POWD Apply to bilateral abdominal skin folds TID or as needed. Make sure skin is completely dry. 2 Bottle 0  . Oxycodone HCl 10 MG TABS Take 1 tablet (10 mg total) by mouth every 6 (six) hours as needed (severe pain). 60 tablet 0  . prochlorperazine (COMPAZINE) 10 MG tablet Take 1 tablet (10 mg total) by mouth every 6 (six) hours as needed for nausea or vomiting. 60 tablet 3  .  promethazine (PHENERGAN) 25 MG tablet Take 1 tablet (25 mg total) by mouth every 6 (six) hours as needed. 60 tablet 6  . sucralfate (CARAFATE) 1 g tablet Take 1 tablet (1 g total) by mouth 4 (four) times daily. 120 tablet 1  . zolpidem (AMBIEN) 10 MG tablet Take 10-15 mg by mouth at bedtime as needed for sleep.    Marland Kitchen terbinafine (LAMISIL) 250 MG tablet Take 1 tablet (250 mg total) by mouth daily. 14 tablet 0  . trifluridine-tipiracil (LONSURF) 20-8.19 MG tablet Take 4 tablets (80 mg trifluridine) by mouth two times daily after a meal. 1 hr after AM & PM meals on days 1-5, 8-12. Repeat every 28day 80 tablet 4   No current facility-administered medications for this visit.     Facility-Administered Medications Ordered in Other Visits  Medication Dose Route Frequency Provider Last Rate Last Dose  . sodium chloride flush (NS) 0.9 % injection 10 mL  10 mL Intravenous PRN Choksi, Delorise Shiner, MD   10 mL at 02/11/16 1326    PHYSICAL EXAMINATION: ECOG PERFORMANCE STATUS: 0 - Asymptomatic  BP 108/74 (BP Location: Left Arm, Patient Position: Sitting)   Pulse (!) 106   Resp 14   There were no vitals filed for this visit.  GENERAL: Obese Caucasian male patient. Alert, no distress and comfortable.  Obese. Accompanied by is wife. Walking by himself.  EYES: no pallor or icterus OROPHARYNX: no thrush or ulceration; good dentition  NECK: supple, no masses felt LYMPH:  no palpable lymphadenopathy in the cervical, axillary or inguinal regions LUNGS: clear to auscultation and  No wheeze or crackles HEART/CVS: regular rate & rhythm and no murmurs; No lower extremity edema ABDOMEN:abdomen soft, non-tender and normal bowel sounds; Positive for colostomy. Incisions were healing. Approximately 2 cm sized ulcer at 4:00 position. Ventral hernia noted. Musculoskeletal:no cyanosis of digits and no clubbing  PSYCH: alert & oriented x 3 with fluent speech. Tearful.  NEURO: no focal motor/sensory deficits SKIN:  Patient noted to have diffuse macerated/skin rash in the groin bilaterally extending onto the scrotum.   LABORATORY DATA:  I have reviewed the data as listed    Component Value Date/Time   NA 138 07/04/2017 0932   NA 138 01/02/2015 1555   K 2.8 (L) 07/04/2017 0932   K 3.7 01/02/2015 1555   CL 104 07/04/2017 0932   CL 109 01/02/2015 1555   CO2 25 07/04/2017 0932   CO2 22 01/02/2015 1555   GLUCOSE 131 (H) 07/04/2017 0932   GLUCOSE 110 (H) 01/02/2015 1555   BUN 6 07/04/2017 0932   BUN 9 01/02/2015 1555   CREATININE 0.93 07/04/2017 0932   CREATININE 1.04 01/02/2015 1555   CALCIUM 8.8 (L) 07/04/2017 0932   CALCIUM 8.8 (L) 01/02/2015 1555   PROT 7.2 07/04/2017 0932    PROT 8.2 (H) 01/02/2015 1555   ALBUMIN 3.4 (L) 07/04/2017 0932   ALBUMIN 4.0 01/02/2015 1555   AST 31 07/04/2017 0932   AST 39 01/02/2015 1555   ALT 16 (L) 07/04/2017 0932   ALT 55 01/02/2015 1555   ALKPHOS 97 07/04/2017 0932   ALKPHOS 125 01/02/2015 1555   BILITOT 1.0 07/04/2017 0932   BILITOT 0.5 01/02/2015 1555   GFRNONAA >60 07/04/2017 0932   GFRNONAA >60 01/02/2015 1555   GFRAA >60 07/04/2017 0932   GFRAA >60 01/02/2015 1555    No results found for: SPEP, UPEP  Lab Results  Component Value Date   WBC 6.1 07/04/2017   NEUTROABS 4.3 07/04/2017  HGB 13.8 07/04/2017   HCT 41.4 07/04/2017   MCV 92.2 07/04/2017   PLT 183 07/04/2017      Chemistry      Component Value Date/Time   NA 138 07/04/2017 0932   NA 138 01/02/2015 1555   K 2.8 (L) 07/04/2017 0932   K 3.7 01/02/2015 1555   CL 104 07/04/2017 0932   CL 109 01/02/2015 1555   CO2 25 07/04/2017 0932   CO2 22 01/02/2015 1555   BUN 6 07/04/2017 0932   BUN 9 01/02/2015 1555   CREATININE 0.93 07/04/2017 0932   CREATININE 1.04 01/02/2015 1555      Component Value Date/Time   CALCIUM 8.8 (L) 07/04/2017 0932   CALCIUM 8.8 (L) 01/02/2015 1555   ALKPHOS 97 07/04/2017 0932   ALKPHOS 125 01/02/2015 1555   AST 31 07/04/2017 0932   AST 39 01/02/2015 1555   ALT 16 (L) 07/04/2017 0932   ALT 55 01/02/2015 1555   BILITOT 1.0 07/04/2017 0932   BILITOT 0.5 01/02/2015 1555     IMPRESSION: 1. Generally worsened malignancy with new and enlarging pulmonary nodules; some stable and some enlarging hepatic metastatic lesions; and variable appearance of the omental/mesenteric deposits of tumor some of which are larger and some of which are smaller than before. 2. A loop of small bowel demonstrates pneumatosis intestinalis, with a cystic component along part of the bowel wall margin. There is also a trace amount of portal venous gas in the adjacent mesentery. Pneumatosis intestinalis can be due to benign/incidental causes,  but clearly can also be associated with bowel ischemia/bowel infarct. Worrisome findings include the small amount of portal venous gas and the mild small bowel dilatation in this vicinity. The lack of overt bowel wall thickening is reassuring. If the patient has abdominal pain or of the clinical exam is indeterminate and surgical referral for further assessment may be warranted. 3. Other imaging findings of potential clinical significance: Mild distal esophageal wall thickening, query esophagitis. Diffuse hepatic steatosis. Bilateral nonobstructive nephrolithiasis. Radiology assistant personnel have been notified to put me in telephone contact with the referring physician or the referring physician's clinical representative in order to discuss these findings. Once this communication is established I will issue an addendum to this report for documentation purposes.  Electronically Signed: By: Van Clines M.D. On: 05/13/2017 11:09  RADIOGRAPHIC STUDIES: I have personally reviewed the radiological images as listed and agreed with the findings in the report. No results found. Results for KHALFANI, WEIDEMAN (MRN [id number]) as of 06/20/2017 09:59  Ref. 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 Currently on vectibex single agent. S/p  cycle #2 today-tolerating with  mod-severe difficulty with severe skin rash/severe hypokalemia [see discussion below]  # On vectibex; HOLD sec to "ulcer" [see discussion below]. Discontinue vectibex- secondary to poor tolerance. Plan starting lonsurf. Discussed the potential  side effects including but not limited to neutropenia nausea vomiting diarrhea and mucositis.   # left abdomen ulcer- ?Pyoderma gangrenosum vs  Malignant vs- vectibex. Discontinue Vectibex. Discussed with ostomy nurse.   # Bilateral groin skin rash- dermatitis from vectibex/superinfection fungal- recommend terbinafine 250 mg once a day  for 14 days. He'll also continue nystatin topical. Prescriptions provided.   # pain management/chronic pain-STABLE; Continue  fentanyl 75 g every 72 hours and  oxycodone every 6-8 hours. Refilled again.   # Hypokalemia- today 2.8; noncompliance with oral supplementation. Recommend IV supplementation today.   # follow up in 2 weeks/labs/ cea;mag. Possible K.  No orders of the defined types were placed in this encounter.    Cammie Sickle, MD 07/05/2017 10:40 AM

## 2017-07-04 NOTE — Progress Notes (Signed)
10mg  oxycodone given at this time

## 2017-07-04 NOTE — Progress Notes (Signed)
Nutrition Follow-up:  Patient with recurrent metastatic colon cancer to liver/omentum on palliative treatment.  Planning new treatment of lonsurf.  Wife reports patient appetite has been down a little bit as patient dealing with rash in groin area and new ulcerated lesion next to the ostomy.  Seen by wound nurse today in clinic per wife.    No changes in ostomy output per patient.   Medications: reviewed  Labs: reviewed  Anthropometrics:   Weight increased to 281 lb 11.2 oz on 10/12   NUTRITION DIAGNOSIS: Unintentional weight loss improved   MALNUTRITION DIAGNOSIS: Moderate malnutrition improved   INTERVENTION:   Encouraged good sources of protein for wound healing. Examples discussed with wife. Patient and wife have contact information and will contact me with questions    MONITORING, EVALUATION, GOAL: weight trends, intake   NEXT VISIT: as needed  Trellis Vanoverbeke B. Zenia Resides, [place], [place] Registered Dietitian [phone] (pager)

## 2017-07-04 NOTE — Assessment & Plan Note (Addendum)
#    Colon cancer with metastases- peritoneal/omental metastases-status post small bowel resection;  AUG 31st CT-Progression- in the liver/omental metastases/lung metastases. Currently on vectibex single agent. S/p  cycle #2 today-tolerating with  mod-severe difficulty with severe skin rash/severe hypokalemia [see discussion below]  # On vectibex; HOLD sec to "ulcer" [see discussion below]. Discontinue vectibex- secondary to poor tolerance. Plan starting lonsurf. Discussed the potential side effects including but not limited to neutropenia nausea vomiting diarrhea and mucositis.   # left abdomen ulcer- ?Pyoderma gangrenosum vs  Malignant vs- vectibex. Discontinue Vectibex. Discussed with ostomy nurse.   # Bilateral groin skin rash- dermatitis from vectibex/superinfection fungal- recommend terbinafine 250 mg once a day for 14 days. He'll also continue nystatin topical. Prescriptions provided.   # pain management/chronic pain-STABLE; Continue  fentanyl 75 g every 72 hours and  oxycodone every 6-8 hours. Refilled again.   # Hypokalemia- today 2.8; noncompliance with oral supplementation. Recommend IV supplementation today.   # follow up in 2 weeks/labs/ cea;mag. Possible K.

## 2017-07-05 LAB — CEA: CEA: 48.1 ng/mL — ABNORMAL HIGH (ref 0.0–4.7)

## 2017-07-07 NOTE — Telephone Encounter (Signed)
Oral Chemotherapy Pharmacist Encounter  Faxed copy of insurance card to [facility].  Darl Pikes, PharmD, BCPS Hematology/Oncology Clinical Pharmacist [facility] [phone]  07/07/2017 11:21 AM

## 2017-07-07 NOTE — Telephone Encounter (Signed)
Oral Chemotherapy Pharmacist Encounter  Received notification from [facility] that PA was completed for Mr. [name] but that his insurance company is requiring that the prescription be fill by [facility].  Will follow-up with [facility] regarding the prescription status.   Called patient to let him know his prescription had been transferred.   Darl Pikes, PharmD, BCPS Hematology/Oncology Clinical Pharmacist [facility] [phone]  07/07/2017 10:55 AM

## 2017-07-11 NOTE — Telephone Encounter (Addendum)
Oral Oncology Patient Advocate Encounter   [facility] called to verify prescription. They will contact patient and ship out.  Per [place] [phone]  Will follow-up.   [place] Patient Advocate [phone] 07/11/2017 2:25 PM

## 2017-07-11 NOTE — Telephone Encounter (Signed)
Oral Oncology Patient Advocate Encounter  Called patient to see if he had gotten his Lonsurf yet. Spoke with wife and she said no.   I called [facility] they will call patient Tuesday 07/12/2017 to set up delivery.  I called wife back and told her they should be contacting her tomorrow. If they do not call, please call me.     [place] Patient Advocate [phone] 07/11/2017 12:12 PM

## 2017-07-13 ENCOUNTER — Telehealth: Payer: Self-pay | Admitting: *Deleted

## 2017-07-13 ENCOUNTER — Telehealth: Payer: Self-pay | Admitting: Pharmacist

## 2017-07-13 DIAGNOSIS — C186 Malignant neoplasm of descending colon: Secondary | ICD-10-CM

## 2017-07-13 DIAGNOSIS — C772 Secondary and unspecified malignant neoplasm of intra-abdominal lymph nodes: Principal | ICD-10-CM

## 2017-07-13 MED ORDER — PREDNISONE 1 MG PO TABS: ORAL_TABLET | ORAL | 1 refills | Status: AC

## 2017-07-13 NOTE — Telephone Encounter (Signed)
Phone call returned to patient and he said Roxanne told him to take the message if I called. He was advised that prescription has been sent to pharmacy and that he should just bring his chemotherapy pills with him to appointment Monday. He repeated back to me

## 2017-07-13 NOTE — Telephone Encounter (Signed)
Wound nurse seeing [name] is asking for order for Prednisone 1 mg to be crushed and put on wound at stoma for healing purposes. Please advise

## 2017-07-13 NOTE — Telephone Encounter (Signed)
Oral Oncology Patient Advocate Encounter  Spoke with [facility] patients [name] was mailed out on 07/11/2017.  [place] Patient Advocate [phone] 07/13/2017 10:23 AM

## 2017-07-13 NOTE — Telephone Encounter (Signed)
Oral Chemotherapy Pharmacist Encounter  Received call from Mrs. Meulemans letting us know that they received his medication in the mail yesterday. They plan to get started on the Lonsurf on Monday 07/18/17.  Reviewed administration instructions with Mrs. Daffin. She know to call the office with any concerns.  Thank you,  Darl Pikes, PharmD, BCPS Hematology/Oncology Clinical Pharmacist [facility] [phone]  07/13/2017 11:05 AM

## 2017-07-13 NOTE — Telephone Encounter (Signed)
Spoke with [name] - md would like pt to remain on schedule. Will not be giving chemotherapy but may need IV fluids/posible IV mag

## 2017-07-13 NOTE — Telephone Encounter (Signed)
Scruggs, Hulen Luster, MD  Cc: Sabino Gasser, RN  Phone Number: [phone]        Has 11-5 appt for lab/md/inf is taking Chemo pill Monday. Wife asking if we need appt? thx

## 2017-07-14 NOTE — Telephone Encounter (Signed)
Oral Oncology Patient Advocate Encounter  Called [facility] patient has a $0.00 copay for his Lonsurf.   [place] Patient Advocate [phone] 07/14/2017 11:19 AM

## 2017-07-18 ENCOUNTER — Inpatient Hospital Stay (HOSPITAL_BASED_OUTPATIENT_CLINIC_OR_DEPARTMENT_OTHER): Payer: BLUE CROSS/BLUE SHIELD | Admitting: Internal Medicine

## 2017-07-18 ENCOUNTER — Inpatient Hospital Stay: Payer: BLUE CROSS/BLUE SHIELD

## 2017-07-18 ENCOUNTER — Inpatient Hospital Stay: Payer: BLUE CROSS/BLUE SHIELD | Attending: Internal Medicine

## 2017-07-18 VITALS — BP 118/80 | HR 80 | Temp 98.0°F | Resp 20 | Ht 71.0 in | Wt 277.4 lb

## 2017-07-18 DIAGNOSIS — C772 Secondary and unspecified malignant neoplasm of intra-abdominal lymph nodes: Secondary | ICD-10-CM | POA: Diagnosis not present

## 2017-07-18 DIAGNOSIS — C186 Malignant neoplasm of descending colon: Secondary | ICD-10-CM | POA: Insufficient documentation

## 2017-07-18 DIAGNOSIS — G8929 Other chronic pain: Secondary | ICD-10-CM

## 2017-07-18 DIAGNOSIS — Z7722 Contact with and (suspected) exposure to environmental tobacco smoke (acute) (chronic): Secondary | ICD-10-CM | POA: Insufficient documentation

## 2017-07-18 DIAGNOSIS — K219 Gastro-esophageal reflux disease without esophagitis: Secondary | ICD-10-CM | POA: Diagnosis not present

## 2017-07-18 DIAGNOSIS — Z9114 Patient's other noncompliance with medication regimen: Secondary | ICD-10-CM | POA: Insufficient documentation

## 2017-07-18 DIAGNOSIS — E669 Obesity, unspecified: Secondary | ICD-10-CM | POA: Diagnosis not present

## 2017-07-18 DIAGNOSIS — E876 Hypokalemia: Secondary | ICD-10-CM | POA: Diagnosis not present

## 2017-07-18 DIAGNOSIS — I10 Essential (primary) hypertension: Secondary | ICD-10-CM | POA: Diagnosis not present

## 2017-07-18 DIAGNOSIS — K760 Fatty (change of) liver, not elsewhere classified: Secondary | ICD-10-CM | POA: Diagnosis not present

## 2017-07-18 DIAGNOSIS — C786 Secondary malignant neoplasm of retroperitoneum and peritoneum: Secondary | ICD-10-CM

## 2017-07-18 DIAGNOSIS — Z9119 Patient's noncompliance with other medical treatment and regimen: Secondary | ICD-10-CM | POA: Insufficient documentation

## 2017-07-18 DIAGNOSIS — F419 Anxiety disorder, unspecified: Secondary | ICD-10-CM | POA: Insufficient documentation

## 2017-07-18 DIAGNOSIS — C787 Secondary malignant neoplasm of liver and intrahepatic bile duct: Secondary | ICD-10-CM

## 2017-07-18 DIAGNOSIS — L309 Dermatitis, unspecified: Secondary | ICD-10-CM | POA: Diagnosis not present

## 2017-07-18 DIAGNOSIS — D869 Sarcoidosis, unspecified: Secondary | ICD-10-CM | POA: Insufficient documentation

## 2017-07-18 DIAGNOSIS — Z87442 Personal history of urinary calculi: Secondary | ICD-10-CM | POA: Insufficient documentation

## 2017-07-18 DIAGNOSIS — Z85828 Personal history of other malignant neoplasm of skin: Secondary | ICD-10-CM | POA: Insufficient documentation

## 2017-07-18 DIAGNOSIS — C7800 Secondary malignant neoplasm of unspecified lung: Secondary | ICD-10-CM

## 2017-07-18 DIAGNOSIS — Z79899 Other long term (current) drug therapy: Secondary | ICD-10-CM

## 2017-07-18 LAB — CBC WITH DIFFERENTIAL/PLATELET
BASOS PCT: 1 %
Basophils Absolute: 0 10*3/uL (ref 0–0.1)
EOS ABS: 0 10*3/uL (ref 0–0.7)
Eosinophils Relative: 0 %
HEMATOCRIT: 40.6 % (ref 40.0–52.0)
HEMOGLOBIN: 13.8 g/dL (ref 13.0–18.0)
Lymphocytes Relative: 11 %
Lymphs Abs: 0.8 10*3/uL — ABNORMAL LOW (ref 1.0–3.6)
MCH: 30.8 pg (ref 26.0–34.0)
MCHC: 34 g/dL (ref 32.0–36.0)
MCV: 90.8 fL (ref 80.0–100.0)
MONOS PCT: 9 %
Monocytes Absolute: 0.7 10*3/uL (ref 0.2–1.0)
NEUTROS ABS: 6.2 10*3/uL (ref 1.4–6.5)
NEUTROS PCT: 79 %
Platelets: 206 10*3/uL (ref 150–440)
RBC: 4.47 MIL/uL (ref 4.40–5.90)
RDW: 15.8 % — ABNORMAL HIGH (ref 11.5–14.5)
WBC: 7.7 10*3/uL (ref 3.8–10.6)

## 2017-07-18 LAB — BASIC METABOLIC PANEL
ANION GAP: 10 (ref 5–15)
BUN: 6 mg/dL (ref 6–20)
CALCIUM: 8.5 mg/dL — AB (ref 8.9–10.3)
CO2: 22 mmol/L (ref 22–32)
Chloride: 106 mmol/L (ref 101–111)
Creatinine, Ser: 0.99 mg/dL (ref 0.61–1.24)
GFR calc Af Amer: >60 mL/min (ref >60)
GFR calc non Af Amer: >60 mL/min (ref >60)
GLUCOSE: 111 mg/dL — AB (ref 65–99)
Potassium: 3.2 mmol/L — ABNORMAL LOW (ref 3.5–5.1)
Sodium: 138 mmol/L (ref 135–145)

## 2017-07-18 LAB — MAGNESIUM: Magnesium: 1.8 mg/dL (ref 1.7–2.4)

## 2017-07-18 MED ORDER — HEPARIN SOD (PORK) LOCK FLUSH 100 UNIT/ML IV SOLN
500.0000 [IU] | Freq: Once | INTRAVENOUS | Status: AC
  Administered 2017-07-18: 500 [IU] via INTRAVENOUS
  Filled 2017-07-18: qty 5

## 2017-07-18 MED ORDER — LORAZEPAM 0.5 MG PO TABS: ORAL_TABLET | ORAL | 3 refills | Status: DC

## 2017-07-18 MED ORDER — NYSTATIN POWD: 1 refills | Status: DC

## 2017-07-18 MED ORDER — SODIUM CHLORIDE 0.9% FLUSH
10.0000 mL | INTRAVENOUS | Status: DC | PRN
  Administered 2017-07-18: 10 mL via INTRAVENOUS
  Filled 2017-07-18: qty 10

## 2017-07-18 NOTE — Assessment & Plan Note (Addendum)
#    Colon cancer with metastases- peritoneal/omental metastases-status post small bowel resection;  AUG 31st CT-Progression- in the liver/omental metastases/lung metastases. Poor tolerance to Vectibex; discontinued vectibex.   # Proceed with Lonsurf starting today. Discussed the potential side effects including but not limited to neutropenia nausea vomiting diarrhea and mucositis.   # left abdomen ulcer- ?Pyoderma gangrenosum vs  Malignant. Discussed with ostomy nurse. On prednisone topical- improving.   # Bilateral groin skin rash- dermatitis from vectibex/superinfection fungal-improved.  # pain management/chronic pain-STABLE; Continue  fentanyl 75 g every 72 hours and  oxycodone every 6-8 hours.   # anxiety-stable.  will refill ativan today.  # Hypokalemia- today 3.2; noncompliance with oral supplementation. Improving.  # follow up in 4 weeks/labs- CEA.

## 2017-07-18 NOTE — Progress Notes (Signed)
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 Specimen integrity: Intact. Macroscopic tumor perforation: Not identified. Invasive tumor: Maximum size: 5.5 cm. Histologic type(s): Invasive adenocarcinoma. Histologic grade and differentiation: G1: well differentiated/low grade  Pathologic Staging: pT4b, pN1b, pM1b . 2.Patient was started on FOLFOX in September of 2016,after 2 cycles of chemotherapy patient developed neuropathy. Patient had a previous neuropathy as a baseline which increased so was switched over to FOLFIRI I IN December of 2016 As patient has clear os wild-type we can proceed to add either a Avastin or cetuximab from the next chemotherapy.(November, 2016)  Avastin was added as abdominal wound has completely healed now Avastin on hold (December, 2016) because of hypertension Chemotherapy was discontinued after protocol 8 cycle because of significant side effect (last chemotherapy was on October 28, 2015).  # DEC 2017- small bowel obstruction s/p resection [Dr.Hoxworth]- positive for recurrence. JAN 30th PET- liver mets/  lung/omental-peritoneal mets  # FEB 5th 2018-  FOLFIRI + vectibex [vec x1 stopped sec to diarrhea]; May 31st CT- improved; then May 23rd 2018- 5FU+avastin- CT AUG 31st 2018- Progression; SEP 2018- Vectibex single agent; STOPPED end of sep 2018 [intol];   # NOV 5th START- LONSURF  # Hx of Bil Kidney stones  # sarcoidosis/ Lung- [[facility]]- on surveillance  # Neuropathy- lyrica -off. ? 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  male  with above history of recurrent metastatic colon cancer to the liver/omentum on palliative chemo- most recently on vectibex; currently discontinued because of multiple side effects/intolerance.  Patient feels better the last few weeks- the rash in the groin is better. It is not completely resolved.  Patient notes to have ulcerated lesion next to the ostomy- started to develop 4 weeks ago; is currently using prednisone powder for this.  Chronic mild abdominal pain not any worse. Denies any significant nausea or vomiting. Overall he feels poorly. Denies any diarrhea. Continues to take Ativan for anxiety.  REVIEW OF SYSTEMS:  A complete 10 point review of system is done which is negative except mentioned above/history of present illness.   PAST MEDICAL HISTORY :  Past Medical History:  Diagnosis Date  . Abdominal pain    mid abdomen  . Adiposity 03/21/2015  . Arthritis   . 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 Colon cancer ([place])   . 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 Extreme obesity 12/21/2012  . GERD (gastroesophageal reflux disease)   . History of kidney stones   . History of transfusion   . Hypertension    has been  off BP med x 6 months  . Iron deficiency anemia due to chronic blood loss 05/08/2015  . LBP (low back pain) 05/28/2014  .  Neuropathy   . PONV (postoperative nausea and vomiting)   . Sarcoid 10/2012  . Skin cancer    "it flairs up with sarcoidosis"    PAST SURGICAL HISTORY :   Past Surgical History:  Procedure Laterality Date  . CARDIAC CATHETERIZATION      FAMILY HISTORY :   Family History  Problem Relation Age of Onset  . Arthritis Mother   . Heart disease Mother   . Stroke Father   . Prostate cancer Neg Hx   . Bladder Cancer Neg Hx     SOCIAL HISTORY:   Social History   Tobacco Use  . Smoking status: Passive Smoke Exposure - Never Smoker  . Smokeless tobacco: Never Used  Substance Use Topics  . Alcohol use: No  . Drug use: No    ALLERGIES:  is allergic to versed [midazolam]; gabapentin; paba derivatives; and benzyl benzoate.  MEDICATIONS:  Current Outpatient Medications  Medication Sig Dispense Refill  . fentaNYL (DURAGESIC - DOSED MCG/HR) 75 MCG/HR Place 1 patch (75 mcg total) onto the skin every 3 (three) days. 10 patch 0  . lidocaine-prilocaine (EMLA) cream Apply 1 application topically as needed. Apply small amount to port site at least 1 hour prior to it being accessed, cover with plastic wrap 30 g 1  . loperamide (IMODIUM A-D) 2 MG tablet Take 2 mg by mouth 4 (four) times daily as needed for diarrhea or loose stools.    Marland Kitchen LORazepam (ATIVAN) 0.5 MG tablet Take every 8 hours as needed for nausea/anxiety. 90 tablet 3  . Nystatin POWD Apply to bilateral abdominal skin folds TID or as needed. Make sure skin is completely dry. 2 Bottle 1  . Oxycodone HCl 10 MG TABS Take 1 tablet (10 mg total) by mouth every 6 (six) hours as needed (severe pain). 60 tablet 0  . predniSONE (DELTASONE) 1 MG tablet Crush 1 tablet daily and mix with stoma paste and apply to wound 30 tablet 1  . prochlorperazine (COMPAZINE) 10 MG tablet Take 1 tablet (10 mg total) by mouth every 6 (six) hours as needed for nausea or vomiting. 60 tablet 3  . promethazine (PHENERGAN) 25 MG tablet Take 1 tablet (25 mg total) by  mouth every 6 (six) hours as needed. 60 tablet 6  . sucralfate (CARAFATE) 1 g tablet Take 1 tablet (1 g total) by mouth 4 (four) times daily. 120 tablet 1  . zolpidem (AMBIEN) 10 MG tablet Take 10-15 mg by mouth at bedtime as needed for sleep.    . diphenoxylate-atropine (LOMOTIL) 2.5-0.025 MG tablet TAKE 1 TABLET BY MOUTH 4 TIMES DAILY AS NEEDED FOR DIARRHEA OR LOOSE STOOLS (Patient not taking: Reported on 07/18/2017) 30 tablet 1  . trifluridine-tipiracil (LONSURF) 20-8.19 MG tablet Take 4 tablets (80 mg trifluridine) by mouth two times daily after a meal. 1 hr after AM & PM meals on days 1-5, 8-12. Repeat every 28day (Patient not taking: Reported on 07/18/2017) 80 tablet 4   No current facility-administered medications for this visit.    Facility-Administered Medications Ordered in Other Visits  Medication Dose Route Frequency Provider Last Rate Last Dose  . sodium chloride flush (NS) 0.9 % injection 10 mL  10 mL Intravenous PRN Forest Gleason, MD   10 mL at 02/11/16 1326  . sodium chloride flush (NS) 0.9 % injection 10 mL  10 mL Intravenous PRN Cammie Sickle, MD   10 mL at 07/18/17 0816    PHYSICAL EXAMINATION: ECOG PERFORMANCE STATUS: 0 - Asymptomatic  BP 118/80 (BP Location: Left Arm, Patient Position: Sitting)   Pulse 80   Temp 98 F (36.7 C) (Tympanic)   Resp 20   Ht 5' 11"  (1.803 m)   Wt 277 lb 6.4 oz (125.8 kg)   BMI 38.69 kg/m   Filed Weights   07/18/17 0900  Weight: 277 lb 6.4 oz (125.8 kg)    GENERAL: Obese Caucasian male patient. Alert, no distress and comfortable.  Obese. Accompanied by is wife. Walking by himself.  EYES: no pallor or icterus OROPHARYNX: no thrush or ulceration; good dentition  NECK: supple, no masses felt LYMPH:  no palpable lymphadenopathy in the cervical, axillary or inguinal regions LUNGS: clear to auscultation and  No wheeze or crackles HEART/CVS: regular rate & rhythm and no murmurs; No lower extremity edema ABDOMEN:abdomen soft,  non-tender and normal bowel sounds; Positive for colostomy. Incisions were healing. Approximately 2 cm sized ulcer at 4:00 position. Ventral hernia noted. Musculoskeletal:no cyanosis of digits and no clubbing  PSYCH: alert & oriented x 3 with fluent speech. Tearful.  NEURO: no focal motor/sensory deficits SKIN:  Patient noted skin rash in the groin bilaterally extending onto the scrotum [overall improved]  LABORATORY DATA:  I have reviewed the data as listed    Component Value Date/Time   NA 138 07/18/2017 0845   NA 138 01/02/2015 1555   K 3.2 (L) 07/18/2017 0845   K 3.7 01/02/2015 1555   CL 106 07/18/2017 0845   CL 109 01/02/2015 1555   CO2 22 07/18/2017 0845   CO2 22 01/02/2015 1555   GLUCOSE 111 (H) 07/18/2017 0845   GLUCOSE 110 (H) 01/02/2015 1555   BUN 6 07/18/2017 0845   BUN 9 01/02/2015 1555   CREATININE 0.99 07/18/2017 0845   CREATININE 1.04 01/02/2015 1555   CALCIUM 8.5 (L) 07/18/2017 0845   CALCIUM 8.8 (L) 01/02/2015 1555   PROT 7.2 07/04/2017 0932   PROT 8.2 (H) 01/02/2015 1555   ALBUMIN 3.4 (L) 07/04/2017 0932   ALBUMIN 4.0 01/02/2015 1555   AST 31 07/04/2017 0932   AST 39 01/02/2015 1555   ALT 16 (L) 07/04/2017 0932   ALT 55 01/02/2015 1555   ALKPHOS 97 07/04/2017 0932   ALKPHOS 125 01/02/2015 1555   BILITOT 1.0 07/04/2017 0932   BILITOT 0.5 01/02/2015 1555   GFRNONAA >60 07/18/2017 0845   GFRNONAA >60 01/02/2015 1555   GFRAA >60 07/18/2017 0845   GFRAA >60 01/02/2015 1555    No results found for: SPEP, UPEP  Lab Results  Component Value Date   WBC 7.7 07/18/2017   NEUTROABS 6.2 07/18/2017   HGB 13.8 07/18/2017   HCT 40.6 07/18/2017   MCV 90.8 07/18/2017   PLT 206 07/18/2017      Chemistry      Component Value Date/Time   NA 138 07/18/2017 0845   NA 138 01/02/2015 1555   K 3.2 (L) 07/18/2017 0845   K 3.7 01/02/2015 1555   CL 106 07/18/2017 0845   CL 109 01/02/2015 1555   CO2 22 07/18/2017 0845   CO2 22 01/02/2015 1555   BUN 6 07/18/2017  0845   BUN 9 01/02/2015 1555   CREATININE 0.99 07/18/2017 0845   CREATININE 1.04 01/02/2015 1555      Component Value Date/Time   CALCIUM 8.5 (L) 07/18/2017 0845   CALCIUM 8.8 (L)  01/02/2015 1555   ALKPHOS 97 07/04/2017 0932   ALKPHOS 125 01/02/2015 1555   AST 31 07/04/2017 0932   AST 39 01/02/2015 1555   ALT 16 (L) 07/04/2017 0932   ALT 55 01/02/2015 1555   BILITOT 1.0 07/04/2017 0932   BILITOT 0.5 01/02/2015 1555     IMPRESSION: 1. Generally worsened malignancy with new and enlarging pulmonary nodules; some stable and some enlarging hepatic metastatic lesions; and variable appearance of the omental/mesenteric deposits of tumor some of which are larger and some of which are smaller than before. 2. A loop of small bowel demonstrates pneumatosis intestinalis, with a cystic component along part of the bowel wall margin. There is also a trace amount of portal venous gas in the adjacent mesentery. Pneumatosis intestinalis can be due to benign/incidental causes, but clearly can also be associated with bowel ischemia/bowel infarct. Worrisome findings include the small amount of portal venous gas and the mild small bowel dilatation in this vicinity. The lack of overt bowel wall thickening is reassuring. If the patient has abdominal pain or of the clinical exam is indeterminate and surgical referral for further assessment may be warranted. 3. Other imaging findings of potential clinical significance: Mild distal esophageal wall thickening, query esophagitis. Diffuse hepatic steatosis. Bilateral nonobstructive nephrolithiasis. Radiology assistant personnel have been notified to put me in telephone contact with the referring physician or the referring physician's clinical representative in order to discuss these findings. Once this communication is established I will issue an addendum to this report for documentation purposes.  Electronically Signed: By: Van Clines M.D. On:  05/13/2017 11:09  RADIOGRAPHIC STUDIES: I have personally reviewed the radiological images as listed and agreed with the findings in the report. No results found. Results for [name], [name] (MRN [id number]) as of 06/20/2017 09:59  Ref. 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 Poor tolerance to Vectibex; discontinued vectibex.   # Proceed with Lonsurf starting today. Discussed the potential side effects including but not limited to neutropenia nausea vomiting diarrhea and mucositis.   # left abdomen ulcer- ?Pyoderma gangrenosum vs  Malignant. Discussed with ostomy nurse. On prednisone topical- improving.   # Bilateral groin skin rash- dermatitis from vectibex/superinfection fungal-improved.  # pain management/chronic pain-STABLE; Continue  fentanyl 75 g every 72 hours and  oxycodone every 6-8 hours.   # anxiety-stable.  will refill ativan today.  # Hypokalemia- today 3.2; noncompliance with oral supplementation. Improving.  # follow up in 4 weeks/labs- CEA.   Orders Placed This Encounter  Procedures  . CBC with Differential    Standing Status:   Future    Standing Expiration Date:   07/18/2018  . Comprehensive metabolic panel    Standing Status:   Future    Standing Expiration Date:   07/18/2018  . CEA    Standing Status:   Future    Standing Expiration Date:   07/18/2018     Cammie Sickle, MD 07/18/2017 12:29 PM

## 2017-07-19 ENCOUNTER — Other Ambulatory Visit: Payer: Self-pay

## 2017-07-19 ENCOUNTER — Telehealth: Payer: Self-pay | Admitting: *Deleted

## 2017-07-19 LAB — CEA: CEA1: 99.6 ng/mL — AB (ref 0.0–4.7)

## 2017-07-19 MED ORDER — LORAZEPAM 0.5 MG PO TABS: ORAL_TABLET | ORAL | 3 refills | Status: DC

## 2017-07-19 NOTE — Telephone Encounter (Signed)
New Rx has been printed, signed and faxed in to phamacy. Thanks!

## 2017-07-19 NOTE — Telephone Encounter (Signed)
Wife reports that pharmacy states they have not received Lorazepam prescription that was to be sent yesterday

## 2017-07-25 ENCOUNTER — Telehealth: Payer: Self-pay | Admitting: Pharmacist

## 2017-07-25 DIAGNOSIS — C772 Secondary and unspecified malignant neoplasm of intra-abdominal lymph nodes: Secondary | ICD-10-CM

## 2017-07-25 DIAGNOSIS — C186 Malignant neoplasm of descending colon: Secondary | ICD-10-CM

## 2017-07-25 MED ORDER — ONDANSETRON HCL 8 MG PO TABS: 8.0000 mg | ORAL_TABLET | Freq: Three times a day (TID) | ORAL | 2 refills | Status: DC | PRN

## 2017-07-25 NOTE — Telephone Encounter (Signed)
Oral Chemotherapy Pharmacist Encounter  Received a VM from Mrs. Eyster stating that the [place] was making Mr. Trull sick. I called Mrs. Finger to see what was going on and she stated that, during his first week of Lonsurf, Mr. Calvo vomited M/W/F and has been nauseous in between. He is taking prochlorperazine, promethazine, and lorazepam but is still having breakthrough nausea. She also stated that some of the nausea occurs right before he takes the medication. This may be anticipatory nausea.  Spoke with [name], the plan is to try Mr. Paget on Zofran (ondansetron) and see if that mechanism of action better controls his N/V. Of the prochlorperazine and promethazine, Mr. Ortwein believes that promethazine is helping him the most. Because of the risk of QTC prolongation of prochlorperazine, promethazine, and ondansetron, I recommended they stop the prochlorperazine when they start the ondansetron .   Zofran (ondansetron) was e-scribed to their requested pharmacy.   They know to call with any concerns.   Darl Pikes, PharmD, BCPS Hematology/Oncology Clinical Pharmacist [name] [phone]  07/25/2017 3:44 PM

## 2017-08-15 ENCOUNTER — Inpatient Hospital Stay: Payer: BLUE CROSS/BLUE SHIELD | Attending: Internal Medicine | Admitting: Internal Medicine

## 2017-08-15 ENCOUNTER — Other Ambulatory Visit: Payer: Self-pay

## 2017-08-15 ENCOUNTER — Inpatient Hospital Stay: Payer: BLUE CROSS/BLUE SHIELD

## 2017-08-15 VITALS — BP 122/70 | HR 82 | Temp 98.2°F | Resp 22

## 2017-08-15 DIAGNOSIS — C7800 Secondary malignant neoplasm of unspecified lung: Secondary | ICD-10-CM | POA: Diagnosis not present

## 2017-08-15 DIAGNOSIS — C186 Malignant neoplasm of descending colon: Secondary | ICD-10-CM

## 2017-08-15 DIAGNOSIS — L309 Dermatitis, unspecified: Secondary | ICD-10-CM | POA: Diagnosis not present

## 2017-08-15 DIAGNOSIS — Z9049 Acquired absence of other specified parts of digestive tract: Secondary | ICD-10-CM | POA: Diagnosis not present

## 2017-08-15 DIAGNOSIS — K760 Fatty (change of) liver, not elsewhere classified: Secondary | ICD-10-CM | POA: Diagnosis not present

## 2017-08-15 DIAGNOSIS — I10 Essential (primary) hypertension: Secondary | ICD-10-CM | POA: Insufficient documentation

## 2017-08-15 DIAGNOSIS — K219 Gastro-esophageal reflux disease without esophagitis: Secondary | ICD-10-CM | POA: Diagnosis not present

## 2017-08-15 DIAGNOSIS — C787 Secondary malignant neoplasm of liver and intrahepatic bile duct: Secondary | ICD-10-CM | POA: Diagnosis not present

## 2017-08-15 DIAGNOSIS — Z79899 Other long term (current) drug therapy: Secondary | ICD-10-CM | POA: Insufficient documentation

## 2017-08-15 DIAGNOSIS — Z87442 Personal history of urinary calculi: Secondary | ICD-10-CM | POA: Diagnosis not present

## 2017-08-15 DIAGNOSIS — E876 Hypokalemia: Secondary | ICD-10-CM | POA: Insufficient documentation

## 2017-08-15 DIAGNOSIS — Z85828 Personal history of other malignant neoplasm of skin: Secondary | ICD-10-CM | POA: Insufficient documentation

## 2017-08-15 DIAGNOSIS — F419 Anxiety disorder, unspecified: Secondary | ICD-10-CM | POA: Diagnosis not present

## 2017-08-15 DIAGNOSIS — D869 Sarcoidosis, unspecified: Secondary | ICD-10-CM | POA: Insufficient documentation

## 2017-08-15 DIAGNOSIS — E669 Obesity, unspecified: Secondary | ICD-10-CM | POA: Insufficient documentation

## 2017-08-15 DIAGNOSIS — Z7722 Contact with and (suspected) exposure to environmental tobacco smoke (acute) (chronic): Secondary | ICD-10-CM | POA: Diagnosis not present

## 2017-08-15 DIAGNOSIS — C786 Secondary malignant neoplasm of retroperitoneum and peritoneum: Secondary | ICD-10-CM | POA: Diagnosis not present

## 2017-08-15 DIAGNOSIS — G893 Neoplasm related pain (acute) (chronic): Secondary | ICD-10-CM | POA: Diagnosis not present

## 2017-08-15 DIAGNOSIS — C772 Secondary and unspecified malignant neoplasm of intra-abdominal lymph nodes: Secondary | ICD-10-CM

## 2017-08-15 DIAGNOSIS — L88 Pyoderma gangrenosum: Secondary | ICD-10-CM | POA: Insufficient documentation

## 2017-08-15 LAB — CBC WITH DIFFERENTIAL/PLATELET
Basophils Absolute: 0 10*3/uL (ref 0–0.1)
Basophils Relative: 1 %
EOS PCT: 1 %
Eosinophils Absolute: 0 10*3/uL (ref 0–0.7)
HEMATOCRIT: 36.2 % — AB (ref 40.0–52.0)
Hemoglobin: 12.2 g/dL — ABNORMAL LOW (ref 13.0–18.0)
LYMPHS ABS: 0.9 10*3/uL — AB (ref 1.0–3.6)
LYMPHS PCT: 39 %
MCH: 31.1 pg (ref 26.0–34.0)
MCHC: 33.7 g/dL (ref 32.0–36.0)
MCV: 92.3 fL (ref 80.0–100.0)
MONO ABS: 0.4 10*3/uL (ref 0.2–1.0)
Monocytes Relative: 19 %
Neutro Abs: 0.9 10*3/uL — ABNORMAL LOW (ref 1.4–6.5)
Neutrophils Relative %: 40 %
PLATELETS: 179 10*3/uL (ref 150–440)
RBC: 3.92 MIL/uL — ABNORMAL LOW (ref 4.40–5.90)
RDW: 17.4 % — AB (ref 11.5–14.5)
WBC: 2.3 10*3/uL — ABNORMAL LOW (ref 3.8–10.6)

## 2017-08-15 LAB — COMPREHENSIVE METABOLIC PANEL
ALBUMIN: 3.6 g/dL (ref 3.5–5.0)
ALT: 30 U/L (ref 17–63)
AST: 39 U/L (ref 15–41)
Alkaline Phosphatase: 105 U/L (ref 38–126)
Anion gap: 10 (ref 5–15)
BILIRUBIN TOTAL: 0.7 mg/dL (ref 0.3–1.2)
BUN: 8 mg/dL (ref 6–20)
CHLORIDE: 105 mmol/L (ref 101–111)
CO2: 24 mmol/L (ref 22–32)
Calcium: 8.7 mg/dL — ABNORMAL LOW (ref 8.9–10.3)
Creatinine, Ser: 1.18 mg/dL (ref 0.61–1.24)
GFR calc Af Amer: >60 mL/min (ref >60)
GFR calc non Af Amer: >60 mL/min (ref >60)
GLUCOSE: 115 mg/dL — AB (ref 65–99)
POTASSIUM: 3.5 mmol/L (ref 3.5–5.1)
SODIUM: 139 mmol/L (ref 135–145)
Total Protein: 7.4 g/dL (ref 6.5–8.1)

## 2017-08-15 MED ORDER — FENTANYL 75 MCG/HR TD PT72: 75.0000 ug | MEDICATED_PATCH | TRANSDERMAL | 0 refills | Status: DC

## 2017-08-15 MED ORDER — OXYCODONE HCL 10 MG PO TABS: 10.0000 mg | ORAL_TABLET | Freq: Four times a day (QID) | ORAL | 0 refills | Status: DC | PRN

## 2017-08-15 MED ORDER — OMEPRAZOLE 20 MG PO CPDR: 20.0000 mg | DELAYED_RELEASE_CAPSULE | Freq: Two times a day (BID) | ORAL | 4 refills | Status: AC

## 2017-08-15 NOTE — Assessment & Plan Note (Addendum)
#    Colon cancer with metastases- peritoneal/omental metastases-status post small bowel resection;  AUG 31st CT-Progression- in the liver/omental metastases/lung metastases. Poor tolerance to Vectibex; discontinued vectibex.  November 2018 CEA/elevated 99.  #  HOLD cycle #2 today; [place]- 900; repeat cbc in 1 week; and then recommend starting again.   # Reflux/heart burn- recommend Prilosec 20 mg BID.   # left abdomen ulcer- ?Pyoderma gangrenosum-. On prednisone topical- improving.   # Bilateral groin skin rash- dermatitis from vectibex/superinfection fungal-resolved.  # pain management/chronic pain-STABLE; Continue  fentanyl 75 g every 72 hours and  oxycodone every 6-8 hours. New scripts given.   # anxiety-stable.   # Hypokalemia- today 3.5/improved. .  # follow up in jan 7th weeks/labs- CEA. Repeat cbc in 1 week/call us- will recommend re-starting at that time.

## 2017-08-15 NOTE — Progress Notes (Signed)
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 Specimen integrity: Intact. Macroscopic tumor perforation: Not identified. Invasive tumor: Maximum size: 5.5 cm. Histologic type(s): Invasive adenocarcinoma. Histologic grade and differentiation: G1: well differentiated/low grade  Pathologic Staging: pT4b, pN1b, pM1b . 2.Patient was started on FOLFOX in September of 2016,after 2 cycles of chemotherapy patient developed neuropathy. Patient had a previous neuropathy as a baseline which increased so was switched over to FOLFIRI I IN December of 2016 As patient has clear os wild-type we can proceed to add either a Avastin or cetuximab from the next chemotherapy.(November, 2016)  Avastin was added as abdominal wound has completely healed now Avastin on hold (December, 2016) because of hypertension Chemotherapy was discontinued after protocol 8 cycle because of significant side effect (last chemotherapy was on October 28, 2015).  # DEC 2017- small bowel obstruction s/p resection [Dr.Hoxworth]- positive for recurrence. JAN 30th PET- liver mets/  lung/omental-peritoneal mets  # FEB 5th 2018-  FOLFIRI + vectibex [vec x1 stopped sec to diarrhea]; May 31st CT- improved; then May 23rd 2018- 5FU+avastin- CT AUG 31st 2018- Progression; SEP 2018- Vectibex single agent; STOPPED end of sep 2018 [intol];   # NOV 5th START- LONSURF  # Hx of Bil Kidney stones  # sarcoidosis/ Lung- [[facility]]- on surveillance  # Neuropathy- lyrica -off. ? 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  male  with above history of recurrent metastatic colon cancer to the liver/omentum on palliative chemo- currently on Lonsurf [nov 5th 2018].  Patient complains of reflux in the last few weeks.  Denies any fevers or chills.  Continues to have chronic abdominal pain not any worse.  Chronic tingling and numbness.  Stable.  His rash in his groin/body has improved.  The ulcerated lesion around the ostomy is improved.  Continue to use prednisone powder at this time.  Denies any diarrhea.  Denies any oral ulcers.  REVIEW OF SYSTEMS:  A complete 10 point review of system is done which is negative except mentioned above/history of present illness.   PAST MEDICAL HISTORY :  Past Medical History:  Diagnosis Date  . Abdominal pain    mid abdomen  . Adiposity 03/21/2015  . Arthritis   . 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 Colon cancer ([place])   . 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 Extreme obesity 12/21/2012  . GERD (gastroesophageal reflux disease)   . History of kidney stones   . History of transfusion   . Hypertension    has been off BP med x 6 months  .  Iron deficiency anemia due to chronic blood loss 05/08/2015  . LBP (low back pain) 05/28/2014  . Neuropathy   . PONV (postoperative nausea and vomiting)   . Sarcoid 10/2012  . Skin cancer    "it  flairs up with sarcoidosis"    PAST SURGICAL HISTORY :   Past Surgical History:  Procedure Laterality Date  . CARDIAC CATHETERIZATION    . FLEXIBLE SIGMOIDOSCOPY N/A 05/07/2015   Procedure: FLEXIBLE SIGMOIDOSCOPY;  Surgeon: Laurence Spates, MD;  Location: [facility];  Service: Endoscopy;  Laterality: N/A;  . LAPAROSCOPIC PARTIAL COLECTOMY N/A 05/07/2015   Procedure: LAPAROSCOPIC ASSITED PARTIAL COLECTOMY WITH COLOSTOMY, SMALL BOWEL RESECTION, EXCISION OF PERITONEAL NODULE;  Surgeon: [name], MD;  Location: [facility];  Service: General;  Laterality: N/A;  . LAPAROTOMY N/A 08/10/2016   Procedure: EXPLORATORY LAPAROTOMY small bowel resection abdominal wall resection partial omentectomy;  Surgeon: Excell Seltzer, MD;  Location: [facility];  Service: General;  Laterality: N/A;  . PORTACATH PLACEMENT Right 05/27/2015   Procedure: INSERTION PORT-A-CATH;  Surgeon: [name], MD;  Location: [facility];  Service: General;  Laterality: Right;    FAMILY HISTORY :   Family History  Problem Relation Age of Onset  . Arthritis Mother   . Heart disease Mother   . Stroke Father   . Prostate cancer Neg Hx   . Bladder Cancer Neg Hx     SOCIAL HISTORY:   Social History   Tobacco Use  . Smoking status: Passive Smoke Exposure - Never Smoker  . Smokeless tobacco: Never Used  Substance Use Topics  . Alcohol use: No  . Drug use: No    ALLERGIES:  is allergic to versed [midazolam]; gabapentin; paba derivatives; and benzyl benzoate.  MEDICATIONS:  Current Outpatient Medications  Medication Sig Dispense Refill  . fentaNYL (DURAGESIC - DOSED MCG/HR) 75 MCG/HR Place 1 patch (75 mcg total) onto the skin every 3 (three) days. 10 patch 0  . lidocaine-prilocaine (EMLA) cream Apply 1 application topically as needed. Apply small amount to port site at least 1 hour prior to it being accessed, cover with plastic wrap 30 g 1  . loperamide (IMODIUM A-D) 2 MG tablet Take 2 mg by mouth 4 (four) times daily as  needed for diarrhea or loose stools.    Marland Kitchen LORazepam (ATIVAN) 0.5 MG tablet Take every 8 hours as needed for nausea/anxiety. 90 tablet 3  . Nystatin POWD Apply to bilateral abdominal skin folds TID or as needed. Make sure skin is completely dry. 2 Bottle 1  . ondansetron (ZOFRAN) 8 MG tablet Take 1 tablet (8 mg total) every 8 (eight) hours as needed by mouth for nausea or vomiting. 42 tablet 2  . Oxycodone HCl 10 MG TABS Take 1 tablet (10 mg total) by mouth every 6 (six) hours as needed (severe pain). 60 tablet 0  . predniSONE (DELTASONE) 1 MG tablet Crush 1 tablet daily and mix with stoma paste and apply to wound 30 tablet 1  . prochlorperazine (COMPAZINE) 10 MG tablet Take 1 tablet (10 mg total) by mouth every 6 (six) hours as needed for nausea or vomiting. 60 tablet 3  . promethazine (PHENERGAN) 25 MG tablet Take 1 tablet (25 mg total) by mouth every 6 (six) hours as needed. 60 tablet 6  . sucralfate (CARAFATE) 1 g tablet Take 1 tablet (1 g total) by mouth 4 (four) times daily. 120 tablet 1  . trifluridine-tipiracil (LONSURF) 20-8.19 MG tablet Take 4 tablets (80 mg trifluridine) by mouth two times  daily after a meal. 1 hr after AM & PM meals on days 1-5, 8-12. Repeat every 28day 80 tablet 4  . zolpidem (AMBIEN) 10 MG tablet Take 10-15 mg by mouth at bedtime as needed for sleep.    . diphenoxylate-atropine (LOMOTIL) 2.5-0.025 MG tablet TAKE 1 TABLET BY MOUTH 4 TIMES DAILY AS NEEDED FOR DIARRHEA OR LOOSE STOOLS (Patient not taking: Reported on 07/18/2017) 30 tablet 1  . omeprazole (PRILOSEC) 20 MG capsule Take 1 capsule (20 mg total) by mouth 2 (two) times daily before a meal. 60 capsule 4   No current facility-administered medications for this visit.    Facility-Administered Medications Ordered in Other Visits  Medication Dose Route Frequency Provider Last Rate Last Dose  . sodium chloride flush (NS) 0.9 % injection 10 mL  10 mL Intravenous PRN Choksi, Delorise Shiner, MD   10 mL at 02/11/16 1326     PHYSICAL EXAMINATION: ECOG PERFORMANCE STATUS: 0 - Asymptomatic  BP 122/70   Pulse 82 Comment: radial pulse  Temp 98.2 F (36.8 C) (Tympanic)   Resp (!) 22   There were no vitals filed for this visit.  GENERAL: Obese Caucasian male patient. Alert, no distress and comfortable.  Obese. Accompanied by is wife. Walking by himself.  EYES: no pallor or icterus OROPHARYNX: no thrush or ulceration; good dentition  NECK: supple, no masses felt LYMPH:  no palpable lymphadenopathy in the cervical, axillary or inguinal regions LUNGS: clear to auscultation and  No wheeze or crackles HEART/CVS: regular rate & rhythm and no murmurs; No lower extremity edema ABDOMEN:abdomen soft, non-tender and normal bowel sounds; Positive for colostomy. Incisions were healing. Approximately 2 cm sized ulcer at 4:00 position. Ventral hernia noted. Musculoskeletal:no cyanosis of digits and no clubbing  PSYCH: alert & oriented x 3 with fluent speech. Tearful.  NEURO: no focal motor/sensory deficits SKIN: Rashes resolved.   LABORATORY DATA:  I have reviewed the data as listed    Component Value Date/Time   NA 139 08/15/2017 1043   NA 138 01/02/2015 1555   K 3.5 08/15/2017 1043   K 3.7 01/02/2015 1555   CL 105 08/15/2017 1043   CL 109 01/02/2015 1555   CO2 24 08/15/2017 1043   CO2 22 01/02/2015 1555   GLUCOSE 115 (H) 08/15/2017 1043   GLUCOSE 110 (H) 01/02/2015 1555   BUN 8 08/15/2017 1043   BUN 9 01/02/2015 1555   CREATININE 1.18 08/15/2017 1043   CREATININE 1.04 01/02/2015 1555   CALCIUM 8.7 (L) 08/15/2017 1043   CALCIUM 8.8 (L) 01/02/2015 1555   PROT 7.4 08/15/2017 1043   PROT 8.2 (H) 01/02/2015 1555   ALBUMIN 3.6 08/15/2017 1043   ALBUMIN 4.0 01/02/2015 1555   AST 39 08/15/2017 1043   AST 39 01/02/2015 1555   ALT 30 08/15/2017 1043   ALT 55 01/02/2015 1555   ALKPHOS 105 08/15/2017 1043   ALKPHOS 125 01/02/2015 1555   BILITOT 0.7 08/15/2017 1043   BILITOT 0.5 01/02/2015 1555   GFRNONAA  >60 08/15/2017 1043   GFRNONAA >60 01/02/2015 1555   GFRAA >60 08/15/2017 1043   GFRAA >60 01/02/2015 1555    No results found for: SPEP, UPEP  Lab Results  Component Value Date   WBC 2.3 (L) 08/15/2017   NEUTROABS 0.9 (L) 08/15/2017   HGB 12.2 (L) 08/15/2017   HCT 36.2 (L) 08/15/2017   MCV 92.3 08/15/2017   PLT 179 08/15/2017      Chemistry      Component Value Date/Time  NA 139 08/15/2017 1043   NA 138 01/02/2015 1555   K 3.5 08/15/2017 1043   K 3.7 01/02/2015 1555   CL 105 08/15/2017 1043   CL 109 01/02/2015 1555   CO2 24 08/15/2017 1043   CO2 22 01/02/2015 1555   BUN 8 08/15/2017 1043   BUN 9 01/02/2015 1555   CREATININE 1.18 08/15/2017 1043   CREATININE 1.04 01/02/2015 1555      Component Value Date/Time   CALCIUM 8.7 (L) 08/15/2017 1043   CALCIUM 8.8 (L) 01/02/2015 1555   ALKPHOS 105 08/15/2017 1043   ALKPHOS 125 01/02/2015 1555   AST 39 08/15/2017 1043   AST 39 01/02/2015 1555   ALT 30 08/15/2017 1043   ALT 55 01/02/2015 1555   BILITOT 0.7 08/15/2017 1043   BILITOT 0.5 01/02/2015 1555     IMPRESSION: 1. Generally worsened malignancy with new and enlarging pulmonary nodules; some stable and some enlarging hepatic metastatic lesions; and variable appearance of the omental/mesenteric deposits of tumor some of which are larger and some of which are smaller than before. 2. A loop of small bowel demonstrates pneumatosis intestinalis, with a cystic component along part of the bowel wall margin. There is also a trace amount of portal venous gas in the adjacent mesentery. Pneumatosis intestinalis can be due to benign/incidental causes, but clearly can also be associated with bowel ischemia/bowel infarct. Worrisome findings include the small amount of portal venous gas and the mild small bowel dilatation in this vicinity. The lack of overt bowel wall thickening is reassuring. If the patient has abdominal pain or of the clinical exam is indeterminate and  surgical referral for further assessment may be warranted. 3. Other imaging findings of potential clinical significance: Mild distal esophageal wall thickening, query esophagitis. Diffuse hepatic steatosis. Bilateral nonobstructive nephrolithiasis. Radiology assistant personnel have been notified to put me in telephone contact with the referring physician or the referring physician's clinical representative in order to discuss these findings. Once this communication is established I will issue an addendum to this report for documentation purposes.  Electronically Signed: By: Van Clines M.D. On: 05/13/2017 11:09  RADIOGRAPHIC STUDIES: I have personally reviewed the radiological images as listed and agreed with the findings in the report. No results found. Results for LIBERO, PUTHOFF (MRN [id number]) as of 06/20/2017 09:59  Ref. 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 Poor tolerance to Vectibex; discontinued vectibex.  November 2018 CEA/elevated 99.  #  HOLD cycle #2 today; [place]- 900; repeat cbc in 1 week; and then recommend starting again.   # Reflux/heart burn- recommend Prilosec 20 mg BID.   # left abdomen ulcer- ?Pyoderma gangrenosum-. On prednisone topical- improving.   # Bilateral groin skin rash- dermatitis from vectibex/superinfection fungal-resolved.  # pain management/chronic pain-STABLE; Continue  fentanyl 75 g every 72 hours and  oxycodone every 6-8 hours. New scripts given.   # anxiety-stable.   # Hypokalemia- today 3.5/improved. .  # follow up in jan 7th weeks/labs- CEA.  Repeat cbc in 1 week/call us- will recommend re-starting at that time.   Orders Placed This Encounter  Procedures  . CBC with Differential  Standing Status:   Future    Standing Expiration Date:   08/15/2018  . CBC with Differential    Standing Status:   Future    Standing Expiration Date:   08/15/2018  . Comprehensive metabolic panel    Standing Status:   Future    Standing Expiration Date:   08/15/2018  . CEA    Standing Status:   Future    Standing Expiration Date:   08/15/2018  . CBC with Differential    Standing Status:   Future    Standing Expiration Date:   08/15/2018     Cammie Sickle, MD 08/15/2017 1:46 PM

## 2017-08-15 NOTE — Progress Notes (Signed)
Patient reports improvement in nausea. Using zofran as directed. Would like to try ppi if possible to see if this improves acid reflux. Personally discussed pt's concerns with clinical pharmacist to review potential contraindications with Lonsurf and PPI-omeprazole and protonix- no contraindications listed. RN Will discuss pt's request with [name].

## 2017-08-16 LAB — CEA: CEA1: 106.8 ng/mL — AB (ref 0.0–4.7)

## 2017-08-18 ENCOUNTER — Telehealth: Payer: Self-pay | Admitting: *Deleted

## 2017-08-18 NOTE — Telephone Encounter (Signed)
[name] has concerns about the impending weather and is asking if they can come in tomorrow instead of Monday for lab check to see if he can restart his chemotherapy. Please advise

## 2017-08-18 NOTE — Telephone Encounter (Signed)
Dr. B says this is okay for patient to have labs tomorrow.  Colette, will you please call patient and have them scheduled to come in for labs tomorrow? Thank you!

## 2017-08-19 ENCOUNTER — Inpatient Hospital Stay: Payer: BLUE CROSS/BLUE SHIELD

## 2017-08-19 ENCOUNTER — Telehealth: Payer: Self-pay | Admitting: *Deleted

## 2017-08-19 DIAGNOSIS — C186 Malignant neoplasm of descending colon: Secondary | ICD-10-CM | POA: Diagnosis not present

## 2017-08-19 DIAGNOSIS — C772 Secondary and unspecified malignant neoplasm of intra-abdominal lymph nodes: Principal | ICD-10-CM

## 2017-08-19 LAB — CBC WITH DIFFERENTIAL/PLATELET
BASOS ABS: 0 10*3/uL (ref 0–0.1)
BASOS PCT: 1 %
Eosinophils Absolute: 0 10*3/uL (ref 0–0.7)
Eosinophils Relative: 0 %
HEMATOCRIT: 37.5 % — AB (ref 40.0–52.0)
HEMOGLOBIN: 12.5 g/dL — AB (ref 13.0–18.0)
LYMPHS PCT: 23 %
Lymphs Abs: 1 10*3/uL (ref 1.0–3.6)
MCH: 31.2 pg (ref 26.0–34.0)
MCHC: 33.3 g/dL (ref 32.0–36.0)
MCV: 93.9 fL (ref 80.0–100.0)
Monocytes Absolute: 0.5 10*3/uL (ref 0.2–1.0)
Monocytes Relative: 12 %
NEUTROS ABS: 2.9 10*3/uL (ref 1.4–6.5)
NEUTROS PCT: 64 %
Platelets: 151 10*3/uL (ref 150–440)
RBC: 3.99 MIL/uL — ABNORMAL LOW (ref 4.40–5.90)
RDW: 17.5 % — ABNORMAL HIGH (ref 11.5–14.5)
WBC: 4.5 10*3/uL (ref 3.8–10.6)

## 2017-08-19 NOTE — Telephone Encounter (Signed)
Patient's wife informed. She states that her husband will start back on Monday 08/22/17

## 2017-08-19 NOTE — Telephone Encounter (Signed)
-----   Message from Cammie Sickle, MD sent at 08/19/2017  1:04 PM EST ----- Please inform patient that he can start taking his Lonsurf as planned as his las look okay/better.

## 2017-08-22 ENCOUNTER — Inpatient Hospital Stay: Payer: BLUE CROSS/BLUE SHIELD

## 2017-08-30 ENCOUNTER — Other Ambulatory Visit: Payer: Self-pay | Admitting: *Deleted

## 2017-08-30 DIAGNOSIS — C186 Malignant neoplasm of descending colon: Secondary | ICD-10-CM

## 2017-08-30 DIAGNOSIS — C772 Secondary and unspecified malignant neoplasm of intra-abdominal lymph nodes: Principal | ICD-10-CM

## 2017-08-30 MED ORDER — ONDANSETRON HCL 8 MG PO TABS: 8.0000 mg | ORAL_TABLET | Freq: Three times a day (TID) | ORAL | 2 refills | Status: AC | PRN

## 2017-08-30 NOTE — Telephone Encounter (Signed)
Roxanne called asking for the wording to be changed on the Ondansetron He is order 8 mg every 8 hours He is taking it 3 - 4 times a day and only gets #42. Would like more than that because he is using it with the oral chemotherapy which makes him nauseated. Please advise

## 2017-08-30 NOTE — Telephone Encounter (Signed)
md submitted New rx to pharmacy for #90. Per md-insurance may or may not approve the increase in quanity, but MD can send in new rx per pt's request.

## 2017-09-07 ENCOUNTER — Other Ambulatory Visit: Payer: Self-pay | Admitting: *Deleted

## 2017-09-07 DIAGNOSIS — C772 Secondary and unspecified malignant neoplasm of intra-abdominal lymph nodes: Principal | ICD-10-CM

## 2017-09-07 DIAGNOSIS — C186 Malignant neoplasm of descending colon: Secondary | ICD-10-CM

## 2017-09-07 DIAGNOSIS — G893 Neoplasm related pain (acute) (chronic): Secondary | ICD-10-CM

## 2017-09-07 MED ORDER — OXYCODONE HCL 10 MG PO TABS: 10.0000 mg | ORAL_TABLET | Freq: Four times a day (QID) | ORAL | 0 refills | Status: DC | PRN

## 2017-09-07 MED ORDER — FENTANYL 75 MCG/HR TD PT72: 75.0000 ug | MEDICATED_PATCH | TRANSDERMAL | 0 refills | Status: DC

## 2017-09-12 ENCOUNTER — Other Ambulatory Visit: Payer: Self-pay

## 2017-09-12 ENCOUNTER — Emergency Department: Payer: BLUE CROSS/BLUE SHIELD

## 2017-09-12 ENCOUNTER — Inpatient Hospital Stay
Admission: EM | Admit: 2017-09-12 | Discharge: 2017-09-16 | DRG: 872 | Disposition: A | Discharge status: Hospice | Payer: BLUE CROSS/BLUE SHIELD | Attending: Internal Medicine | Admitting: Internal Medicine

## 2017-09-12 ENCOUNTER — Encounter: Payer: Self-pay | Admitting: Emergency Medicine

## 2017-09-12 ENCOUNTER — Inpatient Hospital Stay: Payer: BLUE CROSS/BLUE SHIELD | Admitting: Nurse Practitioner

## 2017-09-12 ENCOUNTER — Telehealth: Payer: Self-pay | Admitting: *Deleted

## 2017-09-12 DIAGNOSIS — D869 Sarcoidosis, unspecified: Secondary | ICD-10-CM | POA: Diagnosis not present

## 2017-09-12 DIAGNOSIS — R4182 Altered mental status, unspecified: Secondary | ICD-10-CM | POA: Diagnosis not present

## 2017-09-12 DIAGNOSIS — R109 Unspecified abdominal pain: Secondary | ICD-10-CM | POA: Diagnosis not present

## 2017-09-12 DIAGNOSIS — K559 Vascular disorder of intestine, unspecified: Secondary | ICD-10-CM | POA: Diagnosis not present

## 2017-09-12 DIAGNOSIS — R6520 Severe sepsis without septic shock: Secondary | ICD-10-CM

## 2017-09-12 DIAGNOSIS — R5081 Fever presenting with conditions classified elsewhere: Secondary | ICD-10-CM | POA: Diagnosis present

## 2017-09-12 DIAGNOSIS — C787 Secondary malignant neoplasm of liver and intrahepatic bile duct: Secondary | ICD-10-CM | POA: Diagnosis present

## 2017-09-12 DIAGNOSIS — Z9221 Personal history of antineoplastic chemotherapy: Secondary | ICD-10-CM

## 2017-09-12 DIAGNOSIS — Z515 Encounter for palliative care: Secondary | ICD-10-CM | POA: Diagnosis not present

## 2017-09-12 DIAGNOSIS — C186 Malignant neoplasm of descending colon: Secondary | ICD-10-CM | POA: Diagnosis not present

## 2017-09-12 DIAGNOSIS — Z888 Allergy status to other drugs, medicaments and biological substances status: Secondary | ICD-10-CM

## 2017-09-12 DIAGNOSIS — D709 Neutropenia, unspecified: Secondary | ICD-10-CM | POA: Diagnosis present

## 2017-09-12 DIAGNOSIS — D630 Anemia in neoplastic disease: Secondary | ICD-10-CM | POA: Diagnosis present

## 2017-09-12 DIAGNOSIS — K435 Parastomal hernia without obstruction or  gangrene: Secondary | ICD-10-CM | POA: Diagnosis not present

## 2017-09-12 DIAGNOSIS — R410 Disorientation, unspecified: Secondary | ICD-10-CM | POA: Diagnosis present

## 2017-09-12 DIAGNOSIS — E871 Hypo-osmolality and hyponatremia: Secondary | ICD-10-CM | POA: Diagnosis present

## 2017-09-12 DIAGNOSIS — T424X5A Adverse effect of benzodiazepines, initial encounter: Secondary | ICD-10-CM | POA: Diagnosis present

## 2017-09-12 DIAGNOSIS — R112 Nausea with vomiting, unspecified: Secondary | ICD-10-CM | POA: Diagnosis not present

## 2017-09-12 DIAGNOSIS — T375X5A Adverse effect of antiviral drugs, initial encounter: Secondary | ICD-10-CM | POA: Diagnosis not present

## 2017-09-12 DIAGNOSIS — Z7722 Contact with and (suspected) exposure to environmental tobacco smoke (acute) (chronic): Secondary | ICD-10-CM | POA: Diagnosis present

## 2017-09-12 DIAGNOSIS — Z933 Colostomy status: Secondary | ICD-10-CM | POA: Diagnosis not present

## 2017-09-12 DIAGNOSIS — C779 Secondary and unspecified malignant neoplasm of lymph node, unspecified: Secondary | ICD-10-CM | POA: Diagnosis present

## 2017-09-12 DIAGNOSIS — T451X5S Adverse effect of antineoplastic and immunosuppressive drugs, sequela: Secondary | ICD-10-CM | POA: Diagnosis not present

## 2017-09-12 DIAGNOSIS — C189 Malignant neoplasm of colon, unspecified: Secondary | ICD-10-CM | POA: Diagnosis present

## 2017-09-12 DIAGNOSIS — E872 Acidosis: Secondary | ICD-10-CM | POA: Diagnosis present

## 2017-09-12 DIAGNOSIS — I471 Supraventricular tachycardia: Secondary | ICD-10-CM | POA: Diagnosis present

## 2017-09-12 DIAGNOSIS — C786 Secondary malignant neoplasm of retroperitoneum and peritoneum: Secondary | ICD-10-CM | POA: Diagnosis present

## 2017-09-12 DIAGNOSIS — G893 Neoplasm related pain (acute) (chronic): Secondary | ICD-10-CM | POA: Diagnosis present

## 2017-09-12 DIAGNOSIS — C772 Secondary and unspecified malignant neoplasm of intra-abdominal lymph nodes: Secondary | ICD-10-CM

## 2017-09-12 DIAGNOSIS — D6481 Anemia due to antineoplastic chemotherapy: Secondary | ICD-10-CM | POA: Diagnosis present

## 2017-09-12 DIAGNOSIS — I10 Essential (primary) hypertension: Secondary | ICD-10-CM | POA: Diagnosis present

## 2017-09-12 DIAGNOSIS — A419 Sepsis, unspecified organism: Secondary | ICD-10-CM | POA: Diagnosis present

## 2017-09-12 DIAGNOSIS — Z66 Do not resuscitate: Secondary | ICD-10-CM | POA: Diagnosis present

## 2017-09-12 DIAGNOSIS — K219 Gastro-esophageal reflux disease without esophagitis: Secondary | ICD-10-CM | POA: Diagnosis present

## 2017-09-12 DIAGNOSIS — C762 Malignant neoplasm of abdomen: Secondary | ICD-10-CM | POA: Diagnosis present

## 2017-09-12 DIAGNOSIS — Z7189 Other specified counseling: Secondary | ICD-10-CM | POA: Diagnosis not present

## 2017-09-12 DIAGNOSIS — G894 Chronic pain syndrome: Secondary | ICD-10-CM | POA: Diagnosis present

## 2017-09-12 DIAGNOSIS — T402X5A Adverse effect of other opioids, initial encounter: Secondary | ICD-10-CM | POA: Diagnosis present

## 2017-09-12 DIAGNOSIS — E878 Other disorders of electrolyte and fluid balance, not elsewhere classified: Secondary | ICD-10-CM | POA: Diagnosis not present

## 2017-09-12 DIAGNOSIS — C7800 Secondary malignant neoplasm of unspecified lung: Secondary | ICD-10-CM | POA: Diagnosis present

## 2017-09-12 DIAGNOSIS — E669 Obesity, unspecified: Secondary | ICD-10-CM | POA: Diagnosis not present

## 2017-09-12 DIAGNOSIS — Z85828 Personal history of other malignant neoplasm of skin: Secondary | ICD-10-CM

## 2017-09-12 DIAGNOSIS — K6389 Other specified diseases of intestine: Secondary | ICD-10-CM | POA: Diagnosis not present

## 2017-09-12 DIAGNOSIS — Z9049 Acquired absence of other specified parts of digestive tract: Secondary | ICD-10-CM | POA: Diagnosis not present

## 2017-09-12 DIAGNOSIS — G629 Polyneuropathy, unspecified: Secondary | ICD-10-CM | POA: Diagnosis present

## 2017-09-12 DIAGNOSIS — Z6836 Body mass index (BMI) 36.0-36.9, adult: Secondary | ICD-10-CM

## 2017-09-12 DIAGNOSIS — E876 Hypokalemia: Secondary | ICD-10-CM | POA: Diagnosis present

## 2017-09-12 HISTORY — DX: Neutropenia, unspecified: D70.9

## 2017-09-12 LAB — COMPREHENSIVE METABOLIC PANEL
ALBUMIN: 3.6 g/dL (ref 3.5–5.0)
ALK PHOS: 117 U/L (ref 38–126)
ALT: 24 U/L (ref 17–63)
AST: 36 U/L (ref 15–41)
Anion gap: 13 (ref 5–15)
BILIRUBIN TOTAL: 3.4 mg/dL — AB (ref 0.3–1.2)
BUN: 10 mg/dL (ref 6–20)
CALCIUM: 8.1 mg/dL — AB (ref 8.9–10.3)
CO2: 20 mmol/L — AB (ref 22–32)
CREATININE: 1.1 mg/dL (ref 0.61–1.24)
Chloride: 100 mmol/L — ABNORMAL LOW (ref 101–111)
GFR calc Af Amer: >60 mL/min (ref >60)
GFR calc non Af Amer: >60 mL/min (ref >60)
GLUCOSE: 160 mg/dL — AB (ref 65–99)
Potassium: 3.3 mmol/L — ABNORMAL LOW (ref 3.5–5.1)
SODIUM: 133 mmol/L — AB (ref 135–145)
TOTAL PROTEIN: 7.3 g/dL (ref 6.5–8.1)

## 2017-09-12 LAB — URINALYSIS, ROUTINE W REFLEX MICROSCOPIC
BACTERIA UA: NONE SEEN
BILIRUBIN URINE: NEGATIVE
GLUCOSE, UA: NEGATIVE mg/dL
Hgb urine dipstick: NEGATIVE
KETONES UR: NEGATIVE mg/dL
Leukocytes, UA: NEGATIVE
NITRITE: NEGATIVE
PH: 6 (ref 5.0–8.0)
PROTEIN: 30 mg/dL — AB
Specific Gravity, Urine: 1.016 (ref 1.005–1.030)

## 2017-09-12 LAB — CBC WITH DIFFERENTIAL/PLATELET
BASOS PCT: 0 %
Basophils Absolute: 0 10*3/uL (ref 0–0.1)
EOS ABS: 0 10*3/uL (ref 0–0.7)
Eosinophils Relative: 0 %
HEMATOCRIT: 30.1 % — AB (ref 40.0–52.0)
HEMOGLOBIN: 10.2 g/dL — AB (ref 13.0–18.0)
LYMPHS ABS: 0.3 10*3/uL — AB (ref 1.0–3.6)
Lymphocytes Relative: 20 %
MCH: 31.8 pg (ref 26.0–34.0)
MCHC: 34 g/dL (ref 32.0–36.0)
MCV: 93.5 fL (ref 80.0–100.0)
MONO ABS: 0.4 10*3/uL (ref 0.2–1.0)
MONOS PCT: 23 %
NEUTROS PCT: 57 %
Neutro Abs: 0.9 10*3/uL — ABNORMAL LOW (ref 1.4–6.5)
Platelets: 170 10*3/uL (ref 150–440)
RBC: 3.22 MIL/uL — ABNORMAL LOW (ref 4.40–5.90)
RDW: 19.6 % — AB (ref 11.5–14.5)
WBC: 1.5 10*3/uL — ABNORMAL LOW (ref 3.8–10.6)

## 2017-09-12 LAB — LACTIC ACID, PLASMA
LACTIC ACID, VENOUS: 4.9 mmol/L — AB (ref 0.5–1.9)
LACTIC ACID, VENOUS: 3.4 mmol/L — AB (ref 0.5–1.9)
Lactic Acid, Venous: 4.3 mmol/L (ref 0.5–1.9)

## 2017-09-12 LAB — MAGNESIUM: Magnesium: 1.4 mg/dL — ABNORMAL LOW (ref 1.7–2.4)

## 2017-09-12 LAB — PROTIME-INR
INR: 1.34
PROTHROMBIN TIME: 16.5 s — AB (ref 11.4–15.2)

## 2017-09-12 LAB — TROPONIN I: Troponin I: <0.03 ng/mL (ref <0.03)

## 2017-09-12 LAB — AMMONIA: AMMONIA: 20 umol/L (ref 9–35)

## 2017-09-12 LAB — LIPASE, BLOOD: Lipase: 30 U/L (ref 11–51)

## 2017-09-12 MED ORDER — PIPERACILLIN-TAZOBACTAM 3.375 G IVPB 30 MIN
3.3750 g | Freq: Once | INTRAVENOUS | Status: AC
  Administered 2017-09-12: 3.375 g via INTRAVENOUS
  Filled 2017-09-12: qty 50

## 2017-09-12 MED ORDER — IOPAMIDOL (ISOVUE-300) INJECTION 61%
30.0000 mL | Freq: Once | INTRAVENOUS | Status: AC | PRN
  Administered 2017-09-12: 30 mL via ORAL

## 2017-09-12 MED ORDER — ACETAMINOPHEN 325 MG PO TABS
650.0000 mg | ORAL_TABLET | Freq: Four times a day (QID) | ORAL | Status: DC | PRN
  Administered 2017-09-13: 650 mg via ORAL
  Filled 2017-09-12 (×2): qty 2

## 2017-09-12 MED ORDER — ADENOSINE 6 MG/2ML IV SOLN
6.0000 mg | Freq: Once | INTRAVENOUS | Status: AC
  Administered 2017-09-12: 6 mg via INTRAVENOUS

## 2017-09-12 MED ORDER — FENTANYL 50 MCG/HR TD PT72: 75.0000 ug | MEDICATED_PATCH | TRANSDERMAL | Status: DC

## 2017-09-12 MED ORDER — ONDANSETRON HCL 4 MG/2ML IJ SOLN
4.0000 mg | Freq: Four times a day (QID) | INTRAMUSCULAR | Status: DC | PRN
  Administered 2017-09-14 – 2017-09-15 (×2): 4 mg via INTRAVENOUS
  Filled 2017-09-12 (×2): qty 2

## 2017-09-12 MED ORDER — OXYCODONE HCL 5 MG PO TABS
10.0000 mg | ORAL_TABLET | Freq: Four times a day (QID) | ORAL | Status: DC | PRN
  Administered 2017-09-13: 10 mg via ORAL
  Filled 2017-09-12: qty 2

## 2017-09-12 MED ORDER — PROMETHAZINE HCL 25 MG PO TABS
25.0000 mg | ORAL_TABLET | Freq: Four times a day (QID) | ORAL | Status: DC | PRN
  Filled 2017-09-12 (×2): qty 1

## 2017-09-12 MED ORDER — MORPHINE SULFATE (PF) 4 MG/ML IV SOLN
4.0000 mg | INTRAVENOUS | Status: DC | PRN
  Administered 2017-09-12 – 2017-09-15 (×5): 4 mg via INTRAVENOUS
  Filled 2017-09-12 (×5): qty 1

## 2017-09-12 MED ORDER — POTASSIUM CHLORIDE 20 MEQ PO PACK
40.0000 meq | PACK | Freq: Once | ORAL | Status: DC
  Filled 2017-09-12: qty 2

## 2017-09-12 MED ORDER — PROMETHAZINE HCL 25 MG/ML IJ SOLN
6.2500 mg | Freq: Four times a day (QID) | INTRAMUSCULAR | Status: DC | PRN
  Administered 2017-09-13 (×2): 6.25 mg via INTRAVENOUS
  Filled 2017-09-12 (×2): qty 1

## 2017-09-12 MED ORDER — POTASSIUM CHLORIDE IN NACL 20-0.9 MEQ/L-% IV SOLN
INTRAVENOUS | Status: DC
  Administered 2017-09-12 – 2017-09-15 (×4): via INTRAVENOUS
  Filled 2017-09-12 (×11): qty 1000

## 2017-09-12 MED ORDER — SUCRALFATE 1 GM/10ML PO SUSP
1.0000 g | Freq: Three times a day (TID) | ORAL | Status: DC
  Administered 2017-09-13 – 2017-09-16 (×13): 1 g via ORAL
  Filled 2017-09-12 (×13): qty 10

## 2017-09-12 MED ORDER — TEMAZEPAM 15 MG PO CAPS
15.0000 mg | ORAL_CAPSULE | Freq: Every evening | ORAL | Status: DC | PRN
  Administered 2017-09-13 – 2017-09-15 (×3): 15 mg via ORAL
  Filled 2017-09-12 (×3): qty 1

## 2017-09-12 MED ORDER — PREDNISONE 1 MG PO TABS
1.0000 mg | ORAL_TABLET | Freq: Every day | ORAL | Status: DC
  Administered 2017-09-13: 1 mg via ORAL
  Filled 2017-09-12: qty 1

## 2017-09-12 MED ORDER — ADENOSINE 6 MG/2ML IV SOLN
INTRAVENOUS | Status: AC
  Filled 2017-09-12: qty 4

## 2017-09-12 MED ORDER — ONDANSETRON HCL 4 MG PO TABS
4.0000 mg | ORAL_TABLET | Freq: Four times a day (QID) | ORAL | Status: DC | PRN
  Administered 2017-09-16: 4 mg via ORAL
  Filled 2017-09-12: qty 1

## 2017-09-12 MED ORDER — SUCRALFATE 1 G PO TABS
1.0000 g | ORAL_TABLET | Freq: Four times a day (QID) | ORAL | Status: DC
  Filled 2017-09-12: qty 1

## 2017-09-12 MED ORDER — ONDANSETRON HCL 4 MG PO TABS: 8.0000 mg | ORAL_TABLET | Freq: Three times a day (TID) | ORAL | Status: DC | PRN

## 2017-09-12 MED ORDER — LORAZEPAM 0.5 MG PO TABS: 0.5000 mg | ORAL_TABLET | Freq: Two times a day (BID) | ORAL | Status: DC | PRN

## 2017-09-12 MED ORDER — PANTOPRAZOLE SODIUM 40 MG IV SOLR
40.0000 mg | Freq: Every day | INTRAVENOUS | Status: DC
  Administered 2017-09-12 – 2017-09-14 (×3): 40 mg via INTRAVENOUS
  Filled 2017-09-12 (×3): qty 40

## 2017-09-12 MED ORDER — HEPARIN SODIUM (PORCINE) 5000 UNIT/ML IJ SOLN
5000.0000 [IU] | Freq: Three times a day (TID) | INTRAMUSCULAR | Status: DC
  Administered 2017-09-12 – 2017-09-15 (×8): 5000 [IU] via SUBCUTANEOUS
  Filled 2017-09-12 (×8): qty 1

## 2017-09-12 MED ORDER — PIPERACILLIN-TAZOBACTAM 3.375 G IVPB
3.3750 g | Freq: Three times a day (TID) | INTRAVENOUS | Status: DC
  Administered 2017-09-12 – 2017-09-16 (×11): 3.375 g via INTRAVENOUS
  Filled 2017-09-12 (×11): qty 50

## 2017-09-12 MED ORDER — ACETAMINOPHEN 650 MG RE SUPP
650.0000 mg | Freq: Four times a day (QID) | RECTAL | Status: DC | PRN
Start: 2017-09-12 — End: 2017-09-16
  Filled 2017-09-12: qty 1

## 2017-09-12 MED ORDER — LIDOCAINE-PRILOCAINE 2.5-2.5 % EX CREA
1.0000 "application " | TOPICAL_CREAM | Freq: Every day | CUTANEOUS | Status: DC | PRN
  Filled 2017-09-12: qty 5

## 2017-09-12 MED ORDER — MAGNESIUM SULFATE 2 GM/50ML IV SOLN
2.0000 g | Freq: Once | INTRAVENOUS | Status: AC
  Administered 2017-09-12: 2 g via INTRAVENOUS
  Filled 2017-09-12: qty 50

## 2017-09-12 MED ORDER — LORAZEPAM 2 MG/ML IJ SOLN
0.5000 mg | Freq: Two times a day (BID) | INTRAMUSCULAR | Status: DC | PRN
  Administered 2017-09-13 – 2017-09-16 (×5): 0.5 mg via INTRAVENOUS
  Filled 2017-09-12 (×5): qty 1

## 2017-09-12 MED ORDER — IOPAMIDOL (ISOVUE-300) INJECTION 61%
125.0000 mL | Freq: Once | INTRAVENOUS | Status: AC | PRN
  Administered 2017-09-12: 16:00:00 via INTRAVENOUS

## 2017-09-12 MED ORDER — SODIUM CHLORIDE 0.9 % IV BOLUS (SEPSIS)
30.0000 mL/kg | Freq: Once | INTRAVENOUS | Status: AC
  Administered 2017-09-12: 3537 mL via INTRAVENOUS

## 2017-09-12 MED ORDER — ADENOSINE 12 MG/4ML IV SOLN
INTRAVENOUS | Status: AC
  Filled 2017-09-12: qty 4

## 2017-09-12 MED ORDER — SODIUM CHLORIDE 0.9 % IV BOLUS (SEPSIS)
500.0000 mL | Freq: Once | INTRAVENOUS | Status: AC
  Administered 2017-09-12: 500 mL via INTRAVENOUS

## 2017-09-12 NOTE — Consult Note (Signed)
Surgical Consultation  09/12/2017  [name] is an 56 y.o. male.   Referring Physician: Salary  CC:abd pain  HPI: This a patient with both chronic and acute abdominal pain.  I was asked by prime doc to see the patient for this pain and for signs of pneumatosis on CT scan.  Patient is examined and interviewed in the first floor oncology ward with his wife present.  His wife answers most of the questions.  She gives a history of having been treated for diverticulitis for several months only to find out that he had colon cancer.  He was treated following a colostomy with chemotherapy but the cancer came back.  He had a bowel obstruction and experienced a small bowel resection.  More chemotherapy followed.  He is currently on an oral chemotherapeutic agent.  He was admitted to the hospital because of confusion and possible sepsis.  A CT scan done today suggested pneumatosis.  That pneumatosis however was present on a CT scan back in August 31 of this year and similar amounts. Currently the patient states that he has had ongoing abdominal pain for many many months but over the last 3-5 days it has been worsening.  He points to an area lateral in the abdomen lateral to the left lower quadrant colostomy.  He has had no emesis except the single emesis after taking p.o. contrast which she vomited.  He is not nauseated at this time.  His ostomy is functional. Patient's wife relates that in the past they have been unhappy with "[facility] surgical" care and had sought care at [facility] where all of his surgeries have been performed.  Patient's wife states that they would prefer to have surgery at [facility] if needed  Past Medical History:  Diagnosis Date  . Abdominal pain    mid abdomen  . Adiposity 03/21/2015  . Arthritis   . 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 Colon cancer ([place])   . 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 Extreme obesity 12/21/2012  . GERD (gastroesophageal reflux disease)   .  History of kidney stones   . History of transfusion   . Hypertension    has been off BP med x 6 months  . Iron deficiency anemia due to chronic blood loss 05/08/2015  . LBP (low back pain) 05/28/2014  . Neuropathy   . PONV (postoperative nausea and vomiting)   . Sarcoid 10/2012  . Skin cancer    "it flairs up with sarcoidosis"    Past Surgical History:  Procedure Laterality Date  . CARDIAC CATHETERIZATION    . FLEXIBLE SIGMOIDOSCOPY N/A 05/07/2015   Procedure: FLEXIBLE SIGMOIDOSCOPY;  Surgeon: Laurence Spates, MD;  Location: [facility] ENDOSCOPY;  Service: Endoscopy;  Laterality: N/A;  . LAPAROSCOPIC PARTIAL COLECTOMY N/A 05/07/2015   Procedure: LAPAROSCOPIC ASSITED PARTIAL COLECTOMY WITH COLOSTOMY, SMALL BOWEL RESECTION, EXCISION OF PERITONEAL NODULE;  Surgeon: [name], MD;  Location: [facility] ORS;  Service: General;  Laterality: N/A;  . LAPAROTOMY N/A 08/10/2016   Procedure: EXPLORATORY LAPAROTOMY small bowel resection abdominal wall resection partial omentectomy;  Surgeon: Excell Seltzer, MD;  Location: [facility] ORS;  Service: General;  Laterality: N/A;  . PORTACATH PLACEMENT Right 05/27/2015   Procedure: INSERTION PORT-A-CATH;  Surgeon: [name], MD;  Location: [facility] OR[facility];  Service: General;  Laterality: Right;    Family History  Problem Relation Age of Onset  . Arthritis Mother   . Heart disease Mother   . Stroke Father   . Prostate cancer Neg  Hx   . Bladder Cancer Neg Hx     Social History:  reports that he is a non-smoker but has been exposed to tobacco smoke. he has never used smokeless tobacco. He reports that he does not drink alcohol or use drugs.  Allergies:  Allergies  Allergen Reactions  . Versed [Midazolam] Nausea And Vomiting  . Gabapentin Other (See Comments)    Swallowing problems  . Paba Derivatives Nausea And Vomiting    Pt states he is allergic to unknown anesthesia. Pt has nausea and vomiting with anesthesia.   . Benzyl Benzoate Nausea And Vomiting    Pt states  he is allergic to unknown anesthesia. Pt has nausea and vomiting with anesthesia.     Medications reviewed.   Review of Systems:   Review of Systems  Unable to perform ROS: Medical condition   Not able to perform an adequate review of systems as the patient is somnolent but oriented but prefers not to answer questions and allow his wife to answer for him.  Physical Exam:  BP (!) 147/79   Pulse (!) 102   Temp 98.9 F (37.2 C) (Oral)   Resp 20   Ht _0  (1.803 m)   Wt 260 lb (117.9 kg)   SpO2 100%   BMI 36.26 kg/m   Physical Exam  Constitutional: He is oriented to person, place, and time. No distress.  Obviously jaundiced and pale  HENT:  Head: Normocephalic and atraumatic.  Eyes: Pupils are equal, round, and reactive to light. Right eye exhibits no discharge. Left eye exhibits no discharge. Scleral icterus is present.  Pulmonary/Chest: Effort normal. No respiratory distress.  Abdominal: Soft. He exhibits no distension and no mass. There is tenderness. There is guarding. There is no rebound.  Mild guarding and tenderness lateral to the left lower quadrant colostomy.  Colostomy is functional and pink.  The right side and midline of the abdomen is nontender he is obese.  No other peritoneal signs.  Musculoskeletal: He exhibits edema. He exhibits no tenderness.  Neurological: He is alert and oriented to person, place, and time.  Skin: Skin is warm and dry. No rash noted. He is not diaphoretic. No erythema.  Jaundiced pale  Vitals reviewed.     Results for orders placed or performed during the hospital encounter of 09/12/17 (from the past 48 hour(s))  Comprehensive metabolic panel     Status: Abnormal   Collection Time: 09/12/17  2:37 PM  Result Value Ref Range   Sodium 133 (L) 135 - 145 mmol/L   Potassium 3.3 (L) 3.5 - 5.1 mmol/L   Chloride 100 (L) 101 - 111 mmol/L   CO2 20 (L) 22 - 32 mmol/L   Glucose, Bld 160 (H) 65 - 99 mg/dL   BUN 10 6 - 20 mg/dL   Creatinine,  Ser 1.10 0.61 - 1.24 mg/dL   Calcium 8.1 (L) 8.9 - 10.3 mg/dL   Total Protein 7.3 6.5 - 8.1 g/dL   Albumin 3.6 3.5 - 5.0 g/dL   AST 36 15 - 41 U/L   ALT 24 17 - 63 U/L   Alkaline Phosphatase 117 38 - 126 U/L   Total Bilirubin 3.4 (H) 0.3 - 1.2 mg/dL   GFR calc non Af Amer >60 >60 mL/min   GFR calc Af Amer >60 >60 mL/min    Comment: (NOTE) The eGFR has been calculated using the CKD EPI equation. This calculation has not been validated in all clinical situations. eGFR's persistently <60 mL/min  signify possible Chronic Kidney Disease.    Anion gap 13 5 - 15    Comment: Performed at [facility], [address] [place], [place] [address]  CBC with Differential     Status: Abnormal   Collection Time: 09/12/17  2:37 PM  Result Value Ref Range   WBC 1.5 (L) 3.8 - 10.6 K/uL   RBC 3.22 (L) 4.40 - 5.90 MIL/uL   Hemoglobin 10.2 (L) 13.0 - 18.0 g/dL   HCT 30.1 (L) 40.0 - 52.0 %   MCV 93.5 80.0 - 100.0 fL   MCH 31.8 26.0 - 34.0 pg   MCHC 34.0 32.0 - 36.0 g/dL   RDW 19.6 (H) 11.5 - 14.5 %   Platelets 170 150 - 440 K/uL   Neutrophils Relative % 57 %   Neutro Abs 0.9 (L) 1.4 - 6.5 K/uL   Lymphocytes Relative 20 %   Lymphs Abs 0.3 (L) 1.0 - 3.6 K/uL   Monocytes Relative 23 %   Monocytes Absolute 0.4 0.2 - 1.0 K/uL   Eosinophils Relative 0 %   Eosinophils Absolute 0.0 0 - 0.7 K/uL   Basophils Relative 0 %   Basophils Absolute 0.0 0 - 0.1 K/uL    Comment: Performed at [facility], [address] [place], [place] [address]  Protime-INR     Status: Abnormal   Collection Time: 09/12/17  2:37 PM  Result Value Ref Range   Prothrombin Time 16.5 (H) 11.4 - 15.2 seconds   INR 1.34     Comment: Performed at [facility], [address] [place], [place] [address]  Lipase, blood     Status: None   Collection Time: 09/12/17  2:37 PM  Result Value Ref Range   Lipase 30 11 - 51 U/L    Comment: Performed at [facility], [address] [place],  [place] [address]  Troponin I     Status: None   Collection Time: 09/12/17  2:37 PM  Result Value Ref Range   Troponin I <0.03 <0.03 ng/mL    Comment: Performed at [facility], [address] [place], [place] [address]  Magnesium     Status: Abnormal   Collection Time: 09/12/17  2:37 PM  Result Value Ref Range   Magnesium 1.4 (L) 1.7 - 2.4 mg/dL    Comment: Performed at [facility], [address] [place], [place] [address]  Lactic acid, plasma     Status: Abnormal   Collection Time: 09/12/17  4:25 PM  Result Value Ref Range   Lactic Acid, Venous 4.3 (HH) 0.5 - 1.9 mmol/L    Comment: CRITICAL RESULT CALLED TO, READ BACK BY AND VERIFIED WITH STEVEN JONES ON 09/12/17 AT 1530 [facility] Performed at [facility], [address] [place],  [address]   Urinalysis, Routine w reflex microscopic     Status: Abnormal   Collection Time: 09/12/17  4:45 PM  Result Value Ref Range   Color, Urine AMBER (A) YELLOW    Comment: BIOCHEMICALS MAY BE AFFECTED BY COLOR   APPearance CLEAR (A) CLEAR   Specific Gravity, Urine 1.016 1.005 - 1.030   pH 6.0 5.0 - 8.0   Glucose, UA NEGATIVE NEGATIVE mg/dL   Hgb urine dipstick NEGATIVE NEGATIVE   Bilirubin Urine NEGATIVE NEGATIVE   Ketones, ur NEGATIVE NEGATIVE mg/dL   Protein, ur 30 (A) NEGATIVE mg/dL   Nitrite NEGATIVE NEGATIVE   Leukocytes, UA NEGATIVE NEGATIVE   RBC / HPF 0-5 0 -  5 RBC/hpf   WBC, UA 0-5 0 - 5 WBC/hpf   Bacteria, UA NONE SEEN NONE SEEN   Squamous Epithelial / LPF 0-5 (A) NONE SEEN   Mucus PRESENT    Hyaline Casts, UA PRESENT     Comment: Performed at [facility], [address] [place], [place] [address]  Lactic acid, plasma     Status: Abnormal   Collection Time: 09/12/17  5:00 PM  Result Value Ref Range   Lactic Acid, Venous 4.9 (HH) 0.5 - 1.9 mmol/L    Comment: CRITICAL RESULT CALLED TO, READ BACK BY AND VERIFIED WITH STEVEN JONESO N 09/12/17 AT 0254 [facility] Performed at [facility],  [address] [place], [place] [address]    Ct Abdomen Pelvis W Contrast  Result Date: 09/12/2017 CLINICAL DATA:  Left lower quadrant pain. Tachycardia. Stage 4 colon cancer. EXAM: CT ABDOMEN AND PELVIS WITH CONTRAST TECHNIQUE: Multidetector CT imaging of the abdomen and pelvis was performed using the standard protocol following bolus administration of intravenous contrast. CONTRAST:  <See Chart> ISOVUE-300 IOPAMIDOL (ISOVUE-300) INJECTION 61% COMPARISON:  05/13/2017 FINDINGS: Lower chest: Bibasilar lung nodules/metastasis. An index 1.3 cm right lower lobe nodule on image 2/series 4 is enlarged from 1.0 cm on the prior (when remeasured). Mild cardiomegaly, without pericardial or pleural effusion. Distal esophageal wall thickening is again identified. Hepatobiliary: Moderate hepatic steatosis. Hepatic metastasis. Index high right hepatic lobe lesion measures 3.2 cm today versus 3.6 cm on the prior. High right hepatic lobe 3.1 cm lesion image 20/series 2 measured 3.5 cm on the prior exam. Normal gallbladder, without biliary ductal dilatation. Pancreas: Fatty replacement throughout the pancreas. Spleen: Normal in size, without focal abnormality. Adrenals/Urinary Tract: Normal adrenal glands. Bilateral renal collecting system calculi. An interpolar left renal too small to characterize lesion is likely a cyst. No hydronephrosis. Normal urinary bladder. Stomach/Bowel: Normal stomach, without wall thickening. Hartmann's pouch. Descending colostomy. 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 Mid abdominal small bowel loops, including in the region of the parastomal hernia, demonstrate increased pneumatosis, including on image 47/series 2. There is extraluminal mesenteric gas again identified, small volume including on image 62/series 2. Vascular/Lymphatic: Aortic atherosclerosis. Small bowel mesenteric adenopathy, including at 1.8 x 1.5 cm today versus 2.7 x 2.1 cm on the prior. No pelvic sidewall  adenopathy. Reproductive: Normal prostate. Other: No significant free fluid. Extensive omental/ peritoneal metastasis. Index left pelvic lesion measures 3.2 x 2.2 cm on image 75/series 2. Compare 2.8 x 2.0 cm on the prior. Anterior abdominal wall metastasis measures 4.7 x 3.0 cm on image 56/series 2 versus 4.4 x 1.8 cm on the prior. Musculoskeletal: No acute osseous abnormality. IMPRESSION: 1. Progression of metastatic disease within the lungs and peritoneum. 2. Mild improvement in hepatic and nodal metastasis. 3. 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 Progression of small bowel pneumatosis with mesenteric air since 05/13/2017. This is again suspicious for small bowel ischemia. 4. Distal esophageal wall thickening suggests esophagitis. 5. Bilateral nephrolithiasis. Electronically Signed   By: Abigail Miyamoto M.D.   On: 09/12/2017 17:07   Dg Chest Port 1 View  Result Date: 09/12/2017 CLINICAL DATA:  Diaphoresis. Altered mental status. Left lower quadrant abdominal pain. EXAM: PORTABLE CHEST 1 VIEW COMPARISON:  Chest, abdomen pelvis CT dated 05/13/2017. FINDINGS: Poor inspiration. Grossly normal sized heart. Stable mild linear scarring at the left lung base. The previously demonstrated pulmonary nodules are not well visualized today. Right jugular porta catheter tip in the inferior aspect of the  superior vena cava. Unremarkable bones. IMPRESSION: No acute abnormality. Electronically Signed   By: Claudie Revering M.D.   On: 09/12/2017 15:16    Assessment/Plan:  This a patient with known metastatic colon cancer.  It is metastatic to lung peritoneum omentum and liver.  He presents with chronic and acute abdominal pain and recurrent pneumatosis.  This pneumatosis was present on CT scan done in August of this year and was treated conservatively presumably secondary to the patient's severe disease.  He was not admitted to the hospital at that time.  This time he is in the hospital secondary  to this pneumatosis and his abdominal pain.  He also is experiencing signs of sepsis with confusion.  He is obviously jaundiced with multiple electrolyte abnormalities abnormal INR abnormal bilirubin and severe anemia and leukopenia.  I discussed with the patient and his wife the fact that normally pneumatosis would be considered a surgical emergency.  In this patient who is not yet on hospice and is undergoing ongoing chemotherapy under most circumstances I would consider surgery in this patient however with the knowledge of his extensive metastatic disease and disease of the omentum and presumably small bowel this would not be a very good surgical candidate or outcome.  The risk of fistula was discussed.  During this discussion the patient's wife relayed to me what she had told me about [facility] surgical and her lack of confidence in the [facility] surgical.  Was at that point that I notified her of [facility] new name and offered to assist in Mr. Toney's care and offered transfer to [facility] who has been her surgical provider in the past.  I believe that this is her decision should surgery be necessary.  Patient has been placed on antibiotics and attempts at correcting his multiple electrolyte abnormalities are in process.  This includes treatment of his code sepsis and lactate levels.  We will begin available if surgical needs arise however in this dire situation surgery may not be in the patient's best interest and family prefers to be treated by [facility] and [employer].  Will be available as needed  Florene Glen, MD, FACS

## 2017-09-12 NOTE — ED Triage Notes (Signed)
Diaphoretic. LLQ pain. AMS per wife. Is disoriented at this time. Tachy, low grade temp, soft bp. Stage 4 CA

## 2017-09-12 NOTE — ED Notes (Signed)
Pt drinking CT contrast.  Wife pushed call light when patient vomited contrast.  Patient's HR at 220 BPM.  Dr. Jacqualine Code notified and at bedside.  Patient placed on pads.  6mg  adenosine ordered and given emergently.

## 2017-09-12 NOTE — Telephone Encounter (Signed)
Patient is currently being seen in ER.

## 2017-09-12 NOTE — ED Notes (Signed)
Date and time results received: 09/12/17 1745 (use smartphrase ".now" to insert current time)  Test: Lactic acid Critical Value: 4.9  Name of Provider Notified: Dr. Jacqualine Code  Orders Received? Or Actions Taken?: No new orders at this time.

## 2017-09-12 NOTE — ED Provider Notes (Addendum)
[facility] Emergency Department Provider Note   ____________________________________________   First MD Initiated Contact with Patient 09/12/17 1422     (approximate)  I have reviewed the triage vital signs and the nursing notes.   HISTORY  Chief Complaint Altered Mental Status  EM caveat: Patient with slight confusion, inability to recall  HPI ASAHD CAN is a 56 y.o. male history of colon cancer, previous bowel surgeries, start complained to his wife today of abdominal pain, worsened throughout the day, primarily in the left lower abdomen.  She noticed that he has been vomiting several times this morning, feeling nauseated, complaining of pain.  No headache, denies neck pain.  Wife reports she seemed to be slightly confused earlier.  At present the patient reports pain in the left side the abdomen.  They have noticed that he has not had much output from his ostomy today which is a change.  He has a history of previous bowel obstructions.  Is also been sweating felt warm and they suspect having fever    Past Medical History:  Diagnosis Date  . Abdominal pain    mid abdomen  . Adiposity 03/21/2015  . Arthritis   . 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 Colon cancer ([place])   . 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 Extreme obesity 12/21/2012  . GERD (gastroesophageal reflux disease)   . History of kidney stones   . History of transfusion   . Hypertension    has been off BP med x 6 months  . Iron deficiency anemia due to chronic blood loss 05/08/2015  . LBP (low back pain) 05/28/2014  . Neuropathy   . PONV (postoperative nausea and vomiting)   . Sarcoid 10/2012  . Skin cancer    "it flairs up with sarcoidosis"    Patient Active Problem List   Diagnosis Date Noted  . Dehydration 11/01/2016  . Counseling regarding goals of care 09/17/2016  . Bilateral sciatica   . 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 Colostomy in place [facility]) 08/07/2016  . 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 Iron deficiency anemia due to chronic blood loss 05/08/2015  . Morbid obesity ([place]) 05/07/2015  . Nausea and vomiting 05/07/2015  . Pulmonary sarcoidosis ([place])   . Abdominal pain, acute 05/05/2015  . Leukocytosis 05/05/2015  . Hypokalemia 05/05/2015  . HTN (hypertension) 03/21/2015  . 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 Low back pain 05/28/2014  . Dry eye 09/25/2013  . Besnier-Boeck disease ([place]) 12/21/2012    Past Surgical History:  Procedure Laterality Date  . CARDIAC CATHETERIZATION    . FLEXIBLE SIGMOIDOSCOPY N/A 05/07/2015   Procedure: FLEXIBLE SIGMOIDOSCOPY;  Surgeon: Laurence Spates, MD;  Location: [facility] ENDOSCOPY;  Service: Endoscopy;  Laterality: N/A;  . LAPAROSCOPIC PARTIAL COLECTOMY N/A 05/07/2015   Procedure: LAPAROSCOPIC ASSITED PARTIAL COLECTOMY WITH COLOSTOMY, SMALL BOWEL RESECTION, EXCISION OF PERITONEAL NODULE;  Surgeon: [name], MD;  Location: [facility] ORS;  Service: General;  Laterality: N/A;  . LAPAROTOMY N/A 08/10/2016   Procedure: EXPLORATORY LAPAROTOMY small bowel resection abdominal wall resection partial omentectomy;  Surgeon: Excell Seltzer, MD;  Location: [facility] ORS;  Service: General;  Laterality: N/A;  . PORTACATH PLACEMENT Right 05/27/2015   Procedure: INSERTION PORT-A-CATH;  Surgeon: [name], MD;  Location: [facility] ORS;  Service: General;  Laterality: Right;    Prior to Admission medications   Medication Sig Start Date End Date Taking?  Authorizing Provider  diphenoxylate-atropine (LOMOTIL) 2.5-0.025 MG tablet TAKE 1 TABLET BY MOUTH 4 TIMES DAILY AS NEEDED FOR DIARRHEA OR LOOSE STOOLS Patient not taking: Reported on 07/18/2017 05/23/17   Cammie Sickle, MD  fentaNYL (DURAGESIC - DOSED MCG/HR) 75 MCG/HR Place 1 patch (75 mcg total) onto the skin every 3 (three) days. 09/07/17   Verlon Au, NP    lidocaine-prilocaine (EMLA) cream Apply 1 application topically as needed. Apply small amount to port site at least 1 hour prior to it being accessed, cover with plastic wrap 06/20/17   Cammie Sickle, MD  loperamide (IMODIUM A-D) 2 MG tablet Take 2 mg by mouth 4 (four) times daily as needed for diarrhea or loose stools.    [provider]  LORazepam (ATIVAN) 0.5 MG tablet Take every 8 hours as needed for nausea/anxiety. 07/19/17   Cammie Sickle, MD  Nystatin POWD Apply to bilateral abdominal skin folds TID or as needed. Make sure skin is completely dry. 07/18/17   Cammie Sickle, MD  omeprazole (PRILOSEC) 20 MG capsule Take 1 capsule (20 mg total) by mouth 2 (two) times daily before a meal. 08/15/17   Cammie Sickle, MD  ondansetron (ZOFRAN) 8 MG tablet Take 1 tablet (8 mg total) by mouth every 8 (eight) hours as needed for nausea or vomiting. 08/30/17   Cammie Sickle, MD  Oxycodone HCl 10 MG TABS Take 1 tablet (10 mg total) by mouth every 6 (six) hours as needed (severe pain). 09/07/17   Verlon Au, NP  predniSONE (DELTASONE) 1 MG tablet Crush 1 tablet daily and mix with stoma paste and apply to wound 07/13/17   Cammie Sickle, MD  prochlorperazine (COMPAZINE) 10 MG tablet Take 1 tablet (10 mg total) by mouth every 6 (six) hours as needed for nausea or vomiting. 06/20/17   Cammie Sickle, MD  promethazine (PHENERGAN) 25 MG tablet Take 1 tablet (25 mg total) by mouth every 6 (six) hours as needed. 06/24/17   Cammie Sickle, MD  sucralfate (CARAFATE) 1 g tablet Take 1 tablet (1 g total) by mouth 4 (four) times daily. 06/15/17   Cammie Sickle, MD  trifluridine-tipiracil (LONSURF) 20-8.19 MG tablet Take 4 tablets (80 mg trifluridine) by mouth two times daily after a meal. 1 hr after AM & PM meals on days 1-5, 8-12. Repeat every 28day 07/04/17   Cammie Sickle, MD  zolpidem (AMBIEN) 10 MG tablet Take 10-15 mg by mouth at  bedtime as needed for sleep.    [provider]    Allergies Versed [midazolam]; Gabapentin; Paba derivatives; and Benzyl benzoate  Family History  Problem Relation Age of Onset  . Arthritis Mother   . Heart disease Mother   . Stroke Father   . Prostate cancer Neg Hx   . Bladder Cancer Neg Hx     Social History Social History   Tobacco Use  . Smoking status: Passive Smoke Exposure - Never Smoker  . Smokeless tobacco: Never Used  Substance Use Topics  . Alcohol use: No  . Drug use: No    Review of Systems  EM caveat  ____________________________________________   PHYSICAL EXAM:  VITAL SIGNS: ED Triage Vitals  Enc Vitals Group     BP 09/12/17 1414 (!) 100/53     Pulse Rate 09/12/17 1414 (!) 144     Resp 09/12/17 1414 (!) 22     Temp 09/12/17 1414 99.5 F (37.5 C)  Temp Source 09/12/17 1414 Oral     SpO2 09/12/17 1414 98 %     Weight 09/12/17 1421 260 lb (117.9 kg)     Height 09/12/17 1421 5\' 11"  (1.803 m)     Head Circumference --      Peak Flow --      Pain Score 09/12/17 1420 6     Pain Loc --      Pain Edu? --      Excl. in [place]? --     Constitutional: Alert and oriented but his cognition seems slightly slow, he is oriented to person and place but not to today's date. Eyes: Conjunctivae are normal. Head: Atraumatic. Nose: No congestion/rhinnorhea. Mouth/Throat: Mucous membranes are quite dry Neck: No stridor.  No meningismus Cardiovascular: Cardiac rate, regular rhythm. Grossly normal heart sounds.  Good peripheral circulation. Respiratory: Normal respiratory effort.  No retractions. Lungs CTAB. Gastrointestinal: Soft and moderate to severely tender in the left mid to left lower quadrant.  Ostomy has no output at this time, wife replaces the bag a few hours ago. Musculoskeletal: No lower extremity tenderness nor edema. Neurologic: Slightly soft, but otherwise normal speech.  Language is clear.  His cognition seems slightly slowed in general.   No gross focal neurologic deficits are appreciated.  Moves all extremities commands.  No focal weakness.  Normal smile.  Skin:  Skin is warm, slightly sweaty and intact. No rash noted. Psychiatric: Mood and affect are flat  ____________________________________________   LABS (all labs ordered are listed, but only abnormal results are displayed)  Labs Reviewed  COMPREHENSIVE METABOLIC PANEL - Abnormal; Notable for the following components:      Result Value   Sodium 133 (*)    Potassium 3.3 (*)    Chloride 100 (*)    CO2 20 (*)    Glucose, Bld 160 (*)    Calcium 8.1 (*)    Total Bilirubin 3.4 (*)    All other components within normal limits  LACTIC ACID, PLASMA - Abnormal; Notable for the following components:   Lactic Acid, Venous 4.3 (*)    All other components within normal limits  CBC WITH DIFFERENTIAL/PLATELET - Abnormal; Notable for the following components:   WBC 1.5 (*)    RBC 3.22 (*)    Hemoglobin 10.2 (*)    HCT 30.1 (*)    RDW 19.6 (*)    Neutro Abs 0.9 (*)    Lymphs Abs 0.3 (*)    All other components within normal limits  PROTIME-INR - Abnormal; Notable for the following components:   Prothrombin Time 16.5 (*)    All other components within normal limits  URINALYSIS, ROUTINE W REFLEX MICROSCOPIC - Abnormal; Notable for the following components:   Color, Urine AMBER (*)    APPearance CLEAR (*)    Protein, ur 30 (*)    Squamous Epithelial / LPF 0-5 (*)    All other components within normal limits  MAGNESIUM - Abnormal; Notable for the following components:   Magnesium 1.4 (*)    All other components within normal limits  LACTIC ACID, PLASMA - Abnormal; Notable for the following components:   Lactic Acid, Venous 4.9 (*)    All other components within normal limits  CULTURE, BLOOD (ROUTINE X 2)  CULTURE, BLOOD (ROUTINE X 2)  URINE CULTURE  LIPASE, BLOOD  TROPONIN I   ____________________________________________  EKG  Reviewed and are by me at  1225 Heart rate 130 QRS 90 QTC 480 Sinus tachycardia, mild ST  depression in V2 and V3, there are multiple areas of artifact, no obvious ischemia except for some possible ST depression in V2 and V3.  Of note the patient denies chest pain  Second EKG performed at 1540 demonstrates a heart rate of 190 with SVT, more notable ST depression, possible subendocardial ischemic changes  3rd EKG reviewed and interpreted by me at 1545, heart rate 120 Cures 100 QTC 400 Normal sinus rhythm, ongoing about 1 mm ST depression noted in V3 and V4, no ST elevation.  Possible subendocardial ischemia again noted ____________________________________________  RADIOLOGY  Ct Abdomen Pelvis W Contrast  Result Date: 09/12/2017 CLINICAL DATA:  Left lower quadrant pain. Tachycardia. Stage 4 colon cancer. EXAM: CT ABDOMEN AND PELVIS WITH CONTRAST TECHNIQUE: Multidetector CT imaging of the abdomen and pelvis was performed using the standard protocol following bolus administration of intravenous contrast. CONTRAST:  <See Chart> ISOVUE-300 IOPAMIDOL (ISOVUE-300) INJECTION 61% COMPARISON:  05/13/2017 FINDINGS: Lower chest: Bibasilar lung nodules/metastasis. An index 1.3 cm right lower lobe nodule on image 2/series 4 is enlarged from 1.0 cm on the prior (when remeasured). Mild cardiomegaly, without pericardial or pleural effusion. Distal esophageal wall thickening is again identified. Hepatobiliary: Moderate hepatic steatosis. Hepatic metastasis. Index high right hepatic lobe lesion measures 3.2 cm today versus 3.6 cm on the prior. High right hepatic lobe 3.1 cm lesion image 20/series 2 measured 3.5 cm on the prior exam. Normal gallbladder, without biliary ductal dilatation. Pancreas: Fatty replacement throughout the pancreas. Spleen: Normal in size, without focal abnormality. Adrenals/Urinary Tract: Normal adrenal glands. Bilateral renal collecting system calculi. An interpolar left renal too small to characterize lesion is likely  a cyst. No hydronephrosis. Normal urinary bladder. Stomach/Bowel: Normal stomach, without wall thickening. Hartmann's pouch. Descending colostomy. 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 Mid abdominal small bowel loops, including in the region of the parastomal hernia, demonstrate increased pneumatosis, including on image 47/series 2. There is extraluminal mesenteric gas again identified, small volume including on image 62/series 2. Vascular/Lymphatic: Aortic atherosclerosis. Small bowel mesenteric adenopathy, including at 1.8 x 1.5 cm today versus 2.7 x 2.1 cm on the prior. No pelvic sidewall adenopathy. Reproductive: Normal prostate. Other: No significant free fluid. Extensive omental/ peritoneal metastasis. Index left pelvic lesion measures 3.2 x 2.2 cm on image 75/series 2. Compare 2.8 x 2.0 cm on the prior. Anterior abdominal wall metastasis measures 4.7 x 3.0 cm on image 56/series 2 versus 4.4 x 1.8 cm on the prior. Musculoskeletal: No acute osseous abnormality. IMPRESSION: 1. Progression of metastatic disease within the lungs and peritoneum. 2. Mild improvement in hepatic and nodal metastasis. 3. 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 Progression of small bowel pneumatosis with mesenteric air since 05/13/2017. This is again suspicious for small bowel ischemia. 4. Distal esophageal wall thickening suggests esophagitis. 5. Bilateral nephrolithiasis. Electronically Signed   By: Abigail Miyamoto M.D.   On: 09/12/2017 17:07   Dg Chest Port 1 View  Result Date: 09/12/2017 CLINICAL DATA:  Diaphoresis. Altered mental status. Left lower quadrant abdominal pain. EXAM: PORTABLE CHEST 1 VIEW COMPARISON:  Chest, abdomen pelvis CT dated 05/13/2017. FINDINGS: Poor inspiration. Grossly normal sized heart. Stable mild linear scarring at the left lung base. The previously demonstrated pulmonary nodules are not well visualized today. Right jugular porta catheter tip in the  inferior aspect of the superior vena cava. Unremarkable bones. IMPRESSION: No acute abnormality. Electronically Signed   By: Claudie Revering M.D.   On: 09/12/2017 15:16    ____________________________________________  PROCEDURES  Procedure(s) performed: None  Procedures  Critical Care performed: Yes, see critical care note(s)  CRITICAL CARE Performed by: Delman Kitten   Total critical care time: 55 minutes  Critical care time was exclusive of separately billable procedures and treating other patients.  Critical care was necessary to treat or prevent imminent or life-threatening deterioration.  Critical care was time spent personally by me on the following activities: development of treatment plan with patient and/or surrogate as well as nursing, discussions with consultants, evaluation of patient's response to treatment, examination of patient, obtaining history from patient or surrogate, ordering and performing treatments and interventions, ordering and review of laboratory studies, ordering and review of radiographic studies, pulse oximetry and re-evaluation of patient's condition.  ____________________________________________   INITIAL IMPRESSION / ASSESSMENT AND PLAN / ED COURSE  Pertinent labs & imaging results that were available during my care of the patient were reviewed by me and considered in my medical decision making (see chart for details).  For evaluation of increasing weakness, abdominal pain today.  Decreased ostomy output.  Notable left-sided abdominal pain.  He has slightly confused, weak, diaphoretic, and generally ill-appearing.  Also notably febrile with a low white count.  Clinical Course as of Sep 13 2047  Mon Sep 12, 2017  1547 Patient began drinking oral contrast when suddenly ill, vomited, and his heart rate noted to be 200.  He was noted to be in SVT, his blood pressure dropped to 18E systolic he became diaphoretic and felt extremely pale and weak.  During  this.  We gave him adenosine 6 mg after attempting vasovagal maneuvers without relief, after adenosine his heart rate has now returned to the 120s.  His blood pressure improved, and he is feeling improved.  Code sepsis has been page, additional fluids bolus has been ordered for elevated lactate.  Zosyn ordered.  [MQ]  1831 Dr. Jerelyn Charles with patient.  Patient's wife reports that they have been previously cared for at [facility] by [facility] surgery.  Discussed with her that currently we are covered by [facility] surgical.  They do report they had a previous bad experience with two  surgeons that at [facility] surgical, but both no longer work there.  They are agreeable to staying here in having general surgery evaluate and with Dr. Burt Knack or Dr. Rosana Hoes.  [MQ]    Clinical Course User Index [MQ] Delman Kitten, MD    ED Sepsis - Repeat Assessment   Performed at:    ----------------------------------------- 4:15 PM on 09/12/2017 -----------------------------------------    Last Vitals:    Blood pressure 112/65, pulse (!) 101, temperature 99.5 F (37.5 C), temperature source Oral, resp. rate (!) 30, height 5\' 11"  (1.803 m), weight 117.9 kg (260 lb), SpO2 95 %.  Heart:      Clear tones, tachycardia  Lungs:     Clear bilateral  Capillary Refill:   Less than 1 second  Peripheral Pulse (include location): Right radial   Skin (include color):   Warm pink well perfused  ----------------------------------------- 4:15 PM on 09/12/2017 -----------------------------------------  Patient appears to be improving.  Blood pressure still mild hypotension, heart rate much better, fever slowly improving.  He is certainly improved from where he was during his brief episode of SVT.  Have spoken to CT scan awaiting CT abdomen and pelvis, anticipate admission for concerns of severe sepsis, etiology not yet clear. ____________________________________________  ----------------------------------------- 6:08 PM on  09/12/2017 -----------------------------------------  Discussed case and care with Dr. Tama High of general  surgery.  Placed a consultation to general surgery, in addition the patient will be admitted to medicine due to his metastatic colon cancer and concerns for sepsis, severe.  He has responded somewhat to resuscitation but still has uptrending lactates which again are suspicious for ischemic bowel.  At this point, patient will be admitted to the medicine service with plan consultation urgently with general surgery who is currently finishing a case in the OR.    FINAL CLINICAL IMPRESSION(S) / ED DIAGNOSES  Final diagnoses:  Severe sepsis (HCC)  SVT (supraventricular tachycardia) (HCC)  Abdominal pain, unspecified abdominal location  Non-intractable vomiting with nausea, unspecified vomiting type  Small bowel ischemia (HCC)      NEW MEDICATIONS STARTED DURING THIS VISIT:  This SmartLink is deprecated. Use AVSMEDLIST instead to display the medication list for a patient.   Note:  This document was prepared using Dragon voice recognition software and may include unintentional dictation errors.     Delman Kitten, MD 09/12/17 Dionicia Abler    Delman Kitten, MD 09/12/17 2049

## 2017-09-12 NOTE — Progress Notes (Signed)
ANTIBIOTIC CONSULT NOTE - INITIAL  Pharmacy Consult for zosyn dosing  Indication: intra abdominal infection  Allergies  Allergen Reactions  . Versed [Midazolam] Nausea And Vomiting  . Gabapentin Other (See Comments)    Swallowing problems  . Paba Derivatives Nausea And Vomiting    Pt states he is allergic to unknown anesthesia. Pt has nausea and vomiting with anesthesia.   . Benzyl Benzoate Nausea And Vomiting    Pt states he is allergic to unknown anesthesia. Pt has nausea and vomiting with anesthesia.     Patient Measurements: Height: 5\' 11"  (180.3 cm) Weight: 260 lb (117.9 kg) IBW/kg (Calculated) : 75.3  Vital Signs: Temp: 99.5 F (37.5 C) (12/31 1414) Temp Source: Oral (12/31 1414) BP: 100/53 (12/31 1414) Pulse Rate: 122 (12/31 1445) Intake/Output from previous day: No intake/output data recorded. Intake/Output from this shift: No intake/output data recorded.  Labs: Recent Labs    09/12/17 1437  WBC 1.5*  HGB 10.2*  PLT 170  CREATININE 1.10   Estimated Creatinine Clearance: 96.7 mL/min (by C-G formula based on SCr of 1.1 mg/dL). No results for input(s): VANCOTROUGH, VANCOPEAK, VANCORANDOM, GENTTROUGH, GENTPEAK, GENTRANDOM, TOBRATROUGH, TOBRAPEAK, TOBRARND, AMIKACINPEAK, AMIKACINTROU, AMIKACIN in the last 72 hours.   Microbiology: No results found for this or any previous visit (from the past 720 hour(s)).  Medical History: Past Medical History:  Diagnosis Date  . Abdominal pain    mid abdomen  . Adiposity 03/21/2015  . Arthritis   . 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 Colon cancer ([place])   . 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 Extreme obesity 12/21/2012  . GERD (gastroesophageal reflux disease)   . History of kidney stones   . History of transfusion   . Hypertension    has been off BP med x 6 months  . Iron deficiency anemia due to chronic blood loss 05/08/2015  . LBP (low back pain) 05/28/2014  . Neuropathy   . PONV (postoperative nausea  and vomiting)   . Sarcoid 10/2012  . Skin cancer    "it flairs up with sarcoidosis"    Medications:   (Not in a hospital admission) Scheduled:  . adenosine      . adenosine       Infusions:  . piperacillin-tazobactam 3.375 g (09/12/17 1551)  . piperacillin-tazobactam (ZOSYN)  IV     PRN:  Anti-infectives (From admission, onward)   Start     Dose/Rate Route Frequency Ordered Stop   09/12/17 2200  piperacillin-tazobactam (ZOSYN) IVPB 3.375 g     3.375 g 12.5 mL/hr over 240 Minutes Intravenous Every 8 hours 09/12/17 1559     09/12/17 1545  piperacillin-tazobactam (ZOSYN) IVPB 3.375 g     3.375 g 100 mL/hr over 30 Minutes Intravenous  Once 09/12/17 1536       Assessment: Intra abdominal infection diagnosed by physician.   Goal of Therapy:  Eradication of bacteria   Plan:  monitor cultures and clinical resolution  Donna Christen Vale Mousseau 09/12/2017,3:59 PM

## 2017-09-12 NOTE — ED Notes (Addendum)
Pt arrives with wife. Weak to stretcher from wheelchair. Wife states pt was awake through out night c/o of abd pain. Pt has colostomy bag LLQ, changed this AM. Wife states decreased stool in bag. States changes bag q3days. Nothing noted in bag at this time. Pt appears weak. Answering questions appropriately. Colon CA with port in R chest. Tender to LLQ. Denies any problems with stoma. Pt is warm and diaphoretic. Wife states pt takes chemo pill. Pt moved from [place] to 13 for use of monitor.

## 2017-09-12 NOTE — H&P (Signed)
[facility] [place] NAME: [name]    MR#:  [id number]  DATE OF BIRTH:  [birth year]  DATE OF ADMISSION:  09/12/2017  PRIMARY CARE PHYSICIAN: [name], MD   REQUESTING/REFERRING PHYSICIAN:   CHIEF COMPLAINT:   Chief Complaint  Patient presents with  . Altered Mental Status    HISTORY OF PRESENT ILLNESS: [name]  is a 55 y.o. 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 intake, decreased ostomy output, dizziness, confusion, nausea but no emesis, pain is in his left lower quadrant, in the emergency room patient was found to have CT scan of the abdomen noted for increasing metastases in the lungs and peritoneum/parastomal hernia/increasing small bowel pneumatosis suspected secondary to ischemia/distal esophageal wall thickening concerning for esophagitis, EKG noted for anterior ST segment depression with heart rate in the 120s, in the emergency room patient is awake alert/oriented x3, wife at the bedside, ostomy noted with brown fecal material, per ED attending surgery has been notified-recommended hospitalist admission, noted diffuse abdominal tenderness on examination but no rebound/guarding, patient is now being admitted for acute on chronic abdominal pain most likely secondary to worsening stage IV colon cancer with associated carcinomatosis.  PAST MEDICAL HISTORY:   Past Medical History:  Diagnosis Date  . Abdominal pain    mid abdomen  . Adiposity 03/21/2015  . Arthritis   . 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 Colon cancer ([place])   . 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 Extreme obesity 12/21/2012  . GERD (gastroesophageal reflux disease)   . History of kidney stones   . History of transfusion   . Hypertension    has been off BP med x 6 months  . Iron deficiency anemia due to chronic blood loss 05/08/2015  . LBP  (low back pain) 05/28/2014  . Neuropathy   . PONV (postoperative nausea and vomiting)   . Sarcoid 10/2012  . Skin cancer    "it flairs up with sarcoidosis"    PAST SURGICAL HISTORY:  Past Surgical History:  Procedure Laterality Date  . CARDIAC CATHETERIZATION    . FLEXIBLE SIGMOIDOSCOPY N/A 05/07/2015   Procedure: FLEXIBLE SIGMOIDOSCOPY;  Surgeon: Laurence Spates, MD;  Location: [facility];  Service: Endoscopy;  Laterality: N/A;  . LAPAROSCOPIC PARTIAL COLECTOMY N/A 05/07/2015   Procedure: LAPAROSCOPIC ASSITED PARTIAL COLECTOMY WITH COLOSTOMY, SMALL BOWEL RESECTION, EXCISION OF PERITONEAL NODULE;  Surgeon: [name], MD;  Location: [facility];  Service: General;  Laterality: N/A;  . LAPAROTOMY N/A 08/10/2016   Procedure: EXPLORATORY LAPAROTOMY small bowel resection abdominal wall resection partial omentectomy;  Surgeon: Excell Seltzer, MD;  Location: [facility];  Service: General;  Laterality: N/A;  . PORTACATH PLACEMENT Right 05/27/2015   Procedure: INSERTION PORT-A-CATH;  Surgeon: [name], MD;  Location: [facility];  Service: General;  Laterality: Right;    SOCIAL HISTORY:  Social History   Tobacco Use  . Smoking status: Passive Smoke Exposure - Never Smoker  . Smokeless tobacco: Never Used  Substance Use Topics  . Alcohol use: No    FAMILY HISTORY:  Family History  Problem Relation Age of Onset  . Arthritis Mother   . Heart disease Mother   . Stroke Father   . Prostate cancer Neg Hx   . Bladder Cancer Neg Hx     DRUG ALLERGIES:  Allergies  Allergen Reactions  . Versed [Midazolam] Nausea And Vomiting  .  Gabapentin Other (See Comments)    Swallowing problems  . Paba Derivatives Nausea And Vomiting    Pt states he is allergic to unknown anesthesia. Pt has nausea and vomiting with anesthesia.   . Benzyl Benzoate Nausea And Vomiting    Pt states he is allergic to unknown anesthesia. Pt has nausea and vomiting with anesthesia.     REVIEW OF SYSTEMS:    CONSTITUTIONAL: Subjective fever, + fatigue/ weakness.  EYES: No blurred or double vision.  EARS, NOSE, AND THROAT: No tinnitus or ear pain.  Decreased p.o. intake RESPIRATORY: No cough, shortness of breath, wheezing or hemoptysis.  CARDIOVASCULAR: No chest pain, orthopnea, edema.  GASTROINTESTINAL: + nausea, no vomiting, diarrhea acute on chronic abdominal pain, decreased ostomy output.  GENITOURINARY: No dysuria, hematuria.  ENDOCRINE: No polyuria, nocturia,  HEMATOLOGY: No anemia, easy bruising or bleeding SKIN: No rash or lesion. MUSCULOSKELETAL: No joint pain or arthritis.   NEUROLOGIC: No tingling, numbness, weakness.  Confusion PSYCHIATRY: No anxiety or depression.   MEDICATIONS AT HOME:  Prior to Admission medications   Medication Sig Start Date End Date Taking? Authorizing Provider  fentaNYL (DURAGESIC - DOSED MCG/HR) 75 MCG/HR Place 1 patch (75 mcg total) onto the skin every 3 (three) days. 09/07/17  Yes Verlon Au, NP  lidocaine-prilocaine (EMLA) cream Apply 1 application topically as needed. Apply small amount to port site at least 1 hour prior to it being accessed, cover with plastic wrap 06/20/17  Yes Cammie Sickle, MD  loperamide (IMODIUM A-D) 2 MG tablet Take 2 mg by mouth 4 (four) times daily as needed for diarrhea or loose stools.   Yes [provider]  LORazepam (ATIVAN) 0.5 MG tablet Take every 8 hours as needed for nausea/anxiety. 07/19/17  Yes Cammie Sickle, MD  omeprazole (PRILOSEC) 20 MG capsule Take 1 capsule (20 mg total) by mouth 2 (two) times daily before a meal. 08/15/17  Yes Cammie Sickle, MD  ondansetron (ZOFRAN) 8 MG tablet Take 1 tablet (8 mg total) by mouth every 8 (eight) hours as needed for nausea or vomiting. 08/30/17  Yes Cammie Sickle, MD  Oxycodone HCl 10 MG TABS Take 1 tablet (10 mg total) by mouth every 6 (six) hours as needed (severe pain). 09/07/17  Yes Verlon Au, NP  predniSONE (DELTASONE) 1 MG  tablet Crush 1 tablet daily and mix with stoma paste and apply to wound 07/13/17  Yes Cammie Sickle, MD  promethazine (PHENERGAN) 25 MG tablet Take 1 tablet (25 mg total) by mouth every 6 (six) hours as needed. 06/24/17  Yes Cammie Sickle, MD  sucralfate (CARAFATE) 1 g tablet Take 1 tablet (1 g total) by mouth 4 (four) times daily. 06/15/17  Yes Cammie Sickle, MD  trifluridine-tipiracil (LONSURF) 20-8.19 MG tablet Take 4 tablets (80 mg trifluridine) by mouth two times daily after a meal. 1 hr after AM & PM meals on days 1-5, 8-12. Repeat every 28day 07/04/17  Yes Cammie Sickle, MD  zolpidem (AMBIEN) 10 MG tablet Take 10-15 mg by mouth at bedtime as needed for sleep.   Yes [provider]      PHYSICAL EXAMINATION:   VITAL SIGNS: Blood pressure 113/80, pulse 98, temperature 99.5 F (37.5 C), temperature source Oral, resp. rate 18, height 5\' 11"  (1.803 m), weight 117.9 kg (260 lb), SpO2 98 %.  GENERAL:  56 y.o.-year-old patient lying in the bed with no acute distress.  Morbidly obese, nontoxic-appearing EYES: Pupils equal, round, reactive  to light and accommodation. No scleral icterus. Extraocular muscles intact.  HEENT: Head atraumatic, normocephalic. Oropharynx and nasopharynx clear.  NECK:  Supple, no jugular venous distention. No thyroid enlargement, no tenderness.  LUNGS: Normal breath sounds bilaterally, no wheezing, rales,rhonchi or crepitation. No use of accessory muscles of respiration.  CARDIOVASCULAR: S1, S2 normal. No murmurs, rubs, or gallops.  ABDOMEN: Soft, diffuse abdominal tenderness, ostomy noted with fecal material within the ostomy bag, no guarding/rebound, hypoactive bowel sounds noted EXTREMITIES: No pedal edema, cyanosis, or clubbing.  NEUROLOGIC: Cranial nerves II through XII are intact. MAES. Gait not checked.  PSYCHIATRIC: The patient is alert and oriented x 3.  SKIN: No obvious rash, lesion, or ulcer.   LABORATORY PANEL:    CBC Recent Labs  Lab 09/12/17 1437  WBC 1.5*  HGB 10.2*  HCT 30.1*  PLT 170  MCV 93.5  MCH 31.8  MCHC 34.0  RDW 19.6*  LYMPHSABS 0.3*  MONOABS 0.4  EOSABS 0.0  BASOSABS 0.0   ------------------------------------------------------------------------------------------------------------------  Chemistries  Recent Labs  Lab 09/12/17 1437  NA 133*  K 3.3*  CL 100*  CO2 20*  GLUCOSE 160*  BUN 10  CREATININE 1.10  CALCIUM 8.1*  MG 1.4*  AST 36  ALT 24  ALKPHOS 117  BILITOT 3.4*   ------------------------------------------------------------------------------------------------------------------ estimated creatinine clearance is 96.7 mL/min (by C-G formula based on SCr of 1.1 mg/dL). ------------------------------------------------------------------------------------------------------------------ No results for input(s): TSH, T4TOTAL, T3FREE, THYROIDAB in the last 72 hours.  Invalid input(s): FREET3   Coagulation profile Recent Labs  Lab 09/12/17 1437  INR 1.34   ------------------------------------------------------------------------------------------------------------------- No results for input(s): DDIMER in the last 72 hours. -------------------------------------------------------------------------------------------------------------------  Cardiac Enzymes Recent Labs  Lab 09/12/17 1437  TROPONINI <0.03   ------------------------------------------------------------------------------------------------------------------ Invalid input(s): POCBNP  ---------------------------------------------------------------------------------------------------------------  Urinalysis    Component Value Date/Time   COLORURINE AMBER (A) 09/12/2017 1645   APPEARANCEUR CLEAR (A) 09/12/2017 1645   APPEARANCEUR Hazy (A) 04/12/2016 1402   LABSPEC 1.016 09/12/2017 1645   LABSPEC 1.015 11/06/2012 1925   PHURINE 6.0 09/12/2017 1645   GLUCOSEU NEGATIVE 09/12/2017 1645    GLUCOSEU Negative 11/06/2012 1925   HGBUR NEGATIVE 09/12/2017 1645   BILIRUBINUR NEGATIVE 09/12/2017 1645   BILIRUBINUR Negative 04/12/2016 1402   BILIRUBINUR Negative 11/06/2012 1925   KETONESUR NEGATIVE 09/12/2017 1645   PROTEINUR 30 (A) 09/12/2017 1645   UROBILINOGEN 0.2 05/05/2015 1719   NITRITE NEGATIVE 09/12/2017 1645   LEUKOCYTESUR NEGATIVE 09/12/2017 1645   LEUKOCYTESUR 1+ (A) 04/12/2016 1402   LEUKOCYTESUR Negative 11/06/2012 1925     RADIOLOGY: Ct Abdomen Pelvis W Contrast  Result Date: 09/12/2017 CLINICAL DATA:  Left lower quadrant pain. Tachycardia. Stage 4 colon cancer. EXAM: CT ABDOMEN AND PELVIS WITH CONTRAST TECHNIQUE: Multidetector CT imaging of the abdomen and pelvis was performed using the standard protocol following bolus administration of intravenous contrast. CONTRAST:  <See Chart> ISOVUE-300 IOPAMIDOL (ISOVUE-300) INJECTION 61% COMPARISON:  05/13/2017 FINDINGS: Lower chest: Bibasilar lung nodules/metastasis. An index 1.3 cm right lower lobe nodule on image 2/series 4 is enlarged from 1.0 cm on the prior (when remeasured). Mild cardiomegaly, without pericardial or pleural effusion. Distal esophageal wall thickening is again identified. Hepatobiliary: Moderate hepatic steatosis. Hepatic metastasis. Index high right hepatic lobe lesion measures 3.2 cm today versus 3.6 cm on the prior. High right hepatic lobe 3.1 cm lesion image 20/series 2 measured 3.5 cm on the prior exam. Normal gallbladder, without biliary ductal dilatation. Pancreas: Fatty replacement throughout the pancreas. Spleen: Normal in size, without focal abnormality. Adrenals/Urinary Tract: Normal  adrenal glands. Bilateral renal collecting system calculi. An interpolar left renal too small to characterize lesion is likely a cyst. No hydronephrosis. Normal urinary bladder. Stomach/Bowel: Normal stomach, without wall thickening. Hartmann's pouch. Descending colostomy. There is also a small bowel containing parastomal  hernia. Mid abdominal small bowel loops, including in the region of the parastomal hernia, demonstrate increased pneumatosis, including on image 47/series 2. There is extraluminal mesenteric gas again identified, small volume including on image 62/series 2. Vascular/Lymphatic: Aortic atherosclerosis. Small bowel mesenteric adenopathy, including at 1.8 x 1.5 cm today versus 2.7 x 2.1 cm on the prior. No pelvic sidewall adenopathy. Reproductive: Normal prostate. Other: No significant free fluid. Extensive omental/ peritoneal metastasis. Index left pelvic lesion measures 3.2 x 2.2 cm on image 75/series 2. Compare 2.8 x 2.0 cm on the prior. Anterior abdominal wall metastasis measures 4.7 x 3.0 cm on image 56/series 2 versus 4.4 x 1.8 cm on the prior. Musculoskeletal: No acute osseous abnormality. IMPRESSION: 1. Progression of metastatic disease within the lungs and peritoneum. 2. Mild improvement in hepatic and nodal metastasis. 3. 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 Progression of small bowel pneumatosis with mesenteric air since 05/13/2017. This is again suspicious for small bowel ischemia. 4. Distal esophageal wall thickening suggests esophagitis. 5. Bilateral nephrolithiasis. Electronically Signed   By: Abigail Miyamoto M.D.   On: 09/12/2017 17:07   Dg Chest Port 1 View  Result Date: 09/12/2017 CLINICAL DATA:  Diaphoresis. Altered mental status. Left lower quadrant abdominal pain. EXAM: PORTABLE CHEST 1 VIEW COMPARISON:  Chest, abdomen pelvis CT dated 05/13/2017. FINDINGS: Poor inspiration. Grossly normal sized heart. Stable mild linear scarring at the left lung base. The previously demonstrated pulmonary nodules are not well visualized today. Right jugular porta catheter tip in the inferior aspect of the superior vena cava. Unremarkable bones. IMPRESSION: No acute abnormality. Electronically Signed   By: Claudie Revering M.D.   On: 09/12/2017 15:16    EKG: Orders placed or performed  during the hospital encounter of 09/12/17  . EKG 12-Lead  . EKG 12-Lead  . ED EKG 12-Lead  . ED EKG 12-Lead  . ED EKG  . ED EKG  . EKG 12-Lead  . EKG 12-Lead  . EKG 12-Lead  . EKG 12-Lead    IMPRESSION AND PLAN: 1 acute on chronic abdominal pain Most likely secondary to worsening stage IV colon cancer with carcinomatosis Admit to regular nursing floor bed, n.p.o. for now, IV fluids for rehydration, general surgery consulted for abnormal CT scan findings, oncology consulted given cancer diagnosis, adult pain protocol initiated, and continue close medical monitoring  2 chronic stage IV colon cancer with carcinomatosis/pneumatosis Plan of care as stated above  3 acute hypokalemia/hypomagnesemia/hyponatremia Replete with IV fluids, p.o. potassium, IV magnesium, check magnesium/BMP in the morning  4 acute neutropenia Most likely secondary to cancer/chemotherapy Will place on neutropenic precautions and repeat CBC in the morning  5 acute confusion Resolved ?  Secondary to medication side effect from opioid/benzodiazepine use  6 acute lactic acidosis Most likely secondary to #1 and 2 IV fluids for rehydration, n.p.o. for now, general surgery to see, and continue close medical monitoring  7 chronic GERD with possible esophagitis PPI daily  DNR status per patient wishes Condition stable Long-term prognosis is dismal DVT prophylaxis with heparin subcu Disposition Home in 2-3 days pending clinical course    All the records are reviewed and case discussed with ED provider. Management plans discussed with the patient, family and they are in  agreement.  CODE STATUS: Code Status History    Date Active Date Inactive Code Status Order ID Comments User Context   08/07/2016 23:01 08/18/2016 16:50 DNR [id number]  Lily Kocher, MD Inpatient   05/05/2015 18:14 05/11/2015 16:04 Full Code [id number]  Theodis Blaze, MD Inpatient   01/13/2015 17:07 01/16/2015 16:47 Full Code [id number]   Molly Maduro, MD Inpatient    Questions for Most Recent Historical Code Status (Order [id number])    Question Answer Comment   In the event of cardiac or respiratory ARREST Do not call a "code blue"    In the event of cardiac or respiratory ARREST Do not perform Intubation, CPR, defibrillation or ACLS    In the event of cardiac or respiratory ARREST Use medication by any route, position, wound care, and other measures to relive pain and suffering. May use oxygen, suction and manual treatment of airway obstruction as needed for comfort.        TOTAL TIME TAKING CARE OF THIS PATIENT: 45 minutes.    Avel Peace Tolbert Matheson M.D on 09/12/2017   Between 7am to 6pm - Pager - [phone]  After 6pm go to [email or web address] - password [email or web address] [facility]  Office  [phone]  CC: Primary care physician; [name], MD   Note: This dictation was prepared with Dragon dictation along with smaller phrase technology. Any transcriptional errors that result from this process are unintentional.

## 2017-09-12 NOTE — Telephone Encounter (Signed)
Roxanne called to report that patient is having abdominal pain "like rocks in bottom of stomach" Not eating much at all, Not having much stool from colostomy. He complains of feeling dizzy, and she states he is disoriented and doing odd things which is noew.  Accepts appointment for 330 this afternoon

## 2017-09-13 DIAGNOSIS — R4182 Altered mental status, unspecified: Secondary | ICD-10-CM

## 2017-09-13 DIAGNOSIS — I10 Essential (primary) hypertension: Secondary | ICD-10-CM

## 2017-09-13 DIAGNOSIS — Z7722 Contact with and (suspected) exposure to environmental tobacco smoke (acute) (chronic): Secondary | ICD-10-CM

## 2017-09-13 DIAGNOSIS — Z85828 Personal history of other malignant neoplasm of skin: Secondary | ICD-10-CM

## 2017-09-13 DIAGNOSIS — E878 Other disorders of electrolyte and fluid balance, not elsewhere classified: Secondary | ICD-10-CM

## 2017-09-13 DIAGNOSIS — K435 Parastomal hernia without obstruction or  gangrene: Secondary | ICD-10-CM

## 2017-09-13 DIAGNOSIS — R109 Unspecified abdominal pain: Secondary | ICD-10-CM

## 2017-09-13 DIAGNOSIS — K769 Liver disease, unspecified: Secondary | ICD-10-CM

## 2017-09-13 DIAGNOSIS — Z9049 Acquired absence of other specified parts of digestive tract: Secondary | ICD-10-CM

## 2017-09-13 DIAGNOSIS — D709 Neutropenia, unspecified: Secondary | ICD-10-CM

## 2017-09-13 DIAGNOSIS — A419 Sepsis, unspecified organism: Principal | ICD-10-CM

## 2017-09-13 DIAGNOSIS — C7800 Secondary malignant neoplasm of unspecified lung: Secondary | ICD-10-CM

## 2017-09-13 DIAGNOSIS — C186 Malignant neoplasm of descending colon: Secondary | ICD-10-CM

## 2017-09-13 DIAGNOSIS — E669 Obesity, unspecified: Secondary | ICD-10-CM

## 2017-09-13 DIAGNOSIS — C787 Secondary malignant neoplasm of liver and intrahepatic bile duct: Secondary | ICD-10-CM

## 2017-09-13 DIAGNOSIS — D869 Sarcoidosis, unspecified: Secondary | ICD-10-CM

## 2017-09-13 DIAGNOSIS — Z87442 Personal history of urinary calculi: Secondary | ICD-10-CM

## 2017-09-13 DIAGNOSIS — Z66 Do not resuscitate: Secondary | ICD-10-CM

## 2017-09-13 DIAGNOSIS — C786 Secondary malignant neoplasm of retroperitoneum and peritoneum: Secondary | ICD-10-CM

## 2017-09-13 HISTORY — DX: Neutropenia, unspecified: D70.9

## 2017-09-13 LAB — COMPREHENSIVE METABOLIC PANEL
ALBUMIN: 2.8 g/dL — AB (ref 3.5–5.0)
ALK PHOS: 83 U/L (ref 38–126)
ALT: 18 U/L (ref 17–63)
ANION GAP: 9 (ref 5–15)
AST: 27 U/L (ref 15–41)
BILIRUBIN TOTAL: 2.1 mg/dL — AB (ref 0.3–1.2)
BUN: 10 mg/dL (ref 6–20)
CALCIUM: 7.5 mg/dL — AB (ref 8.9–10.3)
CO2: 21 mmol/L — AB (ref 22–32)
CREATININE: 1.02 mg/dL (ref 0.61–1.24)
Chloride: 106 mmol/L (ref 101–111)
GFR calc non Af Amer: >60 mL/min (ref >60)
GLUCOSE: 118 mg/dL — AB (ref 65–99)
Potassium: 3.5 mmol/L (ref 3.5–5.1)
SODIUM: 136 mmol/L (ref 135–145)
TOTAL PROTEIN: 6.2 g/dL — AB (ref 6.5–8.1)

## 2017-09-13 LAB — CBC
HCT: 24.3 % — ABNORMAL LOW (ref 40.0–52.0)
HEMOGLOBIN: 8.3 g/dL — AB (ref 13.0–18.0)
MCH: 32.3 pg (ref 26.0–34.0)
MCHC: 34.1 g/dL (ref 32.0–36.0)
MCV: 94.9 fL (ref 80.0–100.0)
PLATELETS: 110 10*3/uL — AB (ref 150–440)
RBC: 2.56 MIL/uL — ABNORMAL LOW (ref 4.40–5.90)
RDW: 20.4 % — AB (ref 11.5–14.5)
WBC: 0.8 10*3/uL — CL (ref 3.8–10.6)

## 2017-09-13 LAB — LACTIC ACID, PLASMA: LACTIC ACID, VENOUS: 1.6 mmol/L (ref 0.5–1.9)

## 2017-09-13 MED ORDER — VANCOMYCIN HCL IN DEXTROSE 1-5 GM/200ML-% IV SOLN
1000.0000 mg | Freq: Once | INTRAVENOUS | Status: AC
  Administered 2017-09-13: 12:00:00 1000 mg via INTRAVENOUS
  Filled 2017-09-13 (×2): qty 200

## 2017-09-13 MED ORDER — TBO-FILGRASTIM 480 MCG/0.8ML ~~LOC~~ SOSY
480.0000 ug | PREFILLED_SYRINGE | Freq: Once | SUBCUTANEOUS | Status: AC
  Administered 2017-09-13: 480 ug via SUBCUTANEOUS
  Filled 2017-09-13: qty 0.8

## 2017-09-13 MED ORDER — VANCOMYCIN HCL IN DEXTROSE 1-5 GM/200ML-% IV SOLN
1000.0000 mg | Freq: Three times a day (TID) | INTRAVENOUS | Status: DC
  Administered 2017-09-13 – 2017-09-16 (×8): 1000 mg via INTRAVENOUS
  Filled 2017-09-13 (×10): qty 200

## 2017-09-13 MED ORDER — OXYCODONE HCL 5 MG PO TABS
10.0000 mg | ORAL_TABLET | ORAL | Status: DC | PRN
  Administered 2017-09-13 – 2017-09-16 (×12): 10 mg via ORAL
  Filled 2017-09-13 (×13): qty 2

## 2017-09-13 MED ORDER — FENTANYL 50 MCG/HR TD PT72
75.0000 ug | MEDICATED_PATCH | TRANSDERMAL | Status: DC
  Administered 2017-09-14: 75 ug via TRANSDERMAL
  Filled 2017-09-13 (×3): qty 1

## 2017-09-13 NOTE — Consult Note (Signed)
[facility]  Date of admission:  09/12/2017  Inpatient day:  09/13/2017  Consulting physician:  Dr. Holly Bodily Salary.   Reason for Consultation:  Stage IV colon cancer with cancer pain.  Chief Complaint: [name] is a 57 y.o. male with metastatic colon cancer who was admitted through the emergency room with altered mental status, sepsis, acute on chronic abdominal pain, and multiple electrolyte abnormalities  HPI:  The patient was diagnosed with metastatic colon cancer in 04/2015.  He underwent surgery followed by FOLFOX chemotherapy then FOLFIRI in 08/2015 secondary to neuropathy.  He developed a small bowel obstruction in 08/2016 requiring resection. Pathology revealed recurrence. PET scan in 09/2016 revealed metastatic disease to liver, lung, omentum and peritoneum.  He then underwent chemotherapy with FOLFIRI + panitumumab in 10/2016.  Teatment was discontinued secondary to diarrhea. He then received 5-fluorouracil with Avastin.  Disease progressed.  He received single agent panitumumab in 05/2017. Treatment was discontinued secondary to intolerance.  On 07/18/2017, he began [place].  He was last seen in the medical oncology clinic on 08/15/2017 by [name].  At that time, he had chronic abdominal pain and chronic neuropathy.  Cycle #2 Lonsurf was held secondary to low ANC (900).  [place] was 2900 on 08/19/2017.  Cycle #2 Lonsurf was initiated (day 1-5 and 8-12 of a 28 day cycle).   He presented to the ER with fever, acute on chronic abdominal pain, nausea, decreased oral intake, dizziness, and confusion.  CBC revealed a hematocrit of 30.1, hemoglobin 10.2, platelets 170,000, WBC 1500 with an ANC of 900.  Bilirubin was 3.4.  Lactic acid was elevated (4.3 then 4.9).  CXR was negative.  Urinalysis negative.  Abdomen and pelvic CT on 09/12/2017 revealed progression of metastatic disease within the lungs and peritoneum.  There was mild improvement in hepatic and nodal  metastasis.  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  There was progression of small bowel pneumatosis with mesenteric air since 05/13/2017. This was suspicious for small bowel ischemia.  There was dstal esophageal wall thickening suggesting esophagitis and bilateral nephrolithiasis.  He was cultured and started on broad spectrum antibiotics (vancomycin and Zosyn).  He has been seen by Dr. Phoebe Perch of surgery.  Pneumatosis was felt to be stable from 04/2017.  If surgical intervention needed, he wishes to be transferred to [facility].  Symptomatically, he is feeling better.  He is less confused.  Abdominal pain is slightly better.   Past Medical History:  Diagnosis Date  . Abdominal pain    mid abdomen  . Adiposity 03/21/2015  . Arthritis   . 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 Colon cancer ([place])   . 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 Extreme obesity 12/21/2012  . GERD (gastroesophageal reflux disease)   . History of kidney stones   . History of transfusion   . Hypertension    has been off BP med x 6 months  . Iron deficiency anemia due to chronic blood loss 05/08/2015  . LBP (low back pain) 05/28/2014  . Neuropathy   . PONV (postoperative nausea and vomiting)   . Sarcoid 10/2012  . Skin cancer    "it flairs up with sarcoidosis"    Past Surgical History:  Procedure Laterality Date  . CARDIAC CATHETERIZATION    . FLEXIBLE SIGMOIDOSCOPY N/A 05/07/2015   Procedure: FLEXIBLE SIGMOIDOSCOPY;  Surgeon: Laurence Spates, MD;  Location: [facility];  Service: Endoscopy;  Laterality: N/A;  . LAPAROSCOPIC PARTIAL COLECTOMY N/A 05/07/2015  Procedure: LAPAROSCOPIC ASSITED PARTIAL COLECTOMY WITH COLOSTOMY, SMALL BOWEL RESECTION, EXCISION OF PERITONEAL NODULE;  Surgeon: [name], MD;  Location: [facility]S;  Service: General;  Laterality: N/A;  . LAPAROTOMY N/A 08/10/2016   Procedure: EXPLORATORY LAPAROTOMY small bowel resection abdominal wall  resection partial omentectomy;  Surgeon: Excell Seltzer, MD;  Location: [facility];  Service: General;  Laterality: N/A;  . PORTACATH PLACEMENT Right 05/27/2015   Procedure: INSERTION PORT-A-CATH;  Surgeon: [name], MD;  Location: [facility];  Service: General;  Laterality: Right;    Family History  Problem Relation Age of Onset  . Arthritis Mother   . Heart disease Mother   . Stroke Father   . Prostate cancer Neg Hx   . Bladder Cancer Neg Hx     Social History:  reports that he is a non-smoker but has been exposed to tobacco smoke. he has never used smokeless tobacco. He reports that he does not drink alcohol or use drugs.  He is accompanied by his brother-in-law.  Allergies:  Allergies  Allergen Reactions  . Versed [Midazolam] Nausea And Vomiting  . Gabapentin Other (See Comments)    Swallowing problems  . Paba Derivatives Nausea And Vomiting    Pt states he is allergic to unknown anesthesia. Pt has nausea and vomiting with anesthesia.   . Benzyl Benzoate Nausea And Vomiting    Pt states he is allergic to unknown anesthesia. Pt has nausea and vomiting with anesthesia.     Medications Prior to Admission  Medication Sig Dispense Refill  . fentaNYL (DURAGESIC - DOSED MCG/HR) 75 MCG/HR Place 1 patch (75 mcg total) onto the skin every 3 (three) days. 10 patch 0  . lidocaine-prilocaine (EMLA) cream Apply 1 application topically as needed. Apply small amount to port site at least 1 hour prior to it being accessed, cover with plastic wrap 30 g 1  . loperamide (IMODIUM A-D) 2 MG tablet Take 2 mg by mouth 4 (four) times daily as needed for diarrhea or loose stools.    Marland Kitchen LORazepam (ATIVAN) 0.5 MG tablet Take every 8 hours as needed for nausea/anxiety. 90 tablet 3  . omeprazole (PRILOSEC) 20 MG capsule Take 1 capsule (20 mg total) by mouth 2 (two) times daily before a meal. 60 capsule 4  . ondansetron (ZOFRAN) 8 MG tablet Take 1 tablet (8 mg total) by mouth every 8 (eight) hours as needed  for nausea or vomiting. 90 tablet 2  . Oxycodone HCl 10 MG TABS Take 1 tablet (10 mg total) by mouth every 6 (six) hours as needed (severe pain). 60 tablet 0  . predniSONE (DELTASONE) 1 MG tablet Crush 1 tablet daily and mix with stoma paste and apply to wound 30 tablet 1  . promethazine (PHENERGAN) 25 MG tablet Take 1 tablet (25 mg total) by mouth every 6 (six) hours as needed. 60 tablet 6  . sucralfate (CARAFATE) 1 g tablet Take 1 tablet (1 g total) by mouth 4 (four) times daily. 120 tablet 1  . trifluridine-tipiracil (LONSURF) 20-8.19 MG tablet Take 4 tablets (80 mg trifluridine) by mouth two times daily after a meal. 1 hr after AM & PM meals on days 1-5, 8-12. Repeat every 28day 80 tablet 4  . zolpidem (AMBIEN) 10 MG tablet Take 10-15 mg by mouth at bedtime as needed for sleep.      Review of Systems: GENERAL:  [name] little better".  Fever at home.  No sweats or weight loss. PERFORMANCE STATUS (ECOG):  2 HEENT:  No  visual changes, runny nose, sore throat, mouth sores or tenderness. Lungs: No shortness of breath or cough.  No hemoptysis. Cardiac:  No chest pain, palpitations, orthopnea, or PND. GI:  Abdominal pain (see HPI).  Nausea, improved.  No vomiting.  No diarrhea, constipation, melena or hematochezia. GU:  No urgency, frequency, dysuria, or hematuria. Musculoskeletal:  No back pain.  No joint pain.  No muscle tenderness. Extremities:  No pain or swelling. Skin:  No rashes or skin changes.  No skin breakdown. Neuro:  General weakness.  Neuropathy.  No headache, or weakness, balance or coordination issues. Endocrine:  No diabetes, thyroid issues, hot flashes or night sweats. Psych:  No mood changes, depression or anxiety. Pain:  Abdominal pain (see HPI). Review of systems:  All other systems reviewed and found to be negative.  Physical Exam:  Blood pressure 132/62, pulse 97, temperature 99.7 F (37.6 C), temperature source Oral, resp. rate 20, height 5' 11"  (1.803 m), weight 265  lb 4.8 oz (120.3 kg), SpO2 97 %.  GENERAL:  Well developed, well nourished, gentleman lying comfortably on the medical unit in no acute distress. MENTAL STATUS:  Alert and oriented to person, place and time. HEAD:  Graying hair.  Male pattern baldness.  Normocephalic, atraumatic, face symmetric, no Cushingoid features. EYES:  Blue eyes.  Slight icterus.  Pupils equal round and reactive to light and accomodation.  No conjunctivitis. ENT:  Oropharynx clear without lesion.  Tongue normal. Mucous membranes dry.  RESPIRATORY:  Clear to auscultation anteriorly without rales, wheezes or rhonchi. CARDIOVASCULAR:  Regular rate and rhythm without murmur, rub or gallop. ABDOMEN:  Fully round.  Left sided ostomy with thin light brown stool.  Soft, tender left side of abdomen without guarding or rebound tenderness.  Active bowel sounds, and no appreciable hepatosplenomegaly.  No masses. SKIN:  No rashes, ulcers or lesions. EXTREMITIES: No edema, no skin discoloration or tenderness.  No palpable cords. LYMPH NODES: No palpable cervical, supraclavicular, axillary or inguinal adenopathy  NEUROLOGICAL: Appropriate. PSYCH:  Appropriate.   Results for orders placed or performed during the hospital encounter of 09/12/17 (from the past 48 hour(s))  Comprehensive metabolic panel     Status: Abnormal   Collection Time: 09/12/17  2:37 PM  Result Value Ref Range   Sodium 133 (L) 135 - 145 mmol/L   Potassium 3.3 (L) 3.5 - 5.1 mmol/L   Chloride 100 (L) 101 - 111 mmol/L   CO2 20 (L) 22 - 32 mmol/L   Glucose, Bld 160 (H) 65 - 99 mg/dL   BUN 10 6 - 20 mg/dL   Creatinine, Ser 1.10 0.61 - 1.24 mg/dL   Calcium 8.1 (L) 8.9 - 10.3 mg/dL   Total Protein 7.3 6.5 - 8.1 g/dL   Albumin 3.6 3.5 - 5.0 g/dL   AST 36 15 - 41 U/L   ALT 24 17 - 63 U/L   Alkaline Phosphatase 117 38 - 126 U/L   Total Bilirubin 3.4 (H) 0.3 - 1.2 mg/dL   GFR calc non Af Amer >60 >60 mL/min   GFR calc Af Amer >60 >60 mL/min    Comment: (NOTE) The  eGFR has been calculated using the CKD EPI equation. This calculation has not been validated in all clinical situations. eGFR's persistently <60 mL/min signify possible Chronic Kidney Disease.    Anion gap 13 5 - 15    Comment: Performed at [facility], [address] [place], [place] [address]  CBC with Differential     Status:  Abnormal   Collection Time: 09/12/17  2:37 PM  Result Value Ref Range   WBC 1.5 (L) 3.8 - 10.6 K/uL   RBC 3.22 (L) 4.40 - 5.90 MIL/uL   Hemoglobin 10.2 (L) 13.0 - 18.0 g/dL   HCT 30.1 (L) 40.0 - 52.0 %   MCV 93.5 80.0 - 100.0 fL   MCH 31.8 26.0 - 34.0 pg   MCHC 34.0 32.0 - 36.0 g/dL   RDW 19.6 (H) 11.5 - 14.5 %   Platelets 170 150 - 440 K/uL   Neutrophils Relative % 57 %   Neutro Abs 0.9 (L) 1.4 - 6.5 K/uL   Lymphocytes Relative 20 %   Lymphs Abs 0.3 (L) 1.0 - 3.6 K/uL   Monocytes Relative 23 %   Monocytes Absolute 0.4 0.2 - 1.0 K/uL   Eosinophils Relative 0 %   Eosinophils Absolute 0.0 0 - 0.7 K/uL   Basophils Relative 0 %   Basophils Absolute 0.0 0 - 0.1 K/uL    Comment: Performed at [facility], [address], [place], [place] [address]  Protime-INR     Status: Abnormal   Collection Time: 09/12/17  2:37 PM  Result Value Ref Range   Prothrombin Time 16.5 (H) 11.4 - 15.2 seconds   INR 1.34     Comment: Performed at [facility], [address], [place], [place] [address]  Culture, blood (Routine x 2)     Status: None (Preliminary result)   Collection Time: 09/12/17  2:37 PM  Result Value Ref Range   Specimen Description BLOOD Blood Culture adequate volume    Special Requests BLOOD LEFT ARM    Culture      NO GROWTH < 24 HOURS Performed at [facility], [address] [place], [place] [address]    Report Status PENDING   Culture, blood (Routine x 2)     Status: None (Preliminary result)   Collection Time: 09/12/17  2:37 PM  Result Value Ref Range   Specimen Description BLOOD Blood Culture adequate  volume    Special Requests BLOOD RIGHT HAND    Culture      NO GROWTH < 24 HOURS Performed at [facility], [address], [place], [place] [address]    Report Status PENDING   Lipase, blood     Status: None   Collection Time: 09/12/17  2:37 PM  Result Value Ref Range   Lipase 30 11 - 51 U/L    Comment: Performed at [facility], [address] [place], [place] [address]  Troponin I     Status: None   Collection Time: 09/12/17  2:37 PM  Result Value Ref Range   Troponin I <0.03 <0.03 ng/mL    Comment: Performed at [facility], [address] [place], [place] [address]  Magnesium     Status: Abnormal   Collection Time: 09/12/17  2:37 PM  Result Value Ref Range   Magnesium 1.4 (L) 1.7 - 2.4 mg/dL    Comment: Performed at [facility], [address] [place], [place] [address]  Lactic acid, plasma     Status: Abnormal   Collection Time: 09/12/17  4:25 PM  Result Value Ref Range   Lactic Acid, Venous 4.3 (HH) 0.5 - 1.9 mmol/L    Comment: CRITICAL RESULT CALLED TO, READ BACK BY AND VERIFIED WITH STEVEN JONES ON 09/12/17 AT 1530 [facility] Performed at [facility], [address], [place],  [address]   Urinalysis, Routine  w reflex microscopic     Status: Abnormal   Collection Time: 09/12/17  4:45 PM  Result Value Ref Range   Color, Urine AMBER (A) YELLOW    Comment: BIOCHEMICALS MAY BE AFFECTED BY COLOR   APPearance CLEAR (A) CLEAR   Specific Gravity, Urine 1.016 1.005 - 1.030   pH 6.0 5.0 - 8.0   Glucose, UA NEGATIVE NEGATIVE mg/dL   Hgb urine dipstick NEGATIVE NEGATIVE   Bilirubin Urine NEGATIVE NEGATIVE   Ketones, ur NEGATIVE NEGATIVE mg/dL   Protein, ur 30 (A) NEGATIVE mg/dL   Nitrite NEGATIVE NEGATIVE   Leukocytes, UA NEGATIVE NEGATIVE   RBC / HPF 0-5 0 - 5 RBC/hpf   WBC, UA 0-5 0 - 5 WBC/hpf   Bacteria, UA NONE SEEN NONE SEEN   Squamous Epithelial / LPF 0-5 (A) NONE SEEN   Mucus PRESENT    Hyaline Casts, UA  PRESENT     Comment: Performed at [facility], [address] [place], [place] [address]  Lactic acid, plasma     Status: Abnormal   Collection Time: 09/12/17  5:00 PM  Result Value Ref Range   Lactic Acid, Venous 4.9 (HH) 0.5 - 1.9 mmol/L    Comment: CRITICAL RESULT CALLED TO, READ BACK BY AND VERIFIED WITH STEVEN JONESO N 09/12/17 AT 6045 [facility] Performed at [facility], [address] [place], [place] [address]   Ammonia     Status: None   Collection Time: 09/12/17  9:27 PM  Result Value Ref Range   Ammonia 20 9 - 35 umol/L    Comment: Performed at [facility], [address] [place], [place] [address]  Lactic acid, plasma     Status: Abnormal   Collection Time: 09/12/17  9:27 PM  Result Value Ref Range   Lactic Acid, Venous 3.4 (HH) 0.5 - 1.9 mmol/L    Comment: CRITICAL RESULT CALLED TO, READ BACK BY AND VERIFIED WITH BRANDY MILLER ON 09/12/17 AT 2213 [facility] Performed at [facility], [address] [place], [place] [address]   Lactic acid, plasma     Status: None   Collection Time: 09/13/17 12:32 AM  Result Value Ref Range   Lactic Acid, Venous 1.6 0.5 - 1.9 mmol/L    Comment: Performed at [facility], [address] [place], [place] [address]  Comprehensive metabolic panel     Status: Abnormal   Collection Time: 09/13/17  5:36 AM  Result Value Ref Range   Sodium 136 135 - 145 mmol/L   Potassium 3.5 3.5 - 5.1 mmol/L   Chloride 106 101 - 111 mmol/L   CO2 21 (L) 22 - 32 mmol/L   Glucose, Bld 118 (H) 65 - 99 mg/dL   BUN 10 6 - 20 mg/dL   Creatinine, Ser 1.02 0.61 - 1.24 mg/dL   Calcium 7.5 (L) 8.9 - 10.3 mg/dL   Total Protein 6.2 (L) 6.5 - 8.1 g/dL   Albumin 2.8 (L) 3.5 - 5.0 g/dL   AST 27 15 - 41 U/L   ALT 18 17 - 63 U/L   Alkaline Phosphatase 83 38 - 126 U/L   Total Bilirubin 2.1 (H) 0.3 - 1.2 mg/dL   GFR calc non Af Amer >60 >60 mL/min   GFR calc Af Amer >60 >60 mL/min    Comment: (NOTE) The eGFR has been  calculated using the CKD EPI equation. This calculation has not been validated in all clinical situations. eGFR's persistently <60 mL/min signify possible Chronic  Kidney Disease.    Anion gap 9 5 - 15    Comment: Performed at [facility], [address] [place], [place] [address]  CBC     Status: Abnormal   Collection Time: 09/13/17  5:36 AM  Result Value Ref Range   WBC 0.8 (LL) 3.8 - 10.6 K/uL    Comment: RESULT REPEATED AND VERIFIED CRITICAL RESULT CALLED TO, READ BACK BY AND VERIFIED WITH: BRANDY MILLER 09/13/17 0657 [facility]    RBC 2.56 (L) 4.40 - 5.90 MIL/uL   Hemoglobin 8.3 (L) 13.0 - 18.0 g/dL   HCT 24.3 (L) 40.0 - 52.0 %   MCV 94.9 80.0 - 100.0 fL   MCH 32.3 26.0 - 34.0 pg   MCHC 34.1 32.0 - 36.0 g/dL   RDW 20.4 (H) 11.5 - 14.5 %   Platelets 110 (L) 150 - 440 K/uL    Comment: Performed at [facility], [address] [place], [place] [address]   Ct Abdomen Pelvis W Contrast  Result Date: 09/12/2017 CLINICAL DATA:  Left lower quadrant pain. Tachycardia. Stage 4 colon cancer. EXAM: CT ABDOMEN AND PELVIS WITH CONTRAST TECHNIQUE: Multidetector CT imaging of the abdomen and pelvis was performed using the standard protocol following bolus administration of intravenous contrast. CONTRAST:  <See Chart> ISOVUE-300 IOPAMIDOL (ISOVUE-300) INJECTION 61% COMPARISON:  05/13/2017 FINDINGS: Lower chest: Bibasilar lung nodules/metastasis. An index 1.3 cm right lower lobe nodule on image 2/series 4 is enlarged from 1.0 cm on the prior (when remeasured). Mild cardiomegaly, without pericardial or pleural effusion. Distal esophageal wall thickening is again identified. Hepatobiliary: Moderate hepatic steatosis. Hepatic metastasis. Index high right hepatic lobe lesion measures 3.2 cm today versus 3.6 cm on the prior. High right hepatic lobe 3.1 cm lesion image 20/series 2 measured 3.5 cm on the prior exam. Normal gallbladder, without biliary ductal dilatation. Pancreas: Fatty  replacement throughout the pancreas. Spleen: Normal in size, without focal abnormality. Adrenals/Urinary Tract: Normal adrenal glands. Bilateral renal collecting system calculi. An interpolar left renal too small to characterize lesion is likely a cyst. No hydronephrosis. Normal urinary bladder. Stomach/Bowel: Normal stomach, without wall thickening. Hartmann's pouch. Descending colostomy. 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 Mid abdominal small bowel loops, including in the region of the parastomal hernia, demonstrate increased pneumatosis, including on image 47/series 2. There is extraluminal mesenteric gas again identified, small volume including on image 62/series 2. Vascular/Lymphatic: Aortic atherosclerosis. Small bowel mesenteric adenopathy, including at 1.8 x 1.5 cm today versus 2.7 x 2.1 cm on the prior. No pelvic sidewall adenopathy. Reproductive: Normal prostate. Other: No significant free fluid. Extensive omental/ peritoneal metastasis. Index left pelvic lesion measures 3.2 x 2.2 cm on image 75/series 2. Compare 2.8 x 2.0 cm on the prior. Anterior abdominal wall metastasis measures 4.7 x 3.0 cm on image 56/series 2 versus 4.4 x 1.8 cm on the prior. Musculoskeletal: No acute osseous abnormality. IMPRESSION: 1. Progression of metastatic disease within the lungs and peritoneum. 2. Mild improvement in hepatic and nodal metastasis. 3. 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 Progression of small bowel pneumatosis with mesenteric air since 05/13/2017. This is again suspicious for small bowel ischemia. 4. Distal esophageal wall thickening suggests esophagitis. 5. Bilateral nephrolithiasis. Electronically Signed   By: Abigail Miyamoto M.D.   On: 09/12/2017 17:07   Dg Chest Port 1 View  Result Date: 09/12/2017 CLINICAL DATA:  Diaphoresis. Altered mental status. Left lower quadrant abdominal pain. EXAM: PORTABLE CHEST 1 VIEW COMPARISON:  Chest, abdomen pelvis  CT  dated 05/13/2017. FINDINGS: Poor inspiration. Grossly normal sized heart. Stable mild linear scarring at the left lung base. The previously demonstrated pulmonary nodules are not well visualized today. Right jugular porta catheter tip in the inferior aspect of the superior vena cava. Unremarkable bones. IMPRESSION: No acute abnormality. Electronically Signed   By: Claudie Revering M.D.   On: 09/12/2017 15:16    Assessment:  The patient is a 57 y.o. gentleman with metastatic colon cancer currently day 26 s/p cycle #2 Lonsurf who presented with sepsis.  He has been treated with surgical resection (04/2015), FOLFOX, FOLFIRI (08/2015), FOLFIRI + panitumumab (10/2016), 5-fluorouracil with Avastin, panitumumab (05/2017) and [place]ons[place]f (began 07/18/2017).  Abdomen and pelvic CT on 09/12/2017 revealed progression of metastatic disease within the lungs and peritoneum.  There was mild improvement in hepatic and nodal metastasis.  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  There was progression of small bowel pneumatosis with mesenteric air since 05/13/2017. This was suspicious for small bowel ischemia.  There was dstal esophageal wall thickening suggesting esophagitis and bilateral nephrolithiasis.  Symptomatically,  Plan:   1.  Oncology:  Metastatic colon cancer s/p multiple lines of chemotherapy.  Recent scans reveals progressive disease as compared to 05/13/2017.  Patient is s/p 2 cycles of Lonsurf.  Patient states that he has gone to [facility] for potential clinical trial enrollment (no available options).  Await Dr. Aletha Halim return regarding future plans for further Lonsurf or other clinical trials.  Patient aware that treatment options are limited.  2.  Hematology:  Neutropenic precautions.  WBC 1500 to 800 in past 24 hours.  Etiology likely secondary to sepsis and recent chemotherapy.  Discussed plan for GCSF/Neupogen 480 mcg SQ q 24 hours until [place] recovers.  3.   Infectious disease:  Patient on broad spectrum antibiotics (vancomycin and Zosyn).  CXR and urinalysis negative.  Cultures negative to date.  Abdominal source suspected.  Blood cultures q 24 hours prn temp >= 100.4.  4.  Gastroenterology:   Appreciate surgery consultation.  Patient currently NPO.  5.  Disposition:  Code status DNR.     Thank you for allowing me to participate in [name] 's care.  I will follow him  closely with you until Dr. Aletha Halim return tomorrow.   Lequita Asal, MD  09/13/2017, 11:27 AM

## 2017-09-13 NOTE — Progress Notes (Signed)
CRITICAL VALUE ALERT  Critical Value:  WBC 0.8 HBG 8.3  Date & Time Notied:  09/13/2017 Paged [phone] and [phone]  Provider Notified: Hospitalist  Orders Received/Actions taken: awaiting response no orders given at this time

## 2017-09-13 NOTE — Progress Notes (Signed)
ANTIBIOTIC CONSULT NOTE - INITIAL   Pharmacy Consult for vancomycin Indication: FN  Allergies  Allergen Reactions  . Versed [Midazolam] Nausea And Vomiting  . Gabapentin Other (See Comments)    Swallowing problems  . Paba Derivatives Nausea And Vomiting    Pt states he is allergic to unknown anesthesia. Pt has nausea and vomiting with anesthesia.   . Benzyl Benzoate Nausea And Vomiting    Pt states he is allergic to unknown anesthesia. Pt has nausea and vomiting with anesthesia.     Patient Measurements: Height: 5\' 11"  (180.3 cm) Weight: 265 lb 4.8 oz (120.3 kg) IBW/kg (Calculated) : 75.3 Adjusted Body Weight:   Vital Signs: Temp: 99.7 F (37.6 C) (01/01 0551) Temp Source: Oral (01/01 0551) BP: 132/62 (01/01 0551) Pulse Rate: 97 (01/01 0551) Intake/Output from previous day: No intake/output data recorded. Intake/Output from this shift: No intake/output data recorded.  Labs: Recent Labs    09/12/17 1437 09/13/17 0536  WBC 1.5* 0.8*  HGB 10.2* 8.3*  PLT 170 110*  CREATININE 1.10 1.02   Estimated Creatinine Clearance: 105.4 mL/min (by C-G formula based on SCr of 1.02 mg/dL). No results for input(s): VANCOTROUGH, VANCOPEAK, VANCORANDOM, GENTTROUGH, GENTPEAK, GENTRANDOM, TOBRATROUGH, TOBRAPEAK, TOBRARND, AMIKACINPEAK, AMIKACINTROU, AMIKACIN in the last 72 hours.   Microbiology: No results found for this or any previous visit (from the past 720 hour(s)).  Medical History: Past Medical History:  Diagnosis Date  . Abdominal pain    mid abdomen  . Adiposity 03/21/2015  . Arthritis   . 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 Colon cancer ([place])   . 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 Extreme obesity 12/21/2012  . GERD (gastroesophageal reflux disease)   . History of kidney stones   . History of transfusion   . Hypertension    has been off BP med x 6 months  . Iron deficiency anemia due to chronic blood loss 05/08/2015  . LBP (low back pain) 05/28/2014  .  Neuropathy   . PONV (postoperative nausea and vomiting)   . Sarcoid 10/2012  . Skin cancer    "it flairs up with sarcoidosis"    Medications:  Infusions:  . 0.9 % NaCl with KCl 20 mEq / L 125 mL/hr at 09/13/17 0848  . piperacillin-tazobactam (ZOSYN)  IV Stopped (09/13/17 1011)  . vancomycin    . vancomycin     Assessment: 56 yom cc AMS with acute abdominal pain and chronic recurrent pneumatosis. Not a surgical candidate due to extensive metastases (metastatic colon cancer). Pharmacy consulted to dose vancomycin for febrile neutropenia.  Goal of Therapy:  Vancomycin trough level 15-20 mcg/ml  Plan:  Vancomycin 1 gm IV x 1 followed in approximately 6 hours by vancomycin 1 gm IV Q8H, predicted trough 15 mcg/mL. Pharmacy will continue to follow and adjust as needed to maintain trough 15 to 20 mcg/mL.   Vd 65.3 L, Ke 0.092 hr-1, T1/2 7.5 hr  Laural Benes, Pharm.D., BCPS Clinical Pharmacist 09/13/2017,10:48 AM

## 2017-09-13 NOTE — Progress Notes (Signed)
Patient febrile tmax 101.4. Tylenol 650 mg given PO. Order activated for blood cx at this time.

## 2017-09-13 NOTE — Progress Notes (Signed)
[facility] at [place] NAME: [name]    MR#:  [id number]  DATE OF BIRTH:  Apr 17, 1961  SUBJECTIVE: Patient admitted for abdominal pain, sepsis, noted to have fever, altered mental status Started on IV fluids, IV antibiotics and today his lactic acid level down, he feels better.  More alert than yesterday as per wife.  CHIEF COMPLAINT:   Chief Complaint  Patient presents with  . Altered Mental Status    REVIEW OF SYSTEMS:   ROS CONSTITUTIONAL: Nstill has low-grade temperature and feels weak. EYES: No blurred or double vision.  EARS, NOSE, AND THROAT: No tinnitus or ear pain.  RESPIRATORY: No cough, shortness of breath, wheezing or hemoptysis.  CARDIOVASCULAR: No chest pain, orthopnea, edema.  GASTROINTESTINAL: No nausea, vomiting, diarrhea decreased abdominal pain.  GENITOURINARY: No dysuria, hematuria.  ENDOCRINE: No polyuria, nocturia,  HEMATOLOGY: No anemia, easy bruising or bleeding SKIN: No rash or lesion. MUSCULOSKELETAL: No joint pain or arthritis.   NEUROLOGIC: No tingling, numbness, weakness.  PSYCHIATRY: No anxiety or depression.   DRUG ALLERGIES:   Allergies  Allergen Reactions  . Versed [Midazolam] Nausea And Vomiting  . Gabapentin Other (See Comments)    Swallowing problems  . Paba Derivatives Nausea And Vomiting    Pt states he is allergic to unknown anesthesia. Pt has nausea and vomiting with anesthesia.   . Benzyl Benzoate Nausea And Vomiting    Pt states he is allergic to unknown anesthesia. Pt has nausea and vomiting with anesthesia.     VITALS:  Blood pressure 132/62, pulse 97, temperature 99.7 F (37.6 C), temperature source Oral, resp. rate 20, height 5\' 11"  (1.803 m), weight 120.3 kg (265 lb 4.8 oz), SpO2 97 %.  PHYSICAL EXAMINATION:  GENERAL:  57 y.o.-year-old patient lying in the bed with no acute distress.  He is cachectic. EYES: Pupils equal, round, reactive to light  No scleral icterus.  Extraocular muscles intact.  HEENT: Head atraumatic, normocephalic. Oropharynx and nasopharynx clear.  NECK:  Supple, no jugular venous distention. No thyroid enlargement, no tenderness.  LUNGS: Normal breath sounds bilaterally, no wheezing, rales,rhonchi or crepitation. No use of accessory muscles of respiration.  CARDIOVASCULAR: S1, S2 normal. No murmurs, rubs, or gallops.  ABDOMEN: Mild guarding present, functional colostomy present.   continue colostomy bag present eXTREMITIES: No pedal edema, cyanosis, or clubbing.  NEUROLOGIC: Cranial nerves II through XII are intact. Muscle strength 5/5 in all extremities. Sensation intact. Gait not checked.  PSYCHIATRIC: The patient is alert and oriented x 3.  SKIN: No obvious rash, lesion, or ulcer.    LABORATORY PANEL:   CBC Recent Labs  Lab 09/13/17 0536  WBC 0.8*  HGB 8.3*  HCT 24.3*  PLT 110*   ------------------------------------------------------------------------------------------------------------------  Chemistries  Recent Labs  Lab 09/12/17 1437 09/13/17 0536  NA 133* 136  K 3.3* 3.5  CL 100* 106  CO2 20* 21*  GLUCOSE 160* 118*  BUN 10 10  CREATININE 1.10 1.02  CALCIUM 8.1* 7.5*  MG 1.4*  --   AST 36 27  ALT 24 18  ALKPHOS 117 83  BILITOT 3.4* 2.1*   ------------------------------------------------------------------------------------------------------------------  Cardiac Enzymes Recent Labs  Lab 09/12/17 1437  TROPONINI <0.03   ------------------------------------------------------------------------------------------------------------------  RADIOLOGY:  Ct Abdomen Pelvis W Contrast  Result Date: 09/12/2017 CLINICAL DATA:  Left lower quadrant pain. Tachycardia. Stage 4 colon cancer. EXAM: CT ABDOMEN AND PELVIS WITH CONTRAST TECHNIQUE: Multidetector CT imaging of the abdomen and pelvis was performed using the  standard protocol following bolus administration of intravenous contrast. CONTRAST:  <See Chart>  ISOVUE-300 IOPAMIDOL (ISOVUE-300) INJECTION 61% COMPARISON:  05/13/2017 FINDINGS: Lower chest: Bibasilar lung nodules/metastasis. An index 1.3 cm right lower lobe nodule on image 2/series 4 is enlarged from 1.0 cm on the prior (when remeasured). Mild cardiomegaly, without pericardial or pleural effusion. Distal esophageal wall thickening is again identified. Hepatobiliary: Moderate hepatic steatosis. Hepatic metastasis. Index high right hepatic lobe lesion measures 3.2 cm today versus 3.6 cm on the prior. High right hepatic lobe 3.1 cm lesion image 20/series 2 measured 3.5 cm on the prior exam. Normal gallbladder, without biliary ductal dilatation. Pancreas: Fatty replacement throughout the pancreas. Spleen: Normal in size, without focal abnormality. Adrenals/Urinary Tract: Normal adrenal glands. Bilateral renal collecting system calculi. An interpolar left renal too small to characterize lesion is likely a cyst. No hydronephrosis. Normal urinary bladder. Stomach/Bowel: Normal stomach, without wall thickening. Hartmann's pouch. Descending colostomy. 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 Mid abdominal small bowel loops, including in the region of the parastomal hernia, demonstrate increased pneumatosis, including on image 47/series 2. There is extraluminal mesenteric gas again identified, small volume including on image 62/series 2. Vascular/Lymphatic: Aortic atherosclerosis. Small bowel mesenteric adenopathy, including at 1.8 x 1.5 cm today versus 2.7 x 2.1 cm on the prior. No pelvic sidewall adenopathy. Reproductive: Normal prostate. Other: No significant free fluid. Extensive omental/ peritoneal metastasis. Index left pelvic lesion measures 3.2 x 2.2 cm on image 75/series 2. Compare 2.8 x 2.0 cm on the prior. Anterior abdominal wall metastasis measures 4.7 x 3.0 cm on image 56/series 2 versus 4.4 x 1.8 cm on the prior. Musculoskeletal: No acute osseous abnormality. IMPRESSION: 1. Progression of  metastatic disease within the lungs and peritoneum. 2. Mild improvement in hepatic and nodal metastasis. 3. 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 Progression of small bowel pneumatosis with mesenteric air since 05/13/2017. This is again suspicious for small bowel ischemia. 4. Distal esophageal wall thickening suggests esophagitis. 5. Bilateral nephrolithiasis. Electronically Signed   By: Abigail Miyamoto M.D.   On: 09/12/2017 17:07   Dg Chest Port 1 View  Result Date: 09/12/2017 CLINICAL DATA:  Diaphoresis. Altered mental status. Left lower quadrant abdominal pain. EXAM: PORTABLE CHEST 1 VIEW COMPARISON:  Chest, abdomen pelvis CT dated 05/13/2017. FINDINGS: Poor inspiration. Grossly normal sized heart. Stable mild linear scarring at the left lung base. The previously demonstrated pulmonary nodules are not well visualized today. Right jugular porta catheter tip in the inferior aspect of the superior vena cava. Unremarkable bones. IMPRESSION: No acute abnormality. Electronically Signed   By: Claudie Revering M.D.   On: 09/12/2017 15:16    EKG:   Orders placed or performed during the hospital encounter of 09/12/17  . EKG 12-Lead  . EKG 12-Lead  . ED EKG 12-Lead  . ED EKG 12-Lead  . ED EKG  . ED EKG  . EKG 12-Lead  . EKG 12-Lead  . EKG 12-Lead  . EKG 12-Lead    ASSESSMENT AND PLAN:   #1 abdominal pain acute on chronic with recurrent pneumatosis: Seen by surgery because of abdominal pain, pneumatosis.  Patient is not a surgical candidate because of extensive metastases.  And he feels better than yesterday.  Appreciate surgical consult. 2.  Sepsis with altered mental status: Lactic acid level coming down, continue IV fluids, IV antibiotics, follow blood cultures, discussed with patient and patient's wife.  And requesting ice chips. 3.  Metastatic colon cancer on palliative chemotherapy  followed by [name], I spoke with [name] over the phone and he  recommended that hold further chemotherapy and he will see the patient tomorrow and same was conveyed to the patient's wife.    Continue IV fluids, IV antibiotics, likely discharge tomorrow, continue neutropenic precautions.  4.Chronic pain syndrome due to cancer: Patient is on fentanyl patch.  Oxycodone. . 5..Acute on chronic anemia likely dilutional anemia: No indication for blood transfusion.   All the records are reviewed and case discussed with Care Management/Social Workerr. Management plans discussed with the patient, family and they are in agreement.  CODE STATUS: DNR TOTAL TIME TAKING CARE OF THIS PATIENT: 89minutes.   POSSIBLE D/C IN 1-2 DAYS, DEPENDING ON CLINICAL CONDITION.   Epifanio Lesches M.D on 09/13/2017 at 10:22 AM  Between 7am to 6pm - Pager - [phone]  After 6pm go to [email or web address] - password [email or web address]  Office  [phone]  CC: Primary care physician; [name], MD   Note: This dictation was prepared with Dragon dictation along with smaller phrase technology. Any transcriptional errors that result from this process are unintentional.

## 2017-09-14 DIAGNOSIS — T451X5S Adverse effect of antineoplastic and immunosuppressive drugs, sequela: Secondary | ICD-10-CM

## 2017-09-14 DIAGNOSIS — Z7189 Other specified counseling: Secondary | ICD-10-CM

## 2017-09-14 DIAGNOSIS — D709 Neutropenia, unspecified: Secondary | ICD-10-CM

## 2017-09-14 DIAGNOSIS — Z515 Encounter for palliative care: Secondary | ICD-10-CM

## 2017-09-14 DIAGNOSIS — K6389 Other specified diseases of intestine: Secondary | ICD-10-CM

## 2017-09-14 LAB — CBC
HCT: 22.7 % — ABNORMAL LOW (ref 40.0–52.0)
Hemoglobin: 7.5 g/dL — ABNORMAL LOW (ref 13.0–18.0)
MCH: 31.5 pg (ref 26.0–34.0)
MCHC: 33.2 g/dL (ref 32.0–36.0)
MCV: 94.8 fL (ref 80.0–100.0)
PLATELETS: 104 10*3/uL — AB (ref 150–440)
RBC: 2.39 MIL/uL — ABNORMAL LOW (ref 4.40–5.90)
RDW: 20.7 % — AB (ref 11.5–14.5)
WBC: 1 10*3/uL — CL (ref 3.8–10.6)

## 2017-09-14 LAB — URINE CULTURE: CULTURE: NO GROWTH

## 2017-09-14 LAB — CREATININE, SERUM
CREATININE: 0.95 mg/dL (ref 0.61–1.24)
GFR calc Af Amer: >60 mL/min (ref >60)
GFR calc non Af Amer: >60 mL/min (ref >60)

## 2017-09-14 MED ORDER — OXYCODONE HCL 5 MG PO TABS
5.0000 mg | ORAL_TABLET | Freq: Four times a day (QID) | ORAL | Status: DC | PRN
  Administered 2017-09-16: 5 mg via ORAL
  Filled 2017-09-14: qty 1

## 2017-09-14 MED ORDER — TBO-FILGRASTIM 480 MCG/0.8ML ~~LOC~~ SOSY
480.0000 ug | PREFILLED_SYRINGE | Freq: Every day | SUBCUTANEOUS | Status: DC
  Administered 2017-09-15 – 2017-09-16 (×2): 480 ug via SUBCUTANEOUS
  Filled 2017-09-14 (×3): qty 0.8

## 2017-09-14 NOTE — Progress Notes (Signed)
[facility] at [place] NAME: [name]    MR#:  [id number]  DATE OF BIRTH:  September 30, 1960  SUBJECTIVE: Patient admitted for abdominal pain, sepsis, noted to have fever, altered mental status Started on IV fluids, IV antibiotics and today his lactic acid level down, he feels better.  More alert than yesterday as per wife.  CHIEF COMPLAINT:   Chief Complaint  Patient presents with  . Altered Mental Status   No abd pain has fever and generalized weakness. REVIEW OF SYSTEMS:   ROS CONSTITUTIONAL: Nstill has low-grade temperature and feels weak. EYES: No blurred or double vision.  EARS, NOSE, AND THROAT: No tinnitus or ear pain.  RESPIRATORY: No cough, shortness of breath, wheezing or hemoptysis.  CARDIOVASCULAR: No chest pain, orthopnea, edema.  GASTROINTESTINAL: No nausea, vomiting, diarrhea decreased abdominal pain.  GENITOURINARY: No dysuria, hematuria.  ENDOCRINE: No polyuria, nocturia,  HEMATOLOGY: No anemia, easy bruising or bleeding SKIN: No rash or lesion. MUSCULOSKELETAL: No joint pain or arthritis.   NEUROLOGIC: No tingling, numbness, weakness.  PSYCHIATRY: No anxiety or depression.   DRUG ALLERGIES:   Allergies  Allergen Reactions  . Versed [Midazolam] Nausea And Vomiting  . Gabapentin Other (See Comments)    Swallowing problems  . Paba Derivatives Nausea And Vomiting    Pt states he is allergic to unknown anesthesia. Pt has nausea and vomiting with anesthesia.   . Benzyl Benzoate Nausea And Vomiting    Pt states he is allergic to unknown anesthesia. Pt has nausea and vomiting with anesthesia.     VITALS:  Blood pressure 131/78, pulse 96, temperature 99.5 F (37.5 C), temperature source Oral, resp. rate 18, height 5\' 11"  (1.803 m), weight 265 lb 4.8 oz (120.3 kg), SpO2 98 %.  PHYSICAL EXAMINATION:  GENERAL:  57 y.o.-year-old patient lying in the bed with no acute distress.  He is cachectic. EYES: Pupils equal,  round, reactive to light  No scleral icterus. Extraocular muscles intact.  HEENT: Head atraumatic, normocephalic. Oropharynx and nasopharynx clear.  NECK:  Supple, no jugular venous distention. No thyroid enlargement, no tenderness.  LUNGS: Normal breath sounds bilaterally, no wheezing, rales,rhonchi or crepitation. No use of accessory muscles of respiration.  CARDIOVASCULAR: S1, S2 normal. No murmurs, rubs, or gallops.  ABDOMEN: Mild guarding present, functional colostomy present. colostomy bag in situ. EXTREMITIES: No pedal edema, cyanosis, or clubbing.  NEUROLOGIC: Cranial nerves II through XII are intact. Muscle strength 4/5 in all extremities. Sensation intact. Gait not checked.  PSYCHIATRIC: The patient is alert and oriented x 3.  SKIN: No obvious rash, lesion, or ulcer.    LABORATORY PANEL:   CBC Recent Labs  Lab 09/14/17 0805  WBC 1.0*  HGB 7.5*  HCT 22.7*  PLT 104*   ------------------------------------------------------------------------------------------------------------------  Chemistries  Recent Labs  Lab 09/12/17 1437 09/13/17 0536  NA 133* 136  K 3.3* 3.5  CL 100* 106  CO2 20* 21*  GLUCOSE 160* 118*  BUN 10 10  CREATININE 1.10 1.02  CALCIUM 8.1* 7.5*  MG 1.4*  --   AST 36 27  ALT 24 18  ALKPHOS 117 83  BILITOT 3.4* 2.1*   ------------------------------------------------------------------------------------------------------------------  Cardiac Enzymes Recent Labs  Lab 09/12/17 1437  TROPONINI <0.03   ------------------------------------------------------------------------------------------------------------------  RADIOLOGY:  Ct Abdomen Pelvis W Contrast  Result Date: 09/12/2017 CLINICAL DATA:  Left lower quadrant pain. Tachycardia. Stage 4 colon cancer. EXAM: CT ABDOMEN AND PELVIS WITH CONTRAST TECHNIQUE: Multidetector CT imaging of the abdomen and  pelvis was performed using the standard protocol following bolus administration of intravenous  contrast. CONTRAST:  <See Chart> ISOVUE-300 IOPAMIDOL (ISOVUE-300) INJECTION 61% COMPARISON:  05/13/2017 FINDINGS: Lower chest: Bibasilar lung nodules/metastasis. An index 1.3 cm right lower lobe nodule on image 2/series 4 is enlarged from 1.0 cm on the prior (when remeasured). Mild cardiomegaly, without pericardial or pleural effusion. Distal esophageal wall thickening is again identified. Hepatobiliary: Moderate hepatic steatosis. Hepatic metastasis. Index high right hepatic lobe lesion measures 3.2 cm today versus 3.6 cm on the prior. High right hepatic lobe 3.1 cm lesion image 20/series 2 measured 3.5 cm on the prior exam. Normal gallbladder, without biliary ductal dilatation. Pancreas: Fatty replacement throughout the pancreas. Spleen: Normal in size, without focal abnormality. Adrenals/Urinary Tract: Normal adrenal glands. Bilateral renal collecting system calculi. An interpolar left renal too small to characterize lesion is likely a cyst. No hydronephrosis. Normal urinary bladder. Stomach/Bowel: Normal stomach, without wall thickening. Hartmann's pouch. Descending colostomy. 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 Mid abdominal small bowel loops, including in the region of the parastomal hernia, demonstrate increased pneumatosis, including on image 47/series 2. There is extraluminal mesenteric gas again identified, small volume including on image 62/series 2. Vascular/Lymphatic: Aortic atherosclerosis. Small bowel mesenteric adenopathy, including at 1.8 x 1.5 cm today versus 2.7 x 2.1 cm on the prior. No pelvic sidewall adenopathy. Reproductive: Normal prostate. Other: No significant free fluid. Extensive omental/ peritoneal metastasis. Index left pelvic lesion measures 3.2 x 2.2 cm on image 75/series 2. Compare 2.8 x 2.0 cm on the prior. Anterior abdominal wall metastasis measures 4.7 x 3.0 cm on image 56/series 2 versus 4.4 x 1.8 cm on the prior. Musculoskeletal: No acute osseous  abnormality. IMPRESSION: 1. Progression of metastatic disease within the lungs and peritoneum. 2. Mild improvement in hepatic and nodal metastasis. 3. 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 Progression of small bowel pneumatosis with mesenteric air since 05/13/2017. This is again suspicious for small bowel ischemia. 4. Distal esophageal wall thickening suggests esophagitis. 5. Bilateral nephrolithiasis. Electronically Signed   By: Abigail Miyamoto M.D.   On: 09/12/2017 17:07   Dg Chest Port 1 View  Result Date: 09/12/2017 CLINICAL DATA:  Diaphoresis. Altered mental status. Left lower quadrant abdominal pain. EXAM: PORTABLE CHEST 1 VIEW COMPARISON:  Chest, abdomen pelvis CT dated 05/13/2017. FINDINGS: Poor inspiration. Grossly normal sized heart. Stable mild linear scarring at the left lung base. The previously demonstrated pulmonary nodules are not well visualized today. Right jugular porta catheter tip in the inferior aspect of the superior vena cava. Unremarkable bones. IMPRESSION: No acute abnormality. Electronically Signed   By: Claudie Revering M.D.   On: 09/12/2017 15:16    EKG:   Orders placed or performed during the hospital encounter of 09/12/17  . EKG 12-Lead  . EKG 12-Lead  . ED EKG 12-Lead  . ED EKG 12-Lead  . ED EKG  . ED EKG  . EKG 12-Lead  . EKG 12-Lead  . EKG 12-Lead  . EKG 12-Lead    ASSESSMENT AND PLAN:   #1 abdominal pain acute on chronic with recurrent pneumatosis: Seen by surgery because of abdominal pain, pneumatosis.  Patient is not a surgical candidate because of extensive metastases.  And he feels better   2.  Sepsis with altered mental status:  Improved with IV fluids, IV antibiotics, follow blood cultures.  Neutropenic fever.  He is on Neupogen. Continue antibiotics and follow-up CBC.  3.  Metastatic colon cancer on palliative chemotherapy  Per [name], hold further chemotherapy. Palliative care consult.  4.Chronic pain  syndrome due to cancer: Patient is on fentanyl patch.  Oxycodone. . 5..Acute on chronic anemia likely dilutional anemia:  Follow-up hemoglobin and blood transfusion as needed.  All the records are reviewed and case discussed with Care Management/Social Workerr. Management plans discussed with the patient, family and they are in agreement.  CODE STATUS: DNR TOTAL TIME TAKING CARE OF THIS PATIENT: 33 minutes.   POSSIBLE D/C IN 2 DAYS, DEPENDING ON CLINICAL CONDITION.   Demetrios Loll M.D on 09/14/2017 at 2:27 PM  Between 7am to 6pm - Pager - [phone]  After 6pm go to [email or web address] - password [email or web address] [facility]  Office  [phone]  CC: Primary care physician; [name], MD   Note: This dictation was prepared with Dragon dictation along with smaller phrase technology. Any transcriptional errors that result from this process are unintentional.

## 2017-09-14 NOTE — Progress Notes (Signed)
[name]   DOB:[birth year]   DV#:[id number]    Subjective: Patient continues to feel poorly.  He continues to have abdominal pain.  However his mental status changes have improved.  Positive for nausea no vomiting.  He states this colostomy output is decreased but working.  No fever since yesterday morning. Feels depressed.   Objective:  Vitals:   09/14/17 0438 09/14/17 1348  BP: 115/63 131/78  Pulse: 96 96  Resp: 20 18  Temp: 99.2 F (37.3 C) 99.5 F (37.5 C)  SpO2: 97% 98%     Intake/Output Summary (Last 24 hours) at 09/14/2017 1352 Last data filed at 09/14/2017 0424 Gross per 24 hour  Intake 3999.17 ml  Output 1175 ml  Net 2824.17 ml    GENERAL Alert, no distress and comfortable.  Accompanied by his wife. Morbidly obese. EYES: no icterus OROPHARYNX: no thrush or ulceration. NECK: supple, no masses felt LYMPH:  no palpable lymphadenopathy in the cervical, axillary or inguinal regions LUNGS: decreased breath sounds to auscultation at bases and  No wheeze or crackles HEART/CVS: regular rate & rhythm and no murmurs; No lower extremity edema ABDOMEN: abdomen soft, tender  on deep palpation. and normal bowel sounds Musculoskeletal:no cyanosis of digits and no clubbing  PSYCH: alert & oriented x 3 with fluent speech NEURO: no focal motor/sensory deficits SKIN:  no rashes or significant lesions   Labs:  Lab Results  Component Value Date   WBC 1.0 (LL) 09/14/2017   HGB 7.5 (L) 09/14/2017   HCT 22.7 (L) 09/14/2017   MCV 94.8 09/14/2017   PLT 104 (L) 09/14/2017   NEUTROABS 0.9 (L) 09/12/2017    Lab Results  Component Value Date   NA 136 09/13/2017   K 3.5 09/13/2017   CL 106 09/13/2017   CO2 21 (L) 09/13/2017    Studies:  Ct Abdomen Pelvis W Contrast  Result Date: 09/12/2017 CLINICAL DATA:  Left lower quadrant pain. Tachycardia. Stage 4 colon cancer. EXAM: CT ABDOMEN AND PELVIS WITH CONTRAST TECHNIQUE: Multidetector CT imaging of the abdomen and pelvis was performed  using the standard protocol following bolus administration of intravenous contrast. CONTRAST:  <See Chart> ISOVUE-300 IOPAMIDOL (ISOVUE-300) INJECTION 61% COMPARISON:  05/13/2017 FINDINGS: Lower chest: Bibasilar lung nodules/metastasis. An index 1.3 cm right lower lobe nodule on image 2/series 4 is enlarged from 1.0 cm on the prior (when remeasured). Mild cardiomegaly, without pericardial or pleural effusion. Distal esophageal wall thickening is again identified. Hepatobiliary: Moderate hepatic steatosis. Hepatic metastasis. Index high right hepatic lobe lesion measures 3.2 cm today versus 3.6 cm on the prior. High right hepatic lobe 3.1 cm lesion image 20/series 2 measured 3.5 cm on the prior exam. Normal gallbladder, without biliary ductal dilatation. Pancreas: Fatty replacement throughout the pancreas. Spleen: Normal in size, without focal abnormality. Adrenals/Urinary Tract: Normal adrenal glands. Bilateral renal collecting system calculi. An interpolar left renal too small to characterize lesion is likely a cyst. No hydronephrosis. Normal urinary bladder. Stomach/Bowel: Normal stomach, without wall thickening. Hartmann's pouch. Descending colostomy. 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 Mid abdominal small bowel loops, including in the region of the parastomal hernia, demonstrate increased pneumatosis, including on image 47/series 2. There is extraluminal mesenteric gas again identified, small volume including on image 62/series 2. Vascular/Lymphatic: Aortic atherosclerosis. Small bowel mesenteric adenopathy, including at 1.8 x 1.5 cm today versus 2.7 x 2.1 cm on the prior. No pelvic sidewall adenopathy. Reproductive: Normal prostate. Other: No significant free fluid. Extensive omental/ peritoneal metastasis. Index left  pelvic lesion measures 3.2 x 2.2 cm on image 75/series 2. Compare 2.8 x 2.0 cm on the prior. Anterior abdominal wall metastasis measures 4.7 x 3.0 cm on image 56/series 2  versus 4.4 x 1.8 cm on the prior. Musculoskeletal: No acute osseous abnormality. IMPRESSION: 1. Progression of metastatic disease within the lungs and peritoneum. 2. Mild improvement in hepatic and nodal metastasis. 3. 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 Progression of small bowel pneumatosis with mesenteric air since 05/13/2017. This is again suspicious for small bowel ischemia. 4. Distal esophageal wall thickening suggests esophagitis. 5. Bilateral nephrolithiasis. Electronically Signed   By: Abigail Miyamoto M.D.   On: 09/12/2017 17:07   Dg Chest Port 1 View  Result Date: 09/12/2017 CLINICAL DATA:  Diaphoresis. Altered mental status. Left lower quadrant abdominal pain. EXAM: PORTABLE CHEST 1 VIEW COMPARISON:  Chest, abdomen pelvis CT dated 05/13/2017. FINDINGS: Poor inspiration. Grossly normal sized heart. Stable mild linear scarring at the left lung base. The previously demonstrated pulmonary nodules are not well visualized today. Right jugular porta catheter tip in the inferior aspect of the superior vena cava. Unremarkable bones. IMPRESSION: No acute abnormality. Electronically Signed   By: Claudie Revering M.D.   On: 09/12/2017 15:16    Assessment & Plan:   #57 year old male patient with metastatic colon cancer-admitted to the hospital for neutropenic fevers/worsening abdominal pain/mental status changes  #Metastatic colon cancer-on [place]-CT scan September 12, 2017-progressive disease in the peritoneum/lung; mild improvement in hepatic and nodal metastases; worsening small bowel pneumatosis [see discussion below].  Patient currently status post multiple lines of therapies-no good options available [can rechallenge oxaliplatin-however neuropathy].  Long discussion with patient and wife regarding discussed situation.  #Severe neutropenia/anemia-likely secondary to [place]; recommend discontinuation; in the context of progressive disease.  Continue Neupogen/growth factor  support.  Hemoglobin 7.5.  Monitor for now.  If continues get worse recommend PRBC transfusion.  #Small bowel pneumatosis-likely secondary to progressive malignancy; poor surgical candidate.  Discussed with surgery.  If surgery is recommended-he would prefer to have surgery at [facility].  Will discuss with patient's surgeon Dr. Excell Seltzer. Left a message for Dr.Hoxworth.  Agree with antibiotics and IV fluids.  #Pain control-continue fentanyl oxycodone for now.   #Had a long discussion with the patient/wife regarding-limited options; declining performance status-need to reevaluate goals of care.  Discussed regarding palliative care evaluation.  He is agreeable. Palliative care consult placed.   # 40 minutes face-to-face with the patient discussing the above plan of care; more than 50% of time spent on prognosis/ natural history; counseling and coordination.   Cammie Sickle, MD 09/14/2017  1:52 PM

## 2017-09-14 NOTE — Consult Note (Addendum)
Consultation Note Date: 09/14/2017   Patient Name: [name]  DOB: 1961/05/28  MRN: [id number]  Age / Sex: 57 y.o., male  PCP: [name], MD Referring Physician: Demetrios Loll, MD  Reason for Consultation: Establishing goals of care  HPI/Patient Profile:  This a patient with known metastatic colon cancer.  It is metastatic to lung peritoneum omentum and liver.  He presents with chronic and acute abdominal pain and recurrent pneumatosis.      Clinical Assessment and Goals of Care: Mr. Stegman is resting in bed. His wife is at bedside. They have been married 29 years. 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 They state he was diagnosed with cancer in 2016, and was placed on high dose chemo which, worked, but he remained sick and states his quality of life was horrible. He states they tried lower dose chemo which made his symptoms somewhat more tolerable, but the cancer spread. He states he has been "laying in bed" for 2 years. He is able to do ADL's but does not leave the house except for appointments.   We discussed diagnosis, prognosis, GOC, EOL wishes disposition and options.  A detailed discussion was had today regarding advanced directives.  Concepts specific to code status, artifical feeding and hydration, continued IV antibiotics and rehospitalization was discussed.  The difference between a aggressive medical intervention path  and a hospice comfort care path for this patient at this time was discussed.  Values and goals of care important to patient and family were attempted to be elicited. Natural trajectory and expectations at EOL were discussed.   He states he would like hospice care to follow at discharge, with desire for discharge home if able. Right now, he would like to continue current treatment to treat issues which are treatable,  with no escalation in care. His goal is to be able to eat and drink  normally as he was prior to this admission.     SUMMARY OF RECOMMENDATIONS Will continue to follow.     Continue current level of care during this admission with no escalation. Patient desires hospice care at home at discharge if able to discharge home.   Code Status/Advance Care Planning:  DNR    Palliative Prophylaxis:   Bowel Regimen and Oral Care    Prognosis:   < 6 months  Discharge Planning: Patient wants current level of care until discharge to be in the best place possible prior to leaving. Goal is to be able to eat and drink as he did prior to this admission.  Would like Hospice at discharge.       Primary Diagnoses: Present on Admission: . 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 The following aspects are pertinent.  Past Medical History:  Diagnosis Date  . Abdominal pain    mid abdomen  . Adiposity 03/21/2015  . Arthritis   . 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 Colon cancer ([place])   . Disorder of peripheral  nervous system 05/28/2014  . Extreme obesity 12/21/2012  . GERD (gastroesophageal reflux disease)   . History of kidney stones   . History of transfusion   . Hypertension    has been off BP med x 6 months  . Iron deficiency anemia due to chronic blood loss 05/08/2015  . LBP (low back pain) 05/28/2014  . Neuropathy   . Neutropenia ([place]) 09/13/2017  . PONV (postoperative nausea and vomiting)   . Sarcoid 10/2012  . Skin cancer    "it flairs up with sarcoidosis"   Social History   Socioeconomic History  . Marital status: Married    Spouse name: None  . Number of children: None  . Years of education: None  . Highest education level: None  Social Needs  . Financial resource strain: Not hard at all  . Food insecurity - worry: Never true  . Food insecurity - inability: Never true  . Transportation needs - medical: No  . Transportation needs - non-medical: No    Occupational History  . None  Tobacco Use  . Smoking status: Passive Smoke Exposure - Never Smoker  . Smokeless tobacco: Never Used  Substance and Sexual Activity  . Alcohol use: No  . Drug use: No  . Sexual activity: None  Other Topics Concern  . None  Social History Narrative  . None   Family History  Problem Relation Age of Onset  . Arthritis Mother   . Heart disease Mother   . Stroke Father   . Prostate cancer Neg Hx   . Bladder Cancer Neg Hx    Scheduled Meds: . fentaNYL  75 mcg Transdermal Q72H  . heparin  5,000 Units Subcutaneous Q8H  . pantoprazole (PROTONIX) IV  40 mg Intravenous QHS  . potassium chloride  40 mEq Oral Once  . sucralfate  1 g Oral TID WC & HS  . Tbo-Filgrastim  480 mcg Subcutaneous Daily   Continuous Infusions: . 0.9 % NaCl with KCl 20 mEq / L 100 mL/hr at 09/13/17 1944  . piperacillin-tazobactam (ZOSYN)  IV 3.375 g (09/14/17 1306)  . vancomycin Stopped (09/14/17 0955)   PRN Meds:.acetaminophen **OR** acetaminophen, lidocaine-prilocaine, LORazepam, morphine injection, ondansetron **OR** ondansetron (ZOFRAN) IV, oxyCODONE, oxyCODONE, promethazine, temazepam Medications Prior to Admission:  Prior to Admission medications   Medication Sig Start Date End Date Taking? Authorizing Provider  fentaNYL (DURAGESIC - DOSED MCG/HR) 75 MCG/HR Place 1 patch (75 mcg total) onto the skin every 3 (three) days. 09/07/17  Yes Verlon Au, NP  lidocaine-prilocaine (EMLA) cream Apply 1 application topically as needed. Apply small amount to port site at least 1 hour prior to it being accessed, cover with plastic wrap 06/20/17  Yes Cammie Sickle, MD  loperamide (IMODIUM A-D) 2 MG tablet Take 2 mg by mouth 4 (four) times daily as needed for diarrhea or loose stools.   Yes [provider]  LORazepam (ATIVAN) 0.5 MG tablet Take every 8 hours as needed for nausea/anxiety. 07/19/17  Yes Cammie Sickle, MD  omeprazole (PRILOSEC) 20 MG capsule Take 1  capsule (20 mg total) by mouth 2 (two) times daily before a meal. 08/15/17  Yes Cammie Sickle, MD  ondansetron (ZOFRAN) 8 MG tablet Take 1 tablet (8 mg total) by mouth every 8 (eight) hours as needed for nausea or vomiting. 08/30/17  Yes Cammie Sickle, MD  Oxycodone HCl 10 MG TABS Take 1 tablet (10 mg total) by mouth every 6 (six) hours as needed (severe pain).  09/07/17  Yes Verlon Au, NP  predniSONE (DELTASONE) 1 MG tablet Crush 1 tablet daily and mix with stoma paste and apply to wound 07/13/17  Yes Cammie Sickle, MD  promethazine (PHENERGAN) 25 MG tablet Take 1 tablet (25 mg total) by mouth every 6 (six) hours as needed. 06/24/17  Yes Cammie Sickle, MD  sucralfate (CARAFATE) 1 g tablet Take 1 tablet (1 g total) by mouth 4 (four) times daily. 06/15/17  Yes Cammie Sickle, MD  trifluridine-tipiracil (LONSURF) 20-8.19 MG tablet Take 4 tablets (80 mg trifluridine) by mouth two times daily after a meal. 1 hr after AM & PM meals on days 1-5, 8-12. Repeat every 28day 07/04/17  Yes Cammie Sickle, MD  zolpidem (AMBIEN) 10 MG tablet Take 10-15 mg by mouth at bedtime as needed for sleep.   Yes [provider]   Allergies  Allergen Reactions  . Versed [Midazolam] Nausea And Vomiting  . Gabapentin Other (See Comments)    Swallowing problems  . Paba Derivatives Nausea And Vomiting    Pt states he is allergic to unknown anesthesia. Pt has nausea and vomiting with anesthesia.   . Benzyl Benzoate Nausea And Vomiting    Pt states he is allergic to unknown anesthesia. Pt has nausea and vomiting with anesthesia.    Review of Systems  Constitutional: Positive for fatigue.  Neurological: Positive for weakness.    Physical Exam  Constitutional: No distress.  Neurological: He is alert.  Oriented  Skin: Skin is warm and dry.    Vital Signs: BP 131/78 (BP Location: Right Arm)   Pulse 96   Temp 99.5 F (37.5 C) (Oral)   Resp 18   Ht 5\' 11"   (1.803 m)   Wt 120.3 kg (265 lb 4.8 oz)   SpO2 98%   BMI 37.00 kg/m  Pain Assessment: 0-10 POSS *See Group Information*: S-Acceptable,Sleep, easy to arouse Pain Score: 7    SpO2: SpO2: 98 % O2 Device:SpO2: 98 % O2 Flow Rate: .   IO: Intake/output summary:   Intake/Output Summary (Last 24 hours) at 09/14/2017 1556 Last data filed at 09/14/2017 0424 Gross per 24 hour  Intake 3999.17 ml  Output 1175 ml  Net 2824.17 ml    LBM: Last BM Date: (colostomy pouch) Baseline Weight: Weight: 117.9 kg (260 lb) Most recent weight: Weight: 120.3 kg (265 lb 4.8 oz)     Palliative Assessment/Data: 50%     Time In: 3:20 Time Out: 4:30 Time Total: 50 min Greater than 50%  of this time was spent counseling and coordinating care related to the above assessment and plan.  Signed by: Asencion Gowda, NP   Please contact Palliative Medicine Team phone at [phone] for questions and concerns.  For individual provider: See Shea Evans

## 2017-09-14 NOTE — Progress Notes (Signed)
Advanced Care Plan.  Purpose of Encounter: Palliative care Parties in Attendance: The patient, his wife and me. Patient's Decisional Capacity: Yes. Medical Story: [name]  is a 57 y.o. 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 intake, decreased ostomy output, dizziness, confusion, nausea but no emesis.  He is admitted for sepsis, neutropenic fever and Metastatic colon cancer.  He was on palliative chemotherapy.  He has very poor prognosis.  I discussed with the patient and his wife about palliative care option.  They voiced understanding and agreed to get palliative care consult.  Plan: Palliative care consult. Code Status: DNR. Time spent discussing advance care planning. 17 minutes.

## 2017-09-15 LAB — BASIC METABOLIC PANEL
Anion gap: 5 (ref 5–15)
BUN: 11 mg/dL (ref 6–20)
CHLORIDE: 112 mmol/L — AB (ref 101–111)
CO2: 19 mmol/L — ABNORMAL LOW (ref 22–32)
CREATININE: 0.92 mg/dL (ref 0.61–1.24)
Calcium: 6.5 mg/dL — ABNORMAL LOW (ref 8.9–10.3)
GFR calc non Af Amer: >60 mL/min (ref >60)
Glucose, Bld: 88 mg/dL (ref 65–99)
POTASSIUM: 4.6 mmol/L (ref 3.5–5.1)
SODIUM: 136 mmol/L (ref 135–145)

## 2017-09-15 LAB — CBC
HEMATOCRIT: 19.9 % — AB (ref 40.0–52.0)
HEMOGLOBIN: 6.7 g/dL — AB (ref 13.0–18.0)
MCH: 32 pg (ref 26.0–34.0)
MCHC: 33.7 g/dL (ref 32.0–36.0)
MCV: 95 fL (ref 80.0–100.0)
Platelets: 87 10*3/uL — ABNORMAL LOW (ref 150–440)
RBC: 2.1 MIL/uL — AB (ref 4.40–5.90)
RDW: 20.3 % — ABNORMAL HIGH (ref 11.5–14.5)
WBC: 1.2 10*3/uL — CL (ref 3.8–10.6)

## 2017-09-15 LAB — MAGNESIUM: MAGNESIUM: 1.9 mg/dL (ref 1.7–2.4)

## 2017-09-15 LAB — PREPARE RBC (CROSSMATCH)

## 2017-09-15 LAB — ABO/RH: ABO/RH(D): O POS

## 2017-09-15 LAB — TROPONIN I: Troponin I: <0.03 ng/mL (ref <0.03)

## 2017-09-15 LAB — VANCOMYCIN, TROUGH: VANCOMYCIN TR: 14 ug/mL — AB (ref 15–20)

## 2017-09-15 MED ORDER — SODIUM CHLORIDE 0.9% FLUSH: 10.0000 mL | INTRAVENOUS | Status: DC | PRN

## 2017-09-15 MED ORDER — ENOXAPARIN SODIUM 40 MG/0.4ML ~~LOC~~ SOLN
40.0000 mg | SUBCUTANEOUS | Status: DC
  Administered 2017-09-16: 08:00:00 40 mg via SUBCUTANEOUS
  Filled 2017-09-15: qty 0.4

## 2017-09-15 MED ORDER — FUROSEMIDE 10 MG/ML IJ SOLN
20.0000 mg | Freq: Once | INTRAMUSCULAR | Status: AC
  Administered 2017-09-15: 09:00:00 20 mg via INTRAVENOUS
  Filled 2017-09-15: qty 2

## 2017-09-15 MED ORDER — ASPIRIN EC 325 MG PO TBEC
325.0000 mg | DELAYED_RELEASE_TABLET | ORAL | Status: AC
  Administered 2017-09-15: 17:00:00 325 mg via ORAL
  Filled 2017-09-15: qty 1

## 2017-09-15 MED ORDER — SODIUM CHLORIDE 0.9 % IV SOLN
Freq: Once | INTRAVENOUS | Status: AC
  Administered 2017-09-15: 13:00:00 via INTRAVENOUS

## 2017-09-15 MED ORDER — NITROGLYCERIN 0.4 MG SL SUBL
0.4000 mg | SUBLINGUAL_TABLET | SUBLINGUAL | Status: DC | PRN
Start: 2017-09-15 — End: 2017-09-16
  Administered 2017-09-15 (×2): 0.4 mg via SUBLINGUAL
  Filled 2017-09-15 (×2): qty 1

## 2017-09-15 MED ORDER — PANTOPRAZOLE SODIUM 40 MG PO TBEC
40.0000 mg | DELAYED_RELEASE_TABLET | Freq: Every day | ORAL | Status: DC
  Administered 2017-09-15 – 2017-09-16 (×2): 40 mg via ORAL
  Filled 2017-09-15 (×2): qty 1

## 2017-09-15 NOTE — Progress Notes (Signed)
[facility] [place] NAME: [name]    MR#:  [id number]  DATE OF BIRTH:  July 12, 1961  SUBJECTIVE: Patient admitted for abdominal pain, sepsis, noted to have fever, altered mental status Started on IV fluids, IV antibiotics and today his lactic acid level down, he feels better.  More alert than yesterday as per wife.  CHIEF COMPLAINT:   Chief Complaint  Patient presents with  . Altered Mental Status   No abd pain or fever, has generalized weakness. REVIEW OF SYSTEMS:   ROS CONSTITUTIONAL: no fever, but has generalized weakness. EYES: No blurred or double vision.  EARS, NOSE, AND THROAT: No tinnitus or ear pain.  RESPIRATORY: No cough, shortness of breath, wheezing or hemoptysis.  CARDIOVASCULAR: No chest pain, orthopnea, edema.  GASTROINTESTINAL: No nausea, vomiting, diarrhea decreased abdominal pain.  GENITOURINARY: No dysuria, hematuria.  ENDOCRINE: No polyuria, nocturia,  HEMATOLOGY: No anemia, easy bruising or bleeding SKIN: No rash or lesion. MUSCULOSKELETAL: No joint pain or arthritis.   NEUROLOGIC: No tingling, numbness, weakness.  PSYCHIATRY: No anxiety or depression.   DRUG ALLERGIES:   Allergies  Allergen Reactions  . Versed [Midazolam] Nausea And Vomiting  . Gabapentin Other (See Comments)    Swallowing problems  . Paba Derivatives Nausea And Vomiting    Pt states he is allergic to unknown anesthesia. Pt has nausea and vomiting with anesthesia.   . Benzyl Benzoate Nausea And Vomiting    Pt states he is allergic to unknown anesthesia. Pt has nausea and vomiting with anesthesia.     VITALS:  Blood pressure 126/63, pulse 90, temperature 97.6 F (36.4 C), temperature source Oral, resp. rate 18, height 5\' 11"  (1.803 m), weight 265 lb 4.8 oz (120.3 kg), SpO2 100 %.  PHYSICAL EXAMINATION:  GENERAL:  57 y.o.-year-old patient lying in the bed with no acute distress.  He is cachectic. EYES: Pupils equal, round,  reactive to light  No scleral icterus. Extraocular muscles intact.  HEENT: Head atraumatic, normocephalic. Oropharynx and nasopharynx clear.  NECK:  Supple, no jugular venous distention. No thyroid enlargement, no tenderness.  LUNGS: Normal breath sounds bilaterally, no wheezing, rales,rhonchi or crepitation. No use of accessory muscles of respiration.  CARDIOVASCULAR: S1, S2 normal. No murmurs, rubs, or gallops.  ABDOMEN: soft, no tenderness, BS present, colostomy bag in situ. EXTREMITIES: No pedal edema, cyanosis, or clubbing.  NEUROLOGIC: Cranial nerves II through XII are intact. Muscle strength 4/5 in all extremities. Sensation intact. Gait not checked.  PSYCHIATRIC: The patient is alert and oriented x 3.  SKIN: No obvious rash, lesion, or ulcer.    LABORATORY PANEL:   CBC Recent Labs  Lab 09/15/17 0217  WBC 1.2*  HGB 6.7*  HCT 19.9*  PLT 87*   ------------------------------------------------------------------------------------------------------------------  Chemistries  Recent Labs  Lab 09/13/17 0536  09/15/17 0217  NA 136  --  136  K 3.5  --  4.6  CL 106  --  112*  CO2 21*  --  19*  GLUCOSE 118*  --  88  BUN 10  --  11  CREATININE 1.02   < > 0.92  CALCIUM 7.5*  --  6.5*  MG  --   --  1.9  AST 27  --   --   ALT 18  --   --   ALKPHOS 83  --   --   BILITOT 2.1*  --   --    < > = values in this interval not  displayed.   ------------------------------------------------------------------------------------------------------------------  Cardiac Enzymes Recent Labs  Lab 09/12/17 1437  TROPONINI <0.03   ------------------------------------------------------------------------------------------------------------------  RADIOLOGY:  No results found.  EKG:   Orders placed or performed during the hospital encounter of 09/12/17  . EKG 12-Lead  . EKG 12-Lead  . ED EKG 12-Lead  . ED EKG 12-Lead  . ED EKG  . ED EKG  . EKG 12-Lead  . EKG 12-Lead  . EKG 12-Lead   . EKG 12-Lead    ASSESSMENT AND PLAN:   #1 abdominal pain acute on chronic with recurrent pneumatosis: Seen by surgery because of abdominal pain, pneumatosis.  Patient is not a surgical candidate because of extensive metastases.  And he feels better   2.  Sepsis with altered mental status:  Improved with IV fluids, IV antibiotics, negative blood culture so far.  Neutropenic fever.  He is on Neupogen. Continue antibiotics and follow-up CBC.  3.  Metastatic colon cancer on palliative chemotherapy  Per [name], hold further chemotherapy. Palliative care consult: Hospice care at skilled nursing facility.  4.Chronic pain syndrome due to cancer: Patient is on fentanyl patch.  Oxycodone. . 5..Acute on chronic anemia, no active bleeding, possible due to underlying disease and chemo. Hb down to 6.7, PRBC transfusion 2 units today, follow up Hb.  I discussed with [name]. All the records are reviewed and case discussed with Care Management/Social Workerr. Management plans discussed with the patient, family and they are in agreement.  CODE STATUS: DNR TOTAL TIME TAKING CARE OF THIS PATIENT: 33 minutes.   POSSIBLE D/C IN 2 DAYS, DEPENDING ON CLINICAL CONDITION.   Demetrios Loll M.D on 09/15/2017 at 1:25 PM  Between 7am to 6pm - Pager - [phone]  After 6pm go to [email or web address] - password [email or web address] [facility]  Office  [phone]  CC: Primary care physician; [name], MD   Note: This dictation was prepared with Dragon dictation along with smaller phrase technology. Any transcriptional errors that result from this process are unintentional.

## 2017-09-15 NOTE — Progress Notes (Signed)
Pt is complaining of chest pain, morphine ativan and zophran was given at the beginning of symptoms. MD and lab were notified due to blood infusing. Chest pain started 2hrs after the blood started infusing, md ordered stat troponin and ekg, ordered to continue with the blood infusion.

## 2017-09-15 NOTE — Progress Notes (Addendum)
The patient complains of chest pain on right side, sharp and 10/10, exacerbated by deep breaths.  No shortness of breath.  Blood pressure is normal. Physical examination: Tenderness on palpation on the right side of the chest, lung sounds are clear, no use of accessory muscles to breathe. Stat EKG: Sinus rhythm at 102, check troponin level, given aspirin 325 mg p.o. 1 dose, nitro as needed. Continue Protonix and Carafate.  Discussed with the patient and his wife and nurse.  Additional time spent, 35 minutes.

## 2017-09-15 NOTE — Progress Notes (Signed)
ANTIBIOTIC CONSULT NOTE - INITIAL   Pharmacy Consult for vancomycin Indication: FN  Allergies  Allergen Reactions  . Versed [Midazolam] Nausea And Vomiting  . Gabapentin Other (See Comments)    Swallowing problems  . Paba Derivatives Nausea And Vomiting    Pt states he is allergic to unknown anesthesia. Pt has nausea and vomiting with anesthesia.   . Benzyl Benzoate Nausea And Vomiting    Pt states he is allergic to unknown anesthesia. Pt has nausea and vomiting with anesthesia.     Patient Measurements: Height: 5\' 11"  (180.3 cm) Weight: 265 lb 4.8 oz (120.3 kg) IBW/kg (Calculated) : 75.3 Adjusted Body Weight:   Vital Signs: Temp: 99.9 F (37.7 C) (01/03 0436) Temp Source: Oral (01/03 0436) BP: 127/77 (01/03 0436) Pulse Rate: 100 (01/03 0436) Intake/Output from previous day: No intake/output data recorded. Intake/Output from this shift: No intake/output data recorded.  Labs: Recent Labs    09/13/17 0536 09/14/17 0805 09/14/17 1935 09/15/17 0217  WBC 0.8* 1.0*  --  1.2*  HGB 8.3* 7.5*  --  6.7*  PLT 110* 104*  --  87*  CREATININE 1.02  --  0.95 0.92   Estimated Creatinine Clearance: 116.9 mL/min (by C-G formula based on SCr of 0.92 mg/dL). Recent Labs    09/15/17 0217  [place] 14*     Microbiology: Recent Results (from the past 720 hour(s))  Culture, blood (Routine x 2)     Status: None (Preliminary result)   Collection Time: 09/12/17  2:37 PM  Result Value Ref Range Status   Specimen Description BLOOD Blood Culture adequate volume  Final   Special Requests BLOOD LEFT ARM  Final   Culture   Final    NO GROWTH 2 DAYS Performed at [facility], [address], [place], [place] [address]    Report Status PENDING  Incomplete  Culture, blood (Routine x 2)     Status: None (Preliminary result)   Collection Time: 09/12/17  2:37 PM  Result Value Ref Range Status   Specimen Description BLOOD Blood Culture adequate volume  Final   Special  Requests BLOOD RIGHT HAND  Final   Culture   Final    NO GROWTH 2 DAYS Performed at [facility], [address], [place], [place] [address]    Report Status PENDING  Incomplete  Urine culture     Status: None   Collection Time: 09/12/17  4:45 PM  Result Value Ref Range Status   Specimen Description   Final    URINE, RANDOM Performed at [facility], [address] [place], [place] [address]    Special Requests   Final    NONE Performed at [facility], [address] [place], [place] [address]    Culture   Final    NO GROWTH Performed at [facility], [place] [address] [place], [place] [address]    Report Status 09/14/2017 FINAL  Final  CULTURE, BLOOD (ROUTINE X 2) w Reflex to ID Panel     Status: None (Preliminary result)   Collection Time: 09/13/17  2:40 PM  Result Value Ref Range Status   Specimen Description BLOOD BLOOD LEFT HAND  Final   Special Requests   Final    BOTTLES DRAWN AEROBIC AND ANAEROBIC Blood Culture adequate volume   Culture   Final    NO GROWTH < 24 HOURS Performed at [facility], [address], [place], [place] [address]    Report Status PENDING  Incomplete  CULTURE, BLOOD (ROUTINE X 2) w Reflex to ID Panel     Status: None (Preliminary result)   Collection Time: 09/13/17  2:48 PM  Result Value Ref Range Status   Specimen Description BLOOD BLOOD RIGHT HAND  Final   Special Requests   Final    BOTTLES DRAWN AEROBIC AND ANAEROBIC Blood Culture results may not be optimal due to an excessive volume of blood received in culture bottles   Culture   Final    NO GROWTH < 24 HOURS Performed at [facility], [address], [place],  [address]    Report Status PENDING  Incomplete    Medical History: Past Medical History:  Diagnosis Date  . Abdominal pain    mid abdomen  . Adiposity 03/21/2015  . Arthritis   . 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 Colon cancer ([place])   . 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 Extreme obesity 12/21/2012  . GERD (gastroesophageal reflux disease)   . History of kidney stones   . History of transfusion   . Hypertension    has been off BP med x 6 months  . Iron deficiency anemia due to chronic blood loss 05/08/2015  . LBP (low back pain) 05/28/2014  . Neuropathy   . Neutropenia ([place]) 09/13/2017  . PONV (postoperative nausea and vomiting)   . Sarcoid 10/2012  . Skin cancer    "it flairs up with sarcoidosis"    Medications:  Infusions:  . 0.9 % NaCl with KCl 20 mEq / L 100 mL/hr at 09/13/17 1944  . piperacillin-tazobactam (ZOSYN)  IV Stopped (09/15/17 0117)  . vancomycin 1,000 mg (09/15/17 0323)   Assessment: 35 yom cc AMS with acute abdominal pain and chronic recurrent pneumatosis. Not a surgical candidate due to extensive metastases (metastatic colon cancer). Pharmacy consulted to dose vancomycin for febrile neutropenia.  Goal of Therapy:  Vancomycin trough level 15-20 mcg/ml  Plan:  Vancomycin 1 gm IV x 1 followed in approximately 6 hours by vancomycin 1 gm IV Q8H, predicted trough 15 mcg/mL. Pharmacy will continue to follow and adjust as needed to maintain trough 15 to 20 mcg/mL.   Vd 65.3 L, Ke 0.092 hr-1, T1/2 7.5 hr  01/03 @ 0217 VT 14 drawn 45 minutes late, true trough around 15 mcg/mL. Renal function actually improving. Will continue current regimen and will recheck next trough 1/4 @ 0200.  Tobie Lords, Pharm.D., BCPS Clinical Pharmacist 09/15/2017,5:00 AM

## 2017-09-16 ENCOUNTER — Other Ambulatory Visit: Payer: Self-pay

## 2017-09-16 ENCOUNTER — Telehealth: Payer: Self-pay | Admitting: Internal Medicine

## 2017-09-16 DIAGNOSIS — R112 Nausea with vomiting, unspecified: Secondary | ICD-10-CM

## 2017-09-16 DIAGNOSIS — K559 Vascular disorder of intestine, unspecified: Secondary | ICD-10-CM

## 2017-09-16 LAB — TYPE AND SCREEN
ABO/RH(D): O POS
ANTIBODY SCREEN: NEGATIVE
UNIT DIVISION: 0

## 2017-09-16 LAB — CBC
HEMATOCRIT: 21.4 % — AB (ref 40.0–52.0)
Hemoglobin: 7.2 g/dL — ABNORMAL LOW (ref 13.0–18.0)
MCH: 31.2 pg (ref 26.0–34.0)
MCHC: 33.5 g/dL (ref 32.0–36.0)
MCV: 93.4 fL (ref 80.0–100.0)
PLATELETS: 53 10*3/uL — AB (ref 150–440)
RBC: 2.29 MIL/uL — ABNORMAL LOW (ref 4.40–5.90)
RDW: 21.4 % — ABNORMAL HIGH (ref 11.5–14.5)
WBC: 3.3 10*3/uL — AB (ref 3.8–10.6)

## 2017-09-16 LAB — BPAM RBC
BLOOD PRODUCT EXPIRATION DATE: 201901092359
ISSUE DATE / TIME: 201901031300
UNIT TYPE AND RH: 9500

## 2017-09-16 LAB — VANCOMYCIN, TROUGH: VANCOMYCIN TR: 19 ug/mL (ref 15–20)

## 2017-09-16 MED ORDER — HEPARIN SOD (PORK) LOCK FLUSH 100 UNIT/ML IV SOLN
500.0000 [IU] | Freq: Once | INTRAVENOUS | Status: AC
  Administered 2017-09-16: 14:00:00 500 [IU] via INTRAVENOUS
  Filled 2017-09-16: qty 5

## 2017-09-16 MED ORDER — CIPROFLOXACIN HCL 500 MG PO TABS: 500.0000 mg | ORAL_TABLET | Freq: Two times a day (BID) | ORAL | 0 refills | Status: AC

## 2017-09-16 MED ORDER — OXYCODONE HCL 10 MG PO TABS: 10.0000 mg | ORAL_TABLET | Freq: Four times a day (QID) | ORAL | 0 refills | Status: DC | PRN

## 2017-09-16 MED ORDER — MORPHINE SULFATE (PF) 2 MG/ML IV SOLN
2.0000 mg | Freq: Once | INTRAVENOUS | Status: AC
  Administered 2017-09-16: 2 mg via INTRAVENOUS
  Filled 2017-09-16: qty 1

## 2017-09-16 MED ORDER — METRONIDAZOLE 500 MG PO TABS
500.0000 mg | ORAL_TABLET | Freq: Three times a day (TID) | ORAL | Status: DC
  Administered 2017-09-16: 500 mg via ORAL
  Filled 2017-09-16: qty 1

## 2017-09-16 MED ORDER — CIPROFLOXACIN HCL 500 MG PO TABS
500.0000 mg | ORAL_TABLET | Freq: Two times a day (BID) | ORAL | Status: DC
  Administered 2017-09-16: 08:00:00 500 mg via ORAL
  Filled 2017-09-16: qty 1

## 2017-09-16 MED ORDER — METRONIDAZOLE 500 MG PO TABS: 500.0000 mg | ORAL_TABLET | Freq: Three times a day (TID) | ORAL | 0 refills | Status: AC

## 2017-09-16 NOTE — Progress Notes (Signed)
New referral for [facility] services at home received from [place] following a  Palliative Medicine consult. Patient is a 57 year old man with metastatic colon  Cancer admitted to [facility] on 12/31 for treatment of acute on chronic abdominal pain. CT scan in the ED revealed increasing metastases in the lungs. Palliative medicine was consulted and met with patient and his wife today. They have decided to focus on comfort with discharge home with Hospice services. Writer met in the room with patient and his wife Lupita Dawn to intitiate education regarding hospice services, philosophy and team approach to care with good understanding voiced. No DME needs at time of discharge. Plan is for discharge home via car today. Hospice information and contact number left with Roxanne. Patient was alert and did participate in the conversation, he was experiencing pain, this was reported to staff RN Meghan. Patient information faxed to referral. Hospital care team updated. Signed DNR to accompany patient home. Flo Shanks RN, BSN, [place] and [facility] hospital Liaison [phone] c

## 2017-09-16 NOTE — Evaluation (Signed)
Physical Therapy Evaluation Patient Details Name: [name] MRN: [id number] DOB: October 15, 1960 Today's Date: 09/16/2017   History of Present Illness  Pt is a 58 y/o M who presented with acute on chronic abdominal pain with decreased p.o. intake, decreased ostomy output, dizziness, confusion, nausea, pain in L lower quadrant.  emergency room patient was found to have CT scan of the abdomen noted for increasing metastases in the lungs and peritoneum/parastomal hernia/increasing small bowel pneumatosis suspected secondary to ischemia/distal esophageal wall thickening concerning for esophagitis, EKG noted for anterior ST segment depression with heart rate in the 120s.  Pt had R sided chest pain on 1/3 with subsequent workup negative for cardiac causes.  Pt's PMH includes stage IV colon cancer with associated carcinomatosis, LBP, neuropathy, R portacath placement.     Clinical Impression  Pt admitted with above diagnosis. Pt currently with functional limitations due to the deficits listed below (see PT Problem List). [name] lethargic upon PT arrival as he was sleeping but was agreeable to working with therapy.  He currently requires min assist for bed mobility and sit<>stand transfers.  Pt ambulated 160 ft with RW, reporting significant fatigue following.  Instructed pt in therapeutic exercises to be completed while at hospital and upon d/c.  Encouraged pt to ambulate with nursing staff at least 3x/day for improved energy levels.   Pt will benefit from skilled PT to increase their independence and safety with mobility to allow discharge to the venue listed below.      Follow Up Recommendations Home health PT;Supervision/Assistance - 24 hour    Equipment Recommendations  None recommended by PT    Recommendations for Other Services       Precautions / Restrictions Precautions Precautions: Fall Restrictions Weight Bearing Restrictions: No      Mobility  Bed Mobility Overal bed mobility: Needs  Assistance Bed Mobility: Supine to Sit;Sit to Supine     Supine to sit: Min assist;HOB elevated Sit to supine: Min assist   General bed mobility comments: Min assist to elevate trunk.  Pt performs with increased effort. Min assist to bring LEs into bed for sit>supine  Transfers Overall transfer level: Needs assistance Equipment used: Rolling walker (2 wheeled) Transfers: Sit to/from Stand Sit to Stand: Min assist         General transfer comment: Min assist to boost to standing.  Pt with poorly controlled descent to sit.  Ambulation/Gait Ambulation/Gait assistance: Min guard Ambulation Distance (Feet): 160 Feet Assistive device: Rolling walker (2 wheeled) Gait Pattern/deviations: Step-through pattern;Decreased stride length;Narrow base of support;Staggering left;Staggering right Gait velocity: decreased Gait velocity interpretation: Below normal speed for age/gender General Gait Details: Pt ambualtes with a narrow BOS occassionally demonstrating crossover gait due to fatigue.  Pt unsteady at times staggering slightly L and R (attributes this to his neuropathy) but is able to steady without outside assist.  Pt reports significant fatigue after ambulating 160 ft.   Stairs            Wheelchair Mobility    Modified Rankin (Stroke Patients Only)       Balance Overall balance assessment: Needs assistance Sitting-balance support: No upper extremity supported;Feet supported Sitting balance-Leahy Scale: Good     Standing balance support: No upper extremity supported;During functional activity Standing balance-Leahy Scale: Fair Standing balance comment: Pt able to stand statically without UE support but benefits from RW to ambulate  Pertinent Vitals/Pain Pain Assessment: No/denies pain    Home Living Family/patient expects to be discharged to:: Private residence Living Arrangements: Spouse/significant other Available Help at  Discharge: Family;Available 24 hours/day Type of Home: House Home Access: Stairs to enter Entrance Stairs-Rails: Left;Right;Can reach both Entrance Stairs-Number of Steps: 3 Home Layout: Two level;Able to live on main level with bedroom/bathroom Home Equipment: Gilford Rile - 2 wheels;Cane - single point;Shower seat(suction grab bars in shower)      Prior Function Level of Independence: Needs assistance   Gait / Transfers Assistance Needed: Pt ambulating household distances without AD.  Denies any falls in the past 6 months.   ADL's / Homemaking Assistance Needed: Wife assists with sponge bath and dressing.  Wife does the cooking, cleaning, driving.         Hand Dominance        Extremity/Trunk Assessment   Upper Extremity Assessment Upper Extremity Assessment: Generalized weakness    Lower Extremity Assessment Lower Extremity Assessment: Generalized weakness       Communication   Communication: No difficulties  Cognition Arousal/Alertness: Awake/alert;Lethargic(pt had just received pain meds and was sleeping upon arrival) Behavior During Therapy: WFL for tasks assessed/performed Overall Cognitive Status: Within Functional Limits for tasks assessed                                        General Comments General comments (skin integrity, edema, etc.): Wife present during evaluation    Exercises General Exercises - Upper Extremity Shoulder Flexion: AROM;Both;10 reps;Seated General Exercises - Lower Extremity Ankle Circles/Pumps: AROM;Both;10 reps;Seated Long Arc Quad: AROM;Both;10 reps;Seated Hip Flexion/Marching: Both;10 reps;Seated   Assessment/Plan    PT Assessment Patient needs continued PT services  PT Problem List Decreased strength;Decreased activity tolerance;Decreased balance;Decreased mobility;Decreased knowledge of use of DME;Decreased safety awareness;Pain       PT Treatment Interventions DME instruction;Gait training;Stair  training;Functional mobility training;Therapeutic activities;Therapeutic exercise;Balance training;Neuromuscular re-education;Patient/family education    PT Goals (Current goals can be found in the Care Plan section)  Acute Rehab PT Goals Patient Stated Goal: to return home at d/c PT Goal Formulation: With patient Time For Goal Achievement: 09/30/17 Potential to Achieve Goals: Fair    Frequency Min 2X/week   Barriers to discharge        Co-evaluation               AM-PAC PT "6 Clicks" Daily Activity  Outcome Measure Difficulty turning over in bed (including adjusting bedclothes, sheets and blankets)?: A Lot Difficulty moving from lying on back to sitting on the side of the bed? : Unable Difficulty sitting down on and standing up from a chair with arms (e.g., wheelchair, bedside commode, etc,.)?: Unable Help needed moving to and from a bed to chair (including a wheelchair)?: A Little Help needed walking in hospital room?: A Little Help needed climbing 3-5 steps with a railing? : A Little 6 Click Score: 13    End of Session Equipment Utilized During Treatment: Gait belt Activity Tolerance: Patient limited by fatigue;Patient tolerated treatment well Patient left: in bed;with call bell/phone within reach;with family/visitor present Nurse Communication: Mobility status PT Visit Diagnosis: Muscle weakness (generalized) (M62.81);Unsteadiness on feet (R26.81);Other abnormalities of gait and mobility (R26.89)    Time: 8119-1478 PT Time Calculation (min) (ACUTE ONLY): 23 min   Charges:   PT Evaluation $PT Eval Moderate Complexity: 1 Mod PT Treatments $Gait Training: 8-22 mins  PT G Codes:        Collie Siad PT, DPT 09/16/2017, 11:19 AM

## 2017-09-16 NOTE — Telephone Encounter (Signed)
apts cnl per md request 

## 2017-09-16 NOTE — Progress Notes (Signed)
[name]   DOB:[birth year]   EG#:[id number]    Subjective: Patient resting comfortably in the morning.  Had a good night sleep.  However yesterday while getting blood transfusion "chest discomfort"; short of breath needing oxygen.  Subsequent workup negative for any cardiac causes.  He is tolerating oral diet.  Objective:  Vitals:   09/15/17 2034 09/16/17 0445  BP: (!) 117/54 119/62  Pulse: 93 91  Resp: 18 18  Temp: 99.6 F (37.6 C) 98.4 F (36.9 C)  SpO2: 95% 95%     Intake/Output Summary (Last 24 hours) at 09/16/2017 0828 Last data filed at 09/16/2017 7371 Gross per 24 hour  Intake 680 ml  Output 1275 ml  Net -595 ml    GENERAL Alert, no distress and comfortable.  Accompanied by his wife. EYES: no pallor or icterus OROPHARYNX: no thrush or ulceration. NECK: supple, no masses felt LYMPH:  no palpable lymphadenopathy in the cervical, axillary or inguinal regions LUNGS: decreased breath sounds to auscultation at bases and  No wheeze or crackles HEART/CVS: regular rate & rhythm and no murmurs; No lower extremity edema ABDOMEN: abdomen soft, tender  on deep palpation. and normal bowel sounds; positive for colostomy. Musculoskeletal:no cyanosis of digits and no clubbing  PSYCH: alert & oriented x 3 with fluent speech NEURO: no focal motor/sensory deficits SKIN:  no rashes or significant lesions   Labs:  Lab Results  Component Value Date   WBC 3.3 (L) 09/16/2017   HGB 7.2 (L) 09/16/2017   HCT 21.4 (L) 09/16/2017   MCV 93.4 09/16/2017   PLT 53 (L) 09/16/2017   NEUTROABS 0.9 (L) 09/12/2017    Lab Results  Component Value Date   NA 136 09/15/2017   K 4.6 09/15/2017   CL 112 (H) 09/15/2017   CO2 19 (L) 09/15/2017    Studies:  No results found.  Assessment & Plan:   57 year old male patient with metastatic colon cancer-admitted to the hospital for neutropenic fevers/worsening abdominal pain/mental status changes  # Metastatic colon cancer-on [place]-CT scan September 12, 2017-progressive disease in the peritoneum/lung; mild improvement in hepatic and nodal metastases; worsening small bowel pneumatosis [see discussion below].  Status post multiple lines of therapy-no good treatment options available.  Declining performance status/poor tolerance to therapy-recommend hospice.  Patient is interested in hospice at home.  #Severe neutropenia/anemia-likely secondary to [place]; recommend discontinuation; in the context of progressive disease.    Status post Neupogen white count 3. Hemoglobin 7.5-status post blood transfusion.  #Small bowel pneumatosis-likely secondary to progressive malignancy; poor surgical candidate.   Not a surgical candidate.  #Pain control-continue fentanyl oxycodone for now.   #Disposition-discussed with the patient's wife and patient in detail.  Interested in hospice at home.  Appreciate palliative care evaluation/recommendations.  Encouraged the patient's wife to talk to social worker regarding hospice needs at home.  Defer to Dr. Bridgett Larsson regarding timing of discharge.  Cammie Sickle, MD 09/16/2017  8:28 AM

## 2017-09-16 NOTE — Progress Notes (Addendum)
Daily Progress Note   Patient Name: [name]       Date: 09/16/2017 DOB: 12/01/1960  Age: 57 y.o. MRN#: [id number] Attending Physician: Demetrios Loll, MD Primary Care Physician: [name], MD Admit Date: 09/12/2017  Reason for Consultation/Follow-up: Establishing goals of care  Subjective: [name] is resting in bed. Dr. Bridgett Larsson in to speak with patient, discussing discharge. Patient maintains he would like hospice at home following discharge with no return to the hospital, and confirms no further desire for oncologic therapies.    MOST form completed for comfort, abx only as available with hospice, no IVF or feeding tube.   Length of Stay: 4  Current Medications: Scheduled Meds:  . ciprofloxacin  500 mg Oral BID  . enoxaparin (LOVENOX) injection  40 mg Subcutaneous Q24H  . fentaNYL  75 mcg Transdermal Q72H  . metroNIDAZOLE  500 mg Oral Q8H  . pantoprazole  40 mg Oral Daily  . potassium chloride  40 mEq Oral Once  . sucralfate  1 g Oral TID WC & HS  . Tbo-Filgrastim  480 mcg Subcutaneous Daily    Continuous Infusions:   PRN Meds: acetaminophen **OR** acetaminophen, lidocaine-prilocaine, LORazepam, morphine injection, nitroGLYCERIN, ondansetron **OR** ondansetron (ZOFRAN) IV, oxyCODONE, oxyCODONE, promethazine, sodium chloride flush, temazepam  Physical Exam  Constitutional: No distress.  Pulmonary/Chest: Effort normal.  Neurological: He is alert.  Oriented            Vital Signs: BP 119/62 (BP Location: Right Arm)   Pulse 91   Temp 98.4 F (36.9 C) (Oral)   Resp 18   Ht 5\' 11"  (1.803 m)   Wt 120.3 kg (265 lb 4.8 oz)   SpO2 95%   BMI 37.00 kg/m  SpO2: SpO2: 95 % O2 Device: O2 Device: Not Delivered O2 Flow Rate:    Intake/output summary:   Intake/Output Summary  (Last 24 hours) at 09/16/2017 1117 Last data filed at 09/16/2017 5643 Gross per 24 hour  Intake 680 ml  Output 975 ml  Net -295 ml   LBM: Last BM Date: (colostomy) Baseline Weight: Weight: 117.9 kg (260 lb) Most recent weight: Weight: 120.3 kg (265 lb 4.8 oz)       Palliative Assessment/Data: 40% at best    Flowsheet Rows     Most Recent Value  Intake Tab  Referral Department  Hospitalist  Unit at Time of Referral  Oncology Unit  Palliative Care Primary Diagnosis  Cancer  Date Notified  09/14/17  Palliative Care Type  New Palliative care  Reason for referral  Clarify Goals of Care  Date of Admission  09/12/17  Date first seen by Palliative Care  09/14/17  # of days Palliative referral response time  0 Day(s)  # of days IP prior to Palliative referral  2  Clinical Assessment  Psychosocial & Spiritual Assessment  Palliative Care Outcomes      Patient Active Problem List   Diagnosis Date Noted  . Neutropenia ([place]) 09/13/2017  . 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 Severe sepsis ([place])   . Dehydration 11/01/2016  . Counseling regarding goals of care 09/17/2016  . Bilateral sciatica   . Small bowel obstruction most likely due to recurrent carcinomatosis 08/07/2016  . 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 Iron deficiency anemia due to chronic blood loss 05/08/2015  . Morbid obesity ([place]) 05/07/2015  . Nausea and vomiting 05/07/2015  . Pulmonary sarcoidosis ([place])   . Abdominal pain 05/05/2015  . Leukocytosis 05/05/2015  . Hypokalemia 05/05/2015  . HTN (hypertension) 03/21/2015  . 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 Low back pain 05/28/2014  . Dry eye 09/25/2013  . Besnier-Boeck disease ([place]) 12/21/2012    Palliative Care Assessment & Plan   Patient Profile: This a patient with known metastatic colon cancer. It is metastatic to lung peritoneum omentum and  liver. He presents with chronic and acute abdominal pain and recurrent pneumatosis.    Assessment/Recommendations/Plan:  Patient is resting in bed, plans for D/C home today.   Hospice to follow at home.    Code Status:    Code Status Orders  (From admission, onward)        Start     Ordered   09/12/17 2053  Do not attempt resuscitation (DNR)  Continuous    Question Answer Comment  In the event of cardiac or respiratory ARREST Do not call a "code blue"   In the event of cardiac or respiratory ARREST Do not perform Intubation, CPR, defibrillation or ACLS   In the event of cardiac or respiratory ARREST Use medication by any route, position, wound care, and other measures to relive pain and suffering. May use oxygen, suction and manual treatment of airway obstruction as needed for comfort.      09/12/17 2052    Code Status History    Date Active Date Inactive Code Status Order ID Comments User Context   08/07/2016 23:01 08/18/2016 16:50 DNR [id number]  Lily Kocher, MD Inpatient   05/05/2015 18:14 05/11/2015 16:04 Full Code [id number]  Theodis Blaze, MD Inpatient   01/13/2015 17:07 01/16/2015 16:47 Full Code [id number]  Molly Maduro, MD Inpatient    Advance Directive Documentation     Most Recent Value  Type of Advance Directive  Living will  Pre-existing out of facility DNR order (yellow form or pink MOST form)  No data  "MOST" Form in Place?  No data       Prognosis:   < 6 months metastatic cancer, no further oncologic therapies. Decreased oral intake.   Discharge Planning:  Home with Hospice  Care plan was discussed with Dr. Bridgett Larsson  Thank you for allowing the Palliative Medicine Team to assist in the care of this patient.   Total Time 35 min Prolonged  Time Billed No      Greater than 50%  of this time was spent counseling and coordinating care related to the above assessment and plan.  Asencion Gowda, NP  Please contact Palliative Medicine Team phone at  [phone] for questions and concerns.

## 2017-09-16 NOTE — Progress Notes (Signed)
[name]   DOB:October 26, 1960   EN#:[id number]    Subjective: Patient is very tired.  Gets short of breath on walking the bathroom.  Intermittent abdominal pain.  Overall better controlled.  Mentation improved.  Objective:  Vitals:   09/15/17 2034 09/16/17 0445  BP: (!) 117/54 119/62  Pulse: 93 91  Resp: 18 18  Temp: 99.6 F (37.6 C) 98.4 F (36.9 C)  SpO2: 95% 95%     Intake/Output Summary (Last 24 hours) at 09/16/2017 0833 Last data filed at 09/16/2017 3614 Gross per 24 hour  Intake 680 ml  Output 1275 ml  Net -595 ml    GENERAL Alert, no distress and comfortable.  Accompanied by his wife. EYES: Positive for pallor no icterus OROPHARYNX: no thrush or ulceration. NECK: supple, no masses felt LYMPH:  no palpable lymphadenopathy in the cervical, axillary or inguinal regions LUNGS: decreased breath sounds to auscultation at bases and  No wheeze or crackles HEART/CVS: regular rate & rhythm and no murmurs; No lower extremity edema ABDOMEN: abdomen soft, tender  on deep palpation. and normal bowel sounds positive for colostomy. Musculoskeletal:no cyanosis of digits and no clubbing  PSYCH: alert & oriented x 3 with fluent speech NEURO: no focal motor/sensory deficits SKIN:  no rashes or significant lesions   Labs:  Lab Results  Component Value Date   WBC 3.3 (L) 09/16/2017   HGB 7.2 (L) 09/16/2017   HCT 21.4 (L) 09/16/2017   MCV 93.4 09/16/2017   PLT 53 (L) 09/16/2017   NEUTROABS 0.9 (L) 09/12/2017    Lab Results  Component Value Date   NA 136 09/15/2017   K 4.6 09/15/2017   CL 112 (H) 09/15/2017   CO2 19 (L) 09/15/2017    Studies:  No results found.  Assessment & Plan:   #57 year old male patient with metastatic colon cancer-admitted to the hospital for neutropenic fevers/worsening abdominal pain/mental status changes  #Metastatic colon cancer-on [place]ons[place]-CT scan September 12, 2017-progressive disease in the peritoneum/lung; mild improvement in hepatic and nodal  metastases; worsening small bowel pneumatosis [see discussion below].    Poor candidate for any further therapy/recommend hospice.  Appreciate palliative care evaluation.  #Severe neutropenia/anemia-likely secondary to [place]; recommend discontinuation; in the context of progressive disease.  Unfortunately did not get Neupogen yesterday. Continue Neupogen/growth factor support.    Hemoglobin 6.5 awaiting blood transfusion  #Small bowel pneumatosis-likely secondary to progressive malignancy; poor surgical candidate.  Discussed with surgery; Dr.Davis; and also discussed with patient's surgeon Dr. Excell Seltzer; who again feels patient is a poor candidate for surgery.  #Pain control-continue fentanyl oxycodone for now.   #Appreciate palliative care evaluation; do not suspect any significant escalation of care.  Discharge hospice at the patient's own home.  Discussed with Dr. Bridgett Larsson regarding advancing diet.   Cammie Sickle, MD

## 2017-09-16 NOTE — Progress Notes (Signed)
ANTIBIOTIC CONSULT NOTE - INITIAL   Pharmacy Consult for vancomycin Indication: FN  Allergies  Allergen Reactions  . Versed [Midazolam] Nausea And Vomiting  . Gabapentin Other (See Comments)    Swallowing problems  . Paba Derivatives Nausea And Vomiting    Pt states he is allergic to unknown anesthesia. Pt has nausea and vomiting with anesthesia.   . Benzyl Benzoate Nausea And Vomiting    Pt states he is allergic to unknown anesthesia. Pt has nausea and vomiting with anesthesia.     Patient Measurements: Height: 5\' 11"  (180.3 cm) Weight: 265 lb 4.8 oz (120.3 kg) IBW/kg (Calculated) : 75.3 Adjusted Body Weight:   Vital Signs: Temp: 99.6 F (37.6 C) (01/03 2034) Temp Source: Oral (01/03 2034) BP: 117/54 (01/03 2034) Pulse Rate: 93 (01/03 2034) Intake/Output from previous day: 01/03 0701 - 01/04 0700 In: 680 [Blood:680] Out: 975 [Urine:975] Intake/Output from this shift: Total I/O In: -  Out: 325 [Urine:325]  Labs: Recent Labs    09/13/17 0536 09/14/17 0805 09/14/17 1935 09/15/17 0217 09/16/17 0158  WBC 0.8* 1.0*  --  1.2* 3.3*  HGB 8.3* 7.5*  --  6.7* 7.2*  PLT 110* 104*  --  87* 53*  CREATININE 1.02  --  0.95 0.92  --    Estimated Creatinine Clearance: 116.9 mL/min (by C-G formula based on SCr of 0.92 mg/dL). Recent Labs    09/15/17 0217 09/16/17 0155  VANCOTROUGH 14* 91     Microbiology: Recent Results (from the past 720 hour(s))  Culture, blood (Routine x 2)     Status: None (Preliminary result)   Collection Time: 09/12/17  2:37 PM  Result Value Ref Range Status   Specimen Description BLOOD Blood Culture adequate volume  Final   Special Requests BLOOD LEFT ARM  Final   Culture   Final    NO GROWTH 3 DAYS Performed at [facility], [address], [place], [place] [address]    Report Status PENDING  Incomplete  Culture, blood (Routine x 2)     Status: None (Preliminary result)   Collection Time: 09/12/17  2:37 PM  Result Value Ref  Range Status   Specimen Description BLOOD Blood Culture adequate volume  Final   Special Requests BLOOD RIGHT HAND  Final   Culture   Final    NO GROWTH 3 DAYS Performed at [facility], [address], [place], [place] [address]    Report Status PENDING  Incomplete  Urine culture     Status: None   Collection Time: 09/12/17  4:45 PM  Result Value Ref Range Status   Specimen Description   Final    URINE, RANDOM Performed at [facility], [address], [place], [place] [address]    Special Requests   Final    NONE Performed at [facility], [address] [place], [place] [address]    Culture   Final    NO GROWTH Performed at [facility], [place] [address], [place], [place] [address]    Report Status 09/14/2017 FINAL  Final  CULTURE, BLOOD (ROUTINE X 2) w Reflex to ID Panel     Status: None (Preliminary result)   Collection Time: 09/13/17  2:40 PM  Result Value Ref Range Status   Specimen Description BLOOD BLOOD LEFT HAND  Final   Special Requests   Final    BOTTLES DRAWN AEROBIC AND ANAEROBIC Blood Culture adequate volume   Culture   Final    NO  GROWTH 2 DAYS Performed at [facility] , [address] [place], [place] [address]    Report Status PENDING  Incomplete  CULTURE, BLOOD (ROUTINE X 2) w Reflex to ID Panel     Status: None (Preliminary result)   Collection Time: 09/13/17  2:48 PM  Result Value Ref Range Status   Specimen Description BLOOD BLOOD RIGHT HAND  Final   Special Requests   Final    BOTTLES DRAWN AEROBIC AND ANAEROBIC Blood Culture results may not be optimal due to an excessive volume of blood received in culture bottles   Culture   Final    NO GROWTH 2 DAYS Performed at [facility], [address], [place], [place] [address]    Report Status PENDING  Incomplete    Medical History: Past Medical History:  Diagnosis Date  . Abdominal pain    mid abdomen  . Adiposity 03/21/2015  . Arthritis    . 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 Colon cancer ([place])   . 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 Extreme obesity 12/21/2012  . GERD (gastroesophageal reflux disease)   . History of kidney stones   . History of transfusion   . Hypertension    has been off BP med x 6 months  . Iron deficiency anemia due to chronic blood loss 05/08/2015  . LBP (low back pain) 05/28/2014  . Neuropathy   . Neutropenia ([place]) 09/13/2017  . PONV (postoperative nausea and vomiting)   . Sarcoid 10/2012  . Skin cancer    "it flairs up with sarcoidosis"    Medications:  Infusions:  . piperacillin-tazobactam (ZOSYN)  IV Stopped (09/16/17 0237)  . vancomycin Stopped (09/15/17 2126)   Assessment: 22 yom cc AMS with acute abdominal pain and chronic recurrent pneumatosis. Not a surgical candidate due to extensive metastases (metastatic colon cancer). Pharmacy consulted to dose vancomycin for febrile neutropenia.  Goal of Therapy:  Vancomycin trough level 15-20 mcg/ml  Plan:  Vancomycin 1 gm IV x 1 followed in approximately 6 hours by vancomycin 1 gm IV Q8H, predicted trough 15 mcg/mL. Pharmacy will continue to follow and adjust as needed to maintain trough 15 to 20 mcg/mL.   Vd 65.3 L, Ke 0.092 hr-1, T1/2 7.5 hr  01/03 @ 0217 VT 14 drawn 45 minutes late, true trough around 15 mcg/mL. Renal function actually improving. Will continue current regimen and will recheck next trough 1/4 @ 0200.  01/04 @ 0200 VT 19 therapeutic, but drawn 2.5 hours too early. True trough 15 mcg/mL when back extrapolating. Renal function is stable UOP over past 24 1.4L. Will continue current dose and will recheck VT 1/7 @ 0200.  Tobie Lords, Pharm.D., BCPS Clinical Pharmacist 09/16/2017,3:45 AM

## 2017-09-16 NOTE — Discharge Summary (Signed)
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 Abdominal pain    mid abdomen  . Adiposity 03/21/2015  . Arthritis   . 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 Colon cancer ([place])   . 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 Extreme obesity 12/21/2012  . GERD (gastroesophageal reflux disease)   . History of kidney stones   . History of transfusion   . Hypertension    has been off BP med x 6 months  . Iron deficiency anemia due to chronic blood loss 05/08/2015  . LBP (low back pain) 05/28/2014  . Neuropathy   . Neutropenia ([place]) 09/13/2017  . PONV (postoperative nausea and vomiting)   . Sarcoid 10/2012  . Skin cancer    "it flairs up with sarcoidosis"   HOSPITAL COURSE:   #1 abdominal pain acute on chronic with recurrent pneumatosis: Seen by surgery because of abdominal pain, pneumatosis.  Patient is not a surgical candidate because of extensive metastases. He feels better and tolerated diet.  2.  Sepsis with altered mental status:  Improved with IV fluids, IV antibiotics including vancomycin and Zosyn, negative blood culture so far.  Change to p.o. Cipro and Flagyl for 1 week per [name].  Neutropenic fever.  He is on Neupogen.  Neutropenia improved.  3.   Metastatic colon cancer on palliative chemotherapy  Per [name], hold further chemotherapy. Palliative care consult: Hospice care at home.  4.Chronic pain syndrome due to cancer: Patient is on fentanyl patch.  Oxycodone. . 5..Acute on chronic anemia, no active bleeding, possible due to underlying disease and chemo. Hb down to 6.7, s/p PRBC transfusion 2 units, Hb is up to 7.2.  Typical chest pain, due to muscle skeletal etiology.  The patient had one episode of chest pain on the right side yesterday.  Troponin is normal and EKG is unremarkable.  I discussed with palliative care nurse practitioner Crystal about hospice care at home. DISCHARGE CONDITIONS:  Poor prognosis, discharged to home with hospice care today. CONSULTS OBTAINED:  Treatment Team:  Lequita Asal, MD DRUG ALLERGIES:   Allergies  Allergen Reactions  . Versed [Midazolam] Nausea And Vomiting  . Gabapentin Other (See Comments)    Swallowing problems  . Paba Derivatives Nausea And Vomiting    Pt states he is allergic to unknown anesthesia. Pt has nausea and vomiting with anesthesia.   . Benzyl Benzoate Nausea And Vomiting    Pt states he is allergic to unknown anesthesia. Pt has nausea and vomiting with anesthesia.    DISCHARGE MEDICATIONS:   Allergies as of 09/16/2017      Reactions   Versed [midazolam] Nausea And Vomiting   Gabapentin Other (See Comments)   Swallowing problems   Paba Derivatives Nausea And Vomiting   Pt states he is allergic to unknown anesthesia. Pt has nausea and  vomiting with anesthesia.    Benzyl Benzoate Nausea And Vomiting   Pt states he is allergic to unknown anesthesia. Pt has nausea and vomiting with anesthesia.       Medication List    STOP taking these medications   trifluridine-tipiracil 20-8.19 MG tablet Commonly known as:  LONSURF     TAKE these medications   ciprofloxacin 500 MG tablet Commonly known as:  CIPRO Take 1 tablet (500 mg total) by mouth 2 (two)  times daily.   fentaNYL 75 MCG/HR Commonly known as:  DURAGESIC - dosed mcg/hr Place 1 patch (75 mcg total) onto the skin every 3 (three) days.   IMODIUM A-D 2 MG tablet Generic drug:  loperamide Take 2 mg by mouth 4 (four) times daily as needed for diarrhea or loose stools.   lidocaine-prilocaine cream Commonly known as:  EMLA Apply 1 application topically as needed. Apply small amount to port site at least 1 hour prior to it being accessed, cover with plastic wrap   LORazepam 0.5 MG tablet Commonly known as:  ATIVAN Take every 8 hours as needed for nausea/anxiety.   metroNIDAZOLE 500 MG tablet Commonly known as:  FLAGYL Take 1 tablet (500 mg total) by mouth every 8 (eight) hours.   omeprazole 20 MG capsule Commonly known as:  PRILOSEC Take 1 capsule (20 mg total) by mouth 2 (two) times daily before a meal.   ondansetron 8 MG tablet Commonly known as:  ZOFRAN Take 1 tablet (8 mg total) by mouth every 8 (eight) hours as needed for nausea or vomiting.   Oxycodone HCl 10 MG Tabs Take 1 tablet (10 mg total) by mouth every 6 (six) hours as needed (severe pain). Change to every 4 hours as needed for severe pain. What changed:  additional instructions   predniSONE 1 MG tablet Commonly known as:  DELTASONE Crush 1 tablet daily and mix with stoma paste and apply to wound   promethazine 25 MG tablet Commonly known as:  PHENERGAN Take 1 tablet (25 mg total) by mouth every 6 (six) hours as needed.   sucralfate 1 g tablet Commonly known as:  CARAFATE Take 1 tablet (1 g total) by mouth 4 (four) times daily.   zolpidem 10 MG tablet Commonly known as:  AMBIEN Take 10-15 mg by mouth at bedtime as needed for sleep.        DISCHARGE INSTRUCTIONS:  See AVS.  If you experience worsening of your admission symptoms, develop shortness of breath, life threatening emergency, suicidal or homicidal thoughts you must seek medical attention immediately by calling 911 or calling your MD  immediately  if symptoms less severe.  You Must read complete instructions/literature along with all the possible adverse reactions/side effects for all the Medicines you take and that have been prescribed to you. Take any new Medicines after you have completely understood and accpet all the possible adverse reactions/side effects.   Please note  You were cared for by a hospitalist during your hospital stay. If you have any questions about your discharge medications or the care you received while you were in the hospital after you are discharged, you can call the unit and asked to speak with the hospitalist on call if the hospitalist that took care of you is not available. Once you are discharged, your primary care physician will handle any further medical issues. Please note that NO REFILLS for any discharge medications will be authorized once you are discharged, as it is imperative that you return to  your primary care physician (or establish a relationship with a primary care physician if you do not have one) for your aftercare needs so that they can reassess your need for medications and monitor your lab values.    On the day of Discharge:  VITAL SIGNS:  Blood pressure 117/60, pulse 89, temperature 98 F (36.7 C), temperature source Oral, resp. rate 18, height 5\' 11"  (1.803 m), weight 265 lb 4.8 oz (120.3 kg), SpO2 97 %. PHYSICAL EXAMINATION:  GENERAL:  57 y.o.-year-old patient lying in the bed with no acute distress. Obese. EYES: Pupils equal, round, reactive to light and accommodation. No scleral icterus. Extraocular muscles intact.  HEENT: Head atraumatic, normocephalic. Oropharynx and nasopharynx clear.  NECK:  Supple, no jugular venous distention. No thyroid enlargement, no tenderness.  LUNGS: Normal breath sounds bilaterally, no wheezing, rales,rhonchi or crepitation. No use of accessory muscles of respiration.  CARDIOVASCULAR: S1, S2 normal. No murmurs, rubs, or gallops.  ABDOMEN:  Soft, non-tender, mild distended. Bowel sounds present. No organomegaly or mass.  EXTREMITIES: No pedal edema, cyanosis, or clubbing.  NEUROLOGIC: Cranial nerves II through XII are intact. Muscle strength 4/5 in all extremities. Sensation intact. Gait not checked.  PSYCHIATRIC: The patient is alert and oriented x 3.  SKIN: No obvious rash, lesion, or ulcer.  DATA REVIEW:   CBC Recent Labs  Lab 09/16/17 0158  WBC 3.3*  HGB 7.2*  HCT 21.4*  PLT 53*    Chemistries  Recent Labs  Lab 09/13/17 0536  09/15/17 0217  NA 136  --  136  K 3.5  --  4.6  CL 106  --  112*  CO2 21*  --  19*  GLUCOSE 118*  --  88  BUN 10  --  11  CREATININE 1.02   < > 0.92  CALCIUM 7.5*  --  6.5*  MG  --   --  1.9  AST 27  --   --   ALT 18  --   --   ALKPHOS 83  --   --   BILITOT 2.1*  --   --    < > = values in this interval not displayed.     Microbiology Results  Results for orders placed or performed during the hospital encounter of 09/12/17  Culture, blood (Routine x 2)     Status: None (Preliminary result)   Collection Time: 09/12/17  2:37 PM  Result Value Ref Range Status   Specimen Description BLOOD Blood Culture adequate volume  Final   Special Requests BLOOD LEFT ARM  Final   Culture   Final    NO GROWTH 4 DAYS Performed at [facility], [address] [place], [place] [address]    Report Status PENDING  Incomplete  Culture, blood (Routine x 2)     Status: None (Preliminary result)   Collection Time: 09/12/17  2:37 PM  Result Value Ref Range Status   Specimen Description BLOOD Blood Culture adequate volume  Final   Special Requests BLOOD RIGHT HAND  Final   Culture   Final    NO GROWTH 4 DAYS Performed at [facility], [address] [place], [place] [address]    Report Status PENDING  Incomplete  Urine culture     Status: None   Collection Time: 09/12/17  4:45 PM  Result Value Ref Range Status   Specimen Description   Final    URINE, RANDOM Performed  at [facility], [address] [place],  [place] [address]    Special Requests   Final    NONE Performed at [facility], [address] [place], [place] [address]    Culture   Final    NO GROWTH Performed at [facility], [place] [address], [place], [place] [address]    Report Status 09/14/2017 FINAL  Final  CULTURE, BLOOD (ROUTINE X 2) w Reflex to ID Panel     Status: None (Preliminary result)   Collection Time: 09/13/17  2:40 PM  Result Value Ref Range Status   Specimen Description BLOOD BLOOD LEFT HAND  Final   Special Requests   Final    BOTTLES DRAWN AEROBIC AND ANAEROBIC Blood Culture adequate volume   Culture   Final    NO GROWTH 3 DAYS Performed at [facility], [address], [place], [place] [address]    Report Status PENDING  Incomplete  CULTURE, BLOOD (ROUTINE X 2) w Reflex to ID Panel     Status: None (Preliminary result)   Collection Time: 09/13/17  2:48 PM  Result Value Ref Range Status   Specimen Description BLOOD BLOOD RIGHT HAND  Final   Special Requests   Final    BOTTLES DRAWN AEROBIC AND ANAEROBIC Blood Culture results may not be optimal due to an excessive volume of blood received in culture bottles   Culture   Final    NO GROWTH 3 DAYS Performed at [facility], [address] [place], [place] [address]    Report Status PENDING  Incomplete    RADIOLOGY:  No results found.   Management plans discussed with the patient, his wife and they are in agreement.  CODE STATUS: DNR   TOTAL TIME TAKING CARE OF THIS PATIENT: 38 minutes.    Demetrios Loll M.D on 09/16/2017 at 3:22 PM  Between 7am to 6pm - Pager - [phone]  After 6pm go to [email or web address] - Proofreader  [facility]  Office  [phone]  CC: Primary care physician; [name], MD   Note: This dictation was prepared with Dragon dictation along with smaller phrase technology. Any transcriptional errors that  result from this process are unintentional.

## 2017-09-16 NOTE — Progress Notes (Signed)
PT Cancellation Note  Patient Details Name: BRENT TAILLON MRN: [id number] DOB: [birth year]   Cancelled Treatment:    Reason Eval/Treat Not Completed: Other (comment).  Received discontinue orders for PT as pt has decided to go home with hospice without return to hospital.  PT will sign off at this time.   Collie Siad PT, DPT 09/16/2017, 12:51 PM

## 2017-09-16 NOTE — Care Management (Signed)
Chart reviewed and palliative care note reviewed. Spoke with wife and she wants hospice in the home. Offered choice of hospice agencies and she chooses [facility].

## 2017-09-16 NOTE — Discharge Instructions (Signed)
Hospice care

## 2017-09-16 NOTE — Telephone Encounter (Signed)
Please cancel the patient appointment for January 7th-as patient is going on hospice at home.  Follow-up will be as needed with me. [email or web address]

## 2017-09-16 NOTE — Progress Notes (Signed)
Pt is being discharged today, discharge instructions reviewed with the patient and wife, both verified understanding. 2 paper prescriptions were given to them. Port was flushed with heparin and deaccessed. All belongings packed and returned to the patient . He was rolled out in a wheelchair by staff.

## 2017-09-17 LAB — CULTURE, BLOOD (ROUTINE X 2)
Culture: NO GROWTH
Culture: NO GROWTH
SPECIMEN DESCRIPTION: ADEQUATE
SPECIMEN DESCRIPTION: ADEQUATE

## 2017-09-18 LAB — CULTURE, BLOOD (ROUTINE X 2)
Culture: NO GROWTH
Culture: NO GROWTH
SPECIAL REQUESTS: ADEQUATE

## 2017-09-19 ENCOUNTER — Inpatient Hospital Stay: Payer: BLUE CROSS/BLUE SHIELD | Admitting: Internal Medicine

## 2017-09-19 ENCOUNTER — Other Ambulatory Visit: Payer: BLUE CROSS/BLUE SHIELD

## 2017-09-19 ENCOUNTER — Ambulatory Visit: Payer: BLUE CROSS/BLUE SHIELD | Admitting: Internal Medicine

## 2017-09-19 ENCOUNTER — Telehealth: Payer: Self-pay | Admitting: *Deleted

## 2017-09-19 ENCOUNTER — Inpatient Hospital Stay: Payer: BLUE CROSS/BLUE SHIELD

## 2017-09-19 NOTE — Telephone Encounter (Signed)
Advised that appointment has been cancelled

## 2017-09-19 NOTE — Telephone Encounter (Signed)
Wife called asking if he really needs to come to Friday follow up appointment.

## 2017-09-21 ENCOUNTER — Ambulatory Visit: Payer: BLUE CROSS/BLUE SHIELD | Admitting: Internal Medicine

## 2017-09-21 ENCOUNTER — Other Ambulatory Visit: Payer: BLUE CROSS/BLUE SHIELD

## 2017-09-23 ENCOUNTER — Ambulatory Visit: Payer: BLUE CROSS/BLUE SHIELD | Admitting: Internal Medicine

## 2017-09-26 ENCOUNTER — Other Ambulatory Visit: Payer: Self-pay | Admitting: *Deleted

## 2017-09-26 DIAGNOSIS — C772 Secondary and unspecified malignant neoplasm of intra-abdominal lymph nodes: Principal | ICD-10-CM

## 2017-09-26 DIAGNOSIS — C186 Malignant neoplasm of descending colon: Secondary | ICD-10-CM

## 2017-09-26 DIAGNOSIS — G893 Neoplasm related pain (acute) (chronic): Secondary | ICD-10-CM

## 2017-09-26 MED ORDER — OXYCODONE HCL 10 MG PO TABS: 10.0000 mg | ORAL_TABLET | Freq: Four times a day (QID) | ORAL | 0 refills | Status: DC | PRN

## 2017-09-28 ENCOUNTER — Ambulatory Visit: Payer: BLUE CROSS/BLUE SHIELD | Admitting: Internal Medicine

## 2017-09-28 ENCOUNTER — Other Ambulatory Visit: Payer: BLUE CROSS/BLUE SHIELD

## 2017-10-04 ENCOUNTER — Telehealth: Payer: Self-pay | Admitting: *Deleted

## 2017-10-04 NOTE — Telephone Encounter (Signed)
Hospice nurse called reporting that patient is having increased abdominal pain and that they have actually given him some Roxanol. She is asking for an order to increase his fentanyl patch from 75 mcg Please advise

## 2017-10-05 MED ORDER — FENTANYL 25 MCG/HR TD PT72: 25.0000 ug | MEDICATED_PATCH | TRANSDERMAL | 0 refills | Status: DC

## 2017-10-05 NOTE — Telephone Encounter (Signed)
Sarah informed increase in Fentanyl to 100 mcg and that 5 Fentanyl 25 mcg patches sent to pharmacy

## 2017-10-06 ENCOUNTER — Other Ambulatory Visit: Payer: Self-pay | Admitting: *Deleted

## 2017-10-06 MED ORDER — MORPHINE SULFATE (CONCENTRATE) 20 MG/ML PO SOLN: ORAL | 0 refills | Status: AC

## 2017-10-08 ENCOUNTER — Telehealth: Payer: Self-pay | Admitting: Oncology

## 2017-10-08 NOTE — Telephone Encounter (Signed)
Hospice RN Otila Kluver called and reported patient has nausea vomiting, took Phenergan suppository, not working. Can't take zofran Tablets.  Verbal order given for Zofran ODT 8mg  PRN Rx.

## 2017-10-10 ENCOUNTER — Other Ambulatory Visit: Payer: Self-pay | Admitting: *Deleted

## 2017-10-10 DIAGNOSIS — C772 Secondary and unspecified malignant neoplasm of intra-abdominal lymph nodes: Principal | ICD-10-CM

## 2017-10-10 DIAGNOSIS — G893 Neoplasm related pain (acute) (chronic): Secondary | ICD-10-CM

## 2017-10-10 DIAGNOSIS — C186 Malignant neoplasm of descending colon: Secondary | ICD-10-CM

## 2017-10-10 MED ORDER — OXYCODONE HCL 10 MG PO TABS: 10.0000 mg | ORAL_TABLET | Freq: Four times a day (QID) | ORAL | 0 refills | Status: DC | PRN

## 2017-10-17 ENCOUNTER — Other Ambulatory Visit: Payer: Self-pay | Admitting: *Deleted

## 2017-10-17 MED ORDER — FENTANYL 100 MCG/HR TD PT72: 100.0000 ug | MEDICATED_PATCH | TRANSDERMAL | 0 refills | Status: DC

## 2017-10-21 ENCOUNTER — Other Ambulatory Visit: Payer: Self-pay | Admitting: *Deleted

## 2017-10-21 DIAGNOSIS — C772 Secondary and unspecified malignant neoplasm of intra-abdominal lymph nodes: Principal | ICD-10-CM

## 2017-10-21 DIAGNOSIS — G893 Neoplasm related pain (acute) (chronic): Secondary | ICD-10-CM

## 2017-10-21 DIAGNOSIS — C186 Malignant neoplasm of descending colon: Secondary | ICD-10-CM

## 2017-10-21 NOTE — Telephone Encounter (Signed)
Requesting refill of Oxycodone which is not due until Monday

## 2017-10-24 ENCOUNTER — Other Ambulatory Visit: Payer: Self-pay | Admitting: Internal Medicine

## 2017-10-24 MED ORDER — OXYCODONE HCL 10 MG PO TABS: 10.0000 mg | ORAL_TABLET | Freq: Four times a day (QID) | ORAL | 0 refills | Status: AC | PRN

## 2017-10-31 ENCOUNTER — Other Ambulatory Visit: Payer: Self-pay | Admitting: *Deleted

## 2017-10-31 MED ORDER — FENTANYL 100 MCG/HR TD PT72: 100.0000 ug | MEDICATED_PATCH | TRANSDERMAL | 0 refills | Status: AC

## 2017-11-11 DEATH — deceased

## 2018-02-17 IMAGING — PT NM PET TUM IMG RESTAG (PS) SKULL BASE T - THIGH
1 of 10 series · 1 of 25 positions shown · non-contrast
Comparison: Multiple exams, including 11/24/2015

CLINICAL DATA: Subsequent treatment strategy for colon cancer..

EXAM:
NUCLEAR MEDICINE PET SKULL BASE TO THIGH
TECHNIQUE: 12.2 mCi F-18 FDG was injected intravenously. Full-ring PET imaging
was performed from the skull base to thigh after the radiotracer. CT
data was obtained and used for attenuation correction and anatomic
localization.
FASTING BLOOD GLUCOSE:  Value: 99 mg/dl

[Series 4: ct wb 5.0 b30f · axial · 5.0mm · 0.98mm/px · 1 of 329 slices shown]
[im 329/329  brain]
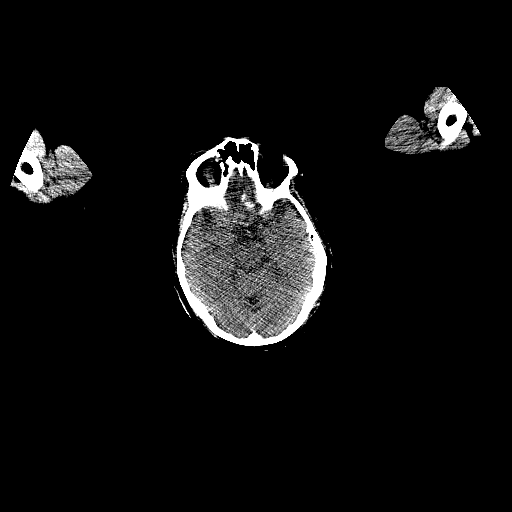

[1 of 25 positions shown; findings below may reference images not displayed]

FINDINGS: NECK

No hypermetabolic lymph nodes in the neck.

CHEST

Multiple new pulmonary nodules are present. A superior segment right
lower lobe pulmonary nodule measuring 1.4 by 1.1 cm on image 81/4
has maximum standard uptake value with a maximum SUV of 3.8.
Coronary atherosclerosis.

ABDOMEN/PELVIS

Numerous new masses are present throughout all segments of the
liver. A representative mass in the right hepatic lobe measuring
approximately 3 cm in diameter has a maximum SUV of 22.6. Overall
large metastatic burden to the liver.

Diffuse hepatic steatosis. Bilateral nonobstructive nephrolithiasis.
Left colostomy.

Peritoneal/omental and mesenteric implants of tumor present. Some of
these are within or along the anterior abdominal wall.
Hypermetabolic mass along and within the tip of what appears to be a
Hartmann's pouch, maximum SUV in this area 20.8.

A central mesenteric tumor measuring 3.7 by 2.7 cm has a maximum SUV
of

SKELETON

No focal hypermetabolic activity to suggest skeletal metastasis.
IMPRESSION: 1. Unfortunately there is new metastatic disease to the right lung,
extensive metastatic disease to the liver, and metastatic disease to
the omentum, peritoneal margin, and mesentery.
2. Other imaging findings of potential clinical significance:
Coronary atherosclerosis.
3. Bilateral nonobstructive nephrolithiasis.
